# Patient Record
Sex: Female | Born: 1975
Health system: Southern US, Community
[De-identification: ages and names within clinical notes are randomized; demographics above are authoritative.]

## PROBLEM LIST (undated history)

## (undated) DIAGNOSIS — K5790 Diverticulosis of intestine, part unspecified, without perforation or abscess without bleeding: Secondary | ICD-10-CM

## (undated) DIAGNOSIS — I10 Essential (primary) hypertension: Secondary | ICD-10-CM

## (undated) DIAGNOSIS — K219 Gastro-esophageal reflux disease without esophagitis: Secondary | ICD-10-CM

## (undated) DIAGNOSIS — F4310 Post-traumatic stress disorder, unspecified: Secondary | ICD-10-CM

## (undated) DIAGNOSIS — M1990 Unspecified osteoarthritis, unspecified site: Secondary | ICD-10-CM

## (undated) DIAGNOSIS — R87629 Unspecified abnormal cytological findings in specimens from vagina: Secondary | ICD-10-CM

## (undated) DIAGNOSIS — E119 Type 2 diabetes mellitus without complications: Secondary | ICD-10-CM

## (undated) DIAGNOSIS — Z87440 Personal history of urinary (tract) infections: Secondary | ICD-10-CM

## (undated) DIAGNOSIS — K649 Unspecified hemorrhoids: Secondary | ICD-10-CM

## (undated) DIAGNOSIS — Z833 Family history of diabetes mellitus: Secondary | ICD-10-CM

## (undated) DIAGNOSIS — F329 Major depressive disorder, single episode, unspecified: Secondary | ICD-10-CM

## (undated) DIAGNOSIS — G4730 Sleep apnea, unspecified: Secondary | ICD-10-CM

## (undated) DIAGNOSIS — E669 Obesity, unspecified: Secondary | ICD-10-CM

## (undated) DIAGNOSIS — F419 Anxiety disorder, unspecified: Secondary | ICD-10-CM

## (undated) DIAGNOSIS — T7840XA Allergy, unspecified, initial encounter: Secondary | ICD-10-CM

## (undated) DIAGNOSIS — R7303 Prediabetes: Secondary | ICD-10-CM

## (undated) DIAGNOSIS — F32A Depression, unspecified: Secondary | ICD-10-CM

## (undated) HISTORY — DX: Family history of diabetes mellitus: Z83.3

## (undated) HISTORY — PX: SMALL INTESTINE SURGERY: SHX150

## (undated) HISTORY — DX: Major depressive disorder, single episode, unspecified: F32.9

## (undated) HISTORY — DX: Depression, unspecified: F32.A

## (undated) HISTORY — DX: Diverticulosis of intestine, part unspecified, without perforation or abscess without bleeding: K57.90

## (undated) HISTORY — DX: Obesity, unspecified: E66.9

## (undated) HISTORY — DX: Allergy, unspecified, initial encounter: T78.40XA

## (undated) HISTORY — DX: Anxiety disorder, unspecified: F41.9

## (undated) HISTORY — DX: Unspecified hemorrhoids: K64.9

## (undated) HISTORY — PX: APPENDECTOMY: SHX54

## (undated) HISTORY — DX: Unspecified abnormal cytological findings in specimens from vagina: R87.629

## (undated) HISTORY — DX: Personal history of urinary (tract) infections: Z87.440

## (undated) HISTORY — PX: KNEE ARTHROSCOPY: SHX127

## (undated) HISTORY — DX: Gastro-esophageal reflux disease without esophagitis: K21.9

---

## 2001-08-16 ENCOUNTER — Other Ambulatory Visit: Admission: RE | Admit: 2001-08-16 | Discharge: 2001-08-16 | Payer: Self-pay | Admitting: Internal Medicine

## 2003-03-22 ENCOUNTER — Ambulatory Visit (HOSPITAL_COMMUNITY): Admission: RE | Admit: 2003-03-22 | Discharge: 2003-03-22 | Payer: Self-pay | Admitting: Internal Medicine

## 2003-03-22 ENCOUNTER — Encounter: Payer: Self-pay | Admitting: Internal Medicine

## 2003-11-16 DIAGNOSIS — A159 Respiratory tuberculosis unspecified: Secondary | ICD-10-CM

## 2003-11-16 HISTORY — DX: Respiratory tuberculosis unspecified: A15.9

## 2004-12-31 ENCOUNTER — Ambulatory Visit: Payer: Self-pay | Admitting: Family Medicine

## 2005-02-17 ENCOUNTER — Ambulatory Visit: Payer: Self-pay | Admitting: Family Medicine

## 2005-10-05 ENCOUNTER — Ambulatory Visit: Payer: Self-pay | Admitting: Family Medicine

## 2005-10-11 ENCOUNTER — Ambulatory Visit (HOSPITAL_COMMUNITY): Admission: RE | Admit: 2005-10-11 | Discharge: 2005-10-11 | Payer: Self-pay | Admitting: Family Medicine

## 2005-10-11 IMAGING — CR DG KNEE COMPLETE 4+V*L*
4 series · 4 of 4 positions shown · non-contrast
Comparison: none

HISTORY: Pain and swelling, popping one week ago

LEFT KNEE 4 VIEWS:
Minimal medial compartment joint space narrowing.
Question joint effusion.
No fracture or dislocation.
No bone destruction or other regional soft tissue abnormality.

[view not recorded (1 of 4)]
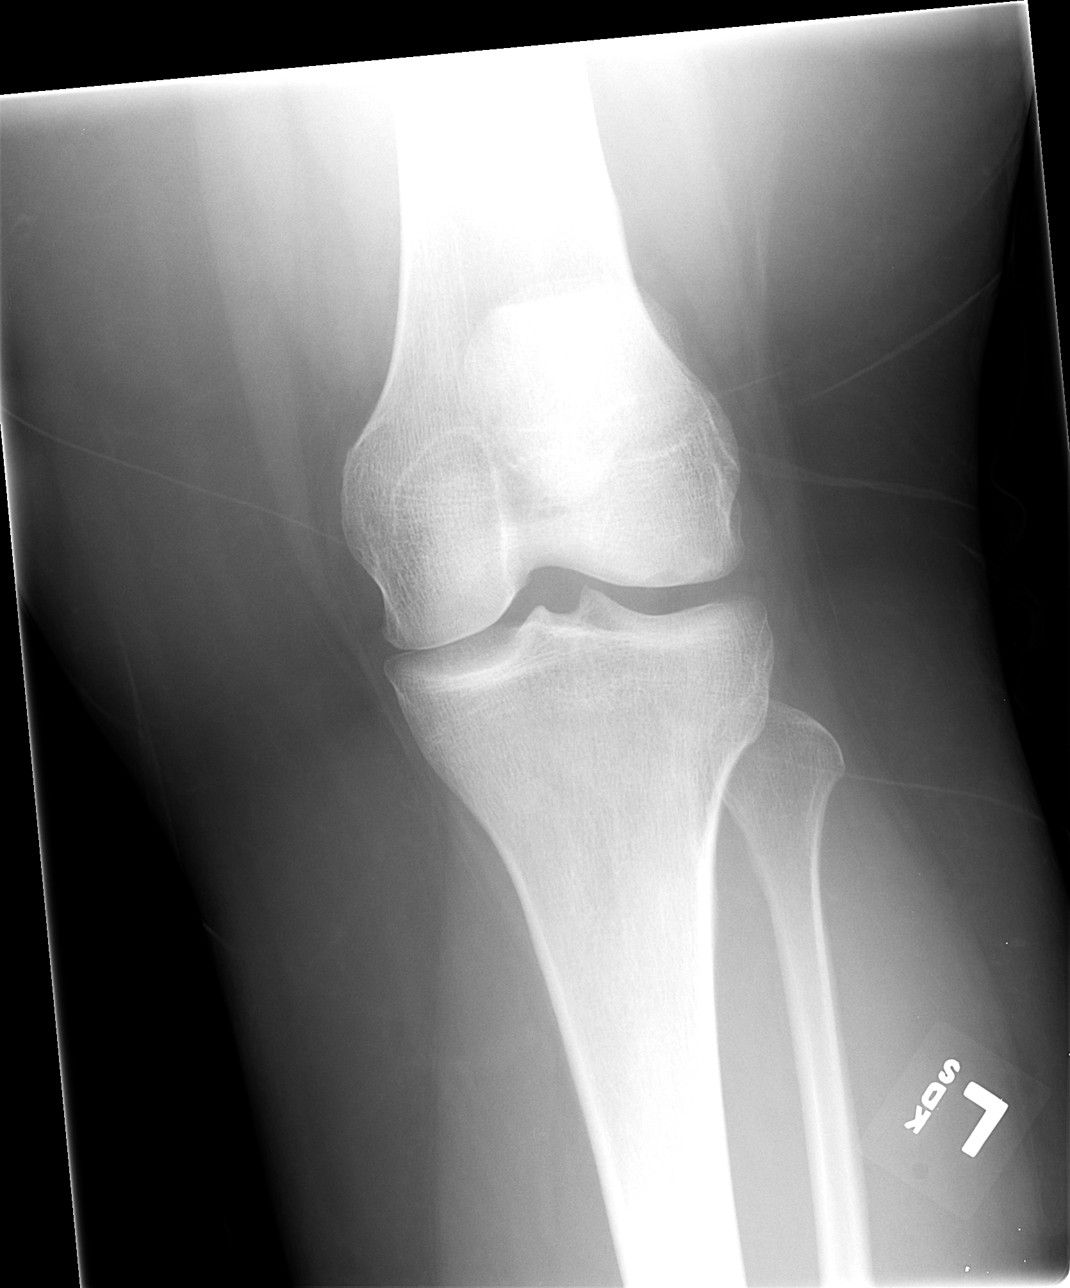

[view not recorded (2 of 4)]
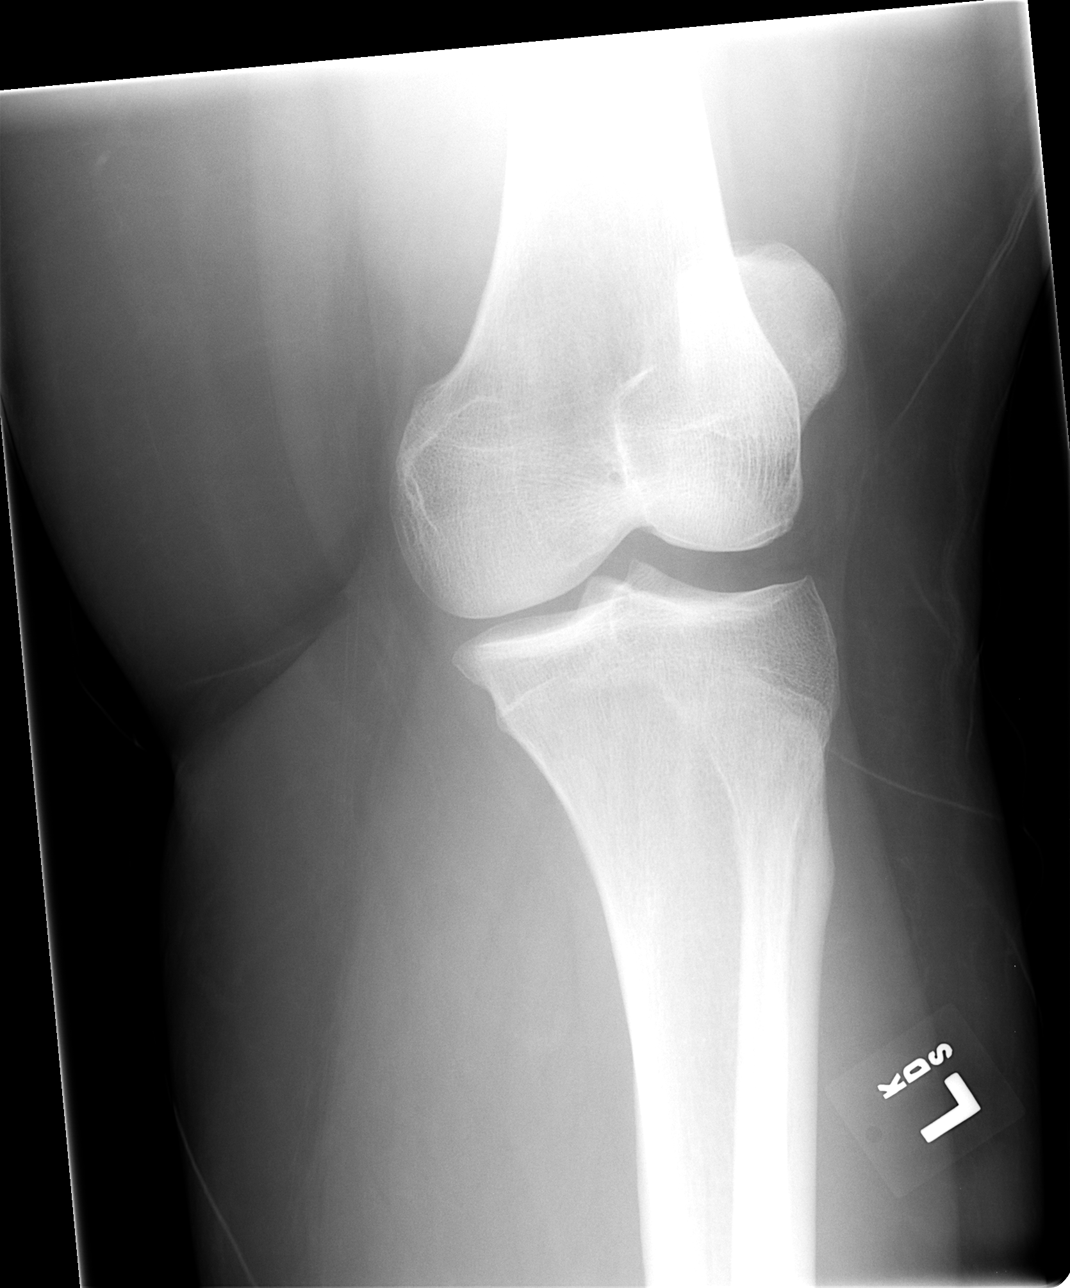

[view not recorded (3 of 4)]
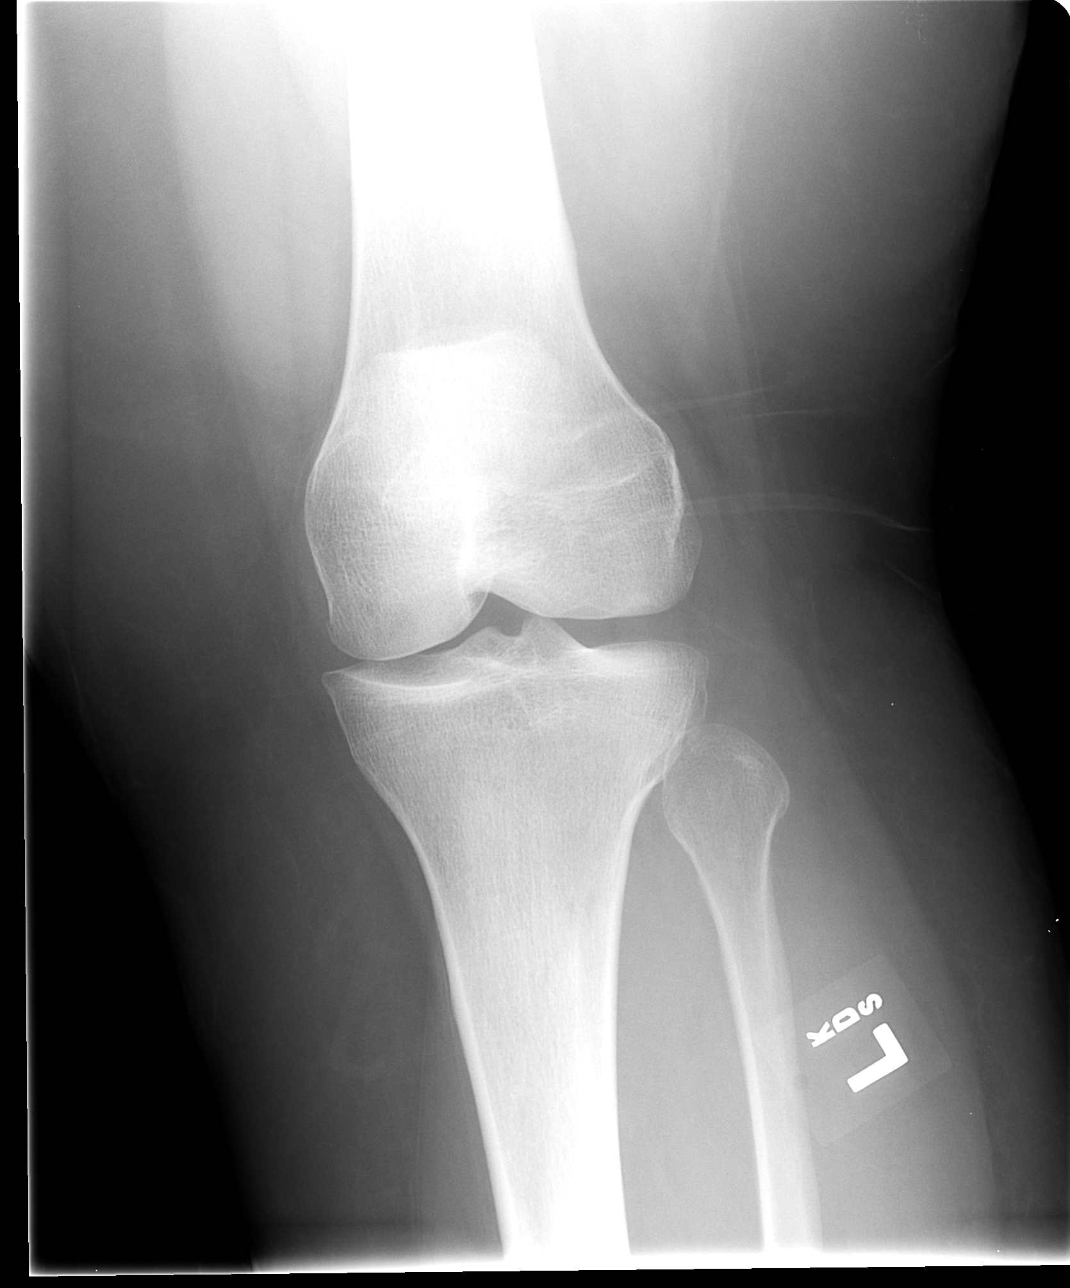

[view not recorded (4 of 4)]
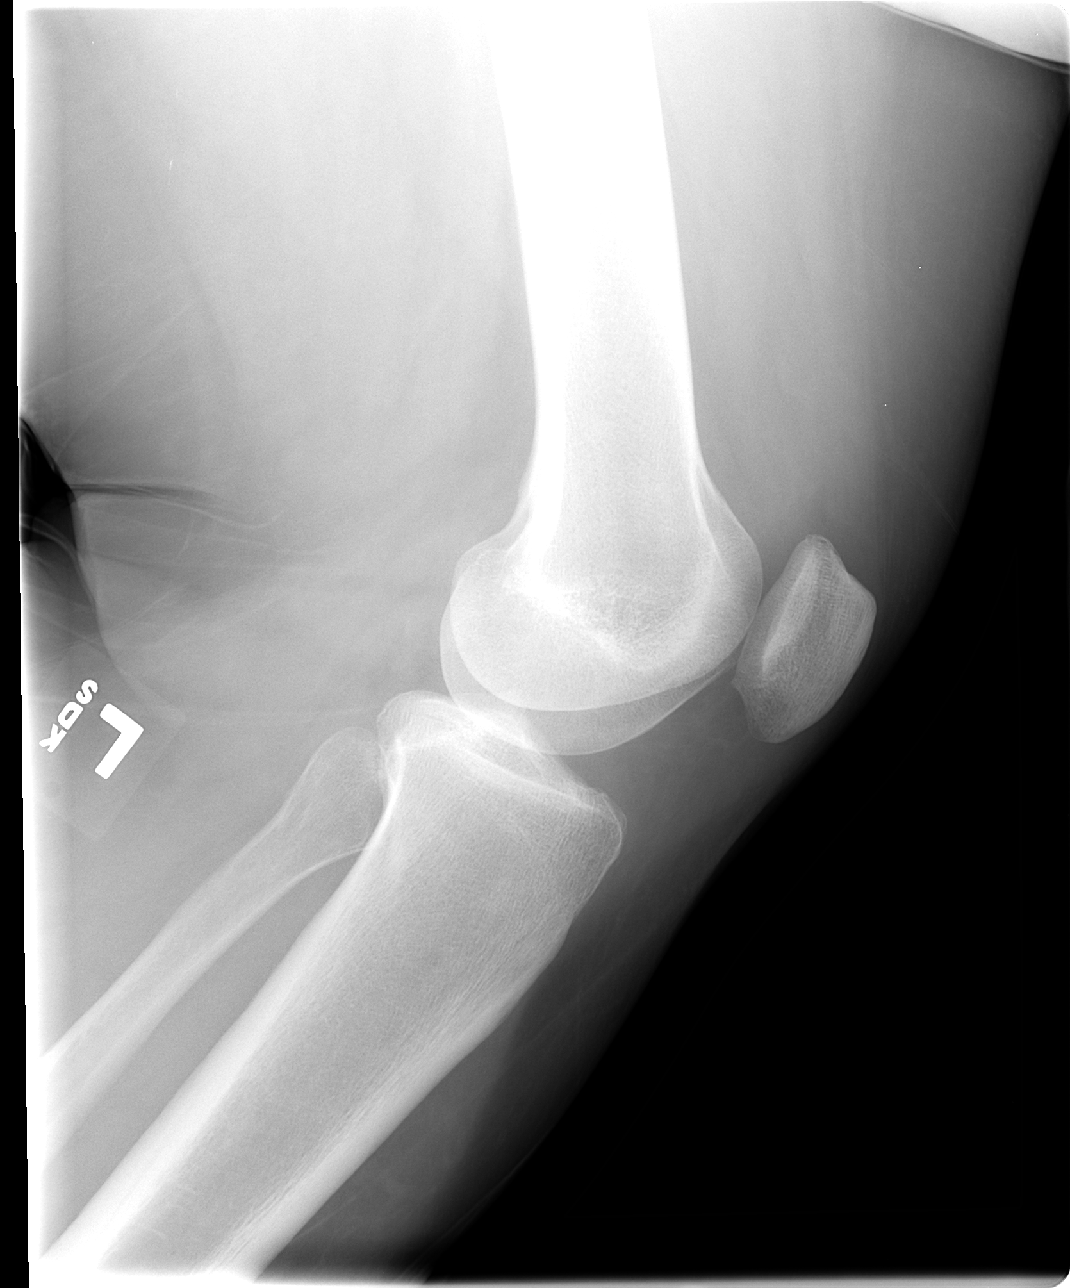

[4 of 4 positions shown; findings below may reference images not displayed]

IMPRESSION: Mild joint space narrowing medial compartment and question small associated
joint effusion.

## 2005-12-01 ENCOUNTER — Ambulatory Visit: Payer: Self-pay | Admitting: Family Medicine

## 2006-01-31 ENCOUNTER — Ambulatory Visit: Payer: Self-pay | Admitting: Family Medicine

## 2006-01-31 ENCOUNTER — Other Ambulatory Visit: Admission: RE | Admit: 2006-01-31 | Discharge: 2006-01-31 | Payer: Self-pay | Admitting: Pediatrics

## 2006-01-31 LAB — CONVERTED CEMR LAB: Pap Smear: NORMAL

## 2006-06-20 ENCOUNTER — Ambulatory Visit: Payer: Self-pay | Admitting: Family Medicine

## 2007-01-04 ENCOUNTER — Ambulatory Visit: Payer: Self-pay | Admitting: Family Medicine

## 2007-01-05 ENCOUNTER — Encounter: Payer: Self-pay | Admitting: Family Medicine

## 2007-01-05 LAB — CONVERTED CEMR LAB
Chlamydia, DNA Probe: NEGATIVE
GC Probe Amp, Genital: NEGATIVE

## 2007-01-06 ENCOUNTER — Encounter: Payer: Self-pay | Admitting: Family Medicine

## 2007-01-06 LAB — CONVERTED CEMR LAB
Candida species: NEGATIVE
Gardnerella vaginalis: NEGATIVE
Trichomonal Vaginitis: NEGATIVE

## 2007-02-07 ENCOUNTER — Other Ambulatory Visit: Admission: RE | Admit: 2007-02-07 | Discharge: 2007-02-07 | Payer: Self-pay | Admitting: Family Medicine

## 2007-02-07 ENCOUNTER — Encounter: Payer: Self-pay | Admitting: Family Medicine

## 2007-02-07 ENCOUNTER — Ambulatory Visit: Payer: Self-pay | Admitting: Family Medicine

## 2007-02-08 ENCOUNTER — Encounter: Payer: Self-pay | Admitting: Family Medicine

## 2007-02-08 LAB — CONVERTED CEMR LAB
Chlamydia, DNA Probe: NEGATIVE
GC Probe Amp, Genital: NEGATIVE
Pap Smear: NORMAL

## 2007-02-09 ENCOUNTER — Encounter: Payer: Self-pay | Admitting: Family Medicine

## 2007-02-09 LAB — CONVERTED CEMR LAB
Candida species: NEGATIVE
Gardnerella vaginalis: NEGATIVE
Trichomonal Vaginitis: NEGATIVE

## 2007-04-21 ENCOUNTER — Ambulatory Visit: Payer: Self-pay | Admitting: Family Medicine

## 2007-04-21 LAB — CONVERTED CEMR LAB
BUN: 8 mg/dL (ref 6–23)
Basophils Absolute: 0 10*3/uL (ref 0.0–0.1)
Basophils Relative: 1 % (ref 0–1)
CO2: 27 meq/L (ref 19–32)
Calcium: 9.1 mg/dL (ref 8.4–10.5)
Chloride: 103 meq/L (ref 96–112)
Cholesterol: 142 mg/dL (ref 0–200)
Creatinine, Ser: 0.79 mg/dL (ref 0.40–1.20)
Eosinophils Absolute: 0.3 10*3/uL (ref 0.0–0.7)
Eosinophils Relative: 4 % (ref 0–5)
Glucose, Bld: 77 mg/dL (ref 70–99)
HCT: 40.6 % (ref 36.0–46.0)
HDL: 59 mg/dL (ref >39)
Hemoglobin: 13.3 g/dL (ref 12.0–15.0)
LDL Cholesterol: 74 mg/dL (ref 0–99)
Lymphocytes Relative: 35 % (ref 12–46)
Lymphs Abs: 2.6 10*3/uL (ref 0.7–3.3)
MCHC: 32.8 g/dL (ref 30.0–36.0)
MCV: 92.1 fL (ref 78.0–100.0)
Monocytes Absolute: 0.5 10*3/uL (ref 0.2–0.7)
Monocytes Relative: 8 % (ref 3–11)
Neutro Abs: 3.8 10*3/uL (ref 1.7–7.7)
Neutrophils Relative %: 53 % (ref 43–77)
Platelets: 323 10*3/uL (ref 150–400)
Potassium: 4.1 meq/L (ref 3.5–5.3)
RBC: 4.41 M/uL (ref 3.87–5.11)
RDW: 14.1 % — ABNORMAL HIGH (ref 11.5–14.0)
Sodium: 141 meq/L (ref 135–145)
TSH: 1.276 microintl units/mL (ref 0.350–5.50)
Total CHOL/HDL Ratio: 2.4
Triglycerides: 43 mg/dL (ref <150)
VLDL: 9 mg/dL (ref 0–40)
WBC: 7.2 10*3/uL (ref 4.0–10.5)

## 2007-08-18 ENCOUNTER — Ambulatory Visit: Payer: Self-pay | Admitting: Family Medicine

## 2007-08-18 LAB — CONVERTED CEMR LAB
Chlamydia, DNA Probe: NEGATIVE
GC Probe Amp, Genital: NEGATIVE

## 2007-08-19 ENCOUNTER — Encounter: Payer: Self-pay | Admitting: Family Medicine

## 2007-08-19 LAB — CONVERTED CEMR LAB
Candida species: NEGATIVE
Gardnerella vaginalis: NEGATIVE
Trichomonal Vaginitis: NEGATIVE

## 2007-08-22 ENCOUNTER — Encounter: Payer: Self-pay | Admitting: Family Medicine

## 2007-10-19 ENCOUNTER — Ambulatory Visit: Payer: Self-pay | Admitting: Family Medicine

## 2007-10-20 ENCOUNTER — Encounter: Payer: Self-pay | Admitting: Family Medicine

## 2007-11-20 ENCOUNTER — Encounter: Payer: Self-pay | Admitting: Family Medicine

## 2007-11-20 LAB — CONVERTED CEMR LAB: Pap Smear: NORMAL

## 2007-11-24 ENCOUNTER — Encounter: Payer: Self-pay | Admitting: Family Medicine

## 2007-11-24 DIAGNOSIS — F418 Other specified anxiety disorders: Secondary | ICD-10-CM | POA: Insufficient documentation

## 2007-11-24 DIAGNOSIS — N390 Urinary tract infection, site not specified: Secondary | ICD-10-CM | POA: Insufficient documentation

## 2008-01-25 ENCOUNTER — Ambulatory Visit: Payer: Self-pay | Admitting: Family Medicine

## 2008-01-27 ENCOUNTER — Encounter: Payer: Self-pay | Admitting: Family Medicine

## 2008-01-27 LAB — CONVERTED CEMR LAB
Candida species: NEGATIVE
Chlamydia, DNA Probe: NEGATIVE
GC Probe Amp, Genital: NEGATIVE
Gardnerella vaginalis: NEGATIVE
Trichomonal Vaginitis: NEGATIVE

## 2008-05-20 ENCOUNTER — Ambulatory Visit: Payer: Self-pay | Admitting: Family Medicine

## 2008-05-21 ENCOUNTER — Encounter: Payer: Self-pay | Admitting: Family Medicine

## 2008-05-21 LAB — CONVERTED CEMR LAB
Chlamydia, DNA Probe: NEGATIVE
GC Probe Amp, Genital: NEGATIVE

## 2008-05-22 ENCOUNTER — Encounter: Payer: Self-pay | Admitting: Family Medicine

## 2008-05-22 LAB — CONVERTED CEMR LAB
Candida species: NEGATIVE
Gardnerella vaginalis: POSITIVE — AB
Trichomonal Vaginitis: NEGATIVE

## 2008-07-12 ENCOUNTER — Telehealth: Payer: Self-pay | Admitting: Family Medicine

## 2008-09-16 ENCOUNTER — Ambulatory Visit: Payer: Self-pay | Admitting: Family Medicine

## 2008-09-16 DIAGNOSIS — N3000 Acute cystitis without hematuria: Secondary | ICD-10-CM | POA: Insufficient documentation

## 2008-09-16 LAB — CONVERTED CEMR LAB
ALT: 12 units/L (ref 0–35)
AST: 12 units/L (ref 0–37)
Albumin: 4 g/dL (ref 3.5–5.2)
Alkaline Phosphatase: 91 units/L (ref 39–117)
BUN: 5 mg/dL — ABNORMAL LOW (ref 6–23)
Basophils Absolute: 0 10*3/uL (ref 0.0–0.1)
Basophils Relative: 0 % (ref 0–1)
Bilirubin Urine: NEGATIVE
Bilirubin, Direct: 0.1 mg/dL (ref 0.0–0.3)
CO2: 23 meq/L (ref 19–32)
Calcium: 8.7 mg/dL (ref 8.4–10.5)
Chloride: 103 meq/L (ref 96–112)
Cholesterol: 140 mg/dL (ref 0–200)
Creatinine, Ser: 0.62 mg/dL (ref 0.40–1.20)
Eosinophils Absolute: 0.2 10*3/uL (ref 0.0–0.7)
Eosinophils Relative: 2 % (ref 0–5)
Glucose, Bld: 87 mg/dL (ref 70–99)
Glucose, Urine, Semiquant: NEGATIVE
HCT: 41.5 % (ref 36.0–46.0)
HDL: 73 mg/dL (ref >39)
Hemoglobin: 13.3 g/dL (ref 12.0–15.0)
Indirect Bilirubin: 0.5 mg/dL (ref 0.0–0.9)
Ketones, urine, test strip: NEGATIVE
LDL Cholesterol: 59 mg/dL (ref 0–99)
Lymphocytes Relative: 31 % (ref 12–46)
Lymphs Abs: 2.6 10*3/uL (ref 0.7–4.0)
MCHC: 32 g/dL (ref 30.0–36.0)
MCV: 92.4 fL (ref 78.0–100.0)
Monocytes Absolute: 0.5 10*3/uL (ref 0.1–1.0)
Monocytes Relative: 6 % (ref 3–12)
Neutro Abs: 5.1 10*3/uL (ref 1.7–7.7)
Neutrophils Relative %: 61 % (ref 43–77)
Nitrite: NEGATIVE
Platelets: 301 10*3/uL (ref 150–400)
Potassium: 3.9 meq/L (ref 3.5–5.3)
Protein, U semiquant: NEGATIVE
RBC: 4.49 M/uL (ref 3.87–5.11)
RDW: 14.2 % (ref 11.5–15.5)
Sodium: 142 meq/L (ref 135–145)
Specific Gravity, Urine: 1.015
Total Bilirubin: 0.6 mg/dL (ref 0.3–1.2)
Total CHOL/HDL Ratio: 1.9
Total Protein: 6.9 g/dL (ref 6.0–8.3)
Triglycerides: 38 mg/dL (ref <150)
Urobilinogen, UA: 0.2
VLDL: 8 mg/dL (ref 0–40)
WBC: 8.3 10*3/uL (ref 4.0–10.5)
pH: 7

## 2008-09-17 ENCOUNTER — Encounter: Payer: Self-pay | Admitting: Family Medicine

## 2008-09-17 LAB — CONVERTED CEMR LAB
Chlamydia, DNA Probe: NEGATIVE
GC Probe Amp, Genital: NEGATIVE

## 2008-09-20 LAB — CONVERTED CEMR LAB
Candida species: NEGATIVE
Gardnerella vaginalis: POSITIVE — AB
Trichomonal Vaginitis: NEGATIVE

## 2009-01-30 ENCOUNTER — Encounter: Payer: Self-pay | Admitting: Family Medicine

## 2009-01-30 ENCOUNTER — Other Ambulatory Visit: Admission: RE | Admit: 2009-01-30 | Discharge: 2009-01-30 | Payer: Self-pay | Admitting: Family Medicine

## 2009-01-30 ENCOUNTER — Ambulatory Visit: Payer: Self-pay | Admitting: Family Medicine

## 2009-01-30 LAB — CONVERTED CEMR LAB

## 2009-01-31 ENCOUNTER — Encounter: Payer: Self-pay | Admitting: Family Medicine

## 2009-01-31 LAB — CONVERTED CEMR LAB
Chlamydia, DNA Probe: NEGATIVE
GC Probe Amp, Genital: NEGATIVE

## 2009-02-01 ENCOUNTER — Encounter: Payer: Self-pay | Admitting: Family Medicine

## 2009-02-03 LAB — CONVERTED CEMR LAB
Candida species: NEGATIVE
Gardnerella vaginalis: POSITIVE — AB
Trichomonal Vaginitis: NEGATIVE

## 2009-02-13 ENCOUNTER — Telehealth: Payer: Self-pay | Admitting: Family Medicine

## 2009-02-17 ENCOUNTER — Telehealth: Payer: Self-pay | Admitting: Family Medicine

## 2009-04-02 ENCOUNTER — Ambulatory Visit: Payer: Self-pay | Admitting: Family Medicine

## 2009-04-06 DIAGNOSIS — G4700 Insomnia, unspecified: Secondary | ICD-10-CM | POA: Insufficient documentation

## 2009-04-25 ENCOUNTER — Encounter: Payer: Self-pay | Admitting: Family Medicine

## 2009-05-27 ENCOUNTER — Telehealth: Payer: Self-pay | Admitting: Family Medicine

## 2009-07-22 ENCOUNTER — Telehealth: Payer: Self-pay | Admitting: Family Medicine

## 2009-07-23 ENCOUNTER — Ambulatory Visit: Payer: Self-pay | Admitting: Family Medicine

## 2009-07-23 DIAGNOSIS — L0390 Cellulitis, unspecified: Secondary | ICD-10-CM

## 2009-07-23 DIAGNOSIS — L0291 Cutaneous abscess, unspecified: Secondary | ICD-10-CM | POA: Insufficient documentation

## 2009-07-23 DIAGNOSIS — R5382 Chronic fatigue, unspecified: Secondary | ICD-10-CM | POA: Insufficient documentation

## 2009-07-25 ENCOUNTER — Telehealth: Payer: Self-pay | Admitting: Family Medicine

## 2009-11-17 ENCOUNTER — Ambulatory Visit: Payer: Self-pay | Admitting: Family Medicine

## 2009-11-17 ENCOUNTER — Encounter (INDEPENDENT_AMBULATORY_CARE_PROVIDER_SITE_OTHER): Payer: Self-pay

## 2009-11-17 DIAGNOSIS — N739 Female pelvic inflammatory disease, unspecified: Secondary | ICD-10-CM | POA: Insufficient documentation

## 2009-11-17 DIAGNOSIS — N760 Acute vaginitis: Secondary | ICD-10-CM | POA: Insufficient documentation

## 2009-11-17 LAB — CONVERTED CEMR LAB
BUN: 8 mg/dL (ref 6–23)
Basophils Absolute: 0 10*3/uL (ref 0.0–0.1)
Basophils Relative: 0 % (ref 0–1)
CO2: 26 meq/L (ref 19–32)
Calcium: 9.2 mg/dL (ref 8.4–10.5)
Chloride: 102 meq/L (ref 96–112)
Cholesterol: 113 mg/dL (ref 0–200)
Creatinine, Ser: 0.67 mg/dL (ref 0.40–1.20)
Eosinophils Absolute: 0.1 10*3/uL (ref 0.0–0.7)
Eosinophils Relative: 1 % (ref 0–5)
Glucose, Bld: 89 mg/dL (ref 70–99)
Glucose, Urine, Semiquant: NEGATIVE
HCT: 40.7 % (ref 36.0–46.0)
HDL: 46 mg/dL (ref >39)
Hemoglobin: 13.6 g/dL (ref 12.0–15.0)
LDL Cholesterol: 57 mg/dL (ref 0–99)
Lymphocytes Relative: 28 % (ref 12–46)
Lymphs Abs: 1.5 10*3/uL (ref 0.7–4.0)
MCHC: 33.3 g/dL (ref 30.0–36.0)
MCV: 91.5 fL (ref 78.0–100.0)
Monocytes Absolute: 0.7 10*3/uL (ref 0.1–1.0)
Monocytes Relative: 13 % — ABNORMAL HIGH (ref 3–12)
Neutro Abs: 3 10*3/uL (ref 1.7–7.7)
Neutrophils Relative %: 58 % (ref 43–77)
Nitrite: NEGATIVE
Platelets: 325 10*3/uL (ref 150–400)
Potassium: 3.7 meq/L (ref 3.5–5.3)
Protein, U semiquant: 100
RBC: 4.45 M/uL (ref 3.87–5.11)
RDW: 14.4 % (ref 11.5–15.5)
Sodium: 138 meq/L (ref 135–145)
Specific Gravity, Urine: >=1.03
TSH: 1.544 microintl units/mL (ref 0.350–4.500)
Total CHOL/HDL Ratio: 2.5
Triglycerides: 49 mg/dL (ref <150)
Urobilinogen, UA: 0.2
VLDL: 10 mg/dL (ref 0–40)
WBC: 5.2 10*3/uL (ref 4.0–10.5)
pH: 6

## 2009-11-18 ENCOUNTER — Encounter: Payer: Self-pay | Admitting: Family Medicine

## 2009-11-18 LAB — CONVERTED CEMR LAB
Candida species: NEGATIVE
Chlamydia, DNA Probe: NEGATIVE
GC Probe Amp, Genital: NEGATIVE
Gardnerella vaginalis: NEGATIVE

## 2009-11-19 ENCOUNTER — Encounter (INDEPENDENT_AMBULATORY_CARE_PROVIDER_SITE_OTHER): Payer: Self-pay

## 2009-11-19 ENCOUNTER — Encounter: Payer: Self-pay | Admitting: Family Medicine

## 2009-11-19 ENCOUNTER — Telehealth: Payer: Self-pay | Admitting: Family Medicine

## 2009-11-19 LAB — CONVERTED CEMR LAB

## 2009-11-25 DIAGNOSIS — B369 Superficial mycosis, unspecified: Secondary | ICD-10-CM | POA: Insufficient documentation

## 2009-12-02 ENCOUNTER — Telehealth: Payer: Self-pay | Admitting: Family Medicine

## 2009-12-04 ENCOUNTER — Ambulatory Visit: Payer: Self-pay | Admitting: Family Medicine

## 2009-12-08 ENCOUNTER — Encounter: Payer: Self-pay | Admitting: Family Medicine

## 2009-12-08 ENCOUNTER — Telehealth: Payer: Self-pay | Admitting: Family Medicine

## 2009-12-08 LAB — CONVERTED CEMR LAB: hCG, Beta Chain, Quant, S: 85180.5 milliintl units/mL

## 2009-12-09 ENCOUNTER — Encounter: Payer: Self-pay | Admitting: Family Medicine

## 2009-12-09 ENCOUNTER — Telehealth: Payer: Self-pay | Admitting: Family Medicine

## 2010-03-23 ENCOUNTER — Ambulatory Visit: Payer: Self-pay | Admitting: Family Medicine

## 2010-03-23 DIAGNOSIS — J209 Acute bronchitis, unspecified: Secondary | ICD-10-CM | POA: Insufficient documentation

## 2010-06-09 ENCOUNTER — Telehealth: Payer: Self-pay | Admitting: Family Medicine

## 2010-06-22 ENCOUNTER — Ambulatory Visit: Payer: Self-pay | Admitting: Family Medicine

## 2010-06-26 ENCOUNTER — Encounter: Payer: Self-pay | Admitting: Family Medicine

## 2010-06-26 ENCOUNTER — Telehealth: Payer: Self-pay | Admitting: Family Medicine

## 2010-06-26 ENCOUNTER — Ambulatory Visit (HOSPITAL_COMMUNITY): Admission: RE | Admit: 2010-06-26 | Discharge: 2010-06-26 | Payer: Self-pay | Admitting: Obstetrics and Gynecology

## 2010-06-26 IMAGING — CR DG CHEST 1V
1 series · 1 of 1 positions shown · non-contrast
Comparison: None.

CLINICAL DATA: Positive PPD, 8 months pregnant, the patient was
double shielded

CHEST - 1 VIEW

[view not recorded]
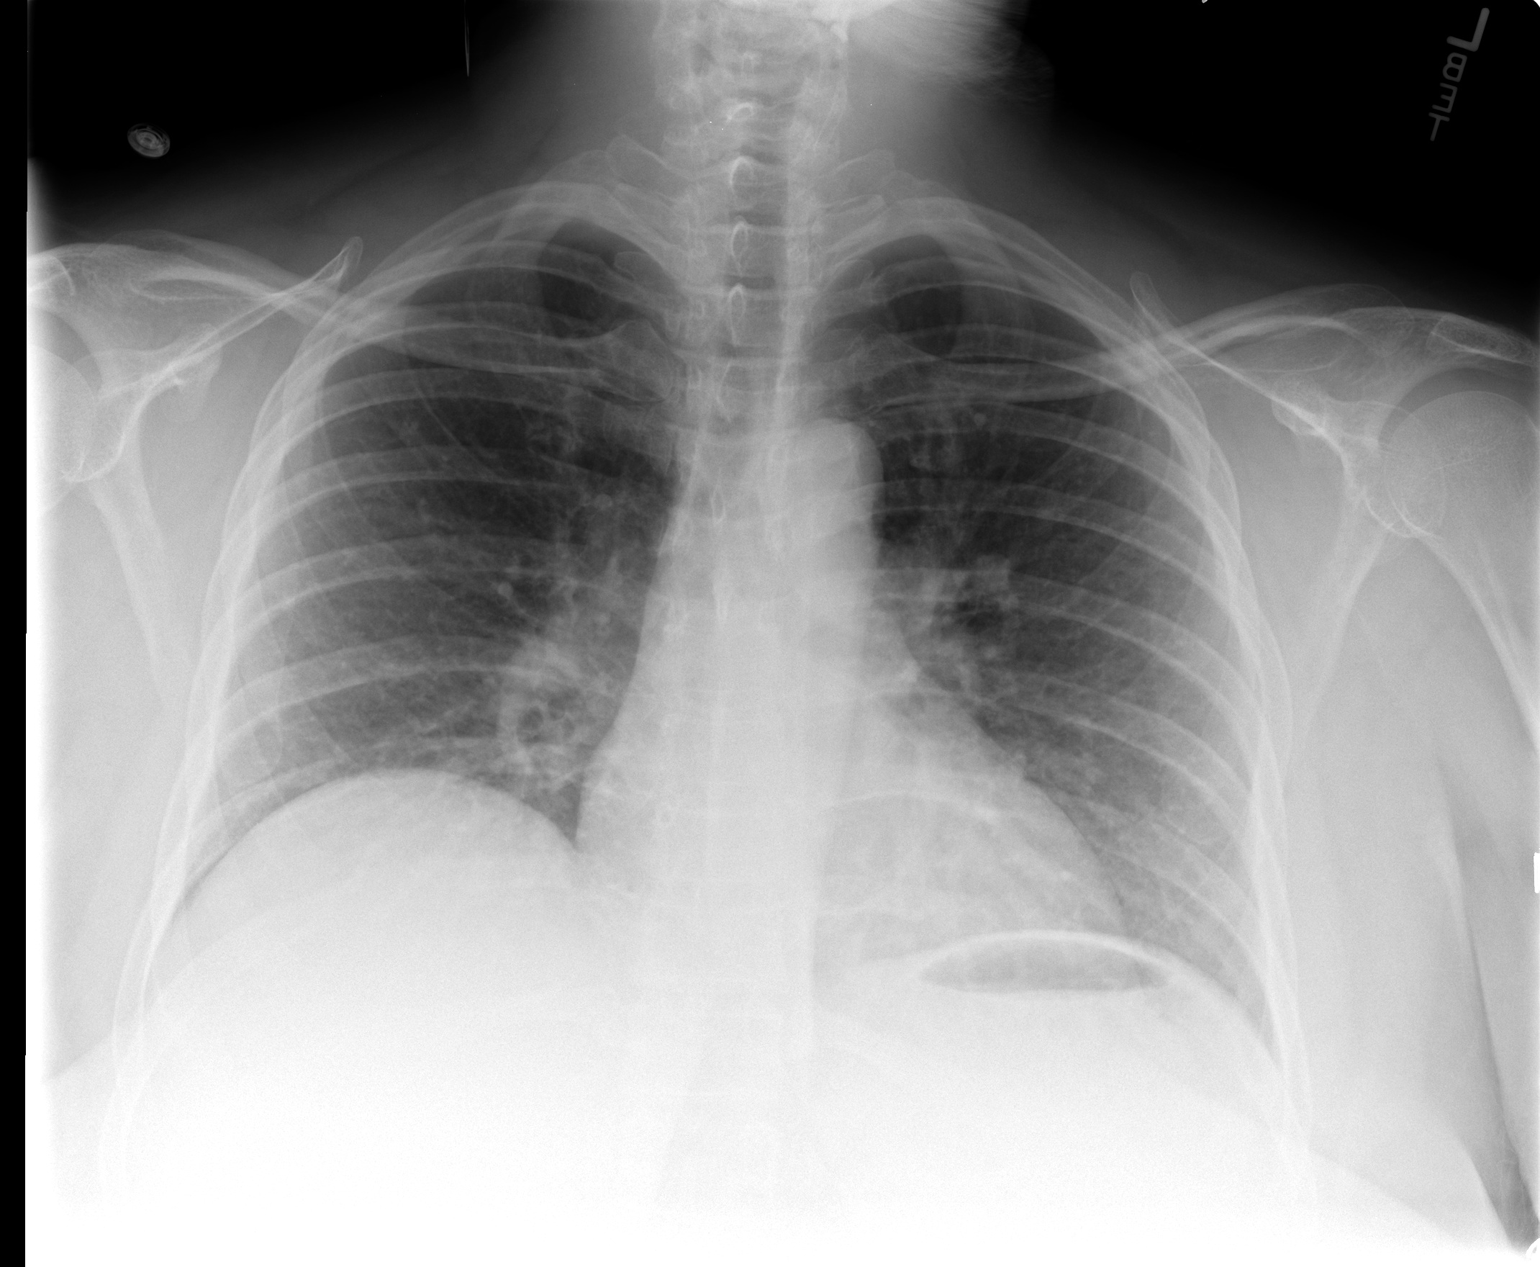

[1 of 1 positions shown; findings below may reference images not displayed]

FINDINGS: There is a questionable small nodule in the left upper
lobe overlying the anterior left first rib. This may represent a
small granuloma. Comparison with prior or follow-up chest x-ray is
recommended to assess stability. No active infiltrate or effusion
is seen.  The heart is within normal limits in size.  No bony
abnormality is seen.
IMPRESSION: No active lung disease.  No adenopathy.  Question of a small
granuloma in the left upper lung field.  Compare with prior or
follow-up chest x-ray to assess stability.

## 2010-06-29 ENCOUNTER — Telehealth: Payer: Self-pay | Admitting: Family Medicine

## 2010-07-07 ENCOUNTER — Telehealth: Payer: Self-pay | Admitting: Family Medicine

## 2010-07-14 ENCOUNTER — Telehealth: Payer: Self-pay | Admitting: Family Medicine

## 2010-07-16 ENCOUNTER — Telehealth: Payer: Self-pay | Admitting: Family Medicine

## 2010-07-16 ENCOUNTER — Encounter: Payer: Self-pay | Admitting: Family Medicine

## 2010-07-20 ENCOUNTER — Inpatient Hospital Stay (HOSPITAL_COMMUNITY): Admission: AD | Admit: 2010-07-20 | Discharge: 2010-07-22 | Payer: Self-pay | Admitting: Obstetrics and Gynecology

## 2010-10-28 ENCOUNTER — Telehealth: Payer: Self-pay | Admitting: Family Medicine

## 2010-11-02 ENCOUNTER — Ambulatory Visit (HOSPITAL_COMMUNITY)
Admission: RE | Admit: 2010-11-02 | Discharge: 2010-11-02 | Payer: Self-pay | Source: Home / Self Care | Attending: Pulmonary Disease | Admitting: Pulmonary Disease

## 2010-11-02 ENCOUNTER — Encounter: Payer: Self-pay | Admitting: Family Medicine

## 2010-11-02 ENCOUNTER — Ambulatory Visit: Payer: Self-pay | Admitting: Family Medicine

## 2010-11-02 DIAGNOSIS — L630 Alopecia (capitis) totalis: Secondary | ICD-10-CM | POA: Insufficient documentation

## 2010-11-02 DIAGNOSIS — A150 Tuberculosis of lung: Secondary | ICD-10-CM | POA: Insufficient documentation

## 2010-11-02 IMAGING — CR DG CHEST 2V
2 series · 2 of 2 positions shown · non-contrast
Comparison: [DATE]

CLINICAL DATA: Positive PPD.  Evaluate lungs.

CHEST - 2 VIEW

[view not recorded (1 of 2)]
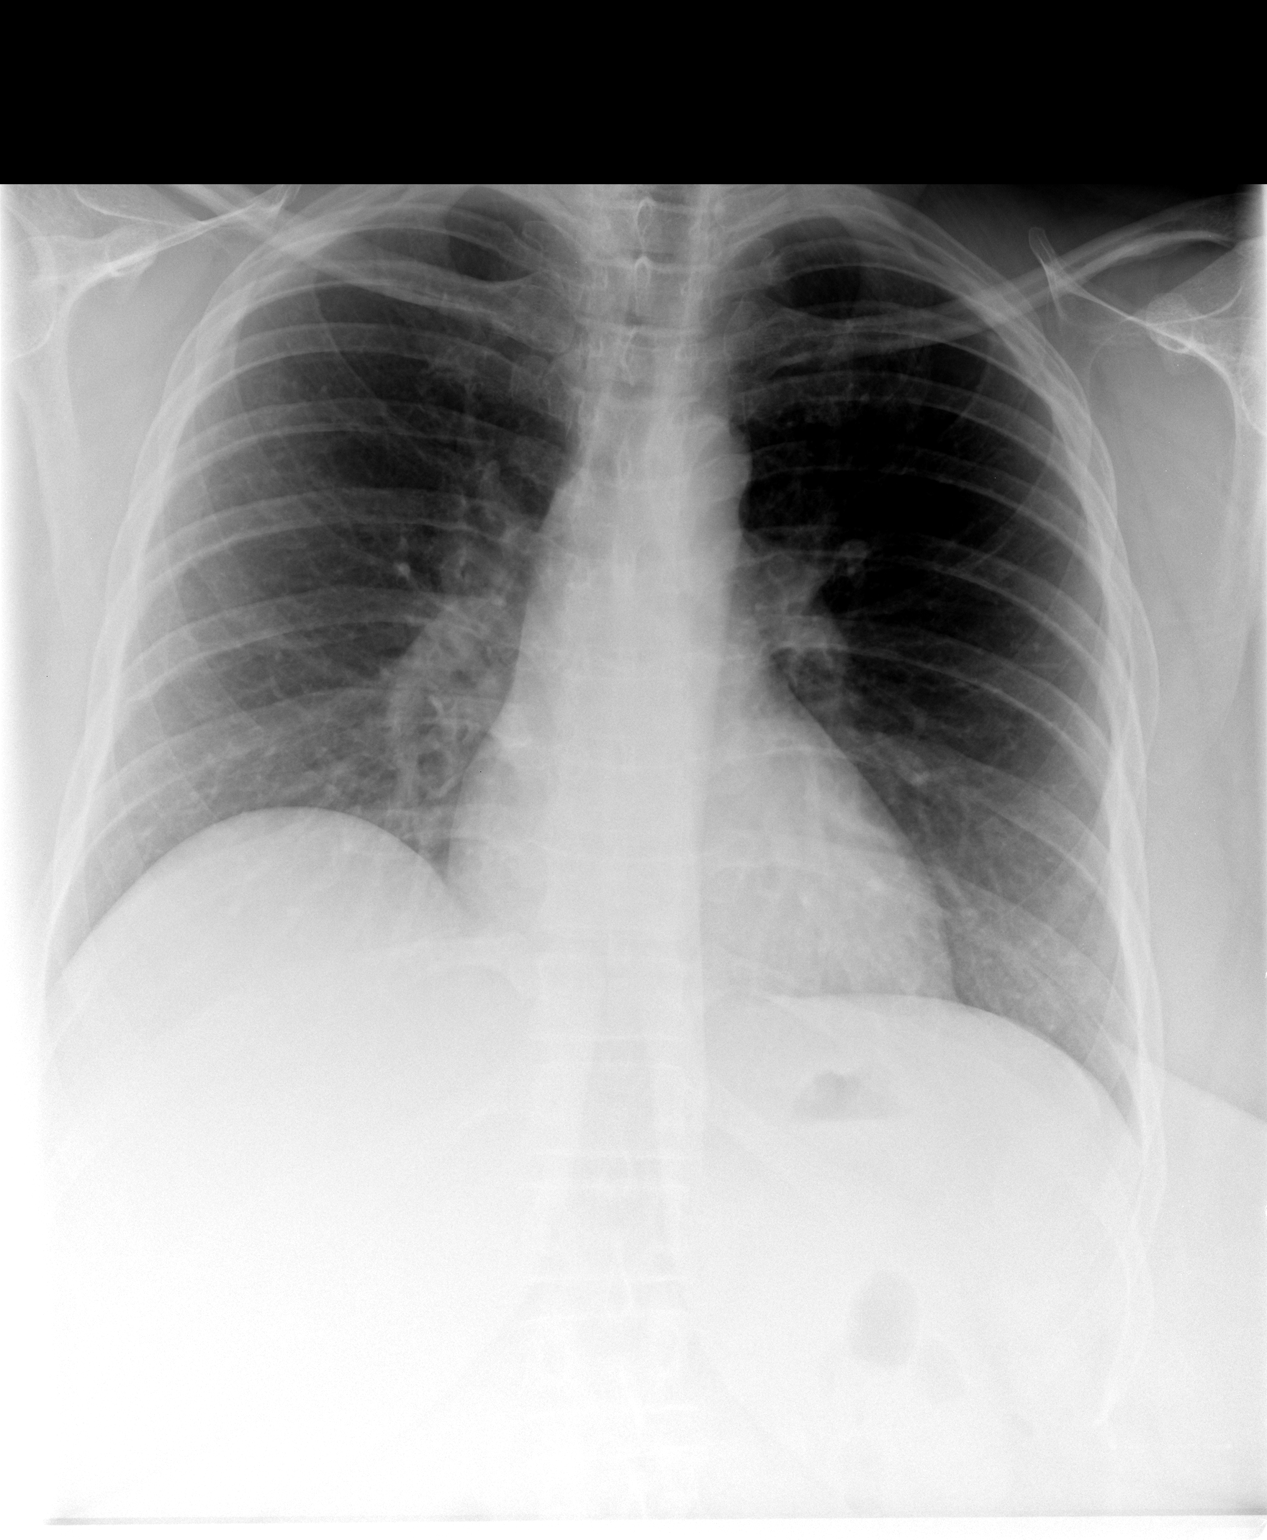

[view not recorded (2 of 2)]
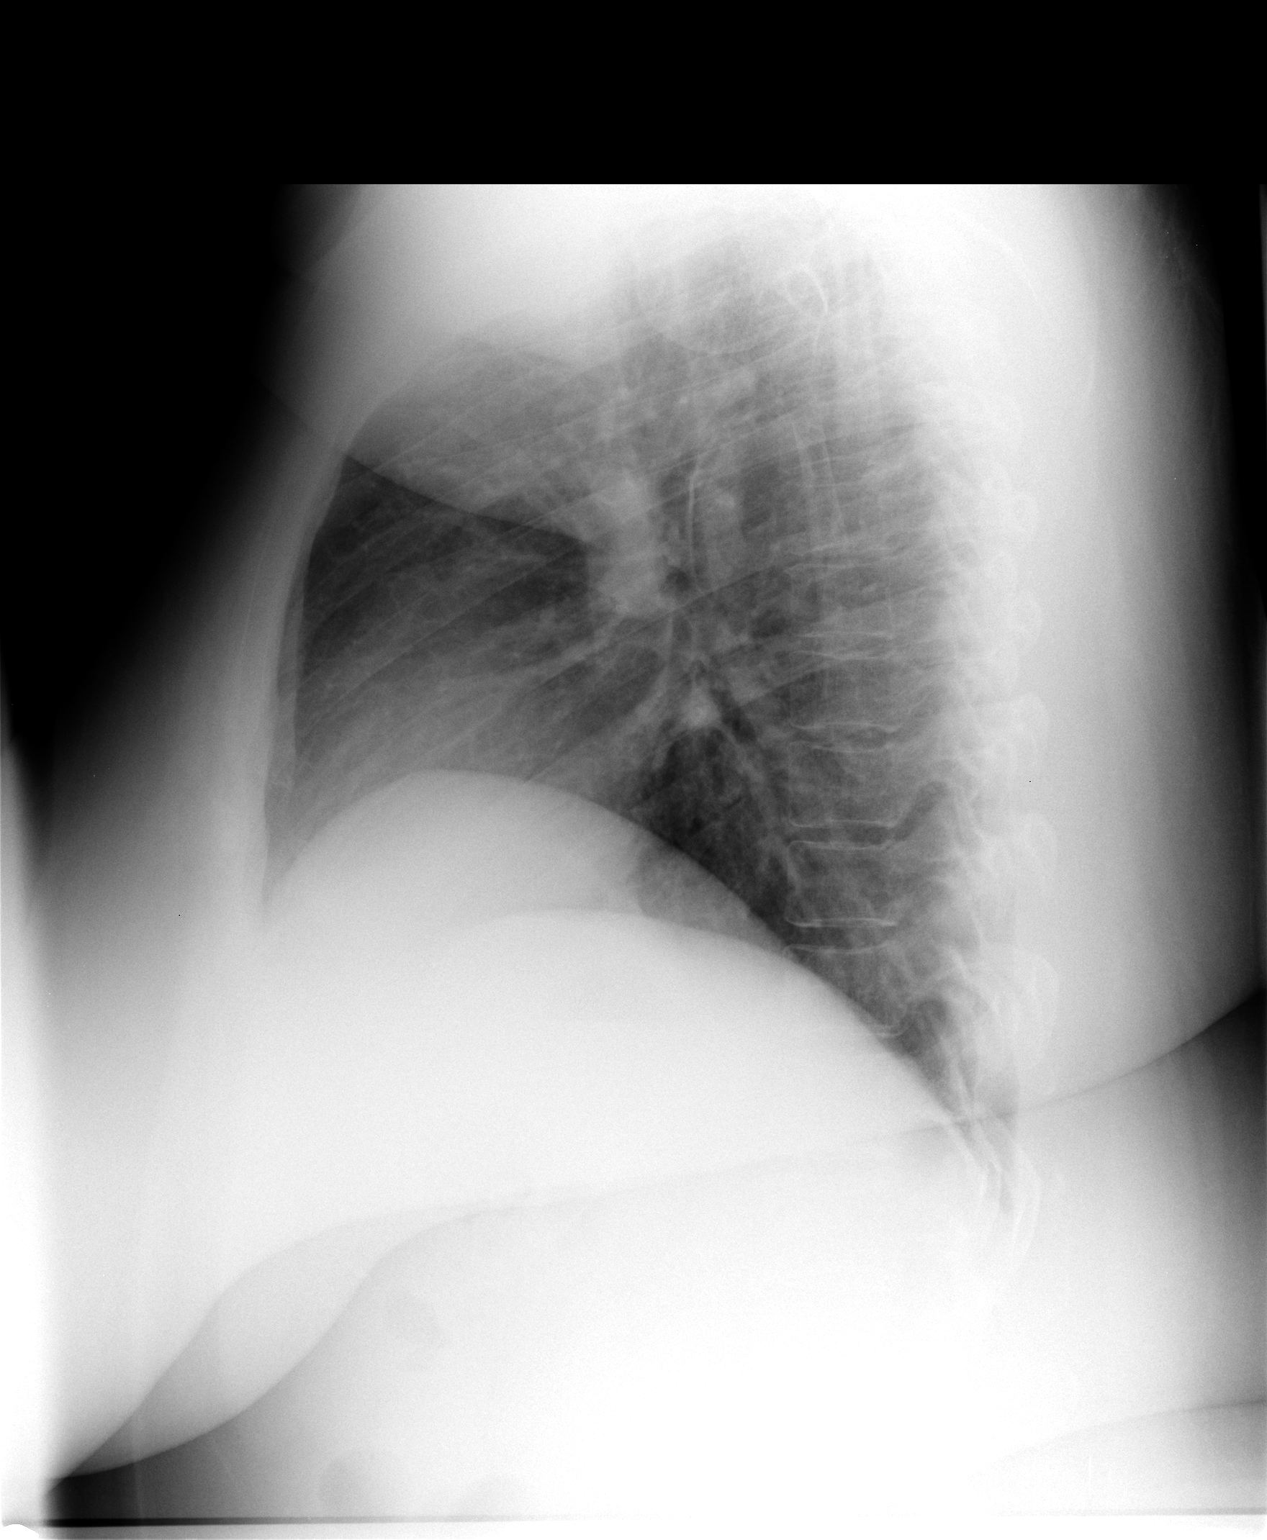

[2 of 2 positions shown; findings below may reference images not displayed]

FINDINGS: The lungs are clear without confluent airspace opacities,
edema or effusions.  Punctate calcifications are again seen in the
left upper lobe possibly related to prior granulomas disease.  No
cavitary lesions are seen today.  The heart is normal in size.  The
upper abdomen is normal.  The osseous structures are intact.
IMPRESSION: No acute cardiopulmonary disease.  Unchanged sequela of prior
granulomatous disease in the left upper lung.

## 2010-11-03 LAB — CONVERTED CEMR LAB: Hgb A1c MFr Bld: 6.1 % — ABNORMAL HIGH (ref <5.7)

## 2010-11-26 ENCOUNTER — Encounter: Payer: Self-pay | Admitting: Family Medicine

## 2010-12-15 NOTE — Letter (Signed)
Summary: Letter  Letter   Imported By: Rudene Anda 12/09/2009 09:17:00  _____________________________________________________________________  External Attachment:    Type:   Image     Comment:   External Document

## 2010-12-15 NOTE — Assessment & Plan Note (Signed)
Summary: nv  Nurse Visit   Allergies: No Known Drug Allergies  Immunizations Administered:  PPD Skin Test:    Vaccine Type: PPD    Site: right forearm    Mfr: Sanofi Pasteur    Dose: 0.1 ml    Route: ID    Given by: Adella Hare LPN    Exp. Date: 09/17/2011    Lot #: [id number]  Orders Added: 1)  TB Skin Test [86580] 2)  Admin 1st Vaccine [90471]  Appended Document: nv   PPD Results    Date of reading: 06/24/2010    Results: 5-57mm    Interpretation: positive spoke with [name] at health dept and states since patient has no symptoms, okay to wait until she delivers to have chest x ray, patient aware

## 2010-12-15 NOTE — Progress Notes (Signed)
Summary: CALL BACK  Phone Note Call from Patient   Summary of Call: NEEDS INFORMATION ON HER VISIT AND ABOUT GOING BACK TO WORK  CALL HER BACK AT [phone] OR CELL # [phone] Initial call taken by: Lind Guest,  November 19, 2009 8:09 AM  Follow-up for Phone Call        I see all her tests were normal. Is it ok for her to go back to work? Follow-up by: Everitt Amber,  November 19, 2009 9:59 AM  Additional Follow-up for Phone Call Additional follow up Details #1::        yes she can, i think she shuld re[pt the test for genital herpesw in 8 weeks, if the infection is very recent the test may be normal pls reorder if she agrees Additional Follow-up by: Syliva Overman MD,  November 19, 2009 11:14 AM    Additional Follow-up for Phone Call Additional follow up Details #2::    Aware to have the test repeated in 8 weeks.  States the pelvic pain is better but it still hurts really bad and she can't see herself working like that . Can her excuse be extended? Follow-up by: Everitt Amber,  November 19, 2009 11:23 AM  Additional Follow-up for Phone Call Additional follow up Details #3:: Details for Additional Follow-up Action Taken: pls refill the acyclovir x 1 , order RPR and hIV, now. meds have been sent in for depression she is habvvin generalised itching, she is aware.   pLs see if rpr and hiv can be added to recent lab so she does not have to come back now, if not she needs those now and the rept HSV test in 8 weeks..pls also extend wk excuse to return next Monday Additional Follow-up by: Syliva Overman MD,  November 19, 2009 11:36 AM  New/Updated Medications: BUPROPION HCL 150 MG XR12H-TAB (BUPROPION HCL) Take 1 tablet by mouth once a day Patient aware and work note faxed to her job  Prescriptions: ACYCLOVIR 400 MG TABS (ACYCLOVIR) Take 1 tablet by mouth three times a day  #30 x 0   Entered by:   Everitt Amber   Authorized by:   Syliva Overman MD   Signed by:   Everitt Amber on  11/19/2009   Method used:   Electronically to        [facility]  [address] (retail)       [address]       [place], [place]  [address]       Ph: [phone]       Fax: [phone]   RxID:   [id number] BUPROPION HCL 150 MG XR12H-TAB (BUPROPION HCL) Take 1 tablet by mouth once a day  #30 x 2   Entered and Authorized by:   Syliva Overman MD   Signed by:   Syliva Overman MD on 11/19/2009   Method used:   Electronically to        [facility]  [address] (retail)       [address]       [place], [place]  [address]       Ph: [phone]       Fax: [phone]   RxID:   [id number]

## 2010-12-15 NOTE — Progress Notes (Signed)
Summary: PAPER READY  Phone Note Call from Patient   Summary of Call: WAS WONDERING IF THE YELLOW PAPER IS READY  CALL BACK AT [phone] Initial call taken by: Lind Guest,  July 16, 2010 10:39 AM  Follow-up for Phone Call        Phone Call Completed Follow-up by: Adella Hare LPN,  July 16, 2010 11:38 AM

## 2010-12-15 NOTE — Progress Notes (Signed)
Summary: CALL BACK  Phone Note Call from Patient   Summary of Call: WANTS YOU TO CALL HER AT [phone] ABOUT HER LAST VISIT Initial call taken by: Lind Guest,  December 02, 2009 9:06 AM  Follow-up for Phone Call        patient has some questions about test results, also states she has a positive pregnancy test, patient coming in this week for a urine pregnancy test  Follow-up by: Worthy Keeler LPN,  December 02, 2009 9:29 AM

## 2010-12-15 NOTE — Progress Notes (Signed)
Summary: referral  Phone Note From Other Clinic   Caller: Becky Call For: Rosario Adie Summary of Call: Kriste Basque from Dr. Cherly Hensen office has been leaving message for Sindia to come in today and see dr. cousins. But Deshunda hasn't returned call yet. Becky just wanted our office to know they were trying to get her in office. Initial call taken by: Rudene Anda,  December 09, 2009 2:27 PM

## 2010-12-15 NOTE — Progress Notes (Signed)
Summary: speak with nurse  Phone Note Call from Patient   Summary of Call: pt would like to speak with jamie. [phone] Initial call taken by: Rudene Anda,  June 29, 2010 10:38 AM  Follow-up for Phone Call        patient states dr cousins states she needs to come here after baby for treatment regarding the chest xray and patient wants to know what to do about the health form from social services she brought by Follow-up by: Adella Hare LPN,  June 29, 2010 1:59 PM  Additional Follow-up for Phone Call Additional follow up Details #1::        spoke with billy at health dept and they will provide treatment after baby is born, took patient info and i will fax chest xray and billy will contact patient Additional Follow-up by: Adella Hare LPN,  June 29, 2010 2:14 PM    Additional Follow-up for Phone Call Additional follow up Details #2::    noted pls make pt aweare Follow-up by: Syliva Overman MD,  June 29, 2010 7:30 PM

## 2010-12-15 NOTE — Letter (Signed)
Summary: Out of Work  [facility]  [address]   [place], [place] [address]   Phone: [phone]  Fax: [phone]    November 17, 2009   Employee:  Jennifer [name]    To Whom It May Concern:   For Medical reasons, please excuse the above named employee from work for the following dates:  Start:   11/17/2009  End:   11/19/2009  If you need additional information, please feel free to contact our office.         Sincerely,    Milus Mallick. Lodema Hong, M.D.

## 2010-12-15 NOTE — Progress Notes (Signed)
Summary: yellow paper  Phone Note Call from Patient   Summary of Call: wants to speak with you about a yellow sheet of paper that she brought in call back at [phone] Initial call taken by: Lind Guest,  July 07, 2010 11:31 AM  Follow-up for Phone Call        advised available for pick up Follow-up by: Adella Hare LPN,  July 07, 2010 11:51 AM

## 2010-12-15 NOTE — Miscellaneous (Signed)
Summary: lab order  Clinical Lists Changes  Problems: Added new problem of SCREENING FOR LIPOID DISORDERS (ICD-V77.91) Orders: Added new Test order of T-Basic Metabolic Panel [phone]) - Signed Added new Test order of T-Lipid Profile [phone]) - Signed Added new Test order of T-CBC w/Diff [phone]) - Signed Added new Test order of T-TSH ([id number]) - Signed

## 2010-12-15 NOTE — Progress Notes (Signed)
Summary: call  Phone Note Call from Patient   Summary of Call: wants you to call  her at [phone] Initial call taken by: Lind Guest,  December 08, 2009 8:40 AM  Follow-up for Phone Call        vaginal burning and another ulcer has appeared, states she hasnt gotten an appt with dr cousins yet. what should she do? Follow-up by: Worthy Keeler LPN,  December 08, 2009 10:47 AM  Additional Follow-up for Phone Call Additional follow up Details #1::        advise her we will directly contact Dr. Cherly Hensen office and try to get her in asap , let them know what's going on, pls print the labs and give them to referrals, tests for hsv2, quant hcg andswabs  I will send a noite and the hCG report an ask referrals to urgently f/u on this Additional Follow-up by: Syliva Overman MD,  December 08, 2009 6:11 PM    Additional Follow-up for Phone Call Additional follow up Details #2::    patient aware Follow-up by: Worthy Keeler LPN,  December 09, 2009 11:10 AM  Additional Follow-up for Phone Call Additional follow up Details #3:: Details for Additional Follow-up Action Taken: I called and spoke with Kriste Basque at dr. cousins office. Also faxed over all notes to them and as soon as they make pt appt. Kriste Basque stated she would call pt and office and give the date and time. also called pt and left message for pt to call office. Additional Follow-up by: Rudene Anda,  December 09, 2009 12:08 PM

## 2010-12-15 NOTE — Letter (Signed)
Summary: Letter  Letter   Imported By: Rudene Anda 12/25/2009 12:34:39  _____________________________________________________________________  External Attachment:    Type:   Image     Comment:   External Document

## 2010-12-15 NOTE — Letter (Signed)
Summary: Out of Work  [facility]  [address]   [place], [place] [address]   Phone: [phone]  Fax: [phone]    November 19, 2009   Employee:  Jennifer [name]    To Whom It May Concern:   For Medical reasons, please excuse the above named employee from work for the following dates:  Start:   11/17/2009  End:   11/24/2009  If you need additional information, please feel free to contact our office.         Sincerely,    Maragaret E. Lodema Hong, M.D.

## 2010-12-15 NOTE — Assessment & Plan Note (Signed)
Summary: PREG TEST  Nurse Visit  Comments patient had positive at home pregnancy test   Allergies: No Known Drug Allergies  Orders Added: 1)  T-Pregnancy (Serum), Quant. [[id number]] 2)  Urine Pregnancy Test  [[address]]

## 2010-12-15 NOTE — Letter (Signed)
Summary: MEDICAL EVALUATION  MEDICAL EVALUATION   Imported By: Lind Guest 07/16/2010 13:58:08  _____________________________________________________________________  External Attachment:    Type:   Image     Comment:   External Document

## 2010-12-15 NOTE — Assessment & Plan Note (Signed)
Summary: ov   Vital Signs:  Patient profile:   35 year old female Menstrual status:  regular Height:      66.5 inches Weight:      290 pounds BMI:     46.27 O2 Sat:      97 % Pulse rate:   120 / minute Pulse rhythm:   regular Resp:     16 per minute BP sitting:   108 / 80 Cuff size:   xl  Vitals Entered By: Everitt Amber (November 17, 2009 10:37 AM)  Nutrition Counseling: Patient's BMI is greater than 25 and therefore counseled on weight management options. CC: has been really tired and weak feeling, for almost a week. Burning on urination, frequency. Hasn't been eating or drinking as much because she doesn't feel good and has no appetitie   Primary Care Provider:  Syliva Overman MD  CC:  has been really tired and weak feeling, for almost a week. Burning on urination, and frequency. Hasn't been eating or drinking as much because she doesn't feel good and has no appetitie.  History of Present Illness: Jennifer [name] comes in with a primary c/o burning with urination,painful ulcers in vaginal intritus and vaginal discharge. She also c/o sore throat. Her symptoms have developed over the past approx 5 days and have progressively worsened. She is experiencing malaise, but has had no fever or chills. She feels overwhelmed as far as her health is concerned. She feels depressed. She feels she would benefit from counselling and would like a referal for this..  Current Medications (verified): 1)  Folic Acid 1 Mg Tabs (Folic Acid) .... Take 1 Tablet By Mouth Once A Day  Allergies (verified): No Known Drug Allergies  Review of Systems      See HPI General:  Complains of fatigue, malaise, and weakness. Eyes:  Denies blurring, discharge, and red eye. ENT:  Complains of sore throat; denies earache, hoarseness, nasal congestion, postnasal drainage, and sinus pressure; 2day history. CV:  Denies chest pain or discomfort, fatigue, palpitations, and swelling of feet. Resp:  Denies cough, shortness of  breath, sputum productive, and wheezing. GI:  Denies abdominal pain, constipation, diarrhea, nausea, and vomiting. GU:  Complains of discharge, dysuria, genital sores, and urinary frequency; trying not to drink much because of painful urination started  5 days ago brown vag d/c noted. MS:  Denies joint pain, low back pain, mid back pain, and stiffness. Derm:  Complains of itching, lesion(s), and rash; puritic rash on right neck. Neuro:  Denies headaches, seizures, sensation of room spinning, and tingling. Psych:  Complains of anxiety, depression, easily tearful, and mental problems; denies easily angered, irritability, suicidal thoughts/plans, thoughts of violence, and unusual visions or sounds. Endo:  Denies cold intolerance, excessive hunger, excessive thirst, excessive urination, heat intolerance, polyuria, and weight change. Heme:  Denies abnormal bruising and bleeding. Allergy:  Complains of seasonal allergies; denies hives or rash.  Physical Exam  General:  Well-developedmorbidly obese,in no acute distress; alert,appropriate and cooperative throughout examination. Tearful a anxious. HEENT: No facial asymmetry,  EOMI, No sinus tenderness, TM's Clear, oropharynx  pink and moist.   Chest: Clear to auscultation bilaterally.  CVS: S1, S2, No murmurs, No S3.   Abd: Soft, Nontender.  MS: Adequate ROM spine, hips, shoulders and knees.  Ext: No edema.   CNS: CN 2-12 intact, power tone and sensation normal throughout.   Skin: ulcers at introitus. fungal rash on left side of the neck Psych: Good eye contact, flatl affect.  Memory intact, both anxious and depressed appearing. Pelvic: Uterus normal size, cervix firm,both cervical motion and  adnexal tenderness   Impression & Recommendations:  Problem # 1:  PELVIC INFLAMMATORY DISEASE, ACUTE (ICD-614.9) Assessment Comment Only  Orders: Rocephin  250mg  (E4540) Admin of Therapeutic Inj  intramuscular or subcutaneous (98119)  Problem # 2:   UNSPECIFIED VAGINITIS AND VULVOVAGINITIS (ICD-616.10) Assessment: Comment Only  The following medications were removed from the medication list:    Septra Ds 800-160 Mg Tabs (Sulfamethoxazole-trimethoprim) .Marland Kitchen... Take 1 tablet by mouth two times a day Her updated medication list for this problem includes:    Ciprofloxacin Hcl 500 Mg Tabs (Ciprofloxacin hcl) .Marland Kitchen... Take 1 tablet by mouth two times a day WET PREP AND CULTURE SENT  Problem # 3:  SEXUALLY TRANSMITTED DISEASE, EXPOSURE TO (ICD-V01.6) Assessment: Comment Only  Orders: T-Herpes Simplex Type 2 ([id number]) T-Syphilis Test (RPR) [phone]) T-Wet Prep by Molecular Probe [phone]) T-Chlamydia & GC Probe, Genital ([id number])  Problem # 4:  OBESITY (ICD-278.00) Assessment: Unchanged  Ht: 66.5 (11/17/2009)   Wt: 290 (11/17/2009)   BMI: 46.27 (11/17/2009)  Problem # 5:  DEPRESSION (ICD-311) Assessment: Deteriorated  Her updated medication list for this problem includes:    Bupropion Hcl 150 Mg Xr12h-tab (Bupropion hcl) .Marland Kitchen... Take 1 tablet by mouth once a day  Orders: Psychology Referral (Psychology)  Problem # 6:  ACUTE CYSTITIS (ICD-595.0) Assessment: Comment Only  The following medications were removed from the medication list:    Septra Ds 800-160 Mg Tabs (Sulfamethoxazole-trimethoprim) .Marland Kitchen... Take 1 tablet by mouth two times a day Her updated medication list for this problem includes:    Ciprofloxacin Hcl 500 Mg Tabs (Ciprofloxacin hcl) .Marland Kitchen... Take 1 tablet by mouth two times a day  Orders: UA Dipstick W/ Micro (manual) (44010) T-Culture, Urine (27253-66440)  Encouraged to push clear liquids, get enough rest, and take acetaminophen as needed. To be seen in 10 days if no improvement, sooner if worse.  Problem # 7:  DERMATOMYCOSIS (ICD-111.9) Assessment: Comment Only  The following medications were removed from the medication list:    Fluconazole 150 Mg Tabs (Fluconazole) .Marland Kitchen... Take 1 tablet by mouth  once a day as needed Her updated medication list for this problem includes:    Clotrimazole-betamethasone 1-0.05 % Lotn (Clotrimazole-betamethasone) .Marland Kitchen... Apply twice daily to affected area  Complete Medication List: 1)  Folic Acid 1 Mg Tabs (Folic acid) .... Take 1 tablet by mouth once a day 2)  Acyclovir 400 Mg Tabs (Acyclovir) .... Take 1 tablet by mouth three times a day 3)  Ciprofloxacin Hcl 500 Mg Tabs (Ciprofloxacin hcl) .... Take 1 tablet by mouth two times a day 4)  Clotrimazole-betamethasone 1-0.05 % Lotn (Clotrimazole-betamethasone) .... Apply twice daily to affected area 5)  Bupropion Hcl 150 Mg Xr12h-tab (Bupropion hcl) .... Take 1 tablet by mouth once a day  Patient Instructions: 1)  Please schedule a follow-up appointment in 1 month. 2)  You arebeing treated for 2 different infections and will have additional testing done also. We will be in touch with you with the results. 3)  It is very impt that you drinkalot of water, you are dehydrated. 4)  You will bereferred for therapy, pls go. 5)  med is sent in fo the rash also Prescriptions: CLOTRIMAZOLE-BETAMETHASONE 1-0.05 % LOTN (CLOTRIMAZOLE-BETAMETHASONE) apply twice daily to affected area  #45 gm x 0   Entered and Authorized by:   Syliva Overman MD   Signed by:   Syliva Overman MD on 11/17/2009  Method used:   Electronically to        [facility]  [address] [address] (retail)       [address]       [place], [place]  [address]       Ph: [phone]       Fax: [phone]   RxID:   [id number] CIPROFLOXACIN HCL 500 MG TABS (CIPROFLOXACIN HCL) Take 1 tablet by mouth two times a day  #10 x 0   Entered and Authorized by:   Syliva Overman MD   Signed by:   Syliva Overman MD on 11/17/2009   Method used:   Electronically to        [facility]  [address] [address] (retail)       [address]       [place], [place]  [address]       Ph: [phone]       Fax: [phone]   RxID:    [id number] ACYCLOVIR 400 MG TABS (ACYCLOVIR) Take 1 tablet by mouth three times a day  #30 x 0   Entered and Authorized by:   Syliva Overman MD   Signed by:   Syliva Overman MD on 11/17/2009   Method used:   Electronically to        [facility]  [address] [address] (retail)       [address]       [place], [place]  [address]       Ph: [phone]       Fax: [phone]   RxID:   [id number]   Laboratory Results   Urine Tests  Date/Time Received: November 17, 2009  Date/Time Reported: November 17, 2009   Routine Urinalysis   Color: lt. yellow Appearance: Clear Glucose: negative   (Normal Range: Negative) Bilirubin: small   (Normal Range: Negative) Ketone: >= (160)   (Normal Range: Negative) Spec. Gravity: >=1.030   (Normal Range: 1.003-1.035) Blood: large   (Normal Range: Negative) pH: 6.0   (Normal Range: 5.0-8.0) Protein: 100   (Normal Range: Negative) Urobilinogen: 0.2   (Normal Range: 0-1) Nitrite: negative   (Normal Range: Negative) Leukocyte Esterace: small   (Normal Range: Negative)        Medication Administration  Injection # 1:    Medication: Rocephin  250mg     Diagnosis: PELVIC INFLAMMATORY DISEASE, ACUTE (ICD-614.9)    Route: IM    Site: RUOQ gluteus    Exp Date: 02/2012    Lot #: [id number]    Mfr: [email or web address]    Comments: 500 mg given     Patient tolerated injection without complications    Given by: Everitt Amber (November 17, 2009 11:42 AM)  Orders Added: 1)  UA Dipstick W/ Micro (manual) [81000] 2)  T-Herpes Simplex Type 2 [86696-81071] 3)  T-Syphilis Test (RPR) [07371-06269] 4)  Est. Patient Level IV [48546] 5)  T-Culture, Urine [27035-00938] 6)  T-Wet Prep by Molecular Probe [18299-37169] 7)  T-Chlamydia & GC Probe, Genital [[id number]] 8)  Rocephin  250mg  [J0696] 9)  Admin of Therapeutic Inj  intramuscular or subcutaneous [96372] 10)  Psychology Referral [Psychology]

## 2010-12-15 NOTE — Progress Notes (Signed)
Summary: CALL  Phone Note Call from Patient   Summary of Call: Jennifer [name] TO CALL HER AT [phone] Initial call taken by: Lind Guest,  June 26, 2010 10:55 AM  Follow-up for Phone Call        advised patient spoke with [name] at health dept about positive ppd skin test and she states no need to panic since patient is not symptomatic, can wait until after she delivers to have chest xray Follow-up by: Adella Hare LPN,  June 26, 2010 10:59 AM

## 2010-12-15 NOTE — Progress Notes (Signed)
Summary: personnal matter  Phone Note Call from Patient   Summary of Call: needs to speak with nurse about personnal matter. [phone] cell [phone] Initial call taken by: Rudene Anda,  June 09, 2010 11:36 AM  Follow-up for Phone Call        patient spoke with dr Amare Kontos this morning is aware we cannot see her while she is pregnant Follow-up by: Adella Hare LPN,  June 10, 2010 9:49 AM

## 2010-12-15 NOTE — Assessment & Plan Note (Signed)
Summary: cough, bronchitis- room 1   Vital Signs:  Patient profile:   35 year old female Menstrual status:  regular Height:      66.5 inches Weight:      326.50 pounds BMI:     52.10 O2 Sat:      99 % on Room air Pulse rate:   114 / minute Resp:     16 per minute BP sitting:   108 / 66  (left arm)  Vitals Entered By: Adella Hare LPN (Mar 23, 1609 11:39 AM) CC: coughing, bronchitis Is Patient Diabetic? No Pain Assessment Patient in pain? no      Comments fyi- patient is 5 months pregnant   Primary Provider:  Syliva Overman MD  CC:  coughing and bronchitis.  History of Present Illness: Pt is here today with c/o cough x 3-4 weeks.  Is worsening.  Productive, yellow/green phlegm.  Cough is worse at night.  Is using over the counter cough medicine but doesn't help at night. Has a little nasal congestion.  No post nasal drainage. No fever or chills.  Had a sore throat but not recently, has resolved.  No hx of asthma, or other resp problems.  Pt is 5 mos preg.  Is under the care of her OB. Pt is a nonsmoker.   Current Medications (verified): 1)  Folic Acid 1 Mg Tabs (Folic Acid) .... Take 1 Tablet By Mouth Once A Day 2)  Prenatal Vitamins  Allergies (verified): No Known Drug Allergies  Past History:  Past medical history reviewed for relevance to current acute and chronic problems.  Past Medical History: Reviewed history from 11/24/2007 and no changes required.   FAMILY HISTORY DIABETES 1ST DEGREE RELATIVE (ICD-V18.0) UTI'S, RECURRENT (ICD-599.0) OBESITY (ICD-278.00) DEPRESSION (ICD-311)  Review of Systems General:  Denies chills and fever. ENT:  Complains of nasal congestion; denies earache, postnasal drainage, sinus pressure, and sore throat. CV:  Denies chest pain or discomfort. Resp:  Complains of cough, shortness of breath, and sputum productive; denies wheezing. Allergy:  Denies seasonal allergies and sneezing.  Physical Exam  General:   Well-developed,well-nourished,in no acute distress; alert,appropriate and cooperative throughout examination Head:  Normocephalic and atraumatic without obvious abnormalities. No apparent alopecia or balding. Ears:  External ear exam shows no significant lesions or deformities.  Otoscopic examination reveals clear canals, tympanic membranes are intact bilaterally without bulging, retraction, inflammation or discharge. Hearing is grossly normal bilaterally. Nose:  External nasal examination shows no deformity or inflammation. Nasal mucosa are pink and moist without lesions or exudates. Mouth:  Oral mucosa and oropharynx without lesions or exudates.  Teeth in good repair. Neck:  No deformities, masses, or tenderness noted. Lungs:  Bronchovesicular BS bilat.  No rales or wheeze.  normal respiratory effort.   Heart:  Normal rate and regular rhythm. S1 and S2 normal without gallop, murmur, click, rub or other extra sounds. Cervical Nodes:  No lymphadenopathy noted Psych:  Cognition and judgment appear intact. Alert and cooperative with normal attention span and concentration. No apparent delusions, illusions, hallucinations   Impression & Recommendations:  Problem # 1:  ACUTE BRONCHITIS (ICD-466.0) Assessment New  The following medications were removed from the medication list:    Ciprofloxacin Hcl 500 Mg Tabs (Ciprofloxacin hcl) .Marland Kitchen... Take 1 tablet by mouth two times a day Her updated medication list for this problem includes:    Zithromax Z-pak 250 Mg Tabs (Azithromycin) .Marland Kitchen... Take once daily as directed    Guiatuss Ac 100-10 Mg/11ml Syrp (Guaifenesin-codeine) .Marland KitchenMarland KitchenMarland KitchenMarland Kitchen  Take 1-2 tsp at bedtime as needed for cough  Complete Medication List: 1)  Folic Acid 1 Mg Tabs (Folic acid) .... Take 1 tablet by mouth once a day 2)  Prenatal Vitamins  3)  Zithromax Z-pak 250 Mg Tabs (Azithromycin) .... Take once daily as directed 4)  Guiatuss Ac 100-10 Mg/21ml Syrp (Guaifenesin-codeine) .... Take 1-2 tsp at  bedtime as needed for cough  Patient Instructions: 1)  Get plenty of rest, drink lots of clear liquids, and use Tylenol for fever and comfort. Return in 7-10 days if you're not better:sooner if you're feeling worse. 2)  You may continue to use an over the counter cough medicine as needed during the daytime.  I have prescribed a prescription cough medicine for bedtime. 3)  Run a humidifier or vaporizer in your room at bedtime. 4)  I have prescribed an antibiotic for you also. Prescriptions: GUIATUSS AC 100-10 MG/5ML SYRP (GUAIFENESIN-CODEINE) take 1-2 tsp at bedtime as needed for cough  #4 oz x 0   Entered and Authorized by:   Esperanza Sheets PA   Signed by:   Esperanza Sheets PA on 03/23/2010   Method used:   Printed then faxed to ...       [facility]  [address] (retail)       [address]       [place], [place]  [address]       Ph: [phone]       Fax: [phone]   RxID:   [id number] ZITHROMAX Z-PAK 250 MG TABS (AZITHROMYCIN) take once daily as directed  #1 x 0   Entered and Authorized by:   Esperanza Sheets PA   Signed by:   Esperanza Sheets PA on 03/23/2010   Method used:   Electronically to        [facility]  [address] (retail)       [address]       [place], [place]  [address]       Ph: [phone]       Fax: [phone]   RxID:   [id number]

## 2010-12-15 NOTE — Progress Notes (Signed)
Summary: speak nurse  Phone Note Call from Patient   Summary of Call: pt needs to speak with nurse about medical eval sheet.  [phone]  Initial call taken by: Rudene Anda,  July 14, 2010 10:32 AM  Follow-up for Phone Call        was not completed and needs to bring it back by for Dr. Lodema Hong to finish. Told her ok to bring it back by today  Follow-up by: Everitt Amber LPN,  July 14, 2010 10:39 AM

## 2010-12-17 NOTE — Assessment & Plan Note (Signed)
Summary: follow up/slj   Vital Signs:  Patient profile:   35 year old female Menstrual status:  regular Height:      66.5 inches Weight:      332 pounds BMI:     52.97 O2 Sat:      98 % on Room air Pulse rate:   82 / minute Pulse rhythm:   regular Resp:     16 per minute BP sitting:   116 / 80  (left arm)  Vitals Entered By: Adella Hare LPN (November 02, 2010 8:54 AM)  Nutrition Counseling: Patient's BMI is greater than 25 and therefore counseled on weight management options.  O2 Flow:  Room air CC: follow-up visit Is Patient Diabetic? No Pain Assessment Patient in pain? no      Comments patient is also taking TB med but not sure the name of it   Primary Care Provider:  Syliva Overman MD  CC:  follow-up visit.  History of Present Illness: Reports  that she has been doing fairly well. since her last visit she has birthed her first chikld who is now 80 months old , and a Film/video editor. Unfortunately she will be raised without her father who is not supportive at all. Laityn is unemployed, seeking work , and becoming stressed and deprerssed about her situation. She certainly has absolutely no remorse where having her child is concerned, this "gives her a reason to live" Denies recent fever or chills. Denies sinus pressure, nasal congestion , ear pain or sore throat. Denies chest congestion, or cough productive of sputum. Denies chest pain, palpitations, PND, orthopnea or leg swelling. Denies abdominal pain, nausea, vomitting, diarrhea or constipation. Denies change in bowel movements or bloody stool. Denies dysuria , frequency, incontinence or hesitancy. Denies  joint pain, swelling, or reduced mobility. Denies headaches, vertigo, seizures.  Denies  rash, lesions, or itch.She has severe allopecia for which she was being trated in the past, and she wants to resume treatment. Pt is currently being treated through the health dept for a positive PPD test, I have advised that she  take no more skin tests     Current Medications (verified): 1)  Folic Acid 1 Mg Tabs (Folic Acid) .... Take 1 Tablet By Mouth Once A Day 2)  Prenatal Vitamins  Allergies (verified): No Known Drug Allergies  Past History:  Social history (including risk factors) reviewed for relevance to current acute and chronic problems. Past medical, surgical, family and social histories (including risk factors) reviewed, and no changes noted (except as noted below).  Past Medical History:   FAMILY HISTORY DIABETES 1ST DEGREE RELATIVE (ICD-V18.0) UTI'S, RECURRENT (ICD-599.0) OBESITY (ICD-278.00) DEPRESSION (ICD-311) allopecia   Past Surgical History: Reviewed history from 11/24/2007 and no changes required. Appendectomy  Family History: Reviewed history from 11/24/2007 and no changes required. Family History Diabetes 1st degree relative: Mother Family History Hypertension: Mother Mother: deceased 64yo,cancer was the cause of death  Father: 77's hx unknown Sister: x1, healthy Brother: x1, healthy  Social History: Reviewed history from 11/24/2007 and no changes required. Occupation: [employer], currently unemployed, looking for a job12/2011 Single Alcohol use-no Drug use-no Three daughters, 1 biologic born Sept 5,2011  Review of Systems      See HPI General:  Complains of fatigue. Eyes:  Denies blurring and discharge. Derm:  Complains of hair loss. Psych:  Complains of anxiety, depression, easily tearful, and mental problems; denies suicidal thoughts/plans, thoughts of violence, and unusual visions or sounds. Endo:  Denies cold intolerance, excessive  hunger, excessive thirst, and excessive urination; positive fam h/o dibetes. Heme:  Denies abnormal bruising and enlarge lymph nodes. Allergy:  Denies hives or rash and itching eyes.  Physical Exam  General:  Well-developed,morbidl;y obese,in no acute distress; alert,appropriate and cooperative throughout examination HEENT: No  facial asymmetry,  EOMI, No sinus tenderness, TM's Clear, oropharynx  pink and moist.   Chest: Clear to auscultation bilaterally.  CVS: S1, S2, No murmurs, No S3.   Abd: Soft, Nontender.  MS: Adequate ROM spine, hips, shoulders and knees.  Ext: No edema.   CNS: CN 2-12 intact, power tone and sensation normal throughout.   Skin: Intact, no visible lesions or rashes.  Psych: Good eye contact, normal affect.  Memory intact, not anxious or depressed appearing.    Impression & Recommendations:  Problem # 1:  ALOPECIA (ICD-704.00) Assessment Comment Only  Orders: Dermatology Referral (Derma)  Problem # 2:  OBESITY (ICD-278.00) Assessment: Deteriorated  Ht: 66.5 (11/02/2010)   Wt: 332 (11/02/2010)   BMI: 52.97 (11/02/2010) therapeutic lifestyle change discussed and encouraged  Problem # 3:  DEPRESSION (ICD-311) Assessment: Deteriorated  Her updated medication list for this problem includes:    Wellbutrin Sr 150 Mg Xr12h-tab (Bupropion hcl) .Marland Kitchen... Take 1 tablet by mouth once a day  Discussed treatment options, including trial of antidpressant medication. Will refer to behavioral health. Follow-up call in in 24-48 hours and recheck in 2 weeks, sooner as needed. Patient agrees to call if any worsening of symptoms or thoughts of doing harm arise. Verified that the patient has no suicidal ideation at this time.   Complete Medication List: 1)  Folic Acid 1 Mg Tabs (Folic acid) .... Take 1 tablet by mouth once a day 2)  Prenatal Vitamins  3)  Wellbutrin Sr 150 Mg Xr12h-tab (Bupropion hcl) .... Take 1 tablet by mouth once a day  Other Orders: T- Hemoglobin A1C ([id number])  Patient Instructions: 1)  Please schedule a follow-up appointment in 2.5 months. 2)  It is important that you exercise regularly at least 20 minutes 5 times a week. If you develop chest pain, have severe difficulty breathing, or feel very tired , stop exercising immediately and seek medical attention. 3)  You need  to lose weight. Consider a lower calorie diet and regular exercise.  4)  HbgA1C prior to visit, ICD-9:  today 5)  You are referred to dermatology Prescriptions: WELLBUTRIN SR 150 MG XR12H-TAB (BUPROPION HCL) Take 1 tablet by mouth once a day  #30 x 2   Entered and Authorized by:   Syliva Overman MD   Signed by:   Syliva Overman MD on 11/02/2010   Method used:   Electronically to        [facility]  [address] (retail)       [address]       [place], [place]  [address]       Ph: [phone]       Fax: [phone]   RxID:   [id number]    Orders Added: 1)  Est. Patient Level IV [63875] 2)  Dermatology Referral [Derma] 3)  T- Hemoglobin A1C [[id number]]

## 2010-12-17 NOTE — Letter (Signed)
Summary: dermatology  dermatology   Imported By: Lind Guest 12/09/2010 17:19:10  _____________________________________________________________________  External Attachment:    Type:   Image     Comment:   External Document

## 2010-12-17 NOTE — Progress Notes (Signed)
  Phone Note Call from Patient   Caller: Patient Summary of Call: patient complains of vaginal itch  advised monistat otc if this does not help, needs ov   Initial call taken by: Adella Hare LPN,  October 28, 2010 1:46 PM

## 2011-01-28 LAB — RPR: RPR Ser Ql: NONREACTIVE

## 2011-01-28 LAB — CBC
HCT: 30.4 % — ABNORMAL LOW (ref 36.0–46.0)
HCT: 39.9 % (ref 36.0–46.0)
Hemoglobin: 10.4 g/dL — ABNORMAL LOW (ref 12.0–15.0)
Hemoglobin: 13.3 g/dL (ref 12.0–15.0)
MCH: 32.2 pg (ref 26.0–34.0)
MCH: 33.4 pg (ref 26.0–34.0)
MCHC: 33.4 g/dL (ref 30.0–36.0)
MCHC: 34.2 g/dL (ref 30.0–36.0)
MCV: 96.5 fL (ref 78.0–100.0)
MCV: 97.6 fL (ref 78.0–100.0)
Platelets: 262 10*3/uL (ref 150–400)
Platelets: 327 10*3/uL (ref 150–400)
RBC: 3.11 MIL/uL — ABNORMAL LOW (ref 3.87–5.11)
RBC: 4.14 MIL/uL (ref 3.87–5.11)
RDW: 15.8 % — ABNORMAL HIGH (ref 11.5–15.5)
RDW: 16 % — ABNORMAL HIGH (ref 11.5–15.5)
WBC: 10.9 10*3/uL — ABNORMAL HIGH (ref 4.0–10.5)
WBC: 9.3 10*3/uL (ref 4.0–10.5)

## 2011-02-01 ENCOUNTER — Encounter: Payer: Self-pay | Admitting: Family Medicine

## 2011-02-02 ENCOUNTER — Encounter: Payer: Self-pay | Admitting: Family Medicine

## 2011-02-02 ENCOUNTER — Ambulatory Visit (INDEPENDENT_AMBULATORY_CARE_PROVIDER_SITE_OTHER): Payer: Medicaid Other | Admitting: Family Medicine

## 2011-02-02 VITALS — BP 100/78 | HR 87 | Resp 16 | Ht 67.0 in | Wt 328.1 lb

## 2011-02-02 DIAGNOSIS — R5383 Other fatigue: Secondary | ICD-10-CM

## 2011-02-02 DIAGNOSIS — M549 Dorsalgia, unspecified: Secondary | ICD-10-CM

## 2011-02-02 DIAGNOSIS — J309 Allergic rhinitis, unspecified: Secondary | ICD-10-CM

## 2011-02-02 DIAGNOSIS — Z13220 Encounter for screening for lipoid disorders: Secondary | ICD-10-CM

## 2011-02-02 DIAGNOSIS — R7301 Impaired fasting glucose: Secondary | ICD-10-CM

## 2011-02-02 DIAGNOSIS — R7611 Nonspecific reaction to tuberculin skin test without active tuberculosis: Secondary | ICD-10-CM

## 2011-02-02 DIAGNOSIS — R5381 Other malaise: Secondary | ICD-10-CM

## 2011-02-02 MED ORDER — PHENTERMINE HCL 37.5 MG PO TABS: 37.5000 mg | ORAL_TABLET | Freq: Every day | ORAL | Status: DC

## 2011-02-02 MED ORDER — KETOROLAC TROMETHAMINE 60 MG/2ML IM SOLN
60.0000 mg | Freq: Once | INTRAMUSCULAR | Status: AC
  Administered 2011-02-02: 60 mg via INTRAMUSCULAR

## 2011-02-02 MED ORDER — METHYLPREDNISOLONE ACETATE 80 MG/ML IJ SUSP
80.0000 mg | Freq: Once | INTRAMUSCULAR | Status: AC
  Administered 2011-02-02: 80 mg via INTRAMUSCULAR

## 2011-02-02 NOTE — Patient Instructions (Signed)
F/U in 2 months.  Weight loss goal is at least 6 pounds.  Take half phentermine once daily , and drink at least 64 ounces of water every day. Walk at least 30 minutes  Every day.  Injections today for back pain. You will get a 1500 calorie diet sheet

## 2011-02-02 NOTE — Progress Notes (Signed)
Subjective:    Patient ID: ETTY MODEL, female    DOB: [birth year], 35 y.o.   MRN: [id number] Pt is in with 3 main concerns. He reports increased and uncontrolled back pain x 6 months. She denies lower ext weakness or numbness, she denies incontinence of stool or urine. The pain is aggravated by standing or walking for prolonged periods. She is concerned about her inability to lose weight and wants to change her behavior to facilitate this, she also wants the help of appetite suppressants, though she states that cost i a problem Joanne continues on therapy for a positive pPD with no problems. She has increased symptoms of depression, denies suicidal or homicidal ideation, but reports feeling overwhelmed and crying spells, and wants med for this.she has been on antidepressants in the past Back Pain Pertinent negatives include no fever.      Review of Systems  Constitutional: Negative for fever and chills.  HENT: Negative for congestion, rhinorrhea and sneezing. Postnasal drip:  1 month, clear drainage, occasionally yellow.   Respiratory: Positive for cough ( 2 months, no sputum). Negative for shortness of breath and wheezing.        [2 month h/o dry cough Musculoskeletal: Back pain:  6 month low back pain, non radiating.  Psychiatric/Behavioral: Negative for behavioral problems and agitation.  Denies recent fever or chills. Denies sinus pressure, nasal congestion, ear pain or sore throat. Denies chest congestion, productive cough or wheezing. Denies chest pains, palpitations, paroxysmal nocturnal dyspnea, orthopnea and leg swelling Denies abdominal pain, nausea, vomiting,diarrhea or constipation.  Denies rectal bleeding or change in bowel movement. Denies dysuria, frequency, hesitancy or incontinence.  Denies headaches, seizure, numbness, or tingling. . Denies skin break down or rash.         Objective:   Physical Exam Patient alert and oriented and in no Cardiopulmonary  distress.  HEENT: No facial asymmetry, EOMI, no sinus tenderness, TM's clear, Oropharynx pink and moist.  Neck supple no adenopathy.Nasal mucosa erythematous and edematous.  Chest: Clear to auscultation bilaterally.  CVS: S1, S2 no murmurs, no S3.  ABD: Soft non tender. Bowel sounds normal.  Ext: No edema  MS: decreased  ROM spine,adequate in  shoulders, hips and knees.  Skin: Intact, no ulcerations or rash noted.  Psych: Good eye contact,flat affect. Memory intact depressed appearing and tearful at times  CNS: CN 2-12 intact, power, tone and sensation normal throughout.        Assessment & Plan:  1. Back pain: deteriorated, toradol and depomedrol administered 2. Obesity:unchanged. Diet provided and discussed also apetite supresants.regular physical activity encouraged 3. Depression; deteriorated, pt to start antidepressant. She is not suicidal, homicidal or hallucinating 4. Positive PPD: continue medication for a minimum of 6 months.

## 2011-03-30 ENCOUNTER — Other Ambulatory Visit: Payer: Self-pay | Admitting: Family Medicine

## 2011-04-01 LAB — HEMOGLOBIN A1C
Hgb A1c MFr Bld: 6 % — ABNORMAL HIGH (ref <5.7)
Mean Plasma Glucose: 126 mg/dL — ABNORMAL HIGH (ref <117)

## 2011-04-01 LAB — LIPID PANEL
Cholesterol: 137 mg/dL (ref 0–200)
HDL: 47 mg/dL (ref >39)
Total CHOL/HDL Ratio: 2.9 Ratio
Triglycerides: 68 mg/dL (ref <150)

## 2011-04-01 LAB — CBC WITH DIFFERENTIAL/PLATELET
Basophils Absolute: 0 10*3/uL (ref 0.0–0.1)
Eosinophils Relative: 4 % (ref 0–5)
HCT: 40.1 % (ref 39.0–52.0)
Hemoglobin: 13.6 g/dL (ref 13.0–17.0)
Lymphocytes Relative: 41 % (ref 12–46)
MCV: 89.1 fL (ref 78.0–100.0)
Monocytes Absolute: 0.4 10*3/uL (ref 0.1–1.0)
Monocytes Relative: 6 % (ref 3–12)
Neutro Abs: 3 10*3/uL (ref 1.7–7.7)
RDW: 14.3 % (ref 11.5–15.5)
WBC: 6.1 10*3/uL (ref 4.0–10.5)

## 2011-04-14 ENCOUNTER — Encounter: Payer: Self-pay | Admitting: Family Medicine

## 2011-04-15 ENCOUNTER — Encounter: Payer: Self-pay | Admitting: Family Medicine

## 2011-04-15 ENCOUNTER — Ambulatory Visit (INDEPENDENT_AMBULATORY_CARE_PROVIDER_SITE_OTHER): Payer: Medicaid Other | Admitting: Family Medicine

## 2011-04-15 VITALS — BP 122/80 | HR 100 | Resp 16 | Ht 67.0 in | Wt 316.4 lb

## 2011-04-15 DIAGNOSIS — R5383 Other fatigue: Secondary | ICD-10-CM

## 2011-04-15 DIAGNOSIS — R7302 Impaired glucose tolerance (oral): Secondary | ICD-10-CM

## 2011-04-15 DIAGNOSIS — F329 Major depressive disorder, single episode, unspecified: Secondary | ICD-10-CM

## 2011-04-15 DIAGNOSIS — R7309 Other abnormal glucose: Secondary | ICD-10-CM

## 2011-04-15 DIAGNOSIS — E669 Obesity, unspecified: Secondary | ICD-10-CM

## 2011-04-15 DIAGNOSIS — R5381 Other malaise: Secondary | ICD-10-CM

## 2011-04-15 DIAGNOSIS — L0291 Cutaneous abscess, unspecified: Secondary | ICD-10-CM

## 2011-04-15 MED ORDER — DOXYCYCLINE HYCLATE 100 MG PO TABS: 100.0000 mg | ORAL_TABLET | Freq: Two times a day (BID) | ORAL | Status: AC

## 2011-04-15 MED ORDER — PHENTERMINE HCL 37.5 MG PO TABS: 37.5000 mg | ORAL_TABLET | Freq: Every day | ORAL | Status: DC

## 2011-04-15 NOTE — Progress Notes (Signed)
Subjective:    Patient ID: Jennifer [name], female    DOB: June 09, 1976, 35 y.o.   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 Has been doing well with weight loss, and phentermine though recently gained 10 pounds from overeating.  Goal is 6 to 8 pounds per month   Review of Systems    Denies recent fever or chills. Denies sinus pressure, nasal congestion, ear pain or sore throat. Denies chest congestion, productive cough or wheezing. Denies chest pains, palpitations, paroxysmal nocturnal dyspnea, orthopnea and leg swelling Denies abdominal pain, nausea, vomiting,diarrhea or constipation.  Denies rectal bleeding or change in bowel movement. Denies dysuria, frequency, hesitancy or incontinence. Denies joint pain, swelling and limitation in mobility. Denies headaches, seizure, numbness, or tingling. Denies depression, anxiety or insomnia.     Objective:   Physical Exam Patient alert and oriented and in no Cardiopulmonary distress.  HEENT: No facial asymmetry, EOMI, no sinus tenderness, TM's clear, Oropharynx pink and moist.  Neck supple no adenopathy.  Chest: Clear to auscultation bilaterally.  CVS: S1, S2 no murmurs, no S3.  ABD: Soft non tender. Bowel sounds normal.  Ext: No edema  MS: Adequate ROM spine, shoulders, hips and knees.  Skin: Intact, no ulcerations or rash noted.  Psych: Good eye contact, normal affect. Memory intact not anxious or depressed appearing.  CNS: CN 2-12 intact, power, tone and sensation normal throughout.        Assessment & Plan:

## 2011-04-15 NOTE — Patient Instructions (Addendum)
cPE  in 4 months.  Goal weight loss is 6 to 8 pounds per month  It is important that you exercise regularly at least 30 minutes 5 times a week. If you develop chest pain, have severe difficulty breathing, or feel very tired, stop exercising immediately and seek medical attention    A healthy diet is rich in fruit, vegetables and whole grains. Poultry fish, nuts and beans are a healthy choice for protein rather then red meat. A low sodium diet and drinking 64 ounces of water daily is generally recommended. Oils and sweet should be limited. Carbohydrates especially for those who are diabetic or overweight, should be limited to 34-45 gram per meal. It is important to eat on a regular schedule, at least 3 times daily. Snacks should be primarily fruits, vegetables or nuts. Med is sent in for the boil, and phentermine refilled   Fasting chem 7, lipid,tsh and hBA1c and CBC in 4 months

## 2011-04-18 NOTE — Assessment & Plan Note (Signed)
Improved , pt to continue phentermine half daily, follow cal counted diet and commit to reg exercise

## 2011-04-18 NOTE — Assessment & Plan Note (Signed)
Improved, pt no longer on med

## 2011-04-18 NOTE — Assessment & Plan Note (Signed)
Recurrent continue med

## 2011-08-04 LAB — BASIC METABOLIC PANEL
BUN: 8 mg/dL (ref 6–23)
CO2: 30 mEq/L (ref 19–32)
Calcium: 9.1 mg/dL (ref 8.4–10.5)
Creat: 0.65 mg/dL (ref 0.50–1.10)
Glucose, Bld: 89 mg/dL (ref 70–99)

## 2011-08-04 LAB — CBC WITH DIFFERENTIAL/PLATELET
Basophils Absolute: 0 10*3/uL (ref 0.0–0.1)
Eosinophils Relative: 7 % — ABNORMAL HIGH (ref 0–5)
HCT: 39.9 % (ref 36.0–46.0)
Lymphocytes Relative: 39 % (ref 12–46)
MCHC: 32.6 g/dL (ref 30.0–36.0)
MCV: 93.4 fL (ref 78.0–100.0)
Monocytes Absolute: 0.3 10*3/uL (ref 0.1–1.0)
RDW: 14.6 % (ref 11.5–15.5)
WBC: 6.3 10*3/uL (ref 4.0–10.5)

## 2011-08-04 LAB — LIPID PANEL
Cholesterol: 123 mg/dL (ref 0–200)
Total CHOL/HDL Ratio: 2.4 Ratio

## 2011-08-04 LAB — HEMOGLOBIN A1C
Hgb A1c MFr Bld: 6 % — ABNORMAL HIGH (ref <5.7)
Mean Plasma Glucose: 126 mg/dL — ABNORMAL HIGH (ref <117)

## 2011-08-04 LAB — TSH: TSH: 1.82 u[IU]/mL (ref 0.350–4.500)

## 2011-08-11 ENCOUNTER — Encounter: Payer: Self-pay | Admitting: Family Medicine

## 2011-08-12 ENCOUNTER — Encounter: Payer: Self-pay | Admitting: Family Medicine

## 2011-08-12 ENCOUNTER — Encounter: Payer: Medicaid Other | Admitting: Family Medicine

## 2012-03-22 ENCOUNTER — Ambulatory Visit (HOSPITAL_COMMUNITY)
Admission: RE | Admit: 2012-03-22 | Discharge: 2012-03-22 | Disposition: A | Discharge status: Home / Self Care | Payer: Medicaid Other | Source: Ambulatory Visit | Attending: Family Medicine | Admitting: Family Medicine

## 2012-03-22 ENCOUNTER — Encounter: Payer: Self-pay | Admitting: Family Medicine

## 2012-03-22 ENCOUNTER — Ambulatory Visit (INDEPENDENT_AMBULATORY_CARE_PROVIDER_SITE_OTHER): Payer: Medicaid Other | Admitting: Family Medicine

## 2012-03-22 VITALS — BP 124/84 | HR 90 | Resp 16 | Ht 67.0 in | Wt 315.0 lb

## 2012-03-22 DIAGNOSIS — A150 Tuberculosis of lung: Secondary | ICD-10-CM

## 2012-03-22 DIAGNOSIS — R7303 Prediabetes: Secondary | ICD-10-CM

## 2012-03-22 DIAGNOSIS — K3189 Other diseases of stomach and duodenum: Secondary | ICD-10-CM

## 2012-03-22 DIAGNOSIS — R5383 Other fatigue: Secondary | ICD-10-CM

## 2012-03-22 DIAGNOSIS — R5381 Other malaise: Secondary | ICD-10-CM

## 2012-03-22 DIAGNOSIS — R7309 Other abnormal glucose: Secondary | ICD-10-CM

## 2012-03-22 DIAGNOSIS — E669 Obesity, unspecified: Secondary | ICD-10-CM

## 2012-03-22 DIAGNOSIS — R1013 Epigastric pain: Secondary | ICD-10-CM

## 2012-03-22 DIAGNOSIS — R7611 Nonspecific reaction to tuberculin skin test without active tuberculosis: Secondary | ICD-10-CM | POA: Insufficient documentation

## 2012-03-22 IMAGING — CR DG CHEST 2V
2 series · 2 of 2 positions shown · non-contrast
Comparison: Chest x-ray of [DATE]

CLINICAL DATA: Positive PPD

CHEST - 2 VIEW

[view not recorded (1 of 2)]
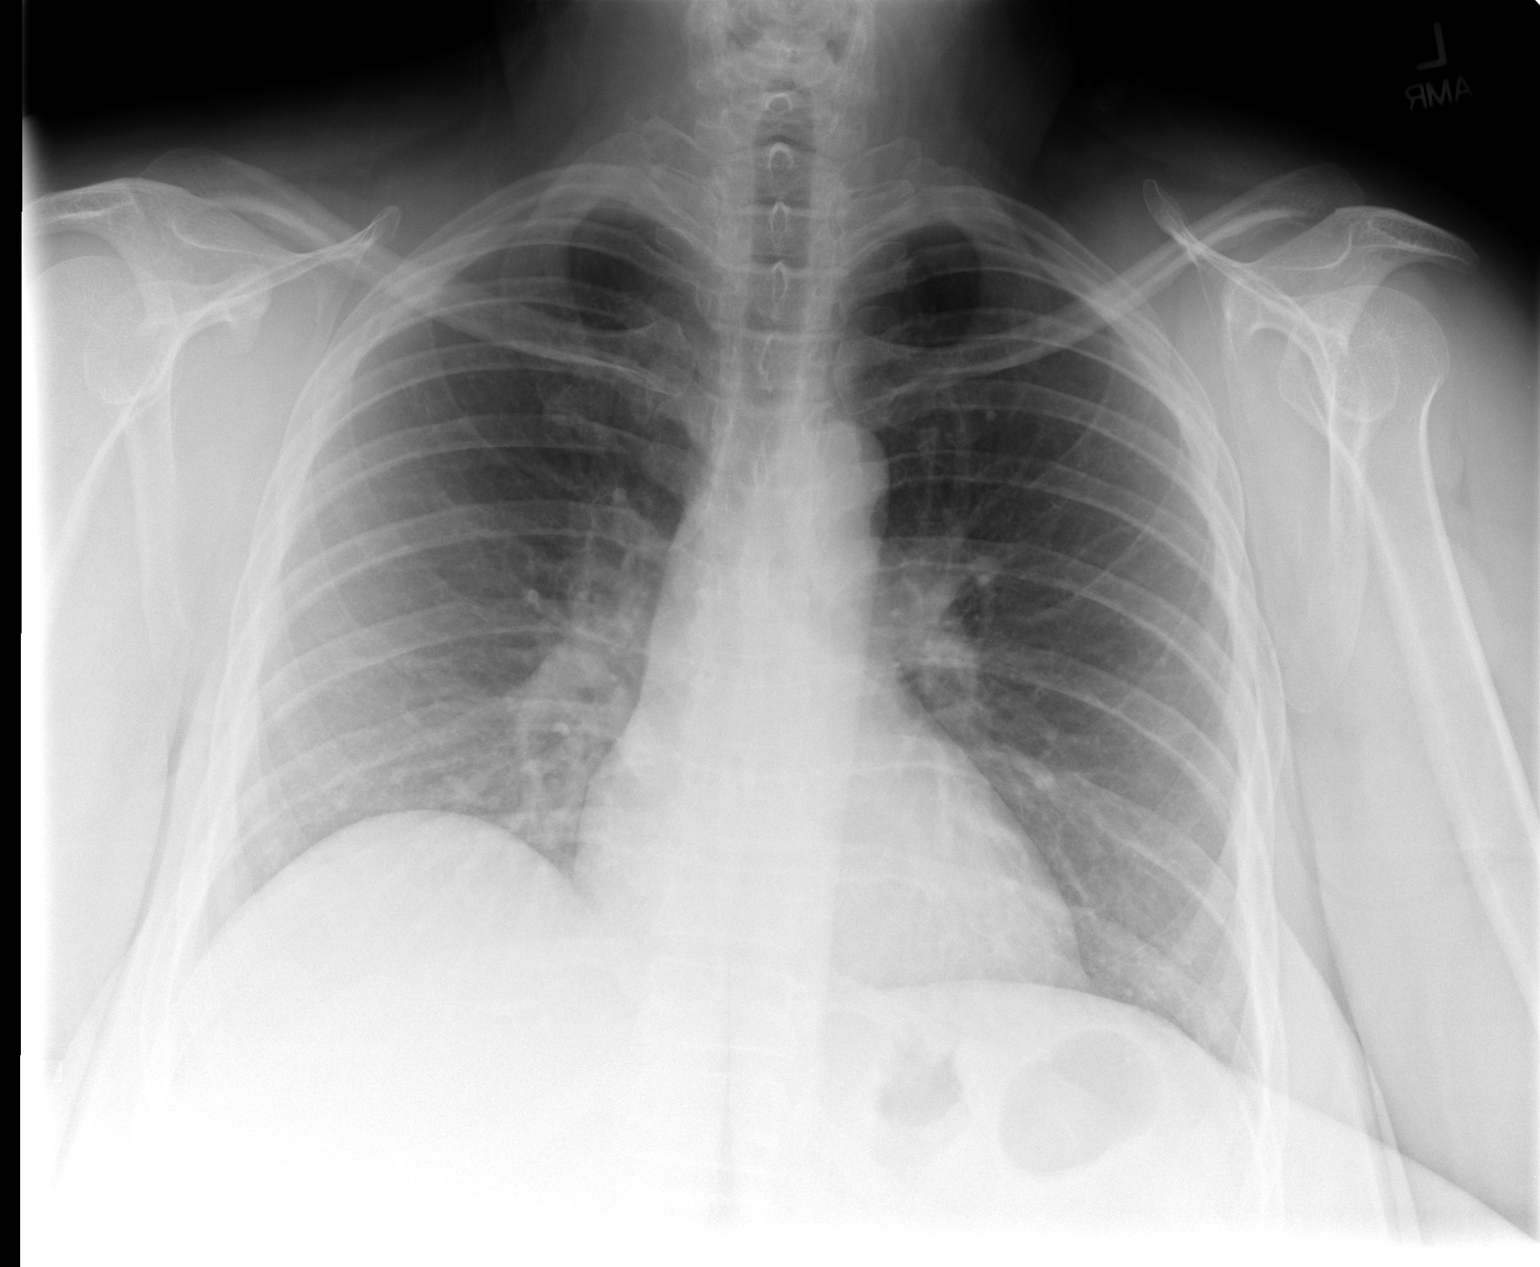

[view not recorded (2 of 2)]
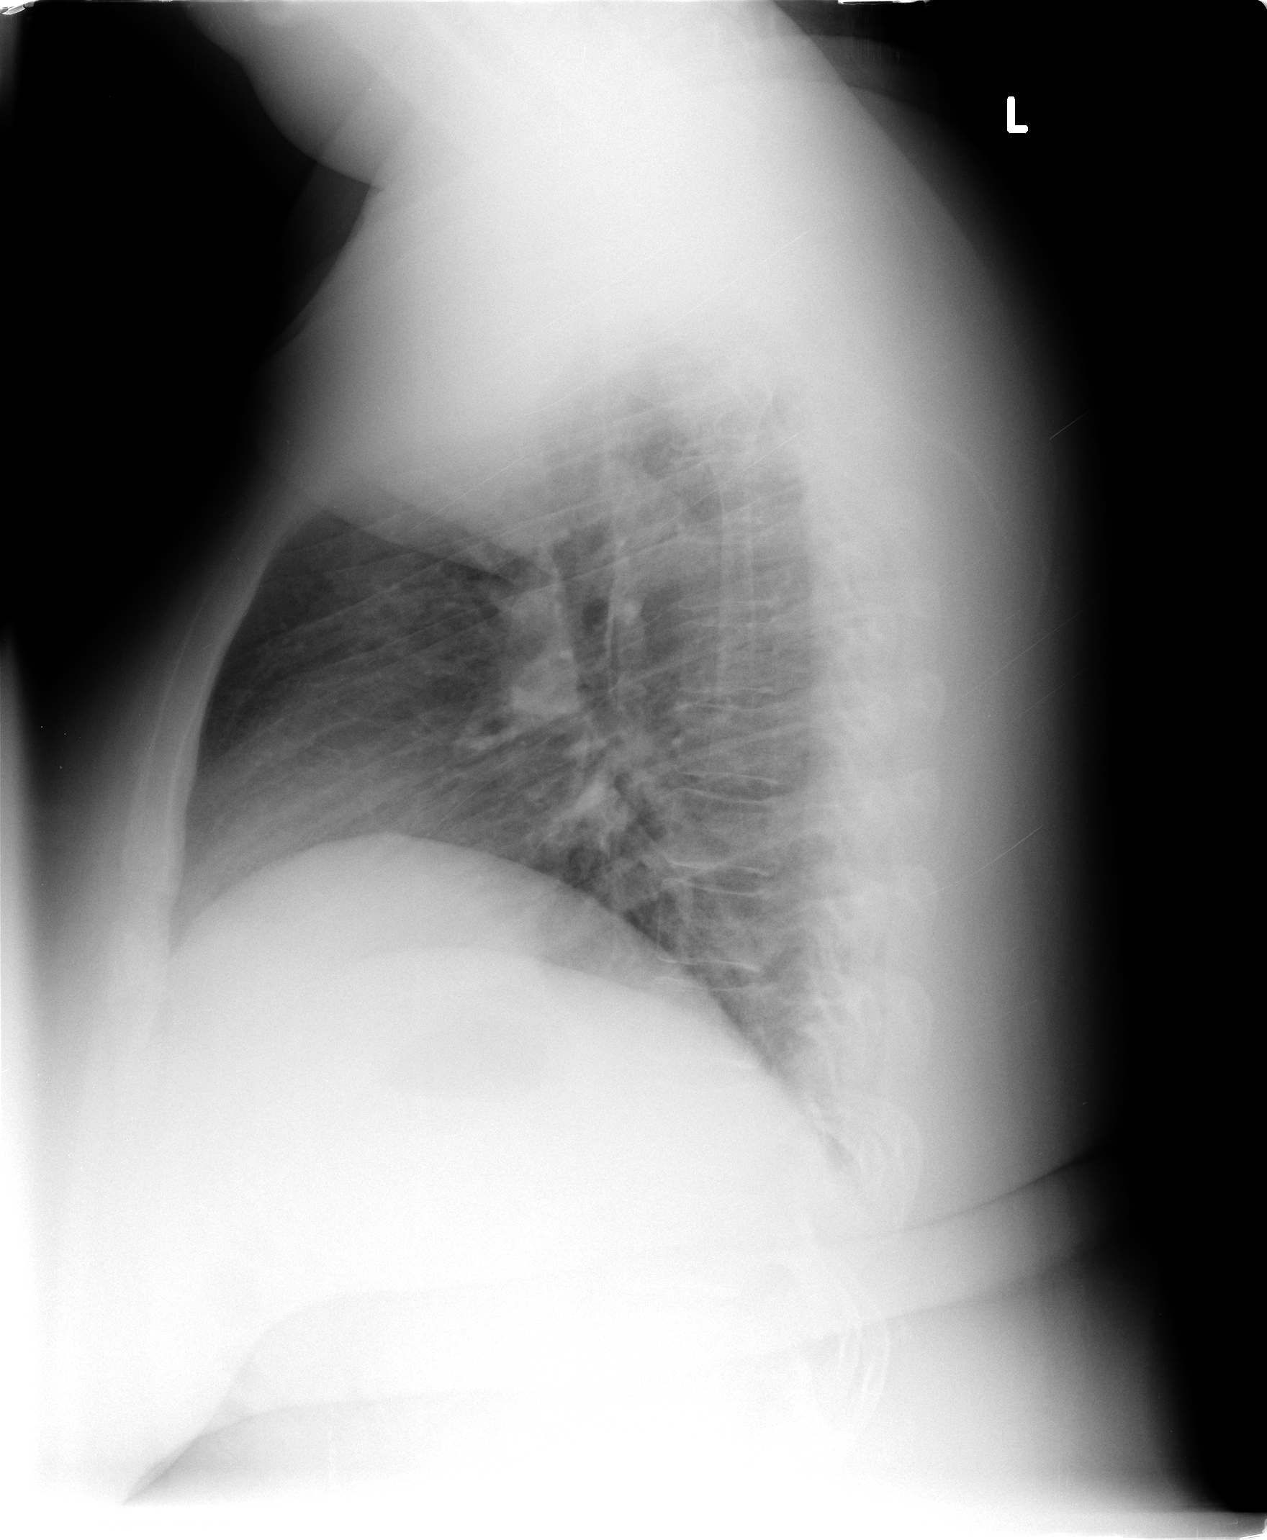

[2 of 2 positions shown; findings below may reference images not displayed]

FINDINGS: No active infiltrate or effusion is seen.  No sequelae of
prior tuberculous infection is seen.  The mediastinal contours are
stable.  The heart is within normal limits in size.  No bony
abnormality is seen.
IMPRESSION: No active lung disease.

## 2012-03-22 NOTE — Progress Notes (Signed)
Subjective:    Patient ID: Jennifer [name], female    DOB: 22-Jul-1976, 36 y.o.   MRN: [id number]  HPI Here for evaluation for health status for foster care. Needs CXR also f/u of hBa1C  Recently this week notes tingling in fingers and toes, a lot of stress, but denies overt symptoms of depression.  No commitment to weight loss through lifestyle change consistently. Pap past due plans to return to [facility]ne for this   Review of Systems See HPI Denies recent fever or chills. Denies sinus pressure, nasal congestion, ear pain or sore throat. Denies chest congestion, productive cough or wheezing. Denies chest pains, palpitations and leg swelling Denies abdominal pain, nausea, vomiting,diarrhea or constipation.   Denies dysuria, frequency, hesitancy or incontinence. Denies joint pain, swelling and limitation in mobility. Denies headaches or  seizures Denies skin break down or rash.        Objective:   Physical Exam        Assessment & Plan:

## 2012-03-22 NOTE — Patient Instructions (Addendum)
F/U in  3 month   HB1C,and Hpylori  And B12  today and in 4 months  It is important that you exercise regularly at least 30 minutes 5 times a week. If you develop chest pain, have severe difficulty breathing, or feel very tired, stop exercising immediately and seek medical attention   A healthy diet is rich in fruit, vegetables and whole grains. Poultry fish, nuts and beans are a healthy choice for protein rather then red meat. A low sodium diet and drinking 64 ounces of water daily is generally recommended. Oils and sweet should be limited. Carbohydrates especially for those who are diabetic or overweight, should be limited to 34-45 gram per meal. It is important to eat on a regular schedule, at least 3 times daily. Snacks should be primarily fruits, vegetables or nuts.  Weight loss goal of 9 pounds

## 2012-03-23 LAB — H. PYLORI ANTIBODY, IGG: H Pylori IgG: 0.61 {ISR}

## 2012-03-26 DIAGNOSIS — R7303 Prediabetes: Secondary | ICD-10-CM | POA: Insufficient documentation

## 2012-03-26 NOTE — Assessment & Plan Note (Addendum)
Pt has been treated for positive PPD, had no classic symptoms of active infection. 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

## 2012-03-26 NOTE — Assessment & Plan Note (Signed)
Deteriorated. Patient re-educated about  the importance of commitment to a  minimum of 150 minutes of exercise per week. The importance of healthy food choices with portion control discussed. Encouraged to start a food diary, count calories and to consider  joining a support group. Sample diet sheets offered. Goals set by the patient for the next several months.    

## 2012-03-26 NOTE — Assessment & Plan Note (Signed)
The importance of weight loss and lifestyle change is stressed 

## 2012-06-14 LAB — HEMOGLOBIN A1C
Hgb A1c MFr Bld: 6.2 % — ABNORMAL HIGH (ref <5.7)
Mean Plasma Glucose: 131 mg/dL — ABNORMAL HIGH (ref <117)

## 2012-06-14 LAB — VITAMIN B12: Vitamin B-12: 226 pg/mL (ref 211–911)

## 2012-06-22 ENCOUNTER — Encounter: Payer: Self-pay | Admitting: Family Medicine

## 2012-06-22 ENCOUNTER — Ambulatory Visit (INDEPENDENT_AMBULATORY_CARE_PROVIDER_SITE_OTHER): Payer: Medicaid Other | Admitting: Family Medicine

## 2012-06-22 ENCOUNTER — Other Ambulatory Visit (HOSPITAL_COMMUNITY)
Admission: RE | Admit: 2012-06-22 | Discharge: 2012-06-22 | Disposition: A | Discharge status: Home / Self Care | Payer: Medicaid Other | Source: Ambulatory Visit | Attending: Family Medicine | Admitting: Family Medicine

## 2012-06-22 ENCOUNTER — Ambulatory Visit: Payer: Medicaid Other | Admitting: Family Medicine

## 2012-06-22 VITALS — BP 132/88 | HR 112 | Resp 18 | Ht 67.0 in | Wt 315.1 lb

## 2012-06-22 DIAGNOSIS — R7303 Prediabetes: Secondary | ICD-10-CM

## 2012-06-22 DIAGNOSIS — R87810 Cervical high risk human papillomavirus (HPV) DNA test positive: Secondary | ICD-10-CM | POA: Insufficient documentation

## 2012-06-22 DIAGNOSIS — Z113 Encounter for screening for infections with a predominantly sexual mode of transmission: Secondary | ICD-10-CM

## 2012-06-22 DIAGNOSIS — Z0000 Encounter for general adult medical examination without abnormal findings: Secondary | ICD-10-CM

## 2012-06-22 DIAGNOSIS — N760 Acute vaginitis: Secondary | ICD-10-CM | POA: Insufficient documentation

## 2012-06-22 DIAGNOSIS — Z01419 Encounter for gynecological examination (general) (routine) without abnormal findings: Secondary | ICD-10-CM | POA: Insufficient documentation

## 2012-06-22 DIAGNOSIS — E669 Obesity, unspecified: Secondary | ICD-10-CM

## 2012-06-22 DIAGNOSIS — R7309 Other abnormal glucose: Secondary | ICD-10-CM

## 2012-06-22 MED ORDER — METFORMIN HCL 500 MG PO TABS: 500.0000 mg | ORAL_TABLET | Freq: Three times a day (TID) | ORAL | Status: DC

## 2012-06-22 NOTE — Patient Instructions (Addendum)
F/u in 4 month  HIV test today.  You are started on metformin to help with blood sugar control, and also help you to change your eating habits and lose weight   It is important that you exercise regularly at least 30 minutes 5 times a week. If you develop chest pain, have severe difficulty breathing, or feel very tired, stop exercising immediately and seek medical attention   A healthy diet is rich in fruit, vegetables and whole grains. Poultry fish, nuts and beans are a healthy choice for protein rather then red meat. A low sodium diet and drinking 64 ounces of water daily is generally recommended. Oils and sweet should be limited. Carbohydrates especially for those who are diabetic or overweight, should be limited to 34-45 gram per meal. It is important to eat on a regular schedule, at least 3 times daily. Snacks should be primarily fruits, vegetables or nuts.  HBa1C and chem 7 and tSH in 4 months before visit

## 2012-06-24 DIAGNOSIS — Z0000 Encounter for general adult medical examination without abnormal findings: Secondary | ICD-10-CM | POA: Insufficient documentation

## 2012-06-24 NOTE — Assessment & Plan Note (Signed)
Deteriorated. Patient re-educated about  the importance of commitment to a  minimum of 150 minutes of exercise per week. The importance of healthy food choices with portion control discussed. Encouraged to start a food diary, count calories and to consider  joining a support group. Sample diet sheets offered. Goals set by the patient for the next several months.    

## 2012-06-24 NOTE — Assessment & Plan Note (Signed)
Increase  metformin to help with this, HBA1C increased. Patient advised to reduce carb and sweets, commit to regular physical activity, take meds as prescribed, test blood as directed, and attempt to lose weight, to improve blood sugar control.

## 2012-06-24 NOTE — Assessment & Plan Note (Signed)
Annual exam done at this vist. Pt also counseled re healthy lifestyle as far as regular physical activity and healthy eating habits are concerned. Need for weight loss stressed
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
## 2012-06-26 ENCOUNTER — Other Ambulatory Visit: Payer: Self-pay

## 2012-06-26 ENCOUNTER — Other Ambulatory Visit: Payer: Self-pay | Admitting: Family Medicine

## 2012-06-26 ENCOUNTER — Telehealth: Payer: Self-pay | Admitting: Family Medicine

## 2012-06-26 MED ORDER — METRONIDAZOLE 500 MG PO TABS: 500.0000 mg | ORAL_TABLET | Freq: Two times a day (BID) | ORAL | Status: DC

## 2012-06-30 NOTE — Telephone Encounter (Signed)
Patient states she is feeling better now.

## 2012-07-12 ENCOUNTER — Other Ambulatory Visit: Payer: Self-pay | Admitting: Family Medicine

## 2012-07-12 DIAGNOSIS — B977 Papillomavirus as the cause of diseases classified elsewhere: Secondary | ICD-10-CM

## 2012-07-25 ENCOUNTER — Telehealth: Payer: Self-pay | Admitting: Family Medicine

## 2012-07-26 NOTE — Telephone Encounter (Signed)
Big red bumps, noticed that one of her kids had it also. Went to a cookout and a child had scabies. Spots on back of arm and a few on the lower part of arms and also on legs. Plans on taking child to ED to have her evaluated and if it is indeed scabies she states she will use the same cream given. Or if not, call back to make appt

## 2012-10-26 ENCOUNTER — Ambulatory Visit: Payer: Medicaid Other | Admitting: Family Medicine

## 2013-02-12 ENCOUNTER — Encounter: Payer: Self-pay | Admitting: Family Medicine

## 2013-02-12 ENCOUNTER — Ambulatory Visit (INDEPENDENT_AMBULATORY_CARE_PROVIDER_SITE_OTHER): Payer: BC Managed Care – PPO | Admitting: Family Medicine

## 2013-02-12 VITALS — BP 118/80 | HR 96 | Resp 18 | Ht 67.0 in | Wt 313.1 lb

## 2013-02-12 DIAGNOSIS — E669 Obesity, unspecified: Secondary | ICD-10-CM

## 2013-02-12 DIAGNOSIS — R7309 Other abnormal glucose: Secondary | ICD-10-CM

## 2013-02-12 DIAGNOSIS — R5381 Other malaise: Secondary | ICD-10-CM

## 2013-02-12 DIAGNOSIS — R7303 Prediabetes: Secondary | ICD-10-CM

## 2013-02-12 DIAGNOSIS — J309 Allergic rhinitis, unspecified: Secondary | ICD-10-CM

## 2013-02-12 DIAGNOSIS — Z13220 Encounter for screening for lipoid disorders: Secondary | ICD-10-CM

## 2013-02-12 DIAGNOSIS — R5383 Other fatigue: Secondary | ICD-10-CM

## 2013-02-12 LAB — CBC
HCT: 38.3 % (ref 36.0–46.0)
Hemoglobin: 13.2 g/dL (ref 12.0–15.0)
MCH: 29.7 pg (ref 26.0–34.0)
MCHC: 34.5 g/dL (ref 30.0–36.0)
MCV: 86.3 fL (ref 78.0–100.0)
Platelets: 371 K/uL (ref 150–400)
RBC: 4.44 MIL/uL (ref 3.87–5.11)
RDW: 14.2 % (ref 11.5–15.5)
WBC: 6.2 K/uL (ref 4.0–10.5)

## 2013-02-12 LAB — LIPID PANEL
HDL: 45 mg/dL (ref >39)
LDL Cholesterol: 54 mg/dL (ref 0–99)
Triglycerides: 45 mg/dL (ref <150)

## 2013-02-12 LAB — HEMOGLOBIN A1C
Hgb A1c MFr Bld: 6 % — ABNORMAL HIGH (ref <5.7)
Mean Plasma Glucose: 126 mg/dL — ABNORMAL HIGH (ref <117)

## 2013-02-12 LAB — BASIC METABOLIC PANEL WITH GFR
BUN: 9 mg/dL (ref 6–23)
CO2: 27 meq/L (ref 19–32)
Calcium: 9.2 mg/dL (ref 8.4–10.5)
Chloride: 102 meq/L (ref 96–112)
Creat: 0.62 mg/dL (ref 0.50–1.10)
Glucose, Bld: 82 mg/dL (ref 70–99)
Potassium: 4 meq/L (ref 3.5–5.3)
Sodium: 138 meq/L (ref 135–145)

## 2013-02-12 MED ORDER — FLUTICASONE PROPIONATE 50 MCG/ACT NA SUSP: 1.0000 | Freq: Every day | NASAL | Status: DC

## 2013-02-12 NOTE — Patient Instructions (Addendum)
F/u in 4 month  Labs today cbc, tsh, HBA1C, lipid, chem 7  You are referred for evaluation for weight loss surgery.   I will send for last visit from Dr Emelda Fear to see if your pap can be done here.  Breast exam is negative for mass  Flonase sent in for allergies

## 2013-02-12 NOTE — Progress Notes (Signed)
Subjective:    Patient ID: Jennifer [name], female    DOB: Jun 28, 1976, 37 y.o.   MRN: [id number]  HPI The PT is here for follow up and re-evaluation of chronic medical conditions, medication management and review of any available recent lab and radiology data.  Preventive health is updated, specifically  Cancer screening and Immunization.   Questions or concerns regarding consultations or procedures which the PT has had in the interim are  addressed. The PT denies any adverse reactions to current medications since the last visit.  3 days ago experienced left breast pain fleetingly, not much caffeine use.Concerned , wants breast exam, has maternal aunt who had breast ca. Denies nipple discharge, change n breast skin, no mass   Wants eval for gastric bypass  Increased allergy symptoms in past week and anticipate worsening wants med  For this     Review of Systems See HPI Denies recent fever or chills. Denies sinus pressure, , ear pain or sore throat. Denies chest congestion, productive cough or wheezing. Denies chest pains, palpitations and leg swelling Denies abdominal pain, nausea, vomiting,diarrhea or constipation.   Denies dysuria, frequency, hesitancy or incontinence. Denies joint pain, swelling and limitation in mobility. Denies headaches, seizures, numbness, or tingling. Denies depression, anxiety or insomnia. Denies skin break down or rash.        Objective:   Physical Exam Patient alert and oriented and in no cardiopulmonary distress.  HEENT: No facial asymmetry, EOMI, no sinus tenderness,  oropharynx pink and moist.  Neck supple no adenopathy.Erythema and edema of nasal mucosa  Chest: Clear to auscultation bilaterally. Breast: no asymnetry, no mas, no nipple d/c, no axillary or supraclavicular lymph nodes CVS: S1, S2 no murmurs, no S3.  ABD: Soft non tender. Bowel sounds normal.  Ext: No edema  MS: Adequate ROM spine, shoulders, hips and knees.  Skin: Intact,  no ulcerations or rash noted.  Psych: Good eye contact, normal affect. Memory intact not anxious or depressed appearing.  CNS: CN 2-12 intact, power, tone and sensation normal throughout.        Assessment & Plan:

## 2013-02-25 NOTE — Assessment & Plan Note (Signed)
Increased symptoms as season is changing , she is to start daily med

## 2013-02-25 NOTE — Assessment & Plan Note (Addendum)
unchanged Patient re-educated about  the importance of commitment to a  minimum of 150 minutes of exercise per week. The importance of healthy food choices with portion control discussed. Encouraged to start a food diary, count calories and to consider  joining a support group. Sample diet sheets offered. Goals set by the patient for the next several months. Pt is ready for definitve management by surgery, she has battled morbid obesity for years , and now has worsening prediabetes, she is a good candidate and I believe that she will do well

## 2013-02-25 NOTE — Assessment & Plan Note (Signed)
Deteriorated Patient educated about the importance of limiting  Carbohydrate intake , the need to commit to daily physical activity for a minimum of 30 minutes , and to commit weight loss. The fact that changes in all these areas will reduce or eliminate all together the development of diabetes is stressed.    

## 2013-09-25 ENCOUNTER — Encounter (INDEPENDENT_AMBULATORY_CARE_PROVIDER_SITE_OTHER): Payer: Self-pay

## 2013-09-25 ENCOUNTER — Ambulatory Visit (INDEPENDENT_AMBULATORY_CARE_PROVIDER_SITE_OTHER): Payer: BC Managed Care – PPO | Admitting: General Practice

## 2013-09-25 VITALS — BP 133/89 | HR 94 | Temp 98.3°F | Wt 323.0 lb

## 2013-09-25 DIAGNOSIS — T148XXA Other injury of unspecified body region, initial encounter: Secondary | ICD-10-CM

## 2013-09-25 MED ORDER — IBUPROFEN 800 MG PO TABS: 800.0000 mg | ORAL_TABLET | Freq: Three times a day (TID) | ORAL | Status: DC | PRN

## 2013-09-25 MED ORDER — CYCLOBENZAPRINE HCL 10 MG PO TABS: 10.0000 mg | ORAL_TABLET | Freq: Two times a day (BID) | ORAL | Status: DC | PRN

## 2013-09-25 NOTE — Patient Instructions (Signed)
Muscle Strain  Muscle strain occurs when a muscle is stretched beyond its normal length. A small number of muscle fibers generally are torn. This is especially common in athletes. This happens when a sudden, violent force placed on a muscle stretches it too far. Usually, recovery from muscle strain takes 1 to 2 weeks. Complete healing will take 5 to 6 weeks.   HOME CARE INSTRUCTIONS    While awake, apply ice to the sore muscle for the first 2 days after the injury.   Put ice in a plastic bag.   Place a towel between your skin and the bag.   Leave the ice on for 15-20 minutes each hour.   Do not use the strained muscle for several days, until you no longer have pain.   You may wrap the injured area with an elastic bandage for comfort. Be careful not to wrap it too tightly. This may interfere with blood circulation or increase swelling.   Only take over-the-counter or prescription medicines for pain, discomfort, or fever as directed by your caregiver.  SEEK MEDICAL CARE IF:   You have increasing pain or swelling in the injured area.  MAKE SURE YOU:    Understand these instructions.   Will watch your condition.   Will get help right away if you are not doing well or get worse.  Document Released: 11/01/2005 Document Revised: 01/24/2012 Document Reviewed: 11/13/2011  [employer] Patient Information 2014 [employer]

## 2013-09-26 NOTE — Progress Notes (Signed)
Subjective:    Patient ID: Jennifer [name], female    DOB: [birth year], 37 y.o.   MRN: [id number]  HPI Patient presents today with complaints of left upper chest pain. She denies chest pressure or radiating of pain. Anelle reports starting a work out routine and some weight training several days ago, the onset of her discomfort was shortly after.     Review of Systems  Constitutional: Negative for fever and chills.  Respiratory: Negative for chest tightness and shortness of breath.   Cardiovascular: Negative for chest pain and palpitations.  Musculoskeletal: Positive for myalgias.       Left upper chest tenderness  Neurological: Negative for dizziness, weakness and headaches.       Objective:   Physical Exam  Constitutional: She is oriented to person, place, and time. She appears well-developed and well-nourished.  Cardiovascular: Normal rate, regular rhythm and normal heart sounds.   Pulmonary/Chest: Effort normal and breath sounds normal. No respiratory distress. She exhibits no tenderness.  Musculoskeletal: She exhibits tenderness.  Left pectoralis area tenderness with palpaiton and tightness noted  Neurological: She is alert and oriented to person, place, and time.  Skin: Skin is warm and dry.  Psychiatric: She has a normal mood and affect.          Assessment & Plan:  1. Muscle strain  - cyclobenzaprine (FLEXERIL) 10 MG tablet; Take 1 tablet (10 mg total) by mouth 2 (two) times daily as needed for muscle spasms.  Dispense: 20 tablet; Refill: 0 - ibuprofen (ADVIL,MOTRIN) 800 MG tablet; Take 1 tablet (800 mg total) by mouth every 8 (eight) hours as needed.  Dispense: 20 tablet; Refill: 0 -information sheet provided and discussed on muscle strain -Patient verbalized understanding -Coralie Keens, FNP-C

## 2013-11-05 ENCOUNTER — Other Ambulatory Visit: Payer: Self-pay

## 2013-11-05 DIAGNOSIS — R7303 Prediabetes: Secondary | ICD-10-CM

## 2013-11-05 MED ORDER — METFORMIN HCL 500 MG PO TABS: 500.0000 mg | ORAL_TABLET | Freq: Three times a day (TID) | ORAL | Status: DC

## 2013-11-26 ENCOUNTER — Telehealth: Payer: Self-pay

## 2013-11-26 DIAGNOSIS — R7303 Prediabetes: Secondary | ICD-10-CM

## 2013-11-26 NOTE — Telephone Encounter (Signed)
Patient had lipid, cbc, tsh, a1c, and bmp 01/2013.  All labs normal other than a1c at 6.0.  Please advise what labs should be ordered.

## 2013-11-26 NOTE — Telephone Encounter (Signed)
Please review this and get this request to the appropriate provider

## 2013-11-27 NOTE — Addendum Note (Signed)
Addended by: Eual Fines on: 11/27/2013 04:45 PM   Modules accepted: Orders

## 2013-11-27 NOTE — Telephone Encounter (Signed)
This message was routed to wrong Dr by mistake. Lab ordered and faxed to lab per request

## 2013-11-27 NOTE — Telephone Encounter (Signed)
HBA1C non fast  pls sort this out Jennifer [name] , who is natalie rutherford , not sure why sent to me!

## 2013-11-27 NOTE — Telephone Encounter (Signed)
This pt is seen by Dr Moshe Cipro

## 2013-11-29 ENCOUNTER — Ambulatory Visit (INDEPENDENT_AMBULATORY_CARE_PROVIDER_SITE_OTHER): Payer: BC Managed Care – PPO | Admitting: Family Medicine

## 2013-11-29 ENCOUNTER — Encounter: Payer: Self-pay | Admitting: Family Medicine

## 2013-11-29 ENCOUNTER — Encounter (INDEPENDENT_AMBULATORY_CARE_PROVIDER_SITE_OTHER): Payer: Self-pay

## 2013-11-29 VITALS — BP 116/84 | HR 99 | Resp 18 | Ht 67.0 in | Wt 323.0 lb

## 2013-11-29 DIAGNOSIS — N3000 Acute cystitis without hematuria: Secondary | ICD-10-CM | POA: Insufficient documentation

## 2013-11-29 DIAGNOSIS — B977 Papillomavirus as the cause of diseases classified elsewhere: Secondary | ICD-10-CM

## 2013-11-29 DIAGNOSIS — IMO0002 Reserved for concepts with insufficient information to code with codable children: Secondary | ICD-10-CM | POA: Insufficient documentation

## 2013-11-29 DIAGNOSIS — F3289 Other specified depressive episodes: Secondary | ICD-10-CM

## 2013-11-29 DIAGNOSIS — R7309 Other abnormal glucose: Secondary | ICD-10-CM

## 2013-11-29 DIAGNOSIS — J309 Allergic rhinitis, unspecified: Secondary | ICD-10-CM

## 2013-11-29 DIAGNOSIS — R7303 Prediabetes: Secondary | ICD-10-CM

## 2013-11-29 DIAGNOSIS — F329 Major depressive disorder, single episode, unspecified: Secondary | ICD-10-CM

## 2013-11-29 DIAGNOSIS — R8789 Other abnormal findings in specimens from female genital organs: Secondary | ICD-10-CM

## 2013-11-29 LAB — POCT URINALYSIS DIPSTICK
BILIRUBIN UA: NEGATIVE
Glucose, UA: NEGATIVE
Ketones, UA: NEGATIVE
NITRITE UA: NEGATIVE
PH UA: 6.5
PROTEIN UA: NEGATIVE
Spec Grav, UA: 1.025
Urobilinogen, UA: 0.2

## 2013-11-29 LAB — HEMOGLOBIN A1C
Hgb A1c MFr Bld: 6 % — ABNORMAL HIGH (ref <5.7)
Mean Plasma Glucose: 126 mg/dL — ABNORMAL HIGH (ref <117)

## 2013-11-29 MED ORDER — PHENTERMINE HCL 37.5 MG PO TABS: ORAL_TABLET | ORAL | Status: DC

## 2013-11-29 MED ORDER — VENLAFAXINE HCL ER 37.5 MG PO CP24: 37.5000 mg | ORAL_CAPSULE | Freq: Every day | ORAL | Status: DC

## 2013-11-29 MED ORDER — SULFAMETHOXAZOLE-TMP DS 800-160 MG PO TABS: 1.0000 | ORAL_TABLET | Freq: Two times a day (BID) | ORAL | Status: DC

## 2013-11-29 MED ORDER — FLUTICASONE PROPIONATE 50 MCG/ACT NA SUSP: 2.0000 | Freq: Every day | NASAL | Status: DC

## 2013-11-29 NOTE — Patient Instructions (Signed)
F/u in 2.5 month, call if you need me before  Flu vaccine today  New for depression is effexor one daily, and for help with weight loss is phentermine half daily, you will get the script for the phentermine  You are referred to Dr Johnnye Sima office , it is EXTREMELY important that you keep the appointment , as you understand  Flonase and antibiotic sent in for apparent UTI  You will be contacted after blood test is reviewed, to see if the metformin dose will remain at 3 per day or be increased to 4 per day  Please keep motivated as far as good health habits and walking are concerned

## 2013-11-30 ENCOUNTER — Other Ambulatory Visit: Payer: Self-pay | Admitting: Family Medicine

## 2013-12-02 LAB — URINE CULTURE: Colony Count: 40000

## 2013-12-02 NOTE — Assessment & Plan Note (Signed)
Increased symptoms, start flonase

## 2013-12-02 NOTE — Assessment & Plan Note (Signed)
Not suicidal or homicidal, start effexor

## 2013-12-02 NOTE — Assessment & Plan Note (Signed)
Patient educated about the importance of limiting  Carbohydrate intake , the need to commit to daily physical activity for a minimum of 30 minutes , and to commit weight loss. The fact that changes in all these areas will reduce or eliminate all together the development of diabetes is stressed.   upodated lan unchanged, will inc dose of metformin

## 2013-12-02 NOTE — Assessment & Plan Note (Signed)
Deteriorated. Patient re-educated about  the importance of commitment to a  minimum of 150 minutes of exercise per week. The importance of healthy food choices with portion control discussed. Encouraged to start a food diary, count calories and to consider  joining a support group. Sample diet sheets offered. Goals set by the patient for the next several months.    

## 2013-12-02 NOTE — Assessment & Plan Note (Signed)
Pt failed to keep gyne f/u, likely "afraid" now ready to go, she is referred again ALSO importance of keeping appt is stressed will f/u

## 2013-12-02 NOTE — Assessment & Plan Note (Signed)
Symptomatic with abn Ua, will f/u c/s start septra

## 2013-12-02 NOTE — Progress Notes (Signed)
Subjective:    Patient ID: Jennifer [name], female    DOB: 04-14-1976, 38 y.o.   MRN: [id number]  HPI The PT is here for follow up and re-evaluation of chronic medical conditions, medication management and review of any available recent lab and radiology data.  Preventive health is updated, specifically  Cancer screening and Immunization.  Still needs gyne f/u for abn pap with positive HPV, pt failed to keep f/u to date  Was seen at another primary care facility in the Fall for back spasm which has resolved, states this remains her primary care facility. Increased anxiety and uncontrolled depression, afraid to die , afraid to keep health care appts. Not suicidal or homicidal, her children keep her motivation for living and taking care of herself at a high level.  Wants to lose weight , not interested in surgery, in the past 2 weeks committed to walking in the home and dietary change, requests appetite suppressant which has helped in the past 5 day h/o burning and frequency of urination , no fever, chills or flank pain      Review of Systems See HPI Denies recent fever or chills. Denies sinus pressure, nasal congestion, ear pain or sore throat. Denies chest congestion, productive cough or wheezing. Denies chest pains, palpitations and leg swelling Denies abdominal pain, nausea, vomiting,diarrhea or constipation.   Denies joint pain, swelling and limitation in mobility. Denies headaches, seizures, numbness, or tingling.  Denies skin break down or rash.        Objective:   Physical Exam  Patient alert and oriented and in no cardiopulmonary distress.  HEENT: No facial asymmetry, EOMI, no sinus tenderness,  oropharynx pink and moist.  Neck supple no adenopathy.  Chest: Clear to auscultation bilaterally.  CVS: S1, S2 no murmurs, no S3.  ABD: Soft no suprapubic or renal angle tenderness  Ext: No edema  MS: Adequate ROM spine, shoulders, hips and knees.  Skin: Intact, no  ulcerations or rash noted.  Psych: Good eye contact, normal affect. Memory intact not anxious mildly epressed appearing and tearful at times  CNS: CN 2-12 intact, power, tone and sensation normal throughout.       Assessment & Plan:

## 2013-12-03 ENCOUNTER — Other Ambulatory Visit: Payer: Self-pay

## 2013-12-03 DIAGNOSIS — N3000 Acute cystitis without hematuria: Secondary | ICD-10-CM

## 2013-12-03 MED ORDER — METFORMIN HCL ER (OSM) 1000 MG PO TB24: 1000.0000 mg | ORAL_TABLET | Freq: Every day | ORAL | Status: DC

## 2013-12-03 MED ORDER — LEVOFLOXACIN 500 MG PO TABS: 500.0000 mg | ORAL_TABLET | Freq: Every day | ORAL | Status: DC

## 2013-12-04 ENCOUNTER — Telehealth: Payer: Self-pay

## 2013-12-04 NOTE — Telephone Encounter (Signed)
Try to see if she can fill without using her card, ask the pharmacist, if pt has not been taking two daily then needs to do this keep at old dose, her BG has to increase

## 2013-12-09 ENCOUNTER — Other Ambulatory Visit: Payer: Self-pay | Admitting: Family Medicine

## 2013-12-10 ENCOUNTER — Ambulatory Visit: Payer: BC Managed Care – PPO | Admitting: Obstetrics and Gynecology

## 2013-12-12 NOTE — Telephone Encounter (Signed)
Attempted to call patient and phone rang back with modem sound.  Will send letter for patient to contact office.

## 2013-12-13 ENCOUNTER — Ambulatory Visit (INDEPENDENT_AMBULATORY_CARE_PROVIDER_SITE_OTHER): Payer: BC Managed Care – PPO | Admitting: Obstetrics and Gynecology

## 2013-12-13 ENCOUNTER — Encounter: Payer: Self-pay | Admitting: Obstetrics and Gynecology

## 2013-12-13 ENCOUNTER — Other Ambulatory Visit (HOSPITAL_COMMUNITY)
Admission: RE | Admit: 2013-12-13 | Discharge: 2013-12-13 | Disposition: A | Discharge status: Home / Self Care | Payer: BC Managed Care – PPO | Source: Ambulatory Visit | Attending: Obstetrics and Gynecology | Admitting: Obstetrics and Gynecology

## 2013-12-13 VITALS — BP 120/82 | Ht 67.0 in | Wt 316.8 lb

## 2013-12-13 DIAGNOSIS — N898 Other specified noninflammatory disorders of vagina: Secondary | ICD-10-CM

## 2013-12-13 DIAGNOSIS — Z01419 Encounter for gynecological examination (general) (routine) without abnormal findings: Secondary | ICD-10-CM

## 2013-12-13 DIAGNOSIS — Z1151 Encounter for screening for human papillomavirus (HPV): Secondary | ICD-10-CM | POA: Insufficient documentation

## 2013-12-13 DIAGNOSIS — R87810 Cervical high risk human papillomavirus (HPV) DNA test positive: Secondary | ICD-10-CM | POA: Insufficient documentation

## 2013-12-13 DIAGNOSIS — R87619 Unspecified abnormal cytological findings in specimens from cervix uteri: Secondary | ICD-10-CM

## 2013-12-13 DIAGNOSIS — Z113 Encounter for screening for infections with a predominantly sexual mode of transmission: Secondary | ICD-10-CM | POA: Insufficient documentation

## 2013-12-13 DIAGNOSIS — Z124 Encounter for screening for malignant neoplasm of cervix: Secondary | ICD-10-CM | POA: Insufficient documentation

## 2013-12-13 LAB — POCT WET PREP WITH KOH
Bacteria Wet Prep HPF POC: NORMAL
CLUE CELLS WET PREP PER HPF POC: NEGATIVE
EPITHELIAL WET PREP PER HPF POC: NORMAL
KOH Prep POC: NEGATIVE
Trichomonas, UA: NEGATIVE
Yeast Wet Prep HPF POC: NEGATIVE

## 2013-12-13 NOTE — Progress Notes (Signed)
This chart was transcribed for Dr. Mallory Shirk by Ludger Nutting, ED scribe.    [facility] Visit  Patient name: Jennifer [name] MRN [id number]  Date of birth: 10/15/1976  CC & HPI:  Jennifer [name] is a 38 y.o. female presenting today for follow up after an abnormal pap smear in August 2013. LOW GRADE SQUAMOUS INTRAEPITHELIAL LESION: CIN-1/ HPV (LSIL). She was to follow up in 6 months but has not done so until today. She would also like to be tested for STI's.   ROS:  Negative.   Pertinent History Reviewed:  Medical & Surgical Hx:  Reviewed: Significant for   Past Medical History  Diagnosis Date  . Family history of diabetes mellitus   . History of recurrent UTIs   . Obesity   . Depression   . Alopecia   . Abnormal vaginal Pap smear     Past Surgical History  Procedure Laterality Date  . Appendectomy    . Knee arthroscopy      Medications: Reviewed & Updated - see associated section Social History: Reviewed -  reports that she has never smoked. She has never used smokeless tobacco.  Objective Findings:  Vitals: BP 120/82  Ht 5\' 7"  (1.702 m)  Wt 316 lb 12.8 oz (143.7 kg)  BMI 49.61 kg/m2  LMP 10/29/2013  Physical Examination: General appearance - alert, well appearing, and in no distress and oriented to person, place, and time Pelvic - VULVA: normal appearing vulva with no masses, tenderness or lesions,  VAGINA: normal appearing vagina with normal color and discharge, no lesions,  CERVIX: normal appearing cervix without discharge or lesions IUD STRING VISUALIZED , UTERUS: uterus is normal size, shape, consistency and nontender,  ADNEXA: normal adnexa in size, nontender and no masses   Assessment & Plan:   A:  1. Follow up for abnormal pap smear  P: PAP Q YR TIL NORMAL X 3 then q 3 yr     HIV  rpr, hepC test today

## 2013-12-13 NOTE — Patient Instructions (Signed)
Call for results 1 wk if we haven't been able to call you

## 2013-12-13 NOTE — Addendum Note (Signed)
Addended by: Linton Rump on: 12/13/2013 12:25 PM   Modules accepted: Orders

## 2013-12-14 LAB — HIV ANTIBODY (ROUTINE TESTING W REFLEX): HIV: NONREACTIVE

## 2013-12-14 LAB — RPR

## 2013-12-14 LAB — HEPATITIS C ANTIBODY: HCV Ab: NEGATIVE

## 2013-12-17 ENCOUNTER — Telehealth: Payer: Self-pay | Admitting: Family Medicine

## 2013-12-19 NOTE — Telephone Encounter (Signed)
Spoke with patient and addressed concern.

## 2013-12-19 NOTE — Telephone Encounter (Signed)
Patient is aware that due to cost she can continue Metformin 500mg  3x daily.

## 2013-12-24 ENCOUNTER — Telehealth: Payer: Self-pay | Admitting: *Deleted

## 2013-12-24 NOTE — Progress Notes (Signed)
[name]PT

## 2013-12-24 NOTE — Telephone Encounter (Signed)
Message copied by Doyne Keel on Mon Dec 24, 2013  9:12 AM ------      Message from: Jonnie Kind      Created: Mon Dec 24, 2013  7:53 AM       Ascus + HPV, after a long delay in followup from 2013 LSIL on Colposcopy. Will need repeat colposcopy ------

## 2013-12-25 NOTE — Telephone Encounter (Signed)
Pt informed of abnormal pap (ASCUS and +HPV) from 12/13/13. Colposcopy scheduled with Dr. Glo Herring.

## 2013-12-25 NOTE — Telephone Encounter (Signed)
Message copied by Doyne Keel on Tue Dec 25, 2013  9:30 AM ------      Message from: Jonnie Kind      Created: Mon Dec 24, 2013  7:53 AM       Ascus + HPV, after a long delay in followup from 2013 LSIL on Colposcopy. Will need repeat colposcopy ------

## 2013-12-31 ENCOUNTER — Encounter: Payer: BC Managed Care – PPO | Admitting: Obstetrics and Gynecology

## 2014-01-05 ENCOUNTER — Other Ambulatory Visit: Payer: Self-pay | Admitting: Family Medicine

## 2014-01-23 ENCOUNTER — Other Ambulatory Visit: Payer: Self-pay | Admitting: Obstetrics and Gynecology

## 2014-01-23 ENCOUNTER — Encounter (INDEPENDENT_AMBULATORY_CARE_PROVIDER_SITE_OTHER): Payer: Self-pay

## 2014-01-23 ENCOUNTER — Ambulatory Visit (INDEPENDENT_AMBULATORY_CARE_PROVIDER_SITE_OTHER): Payer: BC Managed Care – PPO | Admitting: Obstetrics and Gynecology

## 2014-01-23 ENCOUNTER — Encounter: Payer: Self-pay | Admitting: Obstetrics and Gynecology

## 2014-01-23 VITALS — BP 120/84 | Ht 67.0 in | Wt 312.0 lb

## 2014-01-23 DIAGNOSIS — Z124 Encounter for screening for malignant neoplasm of cervix: Secondary | ICD-10-CM

## 2014-01-23 DIAGNOSIS — N760 Acute vaginitis: Secondary | ICD-10-CM

## 2014-01-23 DIAGNOSIS — R87810 Cervical high risk human papillomavirus (HPV) DNA test positive: Secondary | ICD-10-CM

## 2014-01-23 DIAGNOSIS — Z3202 Encounter for pregnancy test, result negative: Secondary | ICD-10-CM

## 2014-01-23 DIAGNOSIS — Z3200 Encounter for pregnancy test, result unknown: Secondary | ICD-10-CM

## 2014-01-23 DIAGNOSIS — B9689 Other specified bacterial agents as the cause of diseases classified elsewhere: Secondary | ICD-10-CM

## 2014-01-23 DIAGNOSIS — N898 Other specified noninflammatory disorders of vagina: Secondary | ICD-10-CM

## 2014-01-23 DIAGNOSIS — R87610 Atypical squamous cells of undetermined significance on cytologic smear of cervix (ASC-US): Secondary | ICD-10-CM

## 2014-01-23 LAB — POCT WET PREP WITH KOH
Bacteria Wet Prep HPF POC: NORMAL
Clue Cells Wet Prep HPF POC: POSITIVE
Epithelial Wet Prep HPF POC: NORMAL
KOH Prep POC: NEGATIVE
TRICHOMONAS UA: NEGATIVE
YEAST WET PREP PER HPF POC: NEGATIVE

## 2014-01-23 LAB — POCT URINE PREGNANCY: Preg Test, Ur: NEGATIVE

## 2014-01-23 MED ORDER — METRONIDAZOLE 500 MG PO TABS: 500.0000 mg | ORAL_TABLET | Freq: Two times a day (BID) | ORAL | Status: DC

## 2014-01-23 NOTE — Patient Instructions (Addendum)
  Try to lose weight prior to trying to get pregnant. Will call with results of biopsy in one week. This should not interfere with plans for pregnancy.  Colposcopy Care After Colposcopy is a procedure in which a special tool is used to magnify the surface of the cervix. A tissue sample (biopsy) may also be taken. This sample will be looked at for cervical cancer or other problems. After the test:  You may have some cramping.  Lie down for a few minutes if you feel lightheaded.   You may have some bleeding which should stop in a few days. HOME CARE  Do not have sex or use tampons for 2 to 3 days or as told.  Only take medicine as told by your doctor.  Continue to take your birth control pills as usual. Finding out the results of your test Ask when your test results will be ready. Make sure you get your test results. GET HELP RIGHT AWAY IF:  You are bleeding a lot or are passing blood clots.  You develop a fever of 102 F (38.9 C) or higher.  You have abnormal vaginal discharge.  You have cramps that do not go away with medicine.  You feel lightheaded, dizzy, or pass out (faint). MAKE SURE YOU:   Understand these instructions.  Will watch your condition.  Will get help right away if you are not doing well or get worse. Document Released: 04/19/2008 Document Revised: 01/24/2012 Document Reviewed: 05/31/2013 [facility] Patient Information 2014 [place], [place].

## 2014-01-23 NOTE — Progress Notes (Signed)
This chart was scribed by Jenne Campus, Medical Scribe, for Dr. Mallory Shirk on 01/23/14 at 11:20 AM. This chart was reviewed by Dr. Mallory Shirk and is accurate.   Jennifer [name] 38 y.o. G1P1001 here for colposcopy for ATYPICAL SQUAMOUS CELLS OF UNDETERMINED SIGNIFICANCE (ASC-US). pap smear on 12/02/13  Discussed role for HPV in cervical dysplasia, need for surveillance. She also reports + vaginal discharge.  KOH prep and GC/CH probe obtained. Pt reports that she has an area in the perineum that started from the rectum which becomes irritated and drains occasionally.  + mild irritation with drainage currently. Denies any prior surgeries to the area. No f/u currently.  Prior cervix procedure while pregnant by Dr. Maudry Diego in [place].   H/o DM. On metformin and is actively trying to lose weight. Expresses desire for pregnancy.  Chaperone present for procedure which was done with pt's permission without severe discomfort. Patient given informed consent, signed copy in the chart, time out was performed.  Placed in lithotomy position. Cervix viewed with speculum and colposcope after application of acetic acid. Area along perineum that appears irritated. IUD in place. extrra long speculum and overlying condom to abduct vag sidewalls required  Colposcopy adequate? Yes  no visible lesions and no mosaicism; biopsies none   ECC specimen obtained.  specimens were labelled and sent to pathology.  Colposcopy IMPRESSION: no visible dysplasia,                                                 Cervical eversion                                                Hx Lsil with poor followup   Patient was given post procedure instructions. Will follow up pathology and manage accordingly.  Routine preventative health maintenance measures emphasized.

## 2014-01-24 LAB — GC/CHLAMYDIA PROBE AMP
CT PROBE, AMP APTIMA: NEGATIVE
GC Probe RNA: NEGATIVE

## 2014-01-25 ENCOUNTER — Telehealth: Payer: Self-pay | Admitting: Obstetrics and Gynecology

## 2014-01-25 NOTE — Telephone Encounter (Signed)
Pt informed of negative ECC, will get pap in a year.

## 2014-02-14 ENCOUNTER — Ambulatory Visit (INDEPENDENT_AMBULATORY_CARE_PROVIDER_SITE_OTHER): Payer: BC Managed Care – PPO | Admitting: Family Medicine

## 2014-02-14 ENCOUNTER — Encounter (INDEPENDENT_AMBULATORY_CARE_PROVIDER_SITE_OTHER): Payer: Self-pay

## 2014-02-14 ENCOUNTER — Encounter: Payer: Self-pay | Admitting: Gastroenterology

## 2014-02-14 ENCOUNTER — Encounter: Payer: Self-pay | Admitting: Family Medicine

## 2014-02-14 VITALS — BP 120/82 | HR 96 | Resp 16 | Wt 307.4 lb

## 2014-02-14 DIAGNOSIS — R5383 Other fatigue: Secondary | ICD-10-CM

## 2014-02-14 DIAGNOSIS — B977 Papillomavirus as the cause of diseases classified elsewhere: Secondary | ICD-10-CM

## 2014-02-14 DIAGNOSIS — IMO0002 Reserved for concepts with insufficient information to code with codable children: Secondary | ICD-10-CM

## 2014-02-14 DIAGNOSIS — K625 Hemorrhage of anus and rectum: Secondary | ICD-10-CM

## 2014-02-14 DIAGNOSIS — F329 Major depressive disorder, single episode, unspecified: Secondary | ICD-10-CM

## 2014-02-14 DIAGNOSIS — R7303 Prediabetes: Secondary | ICD-10-CM

## 2014-02-14 DIAGNOSIS — Z79899 Other long term (current) drug therapy: Secondary | ICD-10-CM

## 2014-02-14 DIAGNOSIS — R5381 Other malaise: Secondary | ICD-10-CM

## 2014-02-14 DIAGNOSIS — Z1211 Encounter for screening for malignant neoplasm of colon: Secondary | ICD-10-CM

## 2014-02-14 DIAGNOSIS — K649 Unspecified hemorrhoids: Secondary | ICD-10-CM

## 2014-02-14 DIAGNOSIS — R8789 Other abnormal findings in specimens from female genital organs: Secondary | ICD-10-CM

## 2014-02-14 DIAGNOSIS — F3289 Other specified depressive episodes: Secondary | ICD-10-CM

## 2014-02-14 DIAGNOSIS — Z800 Family history of malignant neoplasm of digestive organs: Secondary | ICD-10-CM

## 2014-02-14 DIAGNOSIS — R7309 Other abnormal glucose: Secondary | ICD-10-CM

## 2014-02-14 LAB — POC HEMOCCULT BLD/STL (OFFICE/1-CARD/DIAGNOSTIC): Fecal Occult Blood, POC: POSITIVE

## 2014-02-14 MED ORDER — HYDROCORTISONE ACE-PRAMOXINE 1-1 % RE FOAM: 1.0000 | Freq: Two times a day (BID) | RECTAL | Status: DC

## 2014-02-14 MED ORDER — PHENTERMINE HCL 37.5 MG PO TABS: ORAL_TABLET | ORAL | Status: DC

## 2014-02-14 NOTE — Patient Instructions (Addendum)
F/u in 2.5 month, call if you need me before  Congrats on excellent weight loss, keep it up.  HBA1C, chem 7 , lipid, CBC fasting in 2.5 month  Proctoto foam for hemorhoids and you are also referred to Dr Oneida Alar (GI specialist ) re painless rectal, bleeding

## 2014-02-14 NOTE — Progress Notes (Signed)
Subjective:    Patient ID: Jennifer [name], female    DOB: 05-15-1976, 38 y.o.   MRN: [id number]  HPI BRRB x 5 days painless no straining, first time ever, positive f/o intestinal ca as a cause of death Excellent response to phentermine and effexor, but difficulty with poor sleep. Comitted to regular exercise, and is happy with current weight loss   Review of Systems See HPI Denies recent fever or chills. Denies sinus pressure, nasal congestion, ear pain or sore throat. Denies chest congestion, productive cough or wheezing. Denies chest pains, palpitations and leg swelling Denies abdominal pain, nausea, vomiting,diarrhea or constipation.   Denies dysuria, frequency, hesitancy or incontinence. Denies joint pain, swelling and limitation in mobility. Denies headaches, seizures, numbness, or tingling. Denies depression, anxiety or insomnia. Denies skin break down or rash.        Objective:   Physical Exam BP 120/82  Pulse 96  Resp 16  Wt 307 lb 6.4 oz (139.436 kg)  SpO2 97%  LMP 12/16/2013 Patient alert and oriented and in no cardiopulmonary distress.  HEENT: No facial asymmetry, EOMI, no sinus tenderness,  oropharynx pink and moist.  Neck supple no adenopathy.  Chest: Clear to auscultation bilaterally.  CVS: S1, S2 no murmurs, no S3.  ABD: Soft non tender. Bowel sounds normal. Rectal: swollen piles, heme positive stool Ext: No edema  MS: Adequate ROM spine, shoulders, hips and knees.  Skin: Intact, no ulcerations or rash noted.  Psych: Good eye contact, normal affect. Memory intact not anxious or depressed appearing.  CNS: CN 2-12 intact, power, tone and sensation normal throughout.        Assessment & Plan:  Rectal bleeding New onset painless BRRB, pt referred to GI for further eval, on exam she has swollen hemmorhoids and topical med prescribed also  Piles (hemorrhoids) Topical med prescribed, veins are swollen though pt reports painless defecation with  no straining and sot stool  Morbid obesity Improved. Pt applauded on succesful weight loss through lifestyle change, and encouraged to continue same. Weight loss goal set for the next several months.   Prediabetes Updated lab needed at/ before next visit. Patient educated about the importance of limiting  Carbohydrate intake , the need to commit to daily physical activity for a minimum of 30 minutes , and to commit weight loss. The fact that changes in all these areas will reduce or eliminate all together the development of diabetes is stressed.     DEPRESSION Improved markedly with medication, pt to continue same  Abnormal finding on Pap smear, HPV DNA positive Has had gyne re eval since last visit and will have further follow up in the near future  FHx: cancer of digestive organ Pt uncertain of location of primary but knows that her mother who is deceased had intestinal cancer as her cause of death

## 2014-02-16 DIAGNOSIS — Z800 Family history of malignant neoplasm of digestive organs: Secondary | ICD-10-CM | POA: Insufficient documentation

## 2014-02-16 NOTE — Assessment & Plan Note (Signed)
Improved markedly with medication, pt to continue same

## 2014-02-16 NOTE — Assessment & Plan Note (Signed)
Has had gyne re eval since last visit and will have further follow up in the near future

## 2014-02-16 NOTE — Assessment & Plan Note (Signed)
Updated lab needed at/ before next visit. Patient educated about the importance of limiting  Carbohydrate intake , the need to commit to daily physical activity for a minimum of 30 minutes , and to commit weight loss. The fact that changes in all these areas will reduce or eliminate all together the development of diabetes is stressed.    

## 2014-02-16 NOTE — Assessment & Plan Note (Addendum)
Topical med prescribed, veins are swollen though pt reports painless defecation with no straining and sot stool

## 2014-02-16 NOTE — Assessment & Plan Note (Signed)
Improved. Pt applauded on succesful weight loss through lifestyle change, and encouraged to continue same. Weight loss goal set for the next several months.  

## 2014-02-16 NOTE — Assessment & Plan Note (Signed)
Pt uncertain of location of primary but knows that her mother who is deceased had intestinal cancer as her cause of death

## 2014-02-16 NOTE — Assessment & Plan Note (Signed)
New onset painless BRRB, pt referred to GI for further eval, on exam she has swollen hemmorhoids and topical med prescribed also

## 2014-02-18 ENCOUNTER — Other Ambulatory Visit: Payer: Self-pay

## 2014-02-18 ENCOUNTER — Telehealth: Payer: Self-pay | Admitting: Family Medicine

## 2014-02-18 DIAGNOSIS — K649 Unspecified hemorrhoids: Secondary | ICD-10-CM

## 2014-02-18 MED ORDER — HYDROCORTISONE ACE-PRAMOXINE 1-1 % RE FOAM: 1.0000 | Freq: Two times a day (BID) | RECTAL | Status: DC

## 2014-02-18 NOTE — Telephone Encounter (Signed)
Checked her formulary and can't find anything cheaper. [place] makes their own hemorrhoid cream but it was $50. Pt is going to check price at [facility] or use something OTC

## 2014-02-18 NOTE — Telephone Encounter (Signed)
pls do send in whatever is affordable and covered by her insurance for hemorhoids twice daily application pls

## 2014-02-26 ENCOUNTER — Other Ambulatory Visit: Payer: Self-pay | Admitting: Family Medicine

## 2014-02-27 ENCOUNTER — Encounter: Payer: Self-pay | Admitting: Gastroenterology

## 2014-02-27 ENCOUNTER — Ambulatory Visit (INDEPENDENT_AMBULATORY_CARE_PROVIDER_SITE_OTHER): Payer: BC Managed Care – PPO | Admitting: Gastroenterology

## 2014-02-27 ENCOUNTER — Other Ambulatory Visit: Payer: Self-pay | Admitting: Internal Medicine

## 2014-02-27 VITALS — BP 120/80 | HR 75 | Temp 98.4°F | Ht 67.0 in | Wt 308.4 lb

## 2014-02-27 DIAGNOSIS — K625 Hemorrhage of anus and rectum: Secondary | ICD-10-CM

## 2014-02-27 MED ORDER — PEG 3350-KCL-NA BICARB-NACL 420 G PO SOLR: 4000.0000 mL | ORAL | Status: DC

## 2014-02-27 NOTE — Progress Notes (Signed)
cc'd to pcp 

## 2014-02-27 NOTE — Assessment & Plan Note (Signed)
Recent onset small volume rectal bleeding with reported hemorrhoids on exam. Likely bleeding due to benign anorectal source. Continue topical therapy. Plan on colonoscopy for diagnostic purposes with Dr. Gala Romney in the near future.  I have discussed the risks, alternatives, benefits with regards to but not limited to the risk of reaction to medication, bleeding, infection, perforation and the patient is agreeable to proceed. Written consent to be obtained.

## 2014-02-27 NOTE — Progress Notes (Signed)
Primary Care Physician:  Tula Nakayama, MD  Primary Gastroenterologist:  Garfield Cornea, MD   Chief Complaint  Patient presents with  . Rectal Bleeding  . Hemorrhoids    HPI:  Jennifer [name] is a 38 y.o. female here at the request of Dr. Moshe Cipro for further evaluation of rectal bleeding.  Saw Dr. Moshe Cipro. On exam felt to be due to hemorrhoids. On hemorrhoid foam for one week so far. Last bleeding was this morning. Alternating constipation and diarrhea. Usually okay if watches diet and walks. Denies any abdominal pain, nausea, vomiting, heartburn, dysphagia. She is on phentermine for weight loss purposes.  Current Outpatient Prescriptions  Medication Sig Dispense Refill  . hydrocortisone-pramoxine (PROCTOFOAM HC) rectal foam Place 1 applicator rectally 2 (two) times daily.  10 g  0  . metFORMIN (GLUCOPHAGE) 500 MG tablet TAKE 1 TABLET (500 MG TOTAL) BY MOUTH 3 (THREE) TIMES DAILY WITH MEALS.  90 tablet  2  . phentermine (ADIPEX-P) 37.5 MG tablet Half tablet once daily at breakfast  15 tablet  1  . venlafaxine XR (EFFEXOR-XR) 37.5 MG 24 hr capsule Take 1 capsule (37.5 mg total) by mouth daily with breakfast.  30 capsule  5   No current facility-administered medications for this visit.    Allergies as of 02/27/2014  . (No Known Allergies)    Past Medical History  Diagnosis Date  . Family history of diabetes mellitus   . History of recurrent UTIs   . Obesity   . Depression   . Alopecia   . Abnormal vaginal Pap smear     Past Surgical History  Procedure Laterality Date  . Appendectomy    . Knee arthroscopy      right    Family History  Problem Relation Age of Onset  . Adopted: Yes  . Hypertension Mother   . Diabetes Mother   . Other Mother     cholangiocarcinoma, age 80, deceased    History   Social History  . Marital Status: Single    Spouse Name: N/A    Number of Children: 4  . Years of Education: N/A   Occupational History  . UNEMPLOYWED SINCE 10/2010     Social History Main Topics  . Smoking status: Never Smoker   . Smokeless tobacco: Never Used  . Alcohol Use: No  . Drug Use: No  . Sexual Activity: Not Currently    Birth Control/ Protection: IUD, Condom   Other Topics Concern  . Not on file   Social History Narrative  . No narrative on file      ROS:  General: Negative for anorexia, unintentional weight loss, fever, chills, fatigue, weakness. Eyes: Negative for vision changes.  ENT: Negative for hoarseness, difficulty swallowing , nasal congestion. CV: Negative for chest pain, angina, palpitations, dyspnea on exertion, peripheral edema.  Respiratory: Negative for dyspnea at rest, dyspnea on exertion, cough, sputum, wheezing.  GI: See history of present illness. GU:  Negative for dysuria, hematuria, urinary incontinence, urinary frequency, nocturnal urination.  MS: Negative for joint pain, low back pain.  Derm: Negative for rash or itching.  Neuro: Negative for weakness, abnormal sensation, seizure, frequent headaches, memory loss, confusion.  Psych: Negative for anxiety, depression, suicidal ideation, hallucinations.  Endo: Negative for unusual weight change.  Heme: Negative for bruising or bleeding. Allergy: Negative for rash or hives.    Physical Examination:  BP 120/80  Pulse 75  Temp(Src) 98.4 F (36.9 C) (Oral)  Ht 5\' 7"  (1.702 m)  Wt 308  lb 6.4 oz (139.889 kg)  BMI 48.29 kg/m2  LMP 02/06/2014   General: Well-nourished, well-developed in no acute distress.  Head: Normocephalic, atraumatic.   Eyes: Conjunctiva pink, no icterus. Mouth: Oropharyngeal mucosa moist and pink , no lesions erythema or exudate. Neck: Supple without thyromegaly, masses, or lymphadenopathy.  Lungs: Clear to auscultation bilaterally.  Heart: Regular rate and rhythm, no murmurs rubs or gallops.  Abdomen: Bowel sounds are normal, nontender, nondistended, no hepatosplenomegaly or masses, no abdominal bruits or    hernia , no rebound or  guarding.   Rectal: Deferred Extremities: No lower extremity edema. No clubbing or deformities.  Neuro: Alert and oriented x 4 , grossly normal neurologically.  Skin: Warm and dry, no rash or jaundice.   Psych: Alert and cooperative, normal mood and affect.

## 2014-02-27 NOTE — Patient Instructions (Signed)
1. Complete hemorrhoid therapy. 2. Colonoscopy, see separate instructions.

## 2014-03-01 ENCOUNTER — Encounter (HOSPITAL_COMMUNITY): Payer: Self-pay | Admitting: Pharmacy Technician

## 2014-03-11 ENCOUNTER — Telehealth: Payer: Self-pay

## 2014-03-11 NOTE — Telephone Encounter (Signed)
Pt is aware the the new Rx is at [facility]

## 2014-03-11 NOTE — Telephone Encounter (Signed)
Pt called because her pharmacy did not have the Tri-Lyte in stock. I called in a new [facility] to [facility] [place]. She also had lost her instruction so I faxed her a new copy to her home fax [phone])

## 2014-03-12 ENCOUNTER — Telehealth: Payer: Self-pay

## 2014-03-12 NOTE — Telephone Encounter (Signed)
Talked with RMR and he said to tell her not to eat anymore noodles. She is aware not to eat anymore solid foods. She just feels weak. I told her to push the fluids and jello and pop-sicles. She understood.

## 2014-03-12 NOTE — Telephone Encounter (Signed)
Noted  

## 2014-03-12 NOTE — Telephone Encounter (Addendum)
Pt is calling because she ate the noodles out of the chicken noodle soup about half the can about 15-20 minutes ago . She is also drinking her prep. She is wanting to see if it is ok for her to continue on with the prep and have the TCS on Wednesday. Please advise

## 2014-03-13 ENCOUNTER — Encounter (HOSPITAL_COMMUNITY): Payer: Self-pay | Admitting: *Deleted

## 2014-03-13 ENCOUNTER — Ambulatory Visit (HOSPITAL_COMMUNITY)
Admission: RE | Admit: 2014-03-13 | Discharge: 2014-03-13 | Disposition: A | Discharge status: Home / Self Care | Payer: BC Managed Care – PPO | Source: Ambulatory Visit | Attending: Internal Medicine | Admitting: Internal Medicine

## 2014-03-13 ENCOUNTER — Encounter (HOSPITAL_COMMUNITY)
Admission: RE | Disposition: A | Discharge status: Home / Self Care | Payer: Self-pay | Source: Ambulatory Visit | Attending: Internal Medicine

## 2014-03-13 DIAGNOSIS — D126 Benign neoplasm of colon, unspecified: Secondary | ICD-10-CM | POA: Insufficient documentation

## 2014-03-13 DIAGNOSIS — K625 Hemorrhage of anus and rectum: Secondary | ICD-10-CM

## 2014-03-13 DIAGNOSIS — K644 Residual hemorrhoidal skin tags: Secondary | ICD-10-CM | POA: Insufficient documentation

## 2014-03-13 DIAGNOSIS — K5730 Diverticulosis of large intestine without perforation or abscess without bleeding: Secondary | ICD-10-CM | POA: Insufficient documentation

## 2014-03-13 DIAGNOSIS — K649 Unspecified hemorrhoids: Secondary | ICD-10-CM

## 2014-03-13 DIAGNOSIS — Z86010 Personal history of colonic polyps: Secondary | ICD-10-CM

## 2014-03-13 DIAGNOSIS — K921 Melena: Secondary | ICD-10-CM | POA: Insufficient documentation

## 2014-03-13 HISTORY — PX: COLONOSCOPY: SHX5424

## 2014-03-13 HISTORY — DX: Type 2 diabetes mellitus without complications: E11.9

## 2014-03-13 LAB — GLUCOSE, CAPILLARY: Glucose-Capillary: 83 mg/dL (ref 70–99)

## 2014-03-13 SURGERY — COLONOSCOPY
Anesthesia: Moderate Sedation

## 2014-03-13 MED ORDER — MIDAZOLAM HCL 5 MG/5ML IJ SOLN
INTRAMUSCULAR | Status: DC | PRN
  Administered 2014-03-13: 1 mg via INTRAVENOUS
  Administered 2014-03-13 (×2): 2 mg via INTRAVENOUS

## 2014-03-13 MED ORDER — MEPERIDINE HCL 100 MG/ML IJ SOLN
INTRAMUSCULAR | Status: AC
  Filled 2014-03-13: qty 2

## 2014-03-13 MED ORDER — MEPERIDINE HCL 100 MG/ML IJ SOLN
INTRAMUSCULAR | Status: DC | PRN
  Administered 2014-03-13 (×2): 50 mg via INTRAVENOUS

## 2014-03-13 MED ORDER — MIDAZOLAM HCL 5 MG/5ML IJ SOLN
INTRAMUSCULAR | Status: AC
  Filled 2014-03-13: qty 10

## 2014-03-13 MED ORDER — ONDANSETRON HCL 4 MG/2ML IJ SOLN
INTRAMUSCULAR | Status: DC | PRN
  Administered 2014-03-13: 4 mg via INTRAVENOUS

## 2014-03-13 MED ORDER — ONDANSETRON HCL 4 MG/2ML IJ SOLN
INTRAMUSCULAR | Status: AC
  Filled 2014-03-13: qty 2

## 2014-03-13 MED ORDER — STERILE WATER FOR IRRIGATION IR SOLN
Status: DC | PRN
  Administered 2014-03-13: 10:00:00

## 2014-03-13 MED ORDER — SODIUM CHLORIDE 0.9 % IV SOLN
INTRAVENOUS | Status: DC
  Administered 2014-03-13: 1000 mL via INTRAVENOUS

## 2014-03-13 NOTE — Interval H&P Note (Signed)
History and Physical Interval Note:  03/13/2014 10:16 AM  Jennifer [name]  has presented today for surgery, with the diagnosis of RECTAL BLEED  The various methods of treatment have been discussed with the patient and family. After consideration of risks, benefits and other options for treatment, the patient has consented to  Procedure(s) with comments: COLONOSCOPY (N/A) - 9:30 as a surgical intervention .  The patient's history has been reviewed, patient examined, no change in status, stable for surgery.  I have reviewed the patient's chart and labs.  Questions were answered to the patient's satisfaction.     Jennifer [name]  No change. Colonoscopy per plan.The risks, benefits, limitations, alternatives and imponderables have been reviewed with the patient. Questions have been answered. All parties are agreeable.

## 2014-03-13 NOTE — Op Note (Signed)
[facility] [address] [place], [address]   COLONOSCOPY PROCEDURE REPORT  PATIENT: Jennifer [name], Jennifer [name]  MR#:         [id number] BIRTHDATE: [birth year] , 37  yrs. old GENDER: Female ENDOSCOPIST: R.  Garfield Cornea, MD FACP FACG REFERRED BY:  Tula Nakayama, M.D. PROCEDURE DATE:  03/13/2014 PROCEDURE:     Ileocolonoscopy with biopsy  INDICATIONS: Paper hematochezia  INFORMED CONSENT:  The risks, benefits, alternatives and imponderables including but not limited to bleeding, perforation as well as the possibility of a missed lesion have been reviewed.  The potential for biopsy, lesion removal, etc. have also been discussed.  Questions have been answered.  All parties agreeable. Please see the history and physical in the medical record for more information.  MEDICATIONS: Versed 4 mg IV and Demerol 100 mg IV in divided doses. Zofran 4 mg IV  DESCRIPTION OF PROCEDURE:  After a digital rectal exam was performed, the EC-[id number] ([id number])  colonoscope was advanced from the anus through the rectum and colon to the area of the cecum, ileocecal valve and appendiceal orifice.  The cecum was deeply intubated.  These structures were well-seen and photographed for the record.  From the level of the cecum and ileocecal valve, the scope was slowly and cautiously withdrawn.  The mucosal surfaces were carefully surveyed utilizing scope tip deflection to facilitate fold flattening as needed.  The scope was pulled down into the rectum where a thorough examination including retroflexion was performed.    FINDINGS:  Adequate preparation. Grade 3 hemorrhoids; otherwise, normal rectum. Scattered left-sided and transverse diverticula; the remainder of the colonic mucosa appeared normal except for a single diminutive polyp in the base of the cecum. The distal 5 cm of terminal ileum mucosa also appeared normal.  THERAPEUTIC / DIAGNOSTIC MANEUVERS PERFORMED:  The above-mentioned polyp  was cold biopsied/removed  COMPLICATIONS: None  CECAL WITHDRAWAL TIME:  10 minutes  IMPRESSION:  Grade 3 hemorrhoids; colonic diverticulosis; single colonic polyp status post removal as described above  RECOMMENDATIONS: Followup on pathology. Patient will benefit from hemorrhoid banding. We'll schedule in about 2-3 weeks   _______________________________ eSigned:  R. Garfield Cornea, MD FACP [facility] 03/13/2014 10:55 AM   CC:

## 2014-03-13 NOTE — H&P (View-Only) (Signed)
Primary Care Physician:  Tula Nakayama, MD  Primary Gastroenterologist:  Garfield Cornea, MD   Chief Complaint  Patient presents with  . Rectal Bleeding  . Hemorrhoids    HPI:  Jennifer [name] is a 38 y.o. female here at the request of Dr. Moshe Cipro for further evaluation of rectal bleeding.  Saw Dr. Moshe Cipro. On exam felt to be due to hemorrhoids. On hemorrhoid foam for one week so far. Last bleeding was this morning. Alternating constipation and diarrhea. Usually okay if watches diet and walks. Denies any abdominal pain, nausea, vomiting, heartburn, dysphagia. She is on phentermine for weight loss purposes.  Current Outpatient Prescriptions  Medication Sig Dispense Refill  . hydrocortisone-pramoxine (PROCTOFOAM HC) rectal foam Place 1 applicator rectally 2 (two) times daily.  10 g  0  . metFORMIN (GLUCOPHAGE) 500 MG tablet TAKE 1 TABLET (500 MG TOTAL) BY MOUTH 3 (THREE) TIMES DAILY WITH MEALS.  90 tablet  2  . phentermine (ADIPEX-P) 37.5 MG tablet Half tablet once daily at breakfast  15 tablet  1  . venlafaxine XR (EFFEXOR-XR) 37.5 MG 24 hr capsule Take 1 capsule (37.5 mg total) by mouth daily with breakfast.  30 capsule  5   No current facility-administered medications for this visit.    Allergies as of 02/27/2014  . (No Known Allergies)    Past Medical History  Diagnosis Date  . Family history of diabetes mellitus   . History of recurrent UTIs   . Obesity   . Depression   . Alopecia   . Abnormal vaginal Pap smear     Past Surgical History  Procedure Laterality Date  . Appendectomy    . Knee arthroscopy      right    Family History  Problem Relation Age of Onset  . Adopted: Yes  . Hypertension Mother   . Diabetes Mother   . Other Mother     cholangiocarcinoma, age 41, deceased    History   Social History  . Marital Status: Single    Spouse Name: N/A    Number of Children: 4  . Years of Education: N/A   Occupational History  . UNEMPLOYWED SINCE 10/2010     Social History Main Topics  . Smoking status: Never Smoker   . Smokeless tobacco: Never Used  . Alcohol Use: No  . Drug Use: No  . Sexual Activity: Not Currently    Birth Control/ Protection: IUD, Condom   Other Topics Concern  . Not on file   Social History Narrative  . No narrative on file      ROS:  General: Negative for anorexia, unintentional weight loss, fever, chills, fatigue, weakness. Eyes: Negative for vision changes.  ENT: Negative for hoarseness, difficulty swallowing , nasal congestion. CV: Negative for chest pain, angina, palpitations, dyspnea on exertion, peripheral edema.  Respiratory: Negative for dyspnea at rest, dyspnea on exertion, cough, sputum, wheezing.  GI: See history of present illness. GU:  Negative for dysuria, hematuria, urinary incontinence, urinary frequency, nocturnal urination.  MS: Negative for joint pain, low back pain.  Derm: Negative for rash or itching.  Neuro: Negative for weakness, abnormal sensation, seizure, frequent headaches, memory loss, confusion.  Psych: Negative for anxiety, depression, suicidal ideation, hallucinations.  Endo: Negative for unusual weight change.  Heme: Negative for bruising or bleeding. Allergy: Negative for rash or hives.    Physical Examination:  BP 120/80  Pulse 75  Temp(Src) 98.4 F (36.9 C) (Oral)  Ht 5\' 7"  (1.702 m)  Wt 308  lb 6.4 oz (139.889 kg)  BMI 48.29 kg/m2  LMP 02/06/2014   General: Well-nourished, well-developed in no acute distress.  Head: Normocephalic, atraumatic.   Eyes: Conjunctiva pink, no icterus. Mouth: Oropharyngeal mucosa moist and pink , no lesions erythema or exudate. Neck: Supple without thyromegaly, masses, or lymphadenopathy.  Lungs: Clear to auscultation bilaterally.  Heart: Regular rate and rhythm, no murmurs rubs or gallops.  Abdomen: Bowel sounds are normal, nontender, nondistended, no hepatosplenomegaly or masses, no abdominal bruits or    hernia , no rebound or  guarding.   Rectal: Deferred Extremities: No lower extremity edema. No clubbing or deformities.  Neuro: Alert and oriented x 4 , grossly normal neurologically.  Skin: Warm and dry, no rash or jaundice.   Psych: Alert and cooperative, normal mood and affect.

## 2014-03-13 NOTE — Discharge Instructions (Addendum)
Colonoscopy Discharge Instructions  Read the instructions outlined below and refer to this sheet in the next few weeks. These discharge instructions provide you with general information on caring for yourself after you leave the hospital. Your doctor may also give you specific instructions. While your treatment has been planned according to the most current medical practices available, unavoidable complications occasionally occur. If you have any problems or questions after discharge, call Dr. Gala Romney at [phone]. ACTIVITY  You may resume your regular activity, but move at a slower pace for the next 24 hours.   Take frequent rest periods for the next 24 hours.   Walking will help get rid of the air and reduce the bloated feeling in your belly (abdomen).   No driving for 24 hours (because of the medicine (anesthesia) used during the test).    Do not sign any important legal documents or operate any machinery for 24 hours (because of the anesthesia used during the test).  NUTRITION  Drink plenty of fluids.   You may resume your normal diet as instructed by your doctor.   Begin with a light meal and progress to your normal diet. Heavy or fried foods are harder to digest and may make you feel sick to your stomach (nauseated).   Avoid alcoholic beverages for 24 hours or as instructed.  MEDICATIONS  You may resume your normal medications unless your doctor tells you otherwise.  WHAT YOU CAN EXPECT TODAY  Some feelings of bloating in the abdomen.   Passage of more gas than usual.   Spotting of blood in your stool or on the toilet paper.  IF YOU HAD POLYPS REMOVED DURING THE COLONOSCOPY:  No aspirin products for 7 days or as instructed.   No alcohol for 7 days or as instructed.   Eat a soft diet for the next 24 hours.  FINDING OUT THE RESULTS OF YOUR TEST Not all test results are available during your visit. If your test results are not back during the visit, make an appointment  with your caregiver to find out the results. Do not assume everything is normal if you have not heard from your caregiver or the medical facility. It is important for you to follow up on all of your test results.  SEEK IMMEDIATE MEDICAL ATTENTION IF:  You have more than a spotting of blood in your stool.   Your belly is swollen (abdominal distention).   You are nauseated or vomiting.   You have a temperature over 101.   You have abdominal pain or discomfort that is severe or gets worse throughout the day.    Hemorrhoid, diverticulosis and polyp information provided  Schedule office appointment with me for hemorrhoid banding about 2-3 weeks  Further recommendations pending review of pathology report Hemorrhoid Banding Hemorrhoids are veins in the anus and lower rectum that become enlarged. The most common symptoms are rectal bleeding, itching, and sometimes pain. Hemorrhoids might come out with straining or having a bowel movement, and they can sometimes be pushed back in. There are internal and external hemorrhoids. Only internal hemorrhoids can be treated with banding. In this procedure, a rubber band is placed near the hemorrhoid tissue, cutting off the blood supply. This procedure prevents the hemorrhoids from slipping down. LET YOUR CAREGIVER KNOW ABOUT: All medicines you are taking, especially blood thinners such as aspirin and coumadin.  RISKS AND COMPLICATIONS This is not a painful procedure, but if you do have intense pain immediately let your surgeon know because  the band may need to be removed. You may have some mild pain or discomfort in the first 2 days or so after treatment. Sometimes there may be delayed bleeding in the first week after treatment.  BEFORE THE PROCEDURE  There is no special preparation needed before banding. Your surgeon may have you do an enema prior to the procedure. You will go home the same day.  HOME CARE INSTRUCTIONS   Your surgeon might instruct you  to do sitz baths as needed if you have discomfort or after a bowel movement.  You may be instructed to use fiber supplements. SEEK MEDICAL CARE IF:  You have an increase in pain.  Your pain does not get better. SEEK IMMEDIATE MEDICAL CARE IF:  You have intense pain.  Fever greater than 100.5 F (38.1 C).  Bleeding that does not stop, or pus from the anus. Document Released: 08/29/2009 Document Revised: 01/24/2012 Document Reviewed: 08/29/2009 [facility] Patient Information 2014 [place], [place].  Hemorrhoids Hemorrhoids are swollen veins around the rectum or anus. There are two types of hemorrhoids:   Internal hemorrhoids. These occur in the veins just inside the rectum. They may poke through to the outside and become irritated and painful.  External hemorrhoids. These occur in the veins outside the anus and can be felt as a painful swelling or hard lump near the anus. CAUSES  Pregnancy.   Obesity.   Constipation or diarrhea.   Straining to have a bowel movement.   Sitting for long periods on the toilet.  Heavy lifting or other activity that caused you to strain.  Anal intercourse. SYMPTOMS   Pain.   Anal itching or irritation.   Rectal bleeding.   Fecal leakage.   Anal swelling.   One or more lumps around the anus.  DIAGNOSIS  Your caregiver may be able to diagnose hemorrhoids by visual examination. Other examinations or tests that may be performed include:   Examination of the rectal area with a gloved hand (digital rectal exam).   Examination of anal canal using a small tube (scope).   A blood test if you have lost a significant amount of blood.  A test to look inside the colon (sigmoidoscopy or colonoscopy). TREATMENT Most hemorrhoids can be treated at home. However, if symptoms do not seem to be getting better or if you have a lot of rectal bleeding, your caregiver may perform a procedure to help make the hemorrhoids get smaller or remove  them completely. Possible treatments include:   Placing a rubber band at the base of the hemorrhoid to cut off the circulation (rubber band ligation).   Injecting a chemical to shrink the hemorrhoid (sclerotherapy).   Using a tool to burn the hemorrhoid (infrared light therapy).   Surgically removing the hemorrhoid (hemorrhoidectomy).   Stapling the hemorrhoid to block blood flow to the tissue (hemorrhoid stapling).  HOME CARE INSTRUCTIONS   Eat foods with fiber, such as whole grains, beans, nuts, fruits, and vegetables. Ask your doctor about taking products with added fiber in them (fibersupplements).  Increase fluid intake. Drink enough water and fluids to keep your urine clear or pale yellow.   Exercise regularly.   Go to the bathroom when you have the urge to have a bowel movement. Do not wait.   Avoid straining to have bowel movements.   Keep the anal area dry and clean. Use wet toilet paper or moist towelettes after a bowel movement.   Medicated creams and suppositories may be used or applied  as directed.   Only take over-the-counter or prescription medicines as directed by your caregiver.   Take warm sitz baths for 15 20 minutes, 3 4 times a day to ease pain and discomfort.   Place ice packs on the hemorrhoids if they are tender and swollen. Using ice packs between sitz baths may be helpful.   Put ice in a plastic bag.   Place a towel between your skin and the bag.   Leave the ice on for 15 20 minutes, 3 4 times a day.   Do not use a donut-shaped pillow or sit on the toilet for long periods. This increases blood pooling and pain.  SEEK MEDICAL CARE IF:  You have increasing pain and swelling that is not controlled by treatment or medicine.  You have uncontrolled bleeding.  You have difficulty or you are unable to have a bowel movement.  You have pain or inflammation outside the area of the hemorrhoids. MAKE SURE YOU:  Understand these  instructions.  Will watch your condition.  Will get help right away if you are not doing well or get worse. Document Released: 10/29/2000 Document Revised: 10/18/2012 Document Reviewed: 09/05/2012 [facility] Patient Information 2014 [place]. Diverticulosis Diverticulosis is a common condition that develops when small pouches (diverticula) form in the wall of the colon. The risk of diverticulosis increases with age. It happens more often in people who eat a low-fiber diet. Most individuals with diverticulosis have no symptoms. Those individuals with symptoms usually experience abdominal pain, constipation, or loose stools (diarrhea). HOME CARE INSTRUCTIONS   Increase the amount of fiber in your diet as directed by your caregiver or dietician. This may reduce symptoms of diverticulosis.  Your caregiver may recommend taking a dietary fiber supplement.  Drink at least 6 to 8 glasses of water each day to prevent constipation.  Try not to strain when you have a bowel movement.  Your caregiver may recommend avoiding nuts and seeds to prevent complications, although this is still an uncertain benefit.  Only take over-the-counter or prescription medicines for pain, discomfort, or fever as directed by your caregiver. FOODS WITH HIGH FIBER CONTENT INCLUDE:  Fruits. Apple, peach, pear, tangerine, raisins, prunes.  Vegetables. Brussels sprouts, asparagus, broccoli, cabbage, carrot, cauliflower, romaine lettuce, spinach, summer squash, tomato, winter squash, zucchini.  Starchy Vegetables. Baked beans, kidney beans, lima beans, split peas, lentils, potatoes (with skin).  Grains. Whole wheat bread, brown rice, bran flake cereal, plain oatmeal, white rice, shredded wheat, bran muffins. SEEK IMMEDIATE MEDICAL CARE IF:   You develop increasing pain or severe bloating.  You have an oral temperature above 102 F (38.9 C), not controlled by medicine.  You develop vomiting or bowel movements  that are bloody or black. Document Released: 07/29/2004 Document Revised: 01/24/2012 Document Reviewed: 04/01/2010 [facility] Patient Information 2014 [place]. Colon Polyps Polyps are lumps of extra tissue growing inside the body. Polyps can grow in the large intestine (colon). Most colon polyps are noncancerous (benign). However, some colon polyps can become cancerous over time. Polyps that are larger than a pea may be harmful. To be safe, caregivers remove and test all polyps. CAUSES  Polyps form when mutations in the genes cause your cells to grow and divide even though no more tissue is needed. RISK FACTORS There are a number of risk factors that can increase your chances of getting colon polyps. They include:  Being older than 50 years.  Family history of colon polyps or colon cancer.  Long-term colon diseases,  such as colitis or Crohn disease.  Being overweight.  Smoking.  Being inactive.  Drinking too much alcohol. SYMPTOMS  Most small polyps do not cause symptoms. If symptoms are present, they may include:  Blood in the stool. The stool may look dark red or black.  Constipation or diarrhea that lasts longer than 1 week. DIAGNOSIS People often do not know they have polyps until their caregiver finds them during a regular checkup. Your caregiver can use 4 tests to check for polyps:  Digital rectal exam. The caregiver wears gloves and feels inside the rectum. This test would find polyps only in the rectum.  Barium enema. The caregiver puts a liquid called barium into your rectum before taking X-rays of your colon. Barium makes your colon look white. Polyps are dark, so they are easy to see in the X-ray pictures.  Sigmoidoscopy. A thin, flexible tube (sigmoidoscope) is placed into your rectum. The sigmoidoscope has a light and tiny camera in it. The caregiver uses the sigmoidoscope to look at the last third of your colon.  Colonoscopy. This test is like sigmoidoscopy,  but the caregiver looks at the entire colon. This is the most common method for finding and removing polyps. TREATMENT  Any polyps will be removed during a sigmoidoscopy or colonoscopy. The polyps are then tested for cancer. PREVENTION  To help lower your risk of getting more colon polyps:  Eat plenty of fruits and vegetables. Avoid eating fatty foods.  Do not smoke.  Avoid drinking alcohol.  Exercise every day.  Lose weight if recommended by your caregiver.  Eat plenty of calcium and folate. Foods that are rich in calcium include milk, cheese, and broccoli. Foods that are rich in folate include chickpeas, kidney beans, and spinach. HOME CARE INSTRUCTIONS Keep all follow-up appointments as directed by your caregiver. You may need periodic exams to check for polyps. SEEK MEDICAL CARE IF: You notice bleeding during a bowel movement. Document Released: 07/28/2004 Document Revised: 01/24/2012 Document Reviewed: 01/11/2012 [facility] Patient Information 2014 [place]. High-Fiber Diet Fiber is found in fruits, vegetables, and grains. A high-fiber diet encourages the addition of more whole grains, legumes, fruits, and vegetables in your diet. The recommended amount of fiber for adult males is 38 g per day. For adult females, it is 25 g per day. Pregnant and lactating women should get 28 g of fiber per day. If you have a digestive or bowel problem, ask your caregiver for advice before adding high-fiber foods to your diet. Eat a variety of high-fiber foods instead of only a select few type of foods.  PURPOSE  To increase stool bulk.  To make bowel movements more regular to prevent constipation.  To lower cholesterol.  To prevent overeating. WHEN IS THIS DIET USED?  It may be used if you have constipation and hemorrhoids.  It may be used if you have uncomplicated diverticulosis (intestine condition) and irritable bowel syndrome.  It may be used if you need help with weight  management.  It may be used if you want to add it to your diet as a protective measure against atherosclerosis, diabetes, and cancer. SOURCES OF FIBER  Whole-grain breads and cereals.  Fruits, such as apples, oranges, bananas, berries, prunes, and pears.  Vegetables, such as green peas, carrots, sweet potatoes, beets, broccoli, cabbage, spinach, and artichokes.  Legumes, such split peas, soy, lentils.  Almonds. FIBER CONTENT IN FOODS Starches and Grains / Dietary Fiber (g)  Cheerios, 1 cup / 3 g  Corn  Flakes cereal, 1 cup / 0.7 g  Rice crispy treat cereal, 1 cup / 0.3 g  Instant oatmeal (cooked),  cup / 2 g  Frosted wheat cereal, 1 cup / 5.1 g  Brown, long-grain rice (cooked), 1 cup / 3.5 g  White, long-grain rice (cooked), 1 cup / 0.6 g  Enriched macaroni (cooked), 1 cup / 2.5 g Legumes / Dietary Fiber (g)  Baked beans (canned, plain, or vegetarian),  cup / 5.2 g  Kidney beans (canned),  cup / 6.8 g  Pinto beans (cooked),  cup / 5.5 g Breads and Crackers / Dietary Fiber (g)  Plain or honey graham crackers, 2 squares / 0.7 g  Saltine crackers, 3 squares / 0.3 g  Plain, salted pretzels, 10 pieces / 1.8 g  Whole-wheat bread, 1 slice / 1.9 g  White bread, 1 slice / 0.7 g  Raisin bread, 1 slice / 1.2 g  Plain bagel, 3 oz / 2 g  Flour tortilla, 1 oz / 0.9 g  Corn tortilla, 1 small / 1.5 g  Hamburger or hotdog bun, 1 small / 0.9 g Fruits / Dietary Fiber (g)  Apple with skin, 1 medium / 4.4 g  Sweetened applesauce,  cup / 1.5 g  Banana,  medium / 1.5 g  Grapes, 10 grapes / 0.4 g  Orange, 1 small / 2.3 g  Raisin, 1.5 oz / 1.6 g  Melon, 1 cup / 1.4 g Vegetables / Dietary Fiber (g)  Green beans (canned),  cup / 1.3 g  Carrots (cooked),  cup / 2.3 g  Broccoli (cooked),  cup / 2.8 g  Peas (cooked),  cup / 4.4 g  Mashed potatoes,  cup / 1.6 g  Lettuce, 1 cup / 0.5 g  Corn (canned),  cup / 1.6 g  Tomato,  cup / 1.1 g Document  Released: 11/01/2005 Document Revised: 05/02/2012 Document Reviewed: 02/03/2012 [facility] Patient Information 2014 [place].

## 2014-03-15 ENCOUNTER — Encounter (HOSPITAL_COMMUNITY): Payer: Self-pay | Admitting: Internal Medicine

## 2014-03-17 ENCOUNTER — Encounter: Payer: Self-pay | Admitting: Internal Medicine

## 2014-03-26 ENCOUNTER — Encounter: Payer: BC Managed Care – PPO | Admitting: Internal Medicine

## 2014-03-26 ENCOUNTER — Telehealth: Payer: Self-pay | Admitting: Internal Medicine

## 2014-03-26 NOTE — Telephone Encounter (Signed)
Pt was a no show

## 2014-04-03 ENCOUNTER — Other Ambulatory Visit: Payer: Self-pay

## 2014-04-03 MED ORDER — HYDROCORTISONE 2.5 % RE CREA: 1.0000 "application " | TOPICAL_CREAM | Freq: Two times a day (BID) | RECTAL | Status: DC

## 2014-05-01 ENCOUNTER — Other Ambulatory Visit: Payer: Self-pay | Admitting: Family Medicine

## 2014-05-06 ENCOUNTER — Other Ambulatory Visit: Payer: Self-pay | Admitting: Family Medicine

## 2014-05-16 ENCOUNTER — Ambulatory Visit: Payer: BC Managed Care – PPO | Admitting: Family Medicine

## 2014-05-26 ENCOUNTER — Other Ambulatory Visit: Payer: Self-pay | Admitting: Family Medicine

## 2014-06-06 ENCOUNTER — Telehealth: Payer: Self-pay | Admitting: Family Medicine

## 2014-06-06 NOTE — Telephone Encounter (Signed)
Lab order faxed to requested #

## 2014-06-25 ENCOUNTER — Ambulatory Visit: Payer: BC Managed Care – PPO | Admitting: Family Medicine

## 2014-07-04 ENCOUNTER — Other Ambulatory Visit: Payer: Self-pay | Admitting: Family Medicine

## 2014-07-10 ENCOUNTER — Encounter: Payer: Self-pay | Admitting: Family Medicine

## 2014-07-10 ENCOUNTER — Ambulatory Visit (INDEPENDENT_AMBULATORY_CARE_PROVIDER_SITE_OTHER): Payer: BC Managed Care – PPO | Admitting: Family Medicine

## 2014-07-10 VITALS — BP 120/82 | HR 80 | Resp 16 | Ht 67.0 in | Wt 306.0 lb

## 2014-07-10 DIAGNOSIS — L0501 Pilonidal cyst with abscess: Secondary | ICD-10-CM

## 2014-07-10 DIAGNOSIS — F5105 Insomnia due to other mental disorder: Secondary | ICD-10-CM | POA: Insufficient documentation

## 2014-07-10 DIAGNOSIS — F3289 Other specified depressive episodes: Secondary | ICD-10-CM

## 2014-07-10 DIAGNOSIS — F410 Panic disorder [episodic paroxysmal anxiety] without agoraphobia: Secondary | ICD-10-CM | POA: Insufficient documentation

## 2014-07-10 DIAGNOSIS — Z1239 Encounter for other screening for malignant neoplasm of breast: Secondary | ICD-10-CM

## 2014-07-10 DIAGNOSIS — R7303 Prediabetes: Secondary | ICD-10-CM

## 2014-07-10 DIAGNOSIS — F329 Major depressive disorder, single episode, unspecified: Secondary | ICD-10-CM

## 2014-07-10 DIAGNOSIS — F99 Mental disorder, not otherwise specified: Secondary | ICD-10-CM

## 2014-07-10 DIAGNOSIS — Z23 Encounter for immunization: Secondary | ICD-10-CM

## 2014-07-10 DIAGNOSIS — R7309 Other abnormal glucose: Secondary | ICD-10-CM

## 2014-07-10 DIAGNOSIS — F411 Generalized anxiety disorder: Secondary | ICD-10-CM

## 2014-07-10 DIAGNOSIS — N760 Acute vaginitis: Secondary | ICD-10-CM

## 2014-07-10 DIAGNOSIS — G479 Sleep disorder, unspecified: Secondary | ICD-10-CM

## 2014-07-10 LAB — BASIC METABOLIC PANEL
BUN: 9 mg/dL (ref 6–23)
CALCIUM: 8.9 mg/dL (ref 8.4–10.5)
CO2: 27 mEq/L (ref 19–32)
CREATININE: 0.73 mg/dL (ref 0.50–1.10)
Chloride: 102 mEq/L (ref 96–112)
GLUCOSE: 88 mg/dL (ref 70–99)
Potassium: 4.5 mEq/L (ref 3.5–5.3)
Sodium: 138 mEq/L (ref 135–145)

## 2014-07-10 LAB — CBC WITH DIFFERENTIAL/PLATELET
Basophils Absolute: 0 10*3/uL (ref 0.0–0.1)
Basophils Relative: 0 % (ref 0–1)
Eosinophils Absolute: 0.3 10*3/uL (ref 0.0–0.7)
Eosinophils Relative: 5 % (ref 0–5)
HEMATOCRIT: 38.4 % (ref 36.0–46.0)
Hemoglobin: 13.1 g/dL (ref 12.0–15.0)
LYMPHS PCT: 43 % (ref 12–46)
Lymphs Abs: 2.8 10*3/uL (ref 0.7–4.0)
MCH: 29.6 pg (ref 26.0–34.0)
MCHC: 34.1 g/dL (ref 30.0–36.0)
MCV: 86.9 fL (ref 78.0–100.0)
MONO ABS: 0.4 10*3/uL (ref 0.1–1.0)
Monocytes Relative: 6 % (ref 3–12)
Neutro Abs: 2.9 10*3/uL (ref 1.7–7.7)
Neutrophils Relative %: 46 % (ref 43–77)
Platelets: 375 10*3/uL (ref 150–400)
RBC: 4.42 MIL/uL (ref 3.87–5.11)
RDW: 14 % (ref 11.5–15.5)
WBC: 6.4 10*3/uL (ref 4.0–10.5)

## 2014-07-10 LAB — LIPID PANEL
Cholesterol: 140 mg/dL (ref 0–200)
HDL: 54 mg/dL (ref >39)
LDL Cholesterol: 77 mg/dL (ref 0–99)
Total CHOL/HDL Ratio: 2.6 Ratio
Triglycerides: 46 mg/dL (ref <150)
VLDL: 9 mg/dL (ref 0–40)

## 2014-07-10 LAB — HEMOGLOBIN A1C
Hgb A1c MFr Bld: 6.4 % — ABNORMAL HIGH (ref <5.7)
Mean Plasma Glucose: 137 mg/dL — ABNORMAL HIGH (ref <117)

## 2014-07-10 MED ORDER — BUSPIRONE HCL 5 MG PO TABS: 5.0000 mg | ORAL_TABLET | Freq: Three times a day (TID) | ORAL | Status: DC

## 2014-07-10 MED ORDER — FLUCONAZOLE 150 MG PO TABS: ORAL_TABLET | ORAL | Status: DC

## 2014-07-10 MED ORDER — METRONIDAZOLE 500 MG PO TABS: 500.0000 mg | ORAL_TABLET | Freq: Two times a day (BID) | ORAL | Status: DC

## 2014-07-10 MED ORDER — PHENTERMINE HCL 37.5 MG PO TABS: ORAL_TABLET | ORAL | Status: DC

## 2014-07-10 NOTE — Patient Instructions (Signed)
F/u in December, call if you need me before  New for anxiety is buspar one 3 times daily, 7am, 3pm and 11pm   You are referred to Dr Arnoldo Morale and to Dr Merlene Laughter and for a mammogram  It is important that you exercise regularly at least 30 minutes 5 times a week. If you develop chest pain, have severe difficulty breathing, or feel very tired, stop exercising immediately and seek medical attention    A healthy diet is rich in fruit, vegetables and whole grains. Poultry fish, nuts and beans are a healthy choice for protein rather then red meat. A low sodium diet and drinking 64 ounces of water daily is generally recommended. Oils and sweet should be limited. Carbohydrates especially for those who are diabetic or overweight, should be limited to 60-45 gram per meal. It is important to eat on a regular schedule, at least 3 times daily. Snacks should be primarily fruits, vegetables or nuts.  Weight loss goal of 12 pounds   Continue phentermine half daily  Antibiotic sent for vaginal infection and will also address cyst

## 2014-07-10 NOTE — Assessment & Plan Note (Addendum)
3 day h/o purulent drainage, flagyll prescribed and pt referred to gen surg

## 2014-07-11 ENCOUNTER — Other Ambulatory Visit: Payer: Self-pay | Admitting: Family Medicine

## 2014-07-11 ENCOUNTER — Other Ambulatory Visit: Payer: Self-pay

## 2014-07-11 DIAGNOSIS — Z1231 Encounter for screening mammogram for malignant neoplasm of breast: Secondary | ICD-10-CM

## 2014-07-11 MED ORDER — METFORMIN HCL 500 MG PO TABS: 500.0000 mg | ORAL_TABLET | Freq: Three times a day (TID) | ORAL | Status: DC

## 2014-07-14 DIAGNOSIS — Z23 Encounter for immunization: Secondary | ICD-10-CM | POA: Insufficient documentation

## 2014-07-14 NOTE — Assessment & Plan Note (Signed)
Improved oin current medication continue same

## 2014-07-14 NOTE — Assessment & Plan Note (Signed)
Excessive daytime sleepiness and chronic faytigue with h/o snoring excessively need sleep eval, she agrees to go

## 2014-07-14 NOTE — Assessment & Plan Note (Signed)
Uncontrolled , start buspar. Encouraged to develop and practice techniques to reduce stress

## 2014-07-14 NOTE — Assessment & Plan Note (Signed)
Vaccine administered at visit.  

## 2014-07-15 DIAGNOSIS — N760 Acute vaginitis: Secondary | ICD-10-CM | POA: Insufficient documentation

## 2014-07-15 NOTE — Assessment & Plan Note (Signed)
H/o recurrent BV and recent increase in fishy vag d/c noted. Will treat presumptively for Bv, pt will return if needed, for specimen collection and specific testing

## 2014-07-15 NOTE — Progress Notes (Signed)
Subjective:    Patient ID: Jennifer [name], female    DOB: 24-Nov-1975, 38 y.o.   MRN: [id number]  HPI The PT is here for follow up and re-evaluation of chronic medical conditions, medication management and review of any available recent lab and radiology data.  Preventive health is updated, specifically  Cancer screening and Immunization.   Questions or concerns regarding consultations or procedures which the PT has had in the interim are  Addressed.Has been treated by gyne for abn pap since last here The PT denies any adverse reactions to current medications since the last visit.  1 week h/o increased vaginal d/c with fishy odor, wants to be treated for presumed BV which she hs on a recurrent basis     Review of Systems See HPI Denies recent fever or chills. Denies sinus pressure, nasal congestion, ear pain or sore throat. Denies chest congestion, productive cough or wheezing. Denies chest pains, palpitations and leg swelling Denies abdominal pain, nausea, vomiting,diarrhea or constipation.   Denies dysuria, frequency, hesitancy or incontinence. Denies joint pain, swelling and limitation in mobility. Denies headaches, seizures, numbness, or tingling. Denies uncontrolled depression, c/o uncontrolled  anxiety with mild insomnia. C/o perianal cyst tender and with drainage recurrent with recent flare       Objective:   Physical Exam BP 120/82  Pulse 80  Resp 16  Ht 5\' 7"  (1.702 m)  Wt 306 lb (138.801 kg)  BMI 47.92 kg/m2  SpO2 97% Patient alert and oriented and in no cardiopulmonary distress.Morbidly obese  HEENT: No facial asymmetry, EOMI,   oropharynx pink and moist.  Neck supple no JVD, no mass.  Chest: Clear to auscultation bilaterally.  CVS: S1, S2 no murmurs, no S3.Regular rate.  ABD: Soft non tender.   Ext: No edema  MS: Adequate ROM spine, shoulders, hips and knees.  Skin: Infected cyst in right perianal area on buttock,  Mildly tender, unable ot extrude  drainage with direct pressure  Psych: Good eye contact, normal affect. Memory intact  milldy anxious not  depressed appearing.  CNS: CN 2-12 intact, power,  normal throughout.no focal deficits noted.        Assessment & Plan:  Pilonidal cyst with abscess 3 day h/o purulent drainage, flagyll prescribed and pt referred to gen surg  GAD (generalized anxiety disorder) Uncontrolled , start buspar. Encouraged to develop and practice techniques to reduce stress   Sleep disorder Excessive daytime sleepiness and chronic faytigue with h/o snoring excessively need sleep eval, she agrees to go  DEPRESSION Improved oin current medication continue same  Need for prophylactic vaccination and inoculation against influenza Vaccine administered at visit.   Prediabetes Deteriorated Dose inc in metformin had been non compliant and was under dosing Patient educated about the importance of limiting  Carbohydrate intake , the need to commit to daily physical activity for a minimum of 30 minutes , and to commit weight loss. The fact that changes in all these areas will reduce or eliminate all together the development of diabetes is stressed.   Updated lab needed at/ before next visit.     Morbid obesity Improved. Pt applauded on succesful weight loss through lifestyle change, and encouraged to continue same. Weight loss goal set for the next several months. Continue half phentermine daily, commit to change in diet and regular exercise  Vaginitis and vulvovaginitis, unspecified H/o recurrent BV and recent increase in fishy vag d/c noted. Will treat presumptively for Bv, pt will return if needed, for specimen collection and  specific testing

## 2014-07-15 NOTE — Assessment & Plan Note (Signed)
Improved. Pt applauded on succesful weight loss through lifestyle change, and encouraged to continue same. Weight loss goal set for the next several months. Continue half phentermine daily, commit to change in diet and regular exercise

## 2014-07-15 NOTE — Assessment & Plan Note (Signed)
Deteriorated Dose inc in metformin had been non compliant and was under dosing Patient educated about the importance of limiting  Carbohydrate intake , the need to commit to daily physical activity for a minimum of 30 minutes , and to commit weight loss. The fact that changes in all these areas will reduce or eliminate all together the development of diabetes is stressed.   Updated lab needed at/ before next visit.

## 2014-07-17 ENCOUNTER — Ambulatory Visit (HOSPITAL_COMMUNITY)
Admission: RE | Admit: 2014-07-17 | Discharge: 2014-07-17 | Disposition: A | Discharge status: Home / Self Care | Payer: BC Managed Care – PPO | Source: Ambulatory Visit | Attending: Family Medicine | Admitting: Family Medicine

## 2014-07-17 DIAGNOSIS — R928 Other abnormal and inconclusive findings on diagnostic imaging of breast: Secondary | ICD-10-CM | POA: Diagnosis not present

## 2014-07-17 DIAGNOSIS — Z1231 Encounter for screening mammogram for malignant neoplasm of breast: Secondary | ICD-10-CM | POA: Diagnosis not present

## 2014-07-17 IMAGING — MG MM DIGITAL SCREENING
4 series · 4 of 4 positions shown · non-contrast
Comparison: Previous Exam(s)

ACR Breast Density Category a: The breast tissue is almost entirely
fatty.

CLINICAL DATA: Screening.

EXAM:
DIGITAL SCREENING BILATERAL MAMMOGRAM WITH CAD

[L CC]
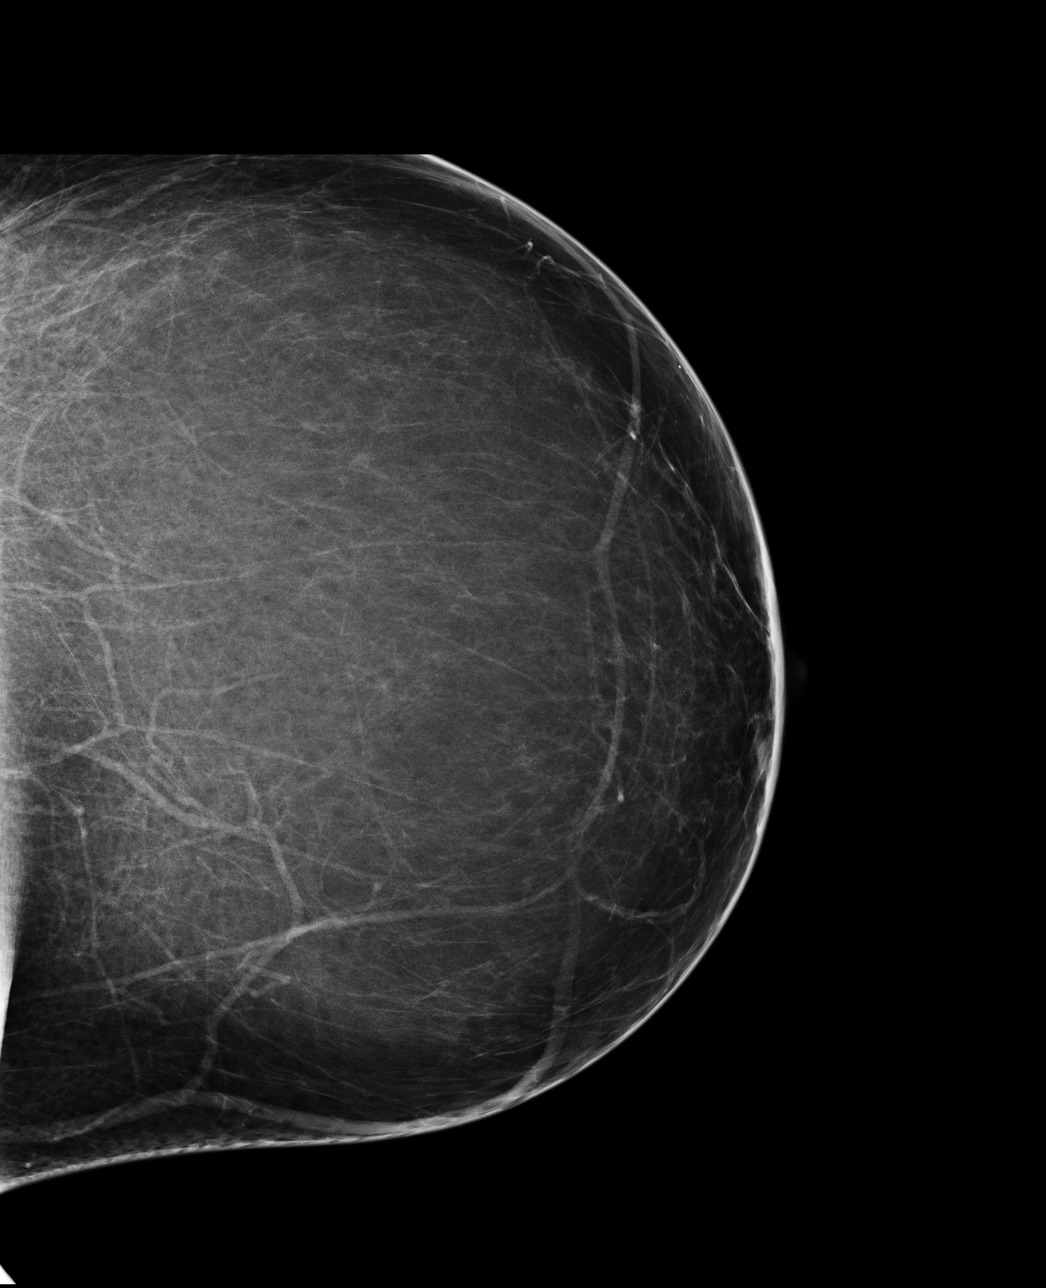

[L MLO]
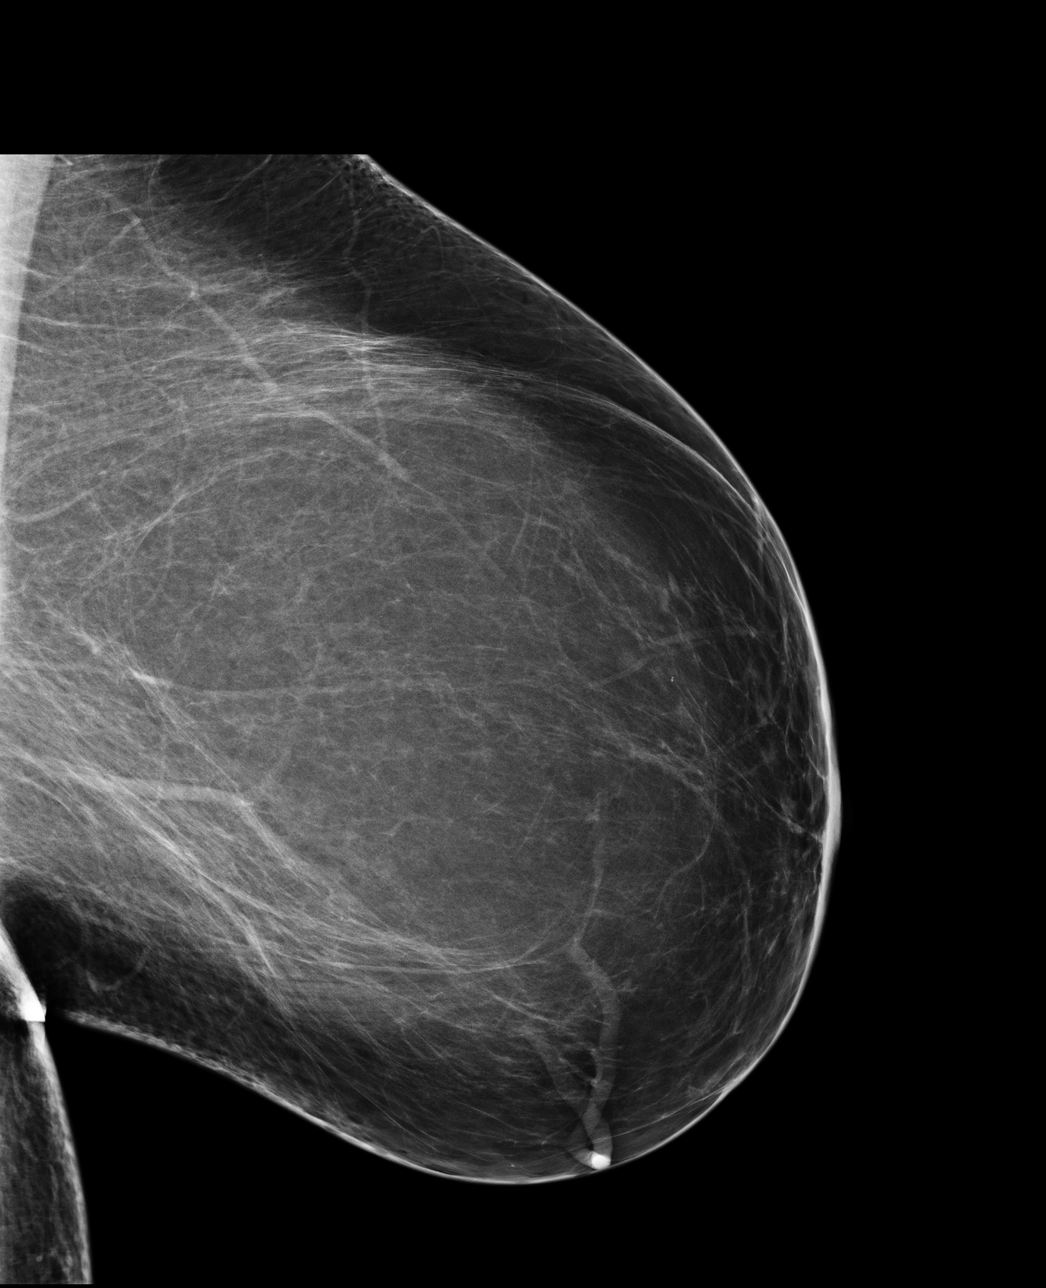

[R CC]
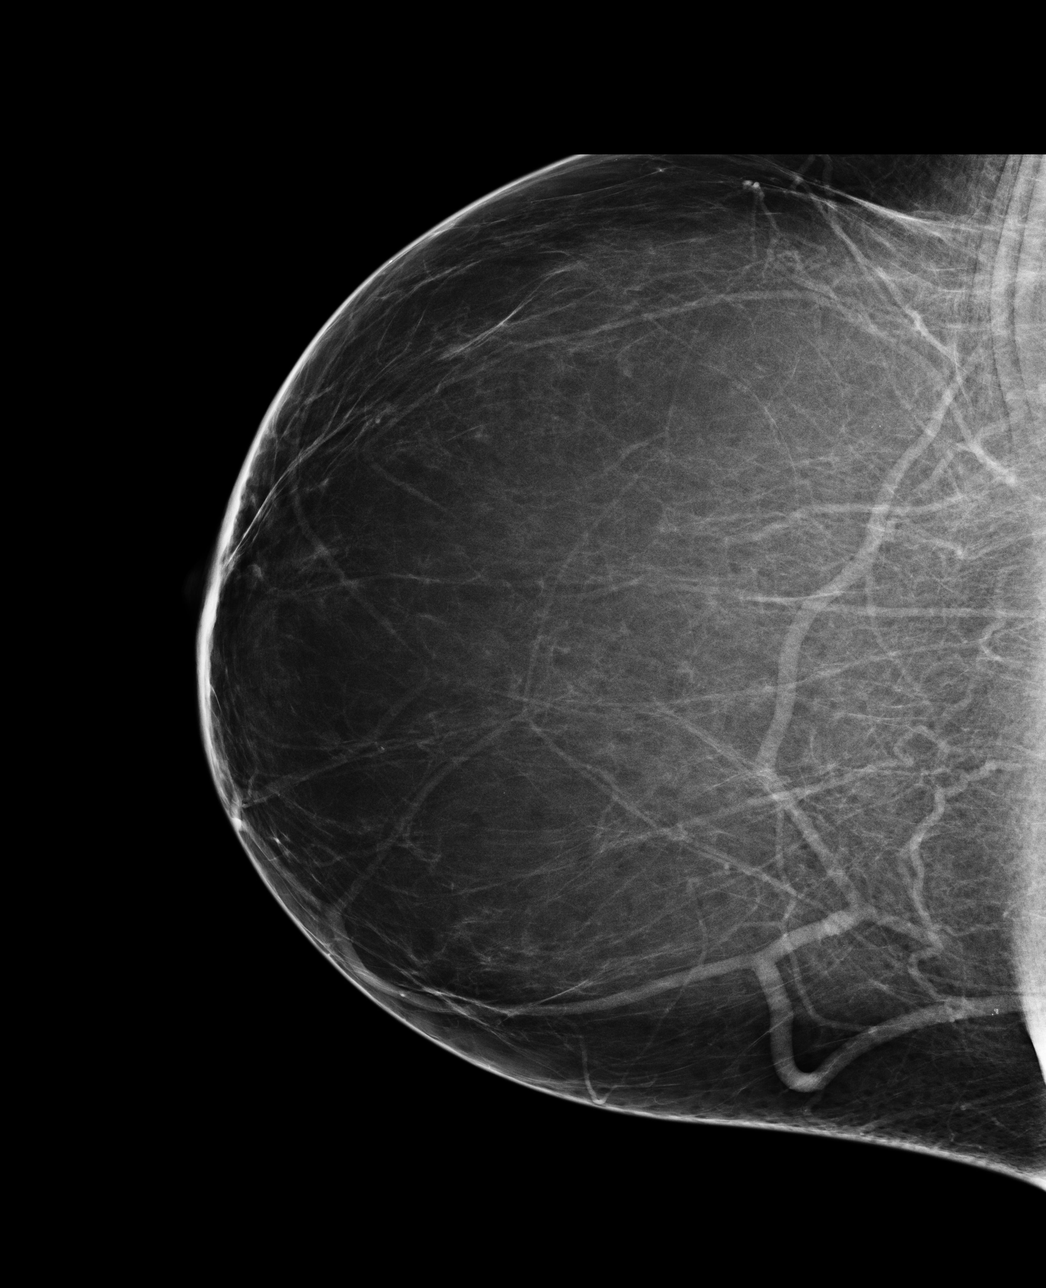

[R MLO]
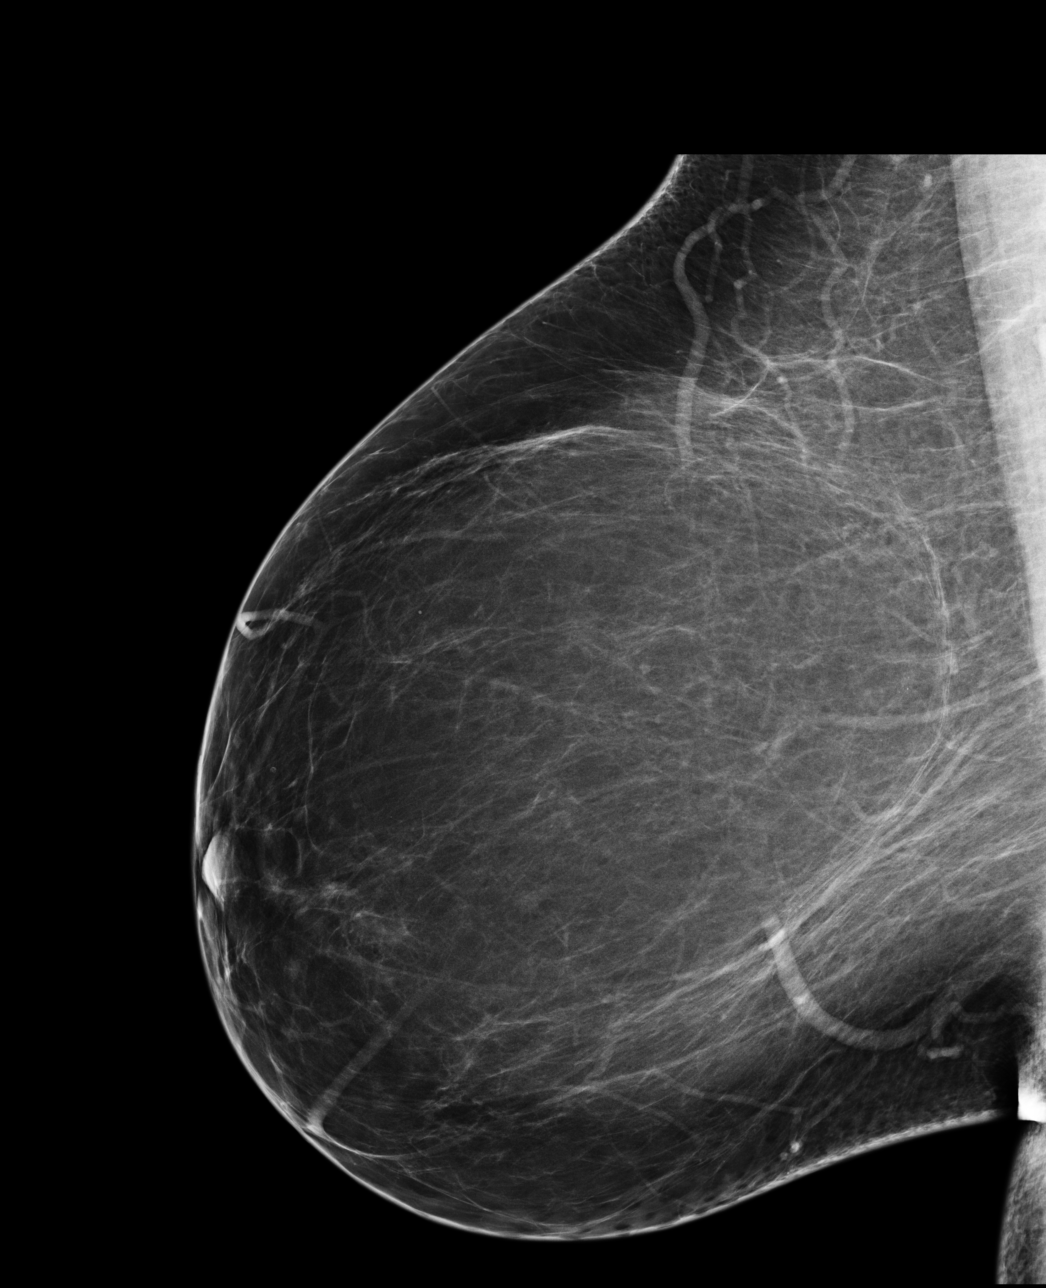

[4 of 4 positions shown; findings below may reference images not displayed]

FINDINGS: In the right breast, calcifications warrant further evaluation with
magnified views. In the left breast, no findings suspicious for
malignancy. Images were processed with CAD.
IMPRESSION: Further evaluation is suggested for calcifications in the right
breast.

RECOMMENDATION:
Diagnostic mammogram of the right breast. (Code:[E6])

The patient will be contacted regarding the findings, and additional
imaging will be scheduled.

BI-RADS CATEGORY  0: Incomplete. Need additional imaging evaluation
and/or prior mammograms for comparison.

## 2014-07-19 ENCOUNTER — Other Ambulatory Visit: Payer: Self-pay | Admitting: Family Medicine

## 2014-07-19 DIAGNOSIS — R928 Other abnormal and inconclusive findings on diagnostic imaging of breast: Secondary | ICD-10-CM

## 2014-07-24 ENCOUNTER — Encounter: Payer: Self-pay | Admitting: Family Medicine

## 2014-07-30 ENCOUNTER — Ambulatory Visit (HOSPITAL_COMMUNITY)
Admission: RE | Admit: 2014-07-30 | Discharge: 2014-07-30 | Disposition: A | Discharge status: Home / Self Care | Payer: BC Managed Care – PPO | Source: Ambulatory Visit | Attending: Family Medicine | Admitting: Family Medicine

## 2014-07-30 ENCOUNTER — Other Ambulatory Visit: Payer: Self-pay | Admitting: Family Medicine

## 2014-07-30 DIAGNOSIS — R928 Other abnormal and inconclusive findings on diagnostic imaging of breast: Secondary | ICD-10-CM

## 2014-07-30 IMAGING — MG MM DIGITAL DIAGNOSTIC UNILAT R
2 series · 2 of 2 positions shown · non-contrast
Comparison: Previous examinations, including the screening
mammogram dated [DATE].

ACR Breast Density Category a: The breast tissue is almost entirely
fatty.

CLINICAL DATA: Right breast calcifications at recent screening
mammography.

EXAM:
DIGITAL DIAGNOSTIC  RIGHT MAMMOGRAM

[R CC]
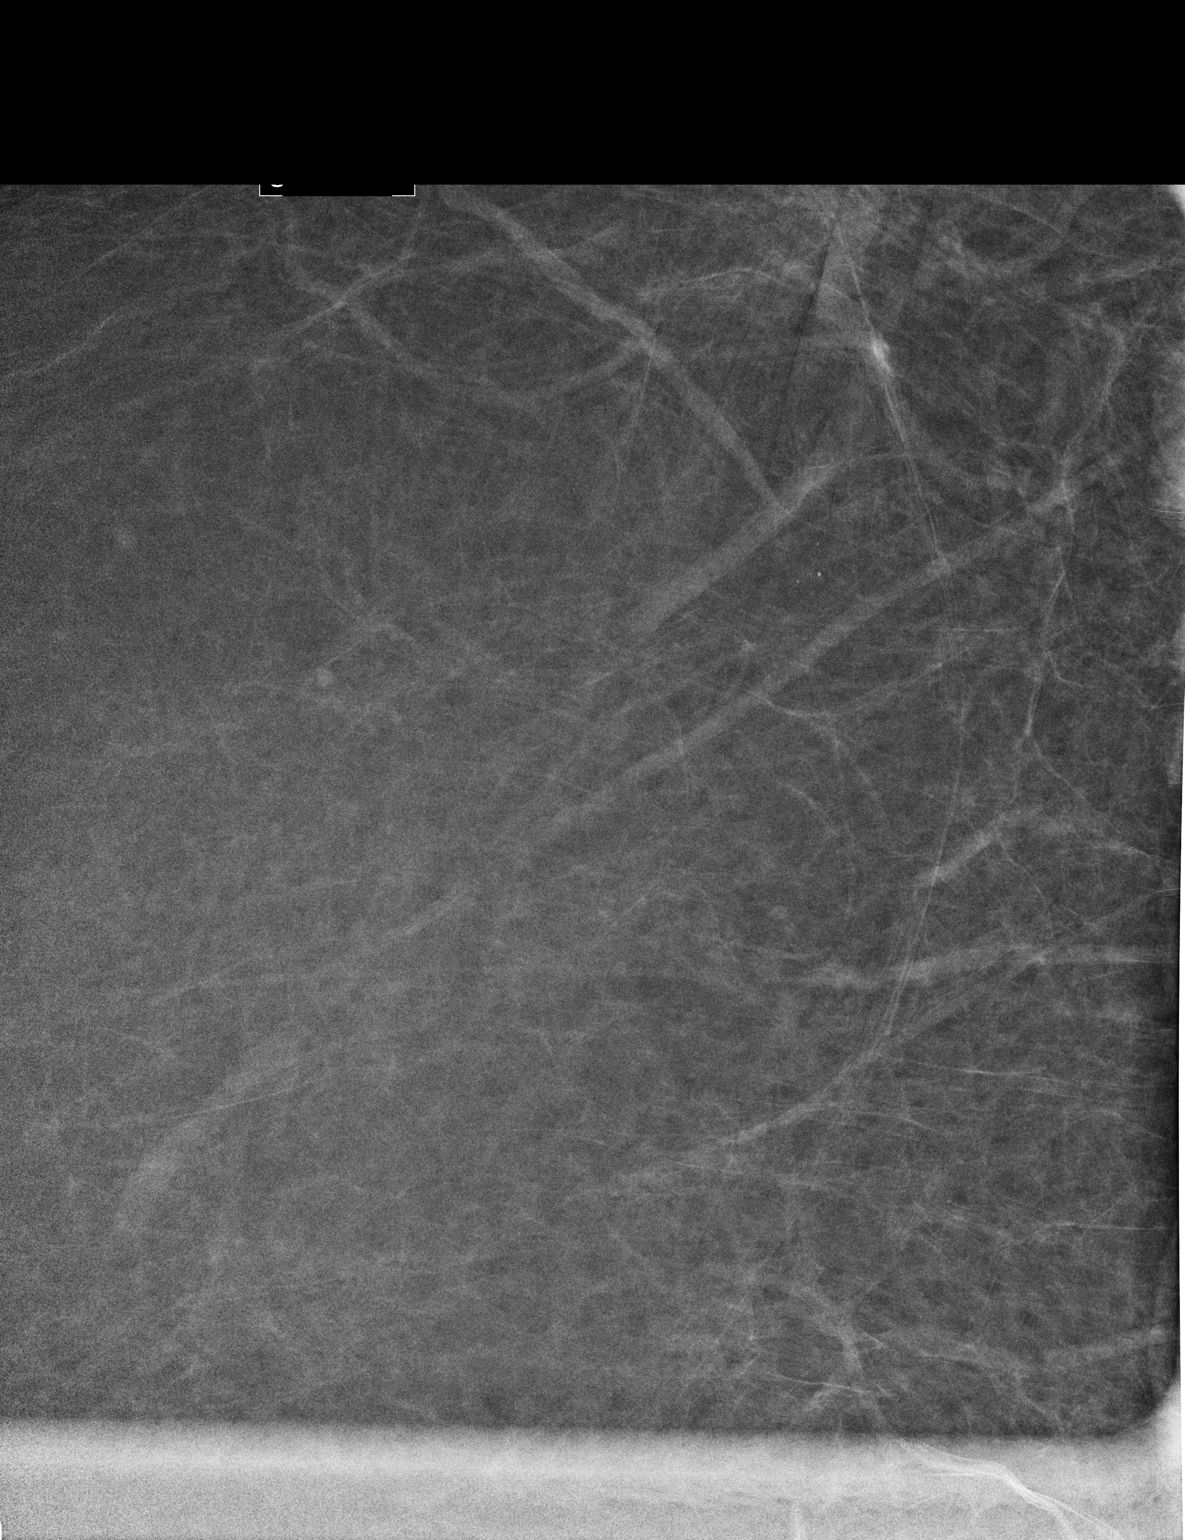

[R ML]
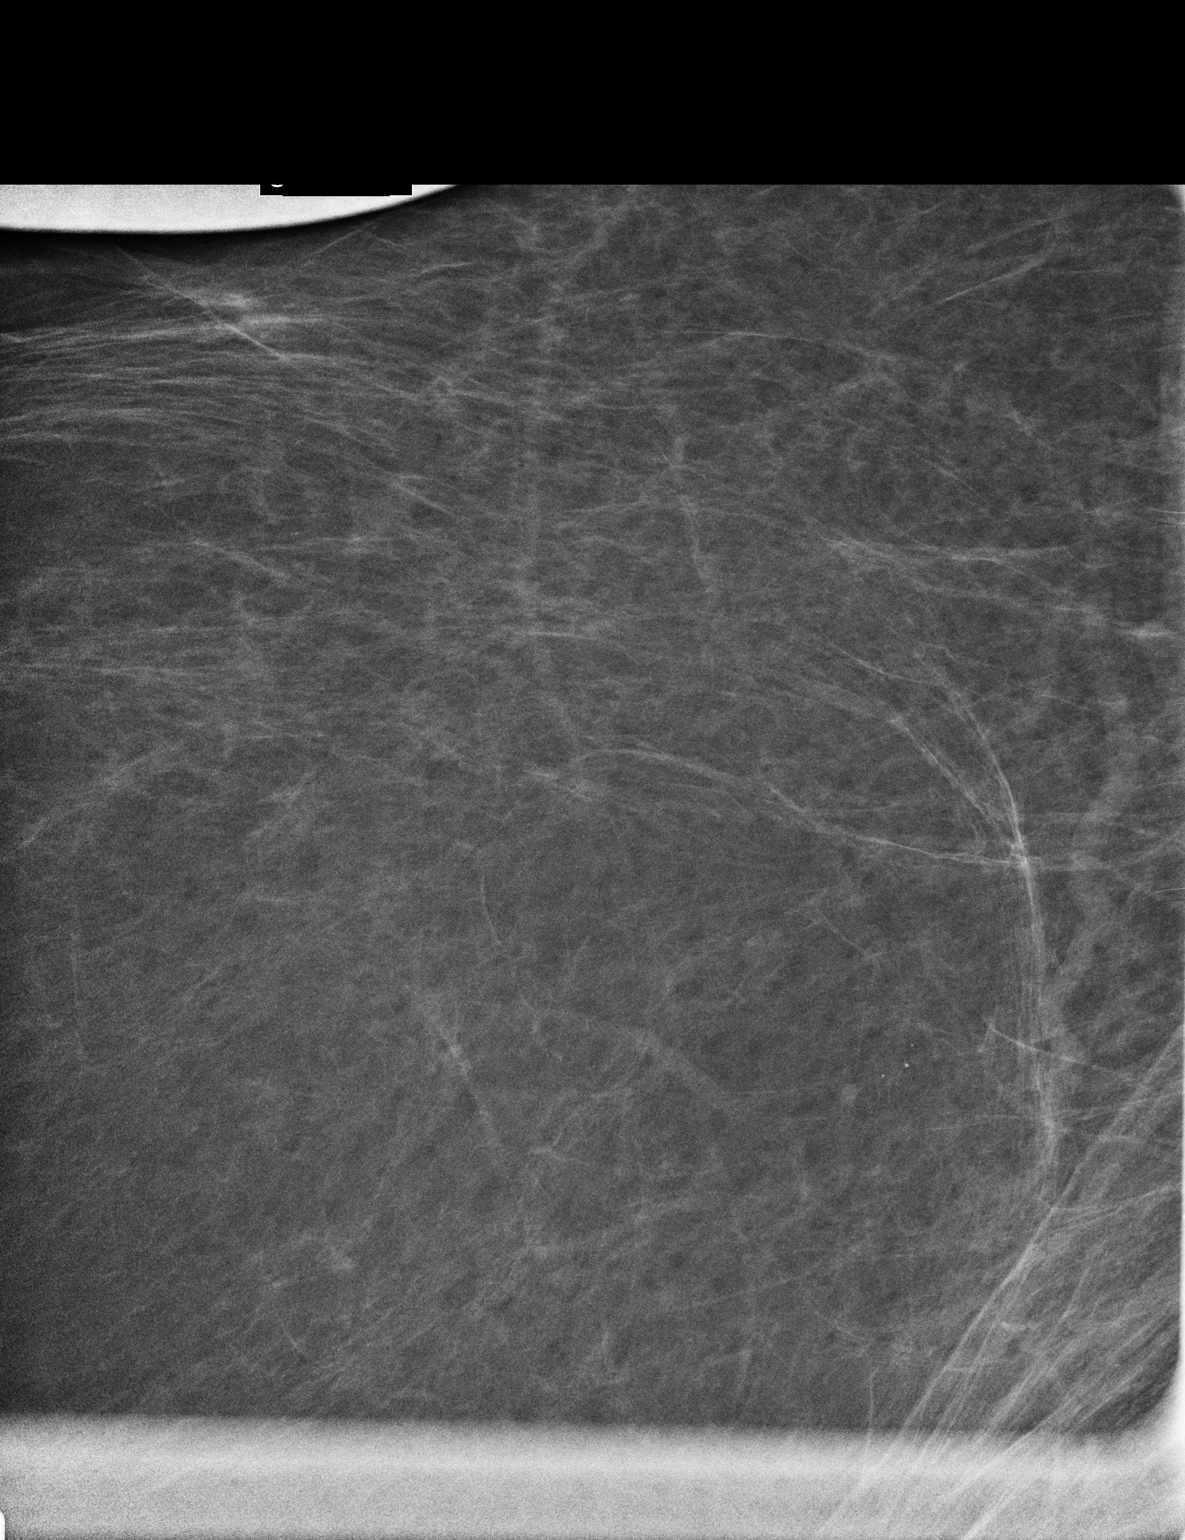

[2 of 2 positions shown; findings below may reference images not displayed]

FINDINGS: Spot magnification views of the right breast demonstrate a 4 mm
group of 3 tiny, punctate, rounded microcalcifications deep in the
lower outer quadrant of the breast.
IMPRESSION: 4 mm group of probably benign calcifications deep in the lower outer
quadrant of the right breast.

RECOMMENDATION:
Right diagnostic mammogram in 6 months. The option of stereotactic
guided core needle biopsy was also discussed with the patient but
not recommended at this time. She is currently comfortable with the
6 month followup.

I have discussed the findings and recommendations with the patient.
Results were also provided in writing at the conclusion of the
visit. If applicable, a reminder letter will be sent to the patient
regarding the next appointment.

BI-RADS CATEGORY  3: Probably benign.

## 2014-08-02 ENCOUNTER — Other Ambulatory Visit: Payer: Self-pay | Admitting: Family Medicine

## 2014-08-14 ENCOUNTER — Telehealth: Payer: Self-pay

## 2014-08-14 NOTE — Telephone Encounter (Signed)
pls verify what scabies med is covered by her ins and prescribing direction, call pharmacy, enter and I will send  send after i get the information

## 2014-08-14 NOTE — Telephone Encounter (Signed)
Please advise if rx can be sent

## 2014-08-15 ENCOUNTER — Other Ambulatory Visit: Payer: Self-pay | Admitting: Family Medicine

## 2014-08-15 MED ORDER — CROTAMITON 10 % EX CREA: TOPICAL_CREAM | CUTANEOUS | Status: DC

## 2014-08-15 NOTE — Telephone Encounter (Signed)
Med sent pls let her know 

## 2014-08-15 NOTE — Addendum Note (Signed)
Addended by: Tula Nakayama E on: 08/15/2014 11:37 AM   Modules accepted: Orders

## 2014-08-15 NOTE — Telephone Encounter (Signed)
Eurax is preferred.

## 2014-08-19 ENCOUNTER — Telehealth: Payer: Self-pay | Admitting: Family Medicine

## 2014-08-19 MED ORDER — PERMETHRIN 1 % EX LIQD: Freq: Once | CUTANEOUS | Status: DC

## 2014-08-19 NOTE — Addendum Note (Signed)
Addended by: Tula Nakayama E on: 08/19/2014 12:13 PM   Modules accepted: Orders

## 2014-08-19 NOTE — Telephone Encounter (Signed)
Script sent  

## 2014-08-19 NOTE — Telephone Encounter (Signed)
Script is printed pls send in, thanks

## 2014-08-19 NOTE — Telephone Encounter (Signed)
permethrin cream is preferred

## 2014-08-19 NOTE — Telephone Encounter (Signed)
Pharmacy is saying that Crotamiton 10% is unavailable.  Please advise.

## 2014-08-19 NOTE — Telephone Encounter (Signed)
pls let me know what is an available alternative that her ins will cover , if none advise her to use the OTC medication , the pharmacy had given this med as what was covered

## 2014-08-20 ENCOUNTER — Other Ambulatory Visit: Payer: Self-pay | Admitting: Family Medicine

## 2014-08-28 ENCOUNTER — Other Ambulatory Visit: Payer: Self-pay

## 2014-08-28 MED ORDER — PHENTERMINE HCL 37.5 MG PO TABS: ORAL_TABLET | ORAL | Status: DC

## 2014-09-03 ENCOUNTER — Other Ambulatory Visit (HOSPITAL_COMMUNITY): Payer: Self-pay | Admitting: Respiratory Therapy

## 2014-09-03 DIAGNOSIS — G4730 Sleep apnea, unspecified: Secondary | ICD-10-CM

## 2014-09-16 ENCOUNTER — Encounter: Payer: Self-pay | Admitting: Family Medicine

## 2014-09-30 ENCOUNTER — Other Ambulatory Visit (HOSPITAL_COMMUNITY): Payer: Self-pay | Admitting: Respiratory Therapy

## 2014-09-30 DIAGNOSIS — G4730 Sleep apnea, unspecified: Secondary | ICD-10-CM

## 2014-10-06 ENCOUNTER — Other Ambulatory Visit: Payer: Self-pay | Admitting: Family Medicine

## 2014-11-01 ENCOUNTER — Ambulatory Visit: Payer: BC Managed Care – PPO | Admitting: Family Medicine

## 2014-11-01 ENCOUNTER — Encounter: Payer: Self-pay | Admitting: Family Medicine

## 2014-11-04 ENCOUNTER — Other Ambulatory Visit: Payer: Self-pay | Admitting: Family Medicine

## 2014-12-07 ENCOUNTER — Other Ambulatory Visit: Payer: Self-pay | Admitting: Family Medicine

## 2014-12-08 ENCOUNTER — Other Ambulatory Visit: Payer: Self-pay | Admitting: Family Medicine

## 2014-12-09 ENCOUNTER — Other Ambulatory Visit: Payer: Self-pay

## 2014-12-09 MED ORDER — VENLAFAXINE HCL ER 37.5 MG PO CP24: ORAL_CAPSULE | ORAL | Status: DC

## 2014-12-12 ENCOUNTER — Other Ambulatory Visit: Payer: Self-pay

## 2014-12-12 DIAGNOSIS — R7303 Prediabetes: Secondary | ICD-10-CM

## 2014-12-18 ENCOUNTER — Ambulatory Visit (INDEPENDENT_AMBULATORY_CARE_PROVIDER_SITE_OTHER): Payer: 59 | Admitting: Family Medicine

## 2014-12-18 ENCOUNTER — Encounter: Payer: Self-pay | Admitting: Family Medicine

## 2014-12-18 VITALS — BP 120/82 | HR 98 | Resp 16 | Ht 67.0 in | Wt 311.0 lb

## 2014-12-18 DIAGNOSIS — L630 Alopecia (capitis) totalis: Secondary | ICD-10-CM

## 2014-12-18 DIAGNOSIS — R7303 Prediabetes: Secondary | ICD-10-CM

## 2014-12-18 DIAGNOSIS — R7309 Other abnormal glucose: Secondary | ICD-10-CM

## 2014-12-18 DIAGNOSIS — F418 Other specified anxiety disorders: Secondary | ICD-10-CM

## 2014-12-18 DIAGNOSIS — N3001 Acute cystitis with hematuria: Secondary | ICD-10-CM

## 2014-12-18 DIAGNOSIS — B369 Superficial mycosis, unspecified: Secondary | ICD-10-CM

## 2014-12-18 LAB — BASIC METABOLIC PANEL
BUN: 10 mg/dL (ref 6–23)
CO2: 27 meq/L (ref 19–32)
Calcium: 10.1 mg/dL (ref 8.4–10.5)
Chloride: 100 mEq/L (ref 96–112)
Creat: 0.66 mg/dL (ref 0.50–1.10)
GLUCOSE: 79 mg/dL (ref 70–99)
Potassium: 4.5 mEq/L (ref 3.5–5.3)
SODIUM: 139 meq/L (ref 135–145)

## 2014-12-18 LAB — POCT URINALYSIS DIPSTICK
BILIRUBIN UA: NEGATIVE
Glucose, UA: NEGATIVE
Nitrite, UA: NEGATIVE
Protein, UA: NEGATIVE
Spec Grav, UA: 1.01
Urobilinogen, UA: 0.2
pH, UA: 6.5

## 2014-12-18 LAB — HEMOGLOBIN A1C
HEMOGLOBIN A1C: 6 % — AB (ref <5.7)
Mean Plasma Glucose: 126 mg/dL — ABNORMAL HIGH (ref <117)

## 2014-12-18 MED ORDER — CIPROFLOXACIN HCL 500 MG PO TABS: 500.0000 mg | ORAL_TABLET | Freq: Two times a day (BID) | ORAL | Status: DC

## 2014-12-18 MED ORDER — CLOTRIMAZOLE-BETAMETHASONE 1-0.05 % EX CREA: 1.0000 "application " | TOPICAL_CREAM | Freq: Two times a day (BID) | CUTANEOUS | Status: DC

## 2014-12-18 MED ORDER — PHENTERMINE HCL 37.5 MG PO TBDP: ORAL_TABLET | ORAL | Status: DC

## 2014-12-18 NOTE — Patient Instructions (Signed)
F/u in 4 month, call if you need me before  Resume phentermine daily and stick with an eating plan  Antifungal cream prescribed for rash under arm  Antibiotic ointment likel OTC neosporin to be applied twice daily to pimple on left breast   Urine is checked  Weight loss goal of 4 pounds per month  Keep active  Rogaine is over the counter, for now , I suggest you hold off on this and keep doing as you are  doing

## 2014-12-18 NOTE — Progress Notes (Signed)
Subjective:    Patient ID: Jennifer [name], female    DOB: February 19, 1976, 39 y.o.   MRN: [id number]  HPI The PT is here for follow up and re-evaluation of chronic medical conditions, medication management and review of any available recent lab and radiology data.  Preventive health is updated, specifically  Cancer screening and Immunization.   Questions or concerns regarding consultations or procedures which the PT has had in the interim are  addressed. The PT denies any adverse reactions to current medications since the last visit.  C/o pruritic rash under right armpit, no new deodorant C/o ongoing weight gain, constantly up and down, C/o right breast pimple over past several days C/o urinary frequency with burning, no fever, chills or flank pain, thi in the last 5 days    Review of Systems See HPI Denies recent fever or chills. Denies sinus pressure, nasal congestion, ear pain or sore throat. Denies chest congestion, productive cough or wheezing. Denies chest pains, palpitations and leg swelling Denies abdominal pain, nausea, vomiting,diarrhea or constipation.    Denies joint pain, swelling and limitation in mobility. Denies headaches, seizures, numbness, or tingling. Denies uncontrolled  depression, anxiety or insomnia.      Objective:   Physical Exam  BP 120/82 mmHg  Pulse 98  Resp 16  Ht 5\' 7"  (1.702 m)  Wt 311 lb (141.069 kg)  BMI 48.70 kg/m2  SpO2 96% Patient alert and oriented and in no cardiopulmonary distress.  HEENT: No facial asymmetry, EOMI,   oropharynx pink and moist.  Neck supple no JVD, no mass.  Chest: Clear to auscultation bilaterally.  CVS: S1, S2 no murmurs, no S3.Regular rate.  ABD: Soft non tender. No renal angle or suprapubic tenderness  Ext: No edema  MS: Adequate ROM spine, shoulders, hips and knees.  Skin: fungal rash in right axilla, small pimple on left breast, no surrounding erythema Psych: Good eye contact, normal affect. Memory intact  not anxious or depressed appearing.  CNS: CN 2-12 intact, power,  normal throughout.no focal deficits noted.       Assessment & Plan:  Dermatomycosis Topical prep to affected area   Morbid obesity Deteriorated. Patient re-educated about  the importance of commitment to a  minimum of 150 minutes of exercise per week. The importance of healthy food choices with portion control discussed. Encouraged to start a food diary, count calories and to consider  joining a support group. Sample diet sheets offered. Goals set by the patient for the next several months.      Acute cystitis Abnormal uA and symptomatic, 3 day course of cipro prescribed   Depression with anxiety Improved, continuee current management   Prediabetes Improved, continue current management Patient educated about the importance of limiting  Carbohydrate intake , the need to commit to daily physical activity for a minimum of 30 minutes , and to commit weight loss. The fact that changes in all these areas will reduce or eliminate all together the development of diabetes is stressed.      Alopecia (capitis) totalis Pt conflicted over best option for management, unable to afford derm eval at this time, I explained that topical rogaine is lifetime commitment and she has a large area of herscalp involved

## 2014-12-20 ENCOUNTER — Encounter: Payer: Self-pay | Admitting: Family Medicine

## 2014-12-20 LAB — URINE CULTURE

## 2014-12-21 DIAGNOSIS — N3000 Acute cystitis without hematuria: Secondary | ICD-10-CM

## 2014-12-21 DIAGNOSIS — N3001 Acute cystitis with hematuria: Secondary | ICD-10-CM | POA: Insufficient documentation

## 2014-12-21 NOTE — Assessment & Plan Note (Signed)
Improved, continuee current management

## 2014-12-21 NOTE — Assessment & Plan Note (Signed)
Topical prep to affected area

## 2014-12-21 NOTE — Assessment & Plan Note (Signed)
Abnormal uA and symptomatic, 3 day course of cipro prescribed

## 2014-12-21 NOTE — Assessment & Plan Note (Signed)
Deteriorated. Patient re-educated about  the importance of commitment to a  minimum of 150 minutes of exercise per week. The importance of healthy food choices with portion control discussed. Encouraged to start a food diary, count calories and to consider  joining a support group. Sample diet sheets offered. Goals set by the patient for the next several months.    

## 2014-12-21 NOTE — Assessment & Plan Note (Signed)
Pt conflicted over best option for management, unable to afford derm eval at this time, I explained that topical rogaine is lifetime commitment and she has a large area of herscalp involved

## 2014-12-21 NOTE — Assessment & Plan Note (Signed)
Improved, continue current management Patient educated about the importance of limiting  Carbohydrate intake , the need to commit to daily physical activity for a minimum of 30 minutes , and to commit weight loss. The fact that changes in all these areas will reduce or eliminate all together the development of diabetes is stressed.

## 2015-01-14 ENCOUNTER — Telehealth: Payer: Self-pay | Admitting: Family Medicine

## 2015-01-14 NOTE — Telephone Encounter (Signed)
noted 

## 2015-01-18 ENCOUNTER — Other Ambulatory Visit: Payer: Self-pay | Admitting: Family Medicine

## 2015-01-23 ENCOUNTER — Other Ambulatory Visit: Payer: Self-pay | Admitting: Family Medicine

## 2015-01-29 ENCOUNTER — Other Ambulatory Visit: Payer: Self-pay | Admitting: Family Medicine

## 2015-01-29 DIAGNOSIS — R921 Mammographic calcification found on diagnostic imaging of breast: Secondary | ICD-10-CM

## 2015-01-29 DIAGNOSIS — Z09 Encounter for follow-up examination after completed treatment for conditions other than malignant neoplasm: Secondary | ICD-10-CM

## 2015-02-11 ENCOUNTER — Ambulatory Visit (HOSPITAL_COMMUNITY)
Admission: RE | Admit: 2015-02-11 | Discharge: 2015-02-11 | Disposition: A | Discharge status: Home / Self Care | Payer: 59 | Source: Ambulatory Visit | Attending: Family Medicine | Admitting: Family Medicine

## 2015-02-11 DIAGNOSIS — R928 Other abnormal and inconclusive findings on diagnostic imaging of breast: Secondary | ICD-10-CM | POA: Insufficient documentation

## 2015-02-11 DIAGNOSIS — Z09 Encounter for follow-up examination after completed treatment for conditions other than malignant neoplasm: Secondary | ICD-10-CM

## 2015-02-11 DIAGNOSIS — R921 Mammographic calcification found on diagnostic imaging of breast: Secondary | ICD-10-CM

## 2015-02-11 IMAGING — MG MM DIGITAL DIAGNOSTIC UNILAT R
4 series · 4 of 4 positions shown · non-contrast
Comparison: Prior exams

ACR Breast Density Category a: The breast tissue is almost entirely
fatty.

CLINICAL DATA: Six month followup for 3 small benign-appearing
calcifications in the right breast.

EXAM:
DIGITAL DIAGNOSTIC RIGHT MAMMOGRAM WITH CAD

[R CC (1 of 2)]
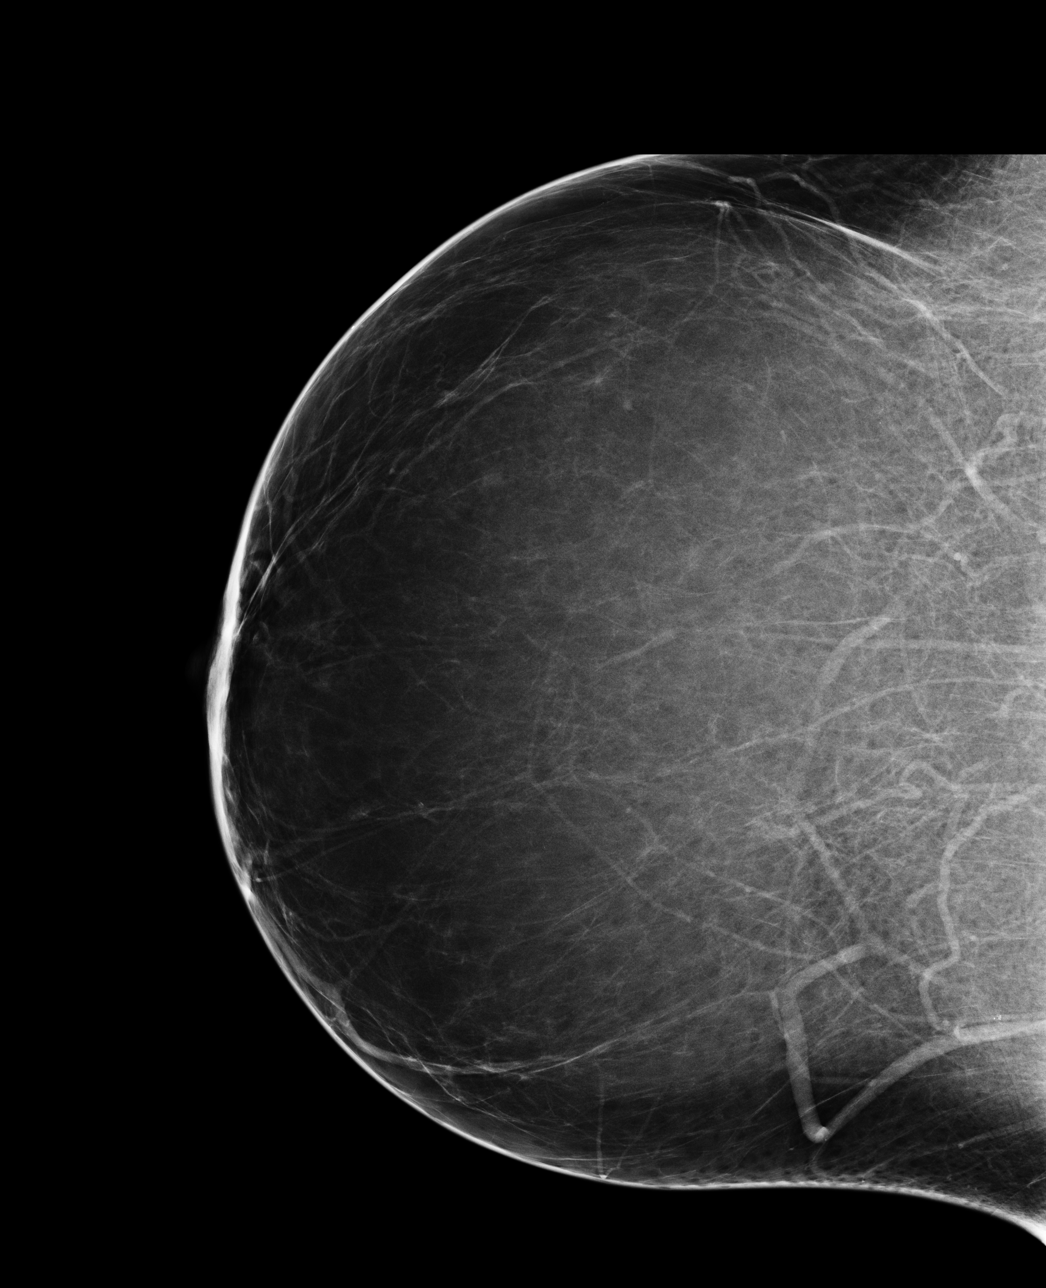

[R MLO]
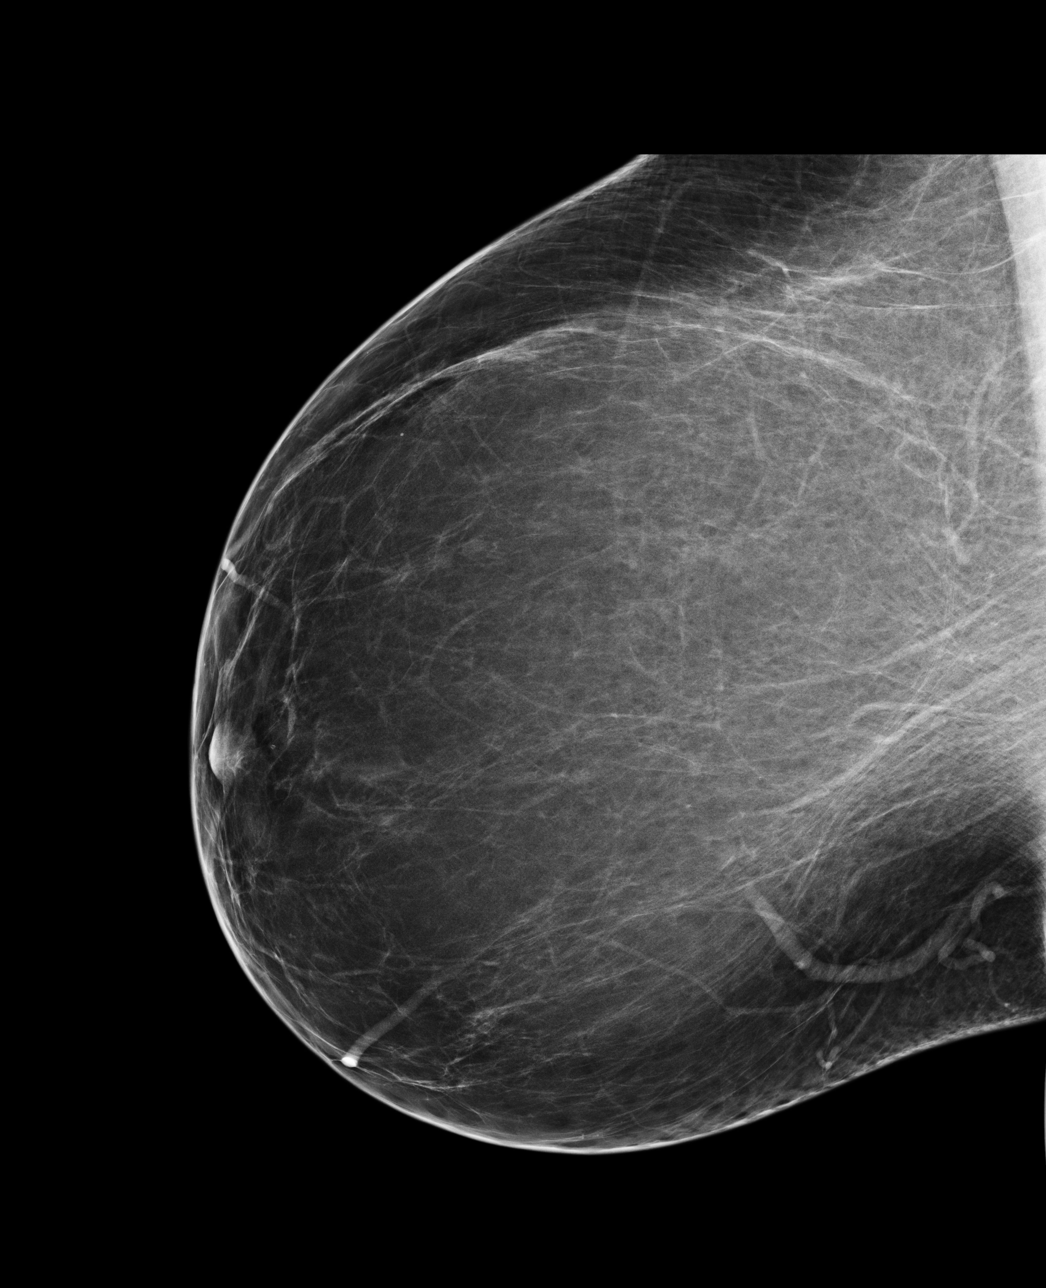

[R ML]
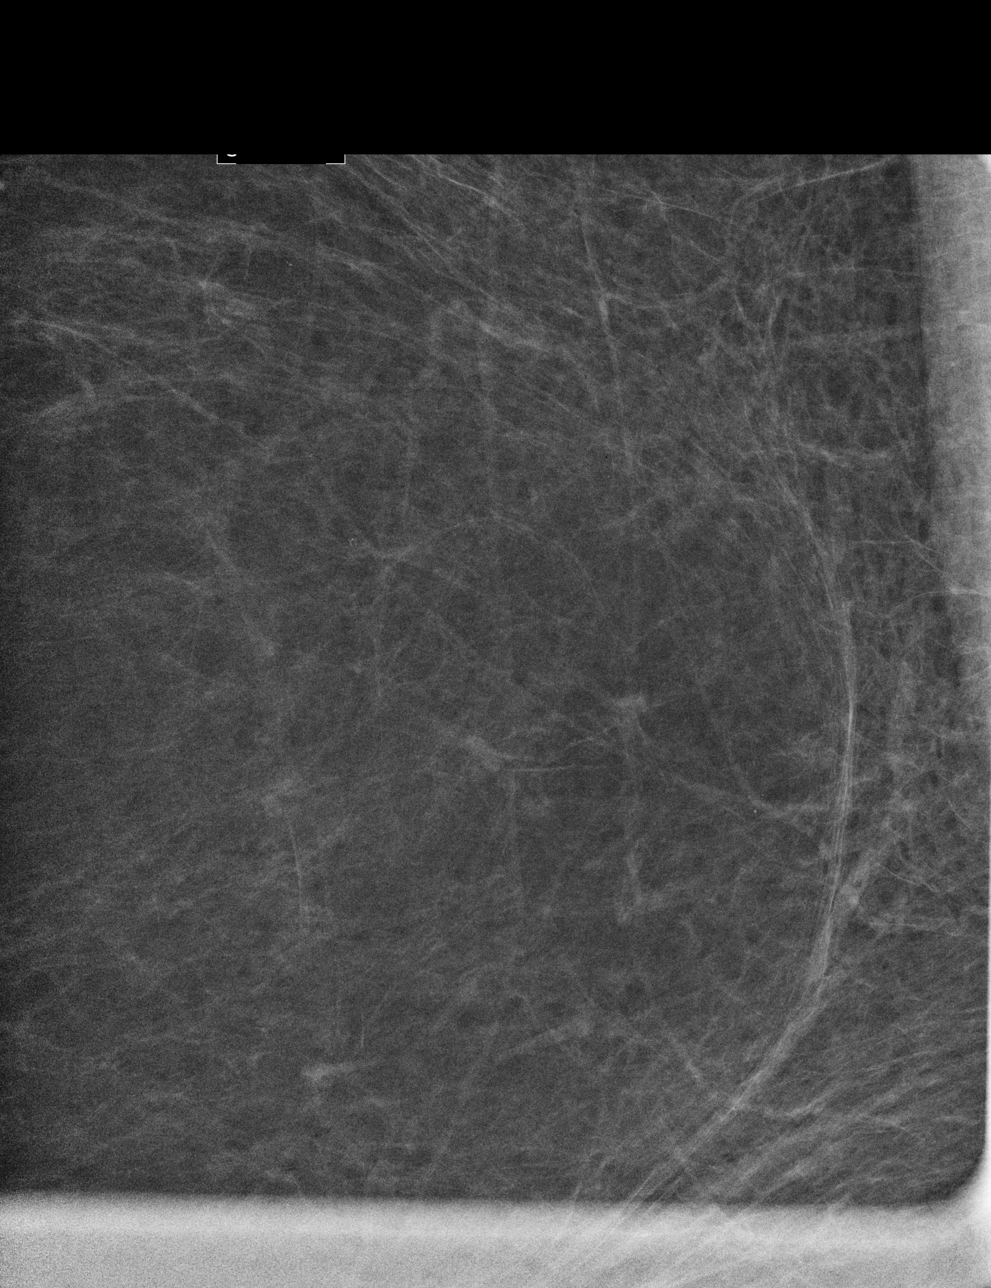

[R CC (2 of 2)]
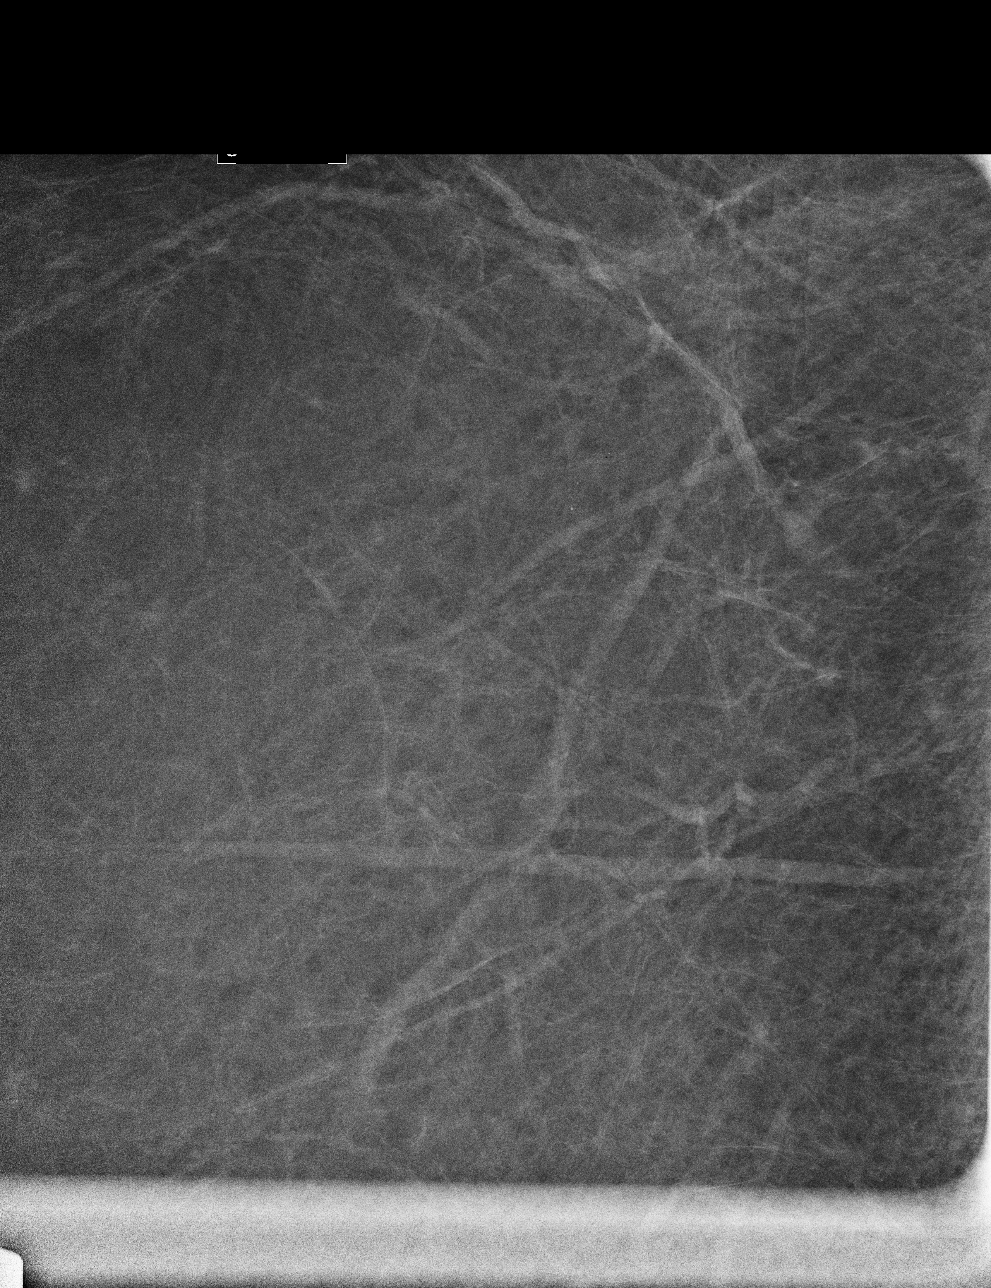

[4 of 4 positions shown; findings below may reference images not displayed]

FINDINGS: Three small round calcifications seen previously in the right breast
trauma posteriorly and laterally, are stable. There are no new
calcifications. There are no breast masses or areas of distortion.

Mammographic images were processed with CAD.
IMPRESSION: Probably benign right breast calcifications. Additional short-term
follow-up recommended.

RECOMMENDATION:
Diagnostic right breast mammography with magnification views 6
months. Patient will be due for her left breast screening study at
that time.

I have discussed the findings and recommendations with the patient.
Results were also provided in writing at the conclusion of the
visit. If applicable, a reminder letter will be sent to the patient
regarding the next appointment.

BI-RADS CATEGORY  3: Probably benign finding(s) - short interval
follow-up suggested.

## 2015-02-21 ENCOUNTER — Telehealth: Payer: Self-pay

## 2015-02-21 DIAGNOSIS — R1013 Epigastric pain: Secondary | ICD-10-CM

## 2015-02-21 DIAGNOSIS — K5730 Diverticulosis of large intestine without perforation or abscess without bleeding: Secondary | ICD-10-CM

## 2015-02-21 NOTE — Telephone Encounter (Signed)
Referral entered for Dr. Gala Romney

## 2015-02-24 ENCOUNTER — Encounter: Payer: Self-pay | Admitting: Internal Medicine

## 2015-03-14 ENCOUNTER — Ambulatory Visit: Payer: 59 | Admitting: Gastroenterology

## 2015-03-20 ENCOUNTER — Telehealth: Payer: Self-pay | Admitting: *Deleted

## 2015-03-20 NOTE — Telephone Encounter (Signed)
Open in Error.

## 2015-03-26 ENCOUNTER — Encounter: Payer: Self-pay | Admitting: Nurse Practitioner

## 2015-03-26 ENCOUNTER — Ambulatory Visit (HOSPITAL_COMMUNITY)
Admission: RE | Admit: 2015-03-26 | Discharge: 2015-03-26 | Disposition: A | Discharge status: Home / Self Care | Payer: 59 | Source: Ambulatory Visit | Attending: Nurse Practitioner | Admitting: Nurse Practitioner

## 2015-03-26 ENCOUNTER — Ambulatory Visit (INDEPENDENT_AMBULATORY_CARE_PROVIDER_SITE_OTHER): Payer: 59 | Admitting: Nurse Practitioner

## 2015-03-26 VITALS — BP 142/91 | HR 103 | Temp 98.0°F | Ht 67.0 in | Wt 315.4 lb

## 2015-03-26 DIAGNOSIS — K642 Third degree hemorrhoids: Secondary | ICD-10-CM

## 2015-03-26 DIAGNOSIS — K5730 Diverticulosis of large intestine without perforation or abscess without bleeding: Secondary | ICD-10-CM

## 2015-03-26 DIAGNOSIS — R195 Other fecal abnormalities: Secondary | ICD-10-CM

## 2015-03-26 DIAGNOSIS — R1030 Lower abdominal pain, unspecified: Secondary | ICD-10-CM | POA: Insufficient documentation

## 2015-03-26 DIAGNOSIS — K5900 Constipation, unspecified: Secondary | ICD-10-CM | POA: Insufficient documentation

## 2015-03-26 DIAGNOSIS — K5731 Diverticulosis of large intestine without perforation or abscess with bleeding: Secondary | ICD-10-CM | POA: Insufficient documentation

## 2015-03-26 DIAGNOSIS — K649 Unspecified hemorrhoids: Secondary | ICD-10-CM | POA: Insufficient documentation

## 2015-03-26 LAB — CBC WITH DIFFERENTIAL/PLATELET
Basophils Absolute: 0 10*3/uL (ref 0.0–0.1)
Basophils Relative: 0 % (ref 0–1)
Eosinophils Absolute: 0.2 10*3/uL (ref 0.0–0.7)
Eosinophils Relative: 3 % (ref 0–5)
HCT: 39.5 % (ref 36.0–46.0)
HEMOGLOBIN: 13.4 g/dL (ref 12.0–15.0)
Lymphocytes Relative: 48 % — ABNORMAL HIGH (ref 12–46)
Lymphs Abs: 3.4 10*3/uL (ref 0.7–4.0)
MCH: 29.8 pg (ref 26.0–34.0)
MCHC: 33.9 g/dL (ref 30.0–36.0)
MCV: 87.8 fL (ref 78.0–100.0)
MPV: 8.6 fL (ref 8.6–12.4)
Monocytes Absolute: 0.4 10*3/uL (ref 0.1–1.0)
Monocytes Relative: 5 % (ref 3–12)
NEUTROS ABS: 3.1 10*3/uL (ref 1.7–7.7)
Neutrophils Relative %: 44 % (ref 43–77)
Platelets: 357 10*3/uL (ref 150–400)
RBC: 4.5 MIL/uL (ref 3.87–5.11)
RDW: 14.2 % (ref 11.5–15.5)
WBC: 7.1 10*3/uL (ref 4.0–10.5)

## 2015-03-26 LAB — COMPREHENSIVE METABOLIC PANEL
ALBUMIN: 4.1 g/dL (ref 3.5–5.2)
ALT: 12 U/L (ref 0–35)
AST: 12 U/L (ref 0–37)
Alkaline Phosphatase: 73 U/L (ref 39–117)
BUN: 8 mg/dL (ref 6–23)
CHLORIDE: 104 meq/L (ref 96–112)
CO2: 23 meq/L (ref 19–32)
Calcium: 9.7 mg/dL (ref 8.4–10.5)
Creat: 0.65 mg/dL (ref 0.50–1.10)
Glucose, Bld: 76 mg/dL (ref 70–99)
POTASSIUM: 4.1 meq/L (ref 3.5–5.3)
Sodium: 138 mEq/L (ref 135–145)
TOTAL PROTEIN: 6.9 g/dL (ref 6.0–8.3)
Total Bilirubin: 0.3 mg/dL (ref 0.2–1.2)

## 2015-03-26 IMAGING — DX DG ABDOMEN 2V
3 series · 4 of 4 positions shown · non-contrast
Comparison: None.

CLINICAL DATA: Lower abdominal pain.  Constipation.

EXAM:
ABDOMEN - 2 VIEW

[Series 1: abdomen erect · 0.14mm/px · 2 of 2 slices shown]
[im 1/2]
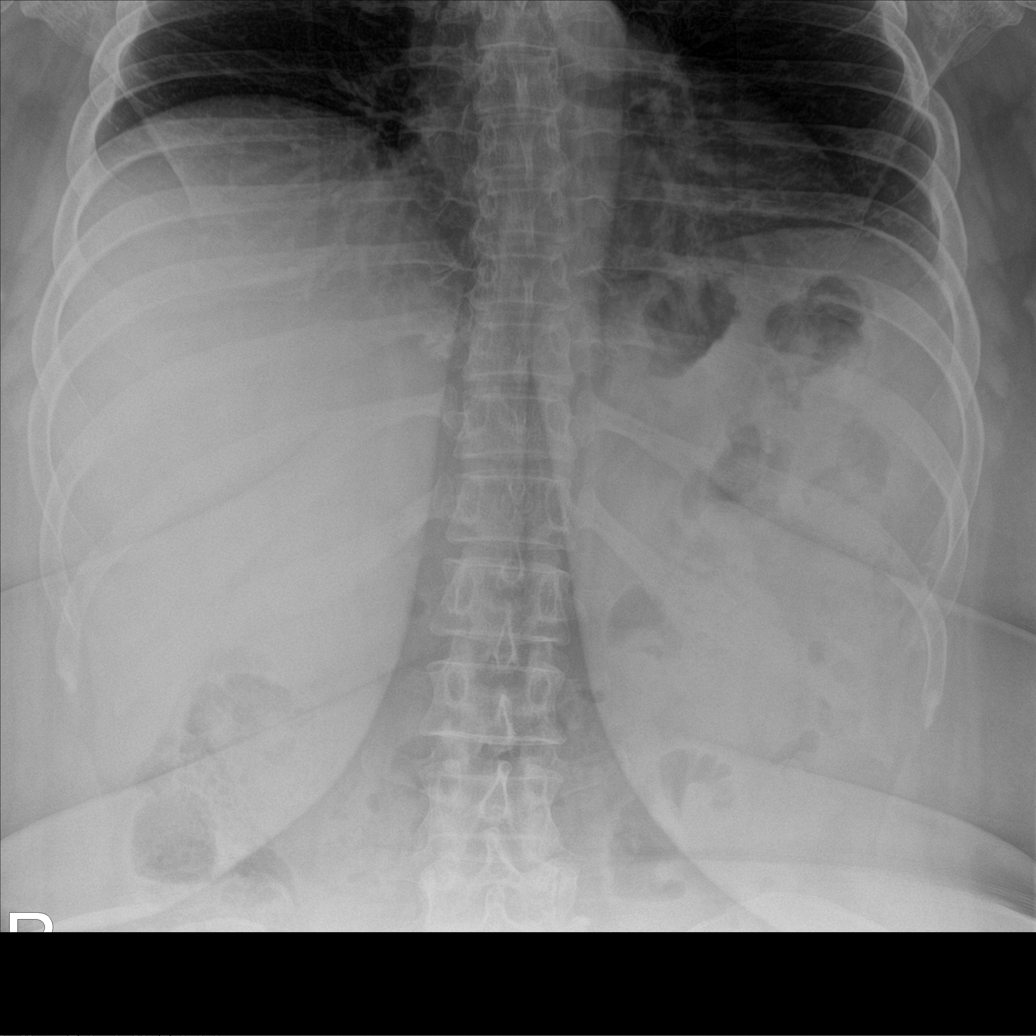
[im 2/2]
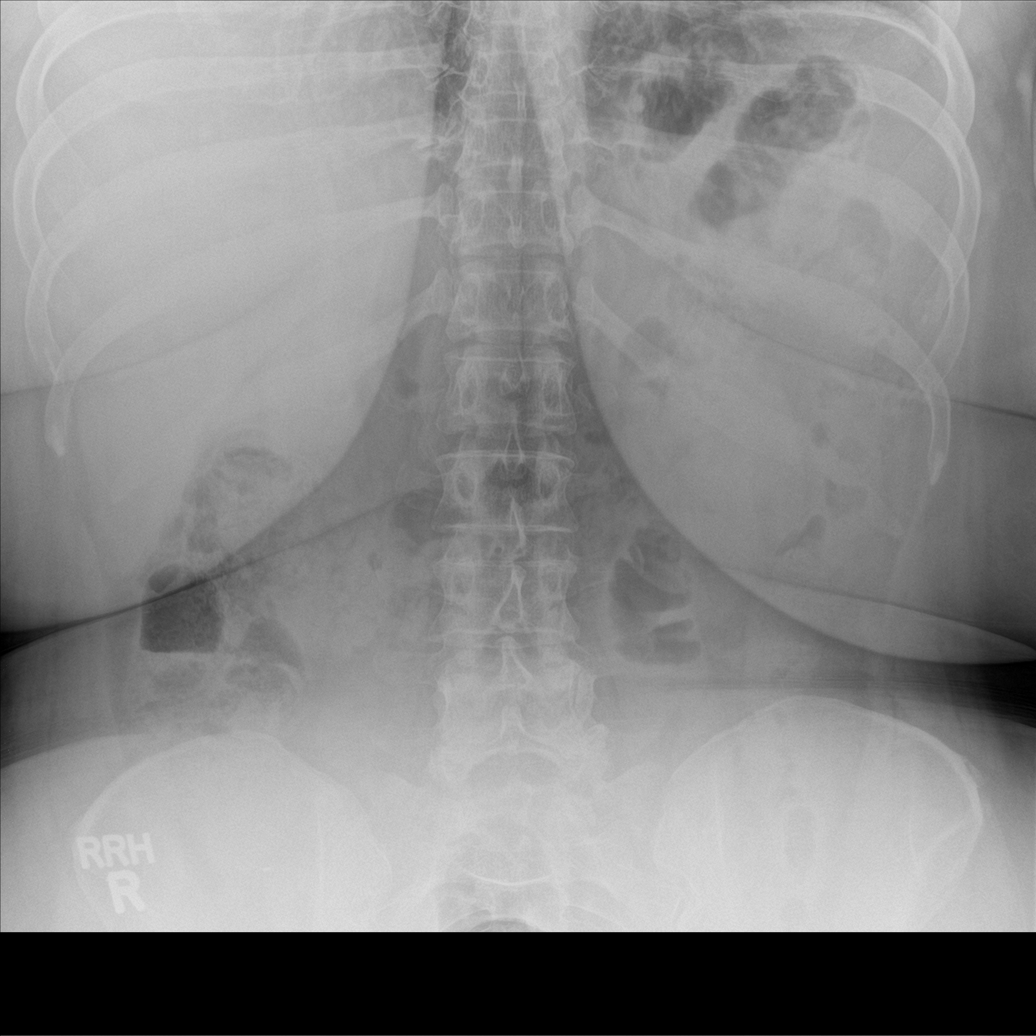

[abdomen supine (1 of 2)]
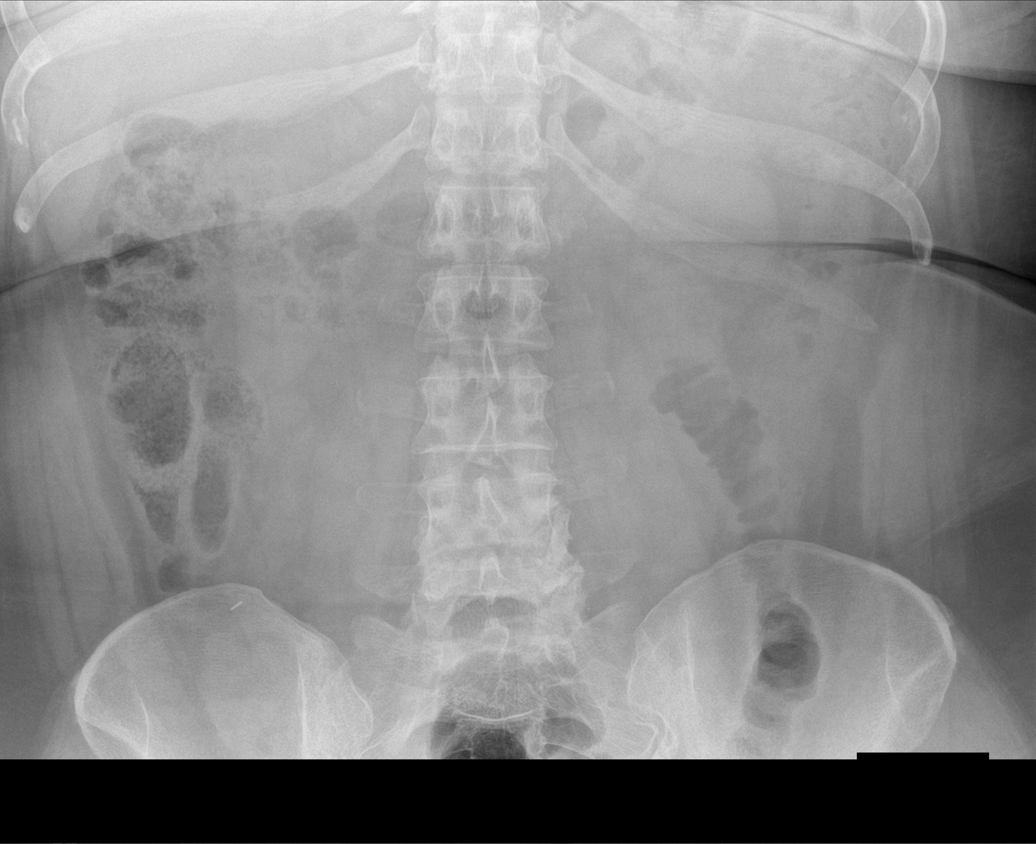

[abdomen supine (2 of 2)]
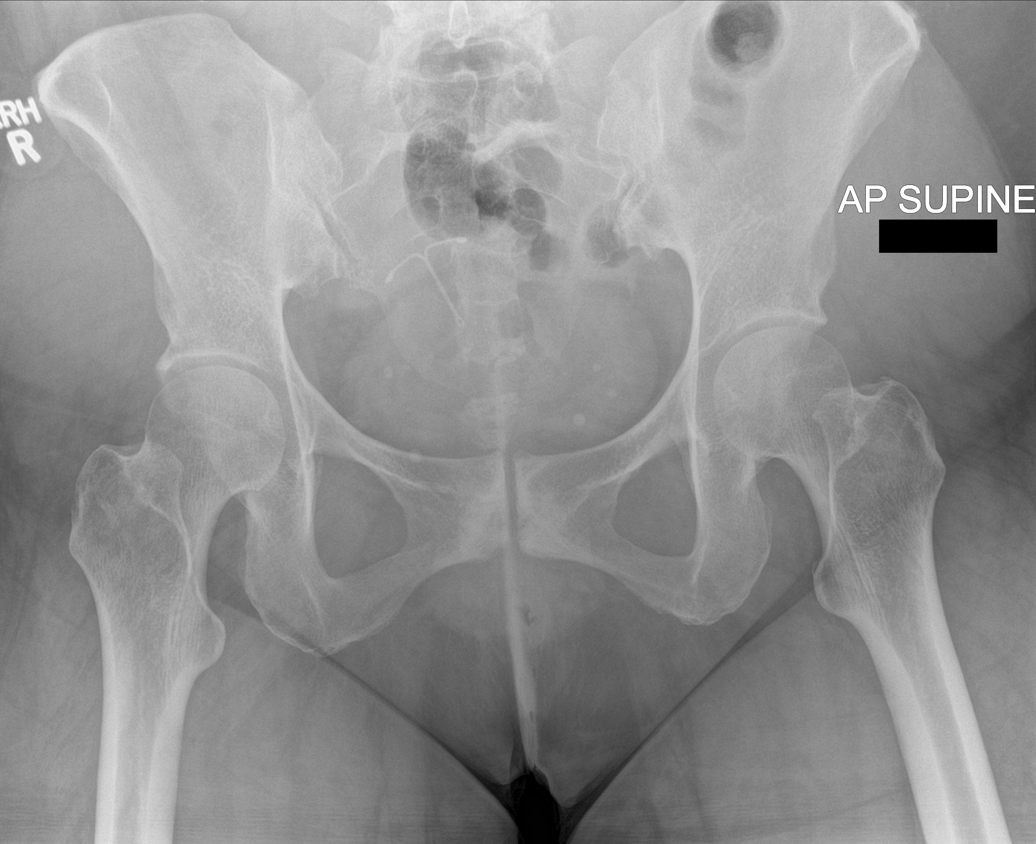

[4 of 4 positions shown; findings below may reference images not displayed]

FINDINGS: Upright and supine views. The upright views demonstrate no free
intraperitoneal air or significant air-fluid levels. Supine views
demonstrate moderate amount of ascending colonic stool. No gaseous
distention of bowel. Intrauterine device. Probable phleboliths in
the pelvis. Degenerative sclerosis of the symphysis pubis. No
abnormal abdominal calcifications. No appendicolith.
IMPRESSION: No acute findings.

## 2015-03-26 NOTE — Assessment & Plan Note (Signed)
Patient with chronic soft to loose stools on metformin. Did have episode approximately 1 month ago of worsening diarrhea along with right lower quadrant abdominal pain. She contacted her PCP who advised the clear liquid diet and her symptoms subsequently resolved. Is not currently having any pain. Her symptoms are likely a viral gastroenteritis which subsequently resolved with conservative treatment. However she has a history of diverticulosis and is concerned about this. Order CBC, CMP, abdominal x-ray. She does have sensation of incomplete emptying although her stools are soft. We'll have her return for 3 month follow-up. Possible component of irritable bowel syndrome which can be further explored on her follow-up visit.

## 2015-03-26 NOTE — Assessment & Plan Note (Signed)
Patient with a history of internal hemorrhoids deemed a good candidate for hemorrhoid banding in office on her last colonoscopy approximately 1 year ago. Continues to have some hemorrhoid symptoms including toilet tissue hematochezia. At this point she is wanting to proceed with an office banding. We will schedule her for a banding office visit with Dr. Gala Romney.

## 2015-03-26 NOTE — Progress Notes (Signed)
Referring Provider: [name], MD Primary Care Physician:  Tula Nakayama, MD Primary GI: Dr. Gala Romney  Chief Complaint  Patient presents with  . Abdominal Pain  . Diverticulitis    HPI:   39-year-old female presents for follow-up on dyspepsia and diverticulosis. Last colonoscopy completed 03/13/2014. Findings included a adequate preparation, grade 3 hemorrhoids, scattered left-sided and transverse diverticula, or major of colonic mucosa appeared normal except for single diminutive polyp in the base the cecum. This polyp was removed by cold biopsy and found to be benign lymphoid polyp on pathology. Recommended visit for hemorrhoid banding scheduled for 2-3 weeks post procedure. However the patient missed this appointment.  Today she states she is still interested in pursuing hemorrhoid banding.   Was having RLQ abdominal pain last month. Was unable to have a bowel movement at that time as well. PCP placed her on clear liquid diet which helped and subsequently had diarrhea. She continues to have diarrhea, but is on Metformin and noticed this around the time of starting metformin. Also had a single episode of hematochezia a few weeks ago and again last week with a known history of grade 3 hemorrhoids and was constipated at that time. Has some discomfort with bowel movements. Has a daily bowel movement and typically falls between 5-6 Bristol scale. Her abdominal pain is relieved with defecation. Drinks about 60 oz water a day as well a few bottles of diet tea. Generally consumes a decent amount of fruit and spinach. After discussing her symptoms with her she associates constipation with difficulty emptying her bowels and not hardened stools. States she doesn't think she eats a good diet and is trying to change this. Denies vomiting, admits occasional vomiting. Has had decreased appetite. Her overall symptoms have improved with an improved diet. Denies any other upper or lower GI symptoms.  Spent approximatly 30 mins with this patient, 50%+ of which was spent on counseling and education.  Past Medical History  Diagnosis Date  . Family history of diabetes mellitus   . History of recurrent UTIs   . Obesity   . Depression   . Alopecia   . Abnormal vaginal Pap smear   . Diabetes mellitus without complication   . Diverticulosis   . Hemorrhoids     Past Surgical History  Procedure Laterality Date  . Appendectomy    . Knee arthroscopy      right  . Colonoscopy N/A 03/13/2014    Dr. Gala Romney- grade 3 hemorrhoids, colonic diverticulosis bx= benign lymphoid polyp    Current Outpatient Prescriptions  Medication Sig Dispense Refill  . busPIRone (BUSPAR) 5 MG tablet Take 1 tablet (5 mg total) by mouth 3 (three) times daily. 90 tablet 5  . clotrimazole-betamethasone (LOTRISONE) cream Apply 1 application topically 2 (two) times daily. Apply twice daily to rash under right arm for 10 days , then as needed 45 g 1  . fluticasone (FLONASE) 50 MCG/ACT nasal spray Place 1 spray into both nostrils daily as needed for allergies or rhinitis.    . hydrocortisone (PROCTOSOL HC) 2.5 % rectal cream Place 1 application rectally 2 (two) times daily. 30 g 0  . metFORMIN (GLUCOPHAGE) 500 MG tablet Take 1 tablet (500 mg total) by mouth 3 (three) times daily. 90 tablet 3  . Phentermine HCl 37.5 MG TBDP One tablet once daily 30 tablet 1  . venlafaxine XR (EFFEXOR-XR) 37.5 MG 24 hr capsule TAKE 1 CAPSULE (37.5 MG TOTAL) BY MOUTH DAILY WITH BREAKFAST. 30 capsule 3  .  ciprofloxacin (CIPRO) 500 MG tablet Take 1 tablet (500 mg total) by mouth 2 (two) times daily. (Patient not taking: Reported on 03/26/2015) 6 tablet 0  . fluconazole (DIFLUCAN) 150 MG tablet One tablet once daily, as needed, for vaginal itch (Patient not taking: Reported on 03/26/2015) 2 tablet 0  . metroNIDAZOLE (FLAGYL) 500 MG tablet Take 1 tablet (500 mg total) by mouth 2 (two) times daily. (Patient not taking: Reported on 03/26/2015) 14 tablet  0  . permethrin (NIX) 1 % liquid Apply topically once. Apply from neck  To feet, one time only (Patient not taking: Reported on 03/26/2015) 118 mL 0  . polyethylene glycol-electrolytes (TRILYTE) 420 G solution Take 4,000 mLs by mouth as directed. (Patient not taking: Reported on 03/26/2015) 4000 mL 0   No current facility-administered medications for this visit.    Allergies as of 03/26/2015  . (No Known Allergies)    Family History  Problem Relation Age of Onset  . Adopted: Yes  . Hypertension Mother   . Diabetes Mother   . Other Mother     cholangiocarcinoma, age 83, deceased    History   Social History  . Marital Status: Single    Spouse Name: N/A  . Number of Children: 4  . Years of Education: N/A   Occupational History  . UNEMPLOYWED SINCE 10/2010    Social History Main Topics  . Smoking status: Never Smoker   . Smokeless tobacco: Never Used  . Alcohol Use: No  . Drug Use: No  . Sexual Activity: Not Currently    Birth Control/ Protection: IUD, Condom   Other Topics Concern  . None   Social History Narrative    Review of Systems: General: Negative for anorexia, weight loss, fever, chills, fatigue, weakness. Eyes: Negative for vision changes.  ENT: Negative for hoarseness, difficulty swallowing.. CV: Negative for chest pain, angina, palpitations, peripheral edema.  Respiratory: Negative for dyspnea at rest, cough, wheezing.  GI: See history of present illness. Derm: Negative for rash or itching.  Neuro: Negative for weakness, memory loss, confusion.  Psych: Negative for anxiety, depression.  Endo: Negative for unusual weight change.  Heme: Negative for bruising or bleeding. Allergy: Negative for rash or hives.   Physical Exam: BP 142/91 mmHg  Pulse 103  Temp(Src) 98 F (36.7 C)  Ht 5\' 7"  (1.702 m)  Wt 315 lb 6.4 oz (143.065 kg)  BMI 49.39 kg/m2  LMP 03/16/2015 General:   Alert and oriented. No distress noted. Pleasant and cooperative.  Head:   Normocephalic and atraumatic. Eyes:  Conjuctiva clear without scleral icterus. Neck:  Supple, without mass or thyromegaly. Lungs:  Clear to auscultation bilaterally. No wheezes, rales, or rhonchi. No distress.  Heart:  S1, S2 present without murmurs, rubs, or gallops. Regular rate and rhythm. Abdomen:  +BS, obese, soft, non-tender and non-distended. No rebound or guarding. No HSM or masses noted. Extremities:  Without edema. Neurologic:  Alert and  oriented x4;  grossly normal neurologically. Skin:  Intact without significant lesions or rashes. Psych:  Alert and cooperative. Normal mood and affect.    03/26/2015 10:05 AM

## 2015-03-26 NOTE — Patient Instructions (Addendum)
1. Have your labs and x-ray done seizure able to. 2. We will schedule you for hemorrhoid banding appointment with Dr. Gala Romney. 3. Return for routine follow-up in 3 months. 4. Call if any worsening symptoms or concerns.    Diverticulosis Diverticulosis is the condition that develops when small pouches (diverticula) form in the wall of your colon. Your colon, or large intestine, is where water is absorbed and stool is formed. The pouches form when the inside layer of your colon pushes through weak spots in the outer layers of your colon. CAUSES  No one knows exactly what causes diverticulosis. RISK FACTORS  Being older than 81. Your risk for this condition increases with age. Diverticulosis is rare in people younger than 40 years. By age 65, almost everyone has it.  Eating a low-fiber diet.  Being frequently constipated.  Being overweight.  Not getting enough exercise.  Smoking.  Taking over-the-counter pain medicines, like aspirin and ibuprofen. SYMPTOMS  Most people with diverticulosis do not have symptoms. DIAGNOSIS  Because diverticulosis often has no symptoms, health care providers often discover the condition during an exam for other colon problems. In many cases, a health care provider will diagnose diverticulosis while using a flexible scope to examine the colon (colonoscopy). TREATMENT  If you have never developed an infection related to diverticulosis, you may not need treatment. If you have had an infection before, treatment may include:  Eating more fruits, vegetables, and grains.  Taking a fiber supplement.  Taking a live bacteria supplement (probiotic).  Taking medicine to relax your colon. HOME CARE INSTRUCTIONS   Drink at least 6-8 glasses of water each day to prevent constipation.  Try not to strain when you have a bowel movement.  Keep all follow-up appointments. If you have had an infection before:  Increase the fiber in your diet as directed by your  health care provider or dietitian.  Take a dietary fiber supplement if your health care provider approves.  Only take medicines as directed by your health care provider. SEEK MEDICAL CARE IF:   You have abdominal pain.  You have bloating.  You have cramps.  You have not gone to the bathroom in 3 days. SEEK IMMEDIATE MEDICAL CARE IF:   Your pain gets worse.  Yourbloating becomes very bad.  You have a fever or chills, and your symptoms suddenly get worse.  You begin vomiting.  You have bowel movements that are bloody or black. MAKE SURE YOU:  Understand these instructions.  Will watch your condition.  Will get help right away if you are not doing well or get worse. Document Released: 07/29/2004 Document Revised: 11/06/2013 Document Reviewed: 09/26/2013 [facility] Patient Information 2015 [place], [place]. This information is not intended to replace advice given to you by your health care provider. Make sure you discuss any questions you have with your health care provider.

## 2015-03-26 NOTE — Progress Notes (Signed)
CC'ED TO PCP 

## 2015-03-26 NOTE — Assessment & Plan Note (Signed)
Patient with a history of diverticulosis on colonoscopy 1 year ago. Patient is very anxious about this idea and is confused about diverticulosis versus diverticulitis. Also concerned about rectal bleeding being from diverticular origin. Lengthy discussion on diverticulosis as a pathological condition, and possible progression to diverticulitis or diverticular bleed. Discussed the nature of likely large amount of bleeding if from a diverticular origin. Discussed signs and symptoms to watch for for diverticular infection lesions or diverticulitis. Patient verbalized understanding. Extensive written education materials were provided in her AVS. She was having right lower quadrant abdominal pain approximately 1 months ago which was resolved clear liquid diet. Also having diarrhea with that abdominal pain, although she does have chronically soft stools on metformin. At this point we'll order an abdominal x-ray, CBC, CMP. Return for 3 month follow-up.

## 2015-04-03 ENCOUNTER — Other Ambulatory Visit (HOSPITAL_COMMUNITY)
Admission: RE | Admit: 2015-04-03 | Discharge: 2015-04-03 | Disposition: A | Discharge status: Home / Self Care | Payer: 59 | Source: Ambulatory Visit | Attending: Family Medicine | Admitting: Family Medicine

## 2015-04-03 ENCOUNTER — Ambulatory Visit (INDEPENDENT_AMBULATORY_CARE_PROVIDER_SITE_OTHER): Payer: 59 | Admitting: Family Medicine

## 2015-04-03 ENCOUNTER — Encounter: Payer: Self-pay | Admitting: Family Medicine

## 2015-04-03 VITALS — BP 140/92 | HR 99 | Resp 16 | Ht 67.0 in | Wt 316.8 lb

## 2015-04-03 DIAGNOSIS — L630 Alopecia (capitis) totalis: Secondary | ICD-10-CM

## 2015-04-03 DIAGNOSIS — N760 Acute vaginitis: Secondary | ICD-10-CM

## 2015-04-03 DIAGNOSIS — R7309 Other abnormal glucose: Secondary | ICD-10-CM

## 2015-04-03 DIAGNOSIS — R7303 Prediabetes: Secondary | ICD-10-CM

## 2015-04-03 DIAGNOSIS — F411 Generalized anxiety disorder: Secondary | ICD-10-CM | POA: Diagnosis not present

## 2015-04-03 DIAGNOSIS — Z113 Encounter for screening for infections with a predominantly sexual mode of transmission: Secondary | ICD-10-CM | POA: Insufficient documentation

## 2015-04-03 DIAGNOSIS — F418 Other specified anxiety disorders: Secondary | ICD-10-CM

## 2015-04-03 MED ORDER — VENLAFAXINE HCL ER 75 MG PO CP24: 75.0000 mg | ORAL_CAPSULE | Freq: Every day | ORAL | Status: DC

## 2015-04-03 MED ORDER — BUSPIRONE HCL 7.5 MG PO TABS: 7.5000 mg | ORAL_TABLET | Freq: Three times a day (TID) | ORAL | Status: DC

## 2015-04-03 NOTE — Patient Instructions (Signed)
F/u in 10 weeks, call if you need me  Before  Dose increases in burspar and effexor and therapy will help A LOT   Enjoy the trip  Fasting CBc, lipid, chem 7 and HBA1C and TSH in 3 month  It is important that you exercise regularly at least 30 minutes 5 times a week. If you develop chest pain, have severe difficulty breathing, or feel very tired, stop exercising immediately and seek medical attention    Thanks for choosing [facility], we consider it a privelige to serve you.

## 2015-04-04 LAB — URINE CYTOLOGY ANCILLARY ONLY
Chlamydia: NEGATIVE
Neisseria Gonorrhea: NEGATIVE
Trichomonas: NEGATIVE

## 2015-04-07 LAB — URINE CYTOLOGY ANCILLARY ONLY: CANDIDA VAGINITIS: NEGATIVE

## 2015-04-08 ENCOUNTER — Telehealth (HOSPITAL_COMMUNITY): Payer: Self-pay | Admitting: *Deleted

## 2015-04-10 MED ORDER — FLUCONAZOLE 150 MG PO TABS: 150.0000 mg | ORAL_TABLET | Freq: Every day | ORAL | Status: DC

## 2015-04-10 MED ORDER — METRONIDAZOLE 500 MG PO TABS: 500.0000 mg | ORAL_TABLET | Freq: Two times a day (BID) | ORAL | Status: DC

## 2015-04-11 ENCOUNTER — Telehealth (HOSPITAL_COMMUNITY): Payer: Self-pay | Admitting: *Deleted

## 2015-04-16 ENCOUNTER — Other Ambulatory Visit: Payer: Self-pay | Admitting: Family Medicine

## 2015-04-21 ENCOUNTER — Other Ambulatory Visit: Payer: Self-pay | Admitting: Family Medicine

## 2015-04-29 ENCOUNTER — Encounter: Payer: Self-pay | Admitting: *Deleted

## 2015-04-29 ENCOUNTER — Ambulatory Visit: Payer: 59 | Admitting: Family Medicine

## 2015-06-07 NOTE — Assessment & Plan Note (Signed)
Symptomatic, urine specimens sent for testing, will treat based on report

## 2015-06-07 NOTE — Assessment & Plan Note (Signed)
Deteriorated. Patient re-educated about  the importance of commitment to a  minimum of 150 minutes of exercise per week.  The importance of healthy food choices with portion control discussed. Encouraged to start a food diary, count calories and to consider  joining a support group. Sample diet sheets offered. Goals set by the patient for the next several months.   Weight /BMI 04/03/2015 03/26/2015 12/18/2014  WEIGHT 316 lb 12.8 oz 315 lb 6.4 oz 311 lb  HEIGHT 5\' 7"  5\' 7"  5\' 7"   BMI 49.61 kg/m2 49.39 kg/m2 48.7 kg/m2    Current exercise per week 60 minutes.

## 2015-06-07 NOTE — Assessment & Plan Note (Signed)
Multifactorial, lack of exercise, morbifd obesiy , depression and possible sleep apnea all contribute, sleep study recommended , wants to hold off currently

## 2015-06-07 NOTE — Assessment & Plan Note (Signed)
unchanged

## 2015-06-07 NOTE — Progress Notes (Signed)
Jennifer [name]     MRN: [id number]      DOB: [birth year]   HPI Jennifer [name] is here for follow up and re-evaluation of chronic medical conditions, medication management and review of any available recent lab and radiology data.  Preventive health is updated, specifically  Cancer screening and Immunization.   Saw GI last week  For increased abdominal pain,. And fullness  And is being treated for this. C/o increased and uncontrolled depression and anxiety, having flashbacks which are very disturbing which she has not previously discussed and now wants to as she realized this  disturbing her greatly. Currently stressed as she ahs been preparing for an upcoming cruise, getting her children situated  ROS Denies recent fever or chills. Denies sinus pressure, nasal congestion, ear pain or sore throat. Denies chest congestion, productive cough or wheezing. Denies chest pains, palpitations and leg swelling .c/o increased malodorous vaginal d/c wants this checked   Denies dysuria, frequency, hesitancy or incontinence. Denies joint pain, swelling and limitation in mobility. Denies headaches, seizures, numbness, or tingling. . Denies skin break down or rash.   PE  BP 140/92 mmHg  Pulse 99  Resp 16  Ht 5\' 7"  (1.702 m)  Wt 316 lb 12.8 oz (143.7 kg)  BMI 49.61 kg/m2  SpO2 97%  LMP 03/16/2015  Patient alert and oriented and in no cardiopulmonary distress.Taerful at times as she discusses her mental health issues  HEENT: No facial asymmetry, EOMI,   oropharynx pink and moist.  Neck supple no JVD, no mass.  Chest: Clear to auscultation bilaterally.  CVS: S1, S2 no murmurs, no S3.Regular rate.  ABD: Soft non tender.   Ext: No edema  MS: Adequate ROM spine, shoulders, hips and knees.  Skin: Intact, no ulcerations or rash noted.  Psych: Good eye contact, tearful affect. Memory intact both anxious and  depressed appearing.  CNS: CN 2-12 intact, power,  normal throughout.no focal deficits  noted.   Assessment & Plan   Depression with anxiety Increased and uncontrolled symptoms, not suicidal or homicidal. Increase in effexor dose , and also of buspar and referral for therapy  Morbid obesity Deteriorated. Patient re-educated about  the importance of commitment to a  minimum of 150 minutes of exercise per week.  The importance of healthy food choices with portion control discussed. Encouraged to start a food diary, count calories and to consider  joining a support group. Sample diet sheets offered. Goals set by the patient for the next several months.   Weight /BMI 04/03/2015 03/26/2015 12/18/2014  WEIGHT 316 lb 12.8 oz 315 lb 6.4 oz 311 lb  HEIGHT 5\' 7"  5\' 7"  5\' 7"   BMI 49.61 kg/m2 49.39 kg/m2 48.7 kg/m2    Current exercise per week 60 minutes.   Vaginitis and vulvovaginitis Symptomatic, urine specimens sent for testing, will treat based on report  Prediabetes Patient educated about the importance of limiting  Carbohydrate intake , the need to commit to daily physical activity for a minimum of 30 minutes , and to commit weight loss. The fact that changes in all these areas will reduce or eliminate all together the development of diabetes is stressed.  Improved , updated lab needed, now on metformin  Diabetic Labs Latest Ref Rng 03/26/2015 12/17/2014 07/10/2014 11/29/2013 02/12/2013  HbA1c <5.7 % - 6.0(H) 6.4(H) 6.0(H) 6.0(H)  Chol 0 - 200 mg/dL - - 782 - 956  HDL >21 mg/dL - - 54 - 45  Calc LDL 0 - 99 mg/dL - -  77 - 54  Triglycerides <150 mg/dL - - 46 - 45  Creatinine 0.50 - 1.10 mg/dL 1.30 8.65 7.84 - 6.96   BP/Weight 04/03/2015 03/26/2015 12/18/2014 07/10/2014 03/13/2014 02/27/2014 02/14/2014  Systolic BP 140 142 120 120 113 120 120  Diastolic BP 92 91 82 82 82 80 82  Wt. (Lbs) 316.8 315.4 311 306 308 308.4 307.4  BMI 49.61 49.39 48.7 47.92 48.23 48.29 48.13   No flowsheet data found.     FATIGUE Multifactorial, lack of exercise, morbifd obesiy , depression and  possible sleep apnea all contribute, sleep study recommended , wants to hold off currently  Alopecia (capitis) totalis unchanged

## 2015-06-07 NOTE — Assessment & Plan Note (Addendum)
Increased and uncontrolled symptoms, not suicidal or homicidal. Increase in effexor dose , and also of buspar and referral for therapy

## 2015-06-07 NOTE — Assessment & Plan Note (Signed)
Patient educated about the importance of limiting  Carbohydrate intake , the need to commit to daily physical activity for a minimum of 30 minutes , and to commit weight loss. The fact that changes in all these areas will reduce or eliminate all together the development of diabetes is stressed.  Improved , updated lab needed, now on metformin  Diabetic Labs Latest Ref Rng 03/26/2015 12/17/2014 07/10/2014 11/29/2013 02/12/2013  HbA1c <5.7 % - 6.0(H) 6.4(H) 6.0(H) 6.0(H)  Chol 0 - 200 mg/dL - - 140 - 108  HDL >39 mg/dL - - 54 - 45  Calc LDL 0 - 99 mg/dL - - 77 - 54  Triglycerides <150 mg/dL - - 46 - 45  Creatinine 0.50 - 1.10 mg/dL 0.65 0.66 0.73 - 0.62   BP/Weight 04/03/2015 03/26/2015 12/18/2014 07/10/2014 03/13/2014 08/11/6393 01/15/36  Systolic BP 944 461 901 222 411 464 314  Diastolic BP 92 91 82 82 82 80 82  Wt. (Lbs) 316.8 315.4 311 306 308 308.4 307.4  BMI 49.61 49.39 48.7 47.92 48.23 48.29 48.13   No flowsheet data found.

## 2015-06-26 ENCOUNTER — Ambulatory Visit: Payer: 59 | Admitting: Nurse Practitioner

## 2015-07-07 ENCOUNTER — Telehealth: Payer: Self-pay | Admitting: *Deleted

## 2015-07-07 NOTE — Telephone Encounter (Signed)
Pt requesting lab order fax to [facility]

## 2015-07-07 NOTE — Telephone Encounter (Signed)
Noted and labs faxed to [facility]

## 2015-07-08 ENCOUNTER — Ambulatory Visit: Payer: 59 | Admitting: Family Medicine

## 2015-07-15 ENCOUNTER — Ambulatory Visit: Payer: 59 | Admitting: Nurse Practitioner

## 2015-08-01 ENCOUNTER — Telehealth: Payer: Self-pay | Admitting: Gastroenterology

## 2015-08-01 ENCOUNTER — Ambulatory Visit: Payer: 59 | Admitting: Gastroenterology

## 2015-08-01 NOTE — Telephone Encounter (Signed)
Pt was a no show

## 2015-08-13 ENCOUNTER — Ambulatory Visit: Payer: 59 | Admitting: Family Medicine

## 2015-09-30 ENCOUNTER — Telehealth: Payer: Self-pay | Admitting: Family Medicine

## 2015-09-30 ENCOUNTER — Inpatient Hospital Stay (HOSPITAL_COMMUNITY)
Admission: EM | Admit: 2015-09-30 | Discharge: 2015-10-03 | DRG: 392 | Disposition: A | Discharge status: Home / Self Care | Payer: 59 | Attending: Internal Medicine | Admitting: Internal Medicine

## 2015-09-30 ENCOUNTER — Encounter (HOSPITAL_COMMUNITY): Payer: Self-pay | Admitting: Emergency Medicine

## 2015-09-30 ENCOUNTER — Emergency Department (HOSPITAL_COMMUNITY): Payer: 59

## 2015-09-30 DIAGNOSIS — Z8249 Family history of ischemic heart disease and other diseases of the circulatory system: Secondary | ICD-10-CM

## 2015-09-30 DIAGNOSIS — Z6841 Body Mass Index (BMI) 40.0 and over, adult: Secondary | ICD-10-CM

## 2015-09-30 DIAGNOSIS — Z833 Family history of diabetes mellitus: Secondary | ICD-10-CM

## 2015-09-30 DIAGNOSIS — F411 Generalized anxiety disorder: Secondary | ICD-10-CM | POA: Diagnosis not present

## 2015-09-30 DIAGNOSIS — K5720 Diverticulitis of large intestine with perforation and abscess without bleeding: Secondary | ICD-10-CM | POA: Diagnosis not present

## 2015-09-30 DIAGNOSIS — R7303 Prediabetes: Secondary | ICD-10-CM | POA: Diagnosis present

## 2015-09-30 DIAGNOSIS — E119 Type 2 diabetes mellitus without complications: Secondary | ICD-10-CM | POA: Diagnosis present

## 2015-09-30 DIAGNOSIS — E661 Drug-induced obesity: Secondary | ICD-10-CM | POA: Diagnosis not present

## 2015-09-30 DIAGNOSIS — E876 Hypokalemia: Secondary | ICD-10-CM | POA: Diagnosis present

## 2015-09-30 DIAGNOSIS — D649 Anemia, unspecified: Secondary | ICD-10-CM | POA: Diagnosis present

## 2015-09-30 DIAGNOSIS — K567 Ileus, unspecified: Secondary | ICD-10-CM | POA: Diagnosis present

## 2015-09-30 DIAGNOSIS — K5732 Diverticulitis of large intestine without perforation or abscess without bleeding: Secondary | ICD-10-CM | POA: Diagnosis present

## 2015-09-30 DIAGNOSIS — B9789 Other viral agents as the cause of diseases classified elsewhere: Secondary | ICD-10-CM | POA: Diagnosis present

## 2015-09-30 DIAGNOSIS — F410 Panic disorder [episodic paroxysmal anxiety] without agoraphobia: Secondary | ICD-10-CM | POA: Diagnosis present

## 2015-09-30 DIAGNOSIS — R51 Headache: Secondary | ICD-10-CM | POA: Diagnosis present

## 2015-09-30 DIAGNOSIS — Z7984 Long term (current) use of oral hypoglycemic drugs: Secondary | ICD-10-CM

## 2015-09-30 DIAGNOSIS — K668 Other specified disorders of peritoneum: Secondary | ICD-10-CM | POA: Diagnosis present

## 2015-09-30 DIAGNOSIS — F329 Major depressive disorder, single episode, unspecified: Secondary | ICD-10-CM | POA: Diagnosis present

## 2015-09-30 LAB — COMPREHENSIVE METABOLIC PANEL
ALK PHOS: 121 U/L (ref 38–126)
ALT: 29 U/L (ref 14–54)
AST: 31 U/L (ref 15–41)
Albumin: 4.1 g/dL (ref 3.5–5.0)
Anion gap: 11 (ref 5–15)
BILIRUBIN TOTAL: 0.7 mg/dL (ref 0.3–1.2)
BUN: 6 mg/dL (ref 6–20)
CALCIUM: 8.9 mg/dL (ref 8.9–10.3)
CO2: 27 mmol/L (ref 22–32)
CREATININE: 0.8 mg/dL (ref 0.44–1.00)
Chloride: 100 mmol/L — ABNORMAL LOW (ref 101–111)
GFR calc Af Amer: >60 mL/min (ref >60)
GFR calc non Af Amer: >60 mL/min (ref >60)
Glucose, Bld: 115 mg/dL — ABNORMAL HIGH (ref 65–99)
Potassium: 3.3 mmol/L — ABNORMAL LOW (ref 3.5–5.1)
Sodium: 138 mmol/L (ref 135–145)
TOTAL PROTEIN: 8.4 g/dL — AB (ref 6.5–8.1)

## 2015-09-30 LAB — CBC WITH DIFFERENTIAL/PLATELET
Basophils Absolute: 0 10*3/uL (ref 0.0–0.1)
Basophils Relative: 0 %
Eosinophils Absolute: 0 10*3/uL (ref 0.0–0.7)
Eosinophils Relative: 0 %
HCT: 42.4 % (ref 36.0–46.0)
HEMOGLOBIN: 14.4 g/dL (ref 12.0–15.0)
LYMPHS ABS: 1.5 10*3/uL (ref 0.7–4.0)
LYMPHS PCT: 11 %
MCH: 30.8 pg (ref 26.0–34.0)
MCHC: 34 g/dL (ref 30.0–36.0)
MCV: 90.6 fL (ref 78.0–100.0)
Monocytes Absolute: 0.5 10*3/uL (ref 0.1–1.0)
Monocytes Relative: 4 %
NEUTROS PCT: 85 %
Neutro Abs: 12.1 10*3/uL — ABNORMAL HIGH (ref 1.7–7.7)
Platelets: 344 10*3/uL (ref 150–400)
RBC: 4.68 MIL/uL (ref 3.87–5.11)
RDW: 13.5 % (ref 11.5–15.5)
WBC: 14.1 10*3/uL — AB (ref 4.0–10.5)

## 2015-09-30 LAB — URINALYSIS, ROUTINE W REFLEX MICROSCOPIC
GLUCOSE, UA: NEGATIVE mg/dL
KETONES UR: NEGATIVE mg/dL
Nitrite: NEGATIVE
PH: 5.5 (ref 5.0–8.0)
Protein, ur: 100 mg/dL — AB
Specific Gravity, Urine: 1.025 (ref 1.005–1.030)

## 2015-09-30 LAB — LIPASE, BLOOD: Lipase: 21 U/L (ref 11–51)

## 2015-09-30 LAB — PREGNANCY, URINE: Preg Test, Ur: NEGATIVE

## 2015-09-30 LAB — URINE MICROSCOPIC-ADD ON

## 2015-09-30 LAB — AMYLASE: Amylase: 32 U/L (ref 28–100)

## 2015-09-30 IMAGING — CT CT ABD-PELV W/ CM
2 of 8 series · 14 of 46 positions shown, 18 images · IV contrast (Omnipaque 300)
Comparison: None.

CLINICAL DATA: Lower abdominal pain and diarrhea 4 days.

EXAM:
CT ABDOMEN AND PELVIS WITH CONTRAST
TECHNIQUE: Multidetector CT imaging of the abdomen and pelvis was performed
using the standard protocol following bolus administration of
intravenous contrast.
CONTRAST:  125mL OMNIPAQUE IOHEXOL 300 MG/ML  SOLN

[Series 2: abd_pel_with 5.0 b40s · axial · 0.97mm/px · z∈[-465,-15]mm · 11 of 102 slices shown, 15 images]
[im 6/102  soft-tissue]
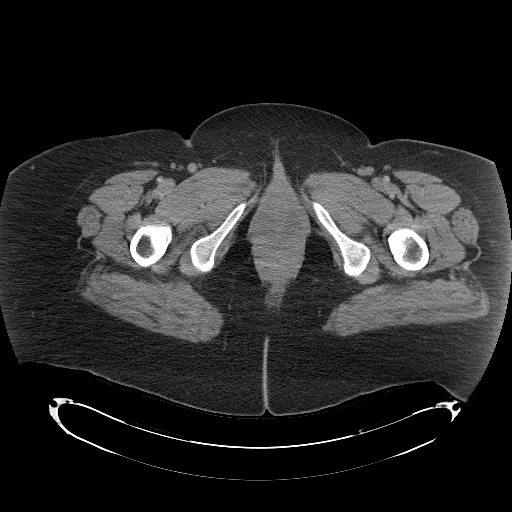
[im 6/102  bone]
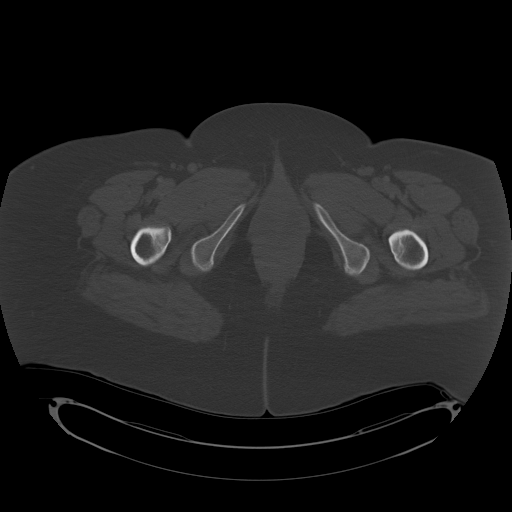
[im 18/102  soft-tissue]
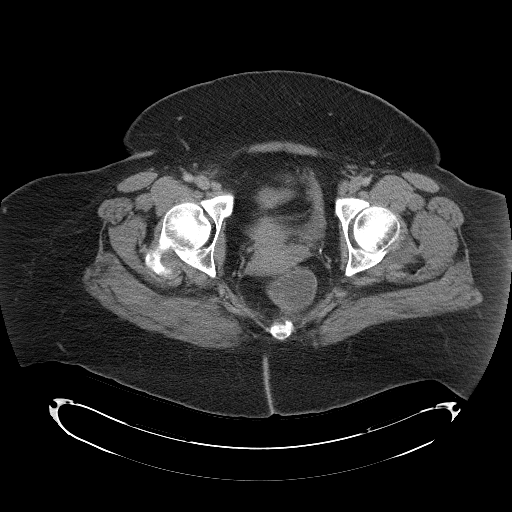
[im 30/102  soft-tissue]
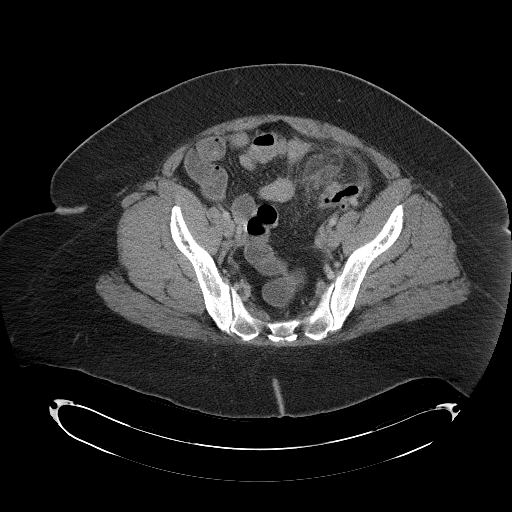
[im 42/102  soft-tissue]
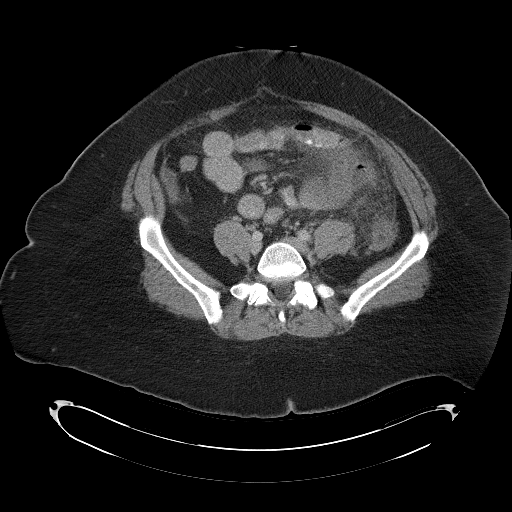
[im 54/102  soft-tissue]
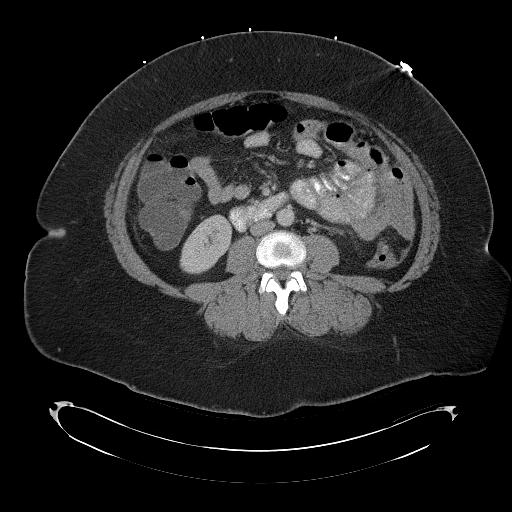
[im 60/102  soft-tissue]
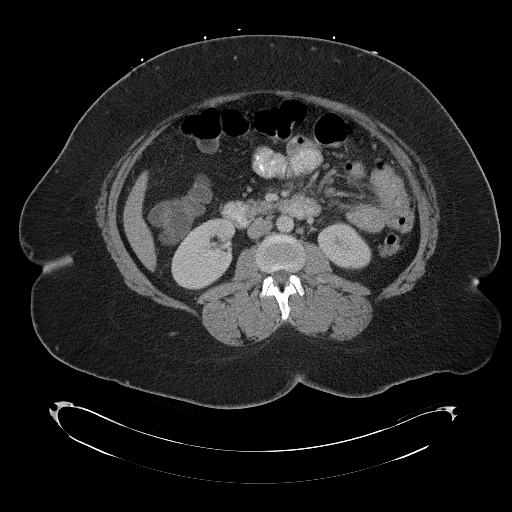
[im 72/102  soft-tissue]
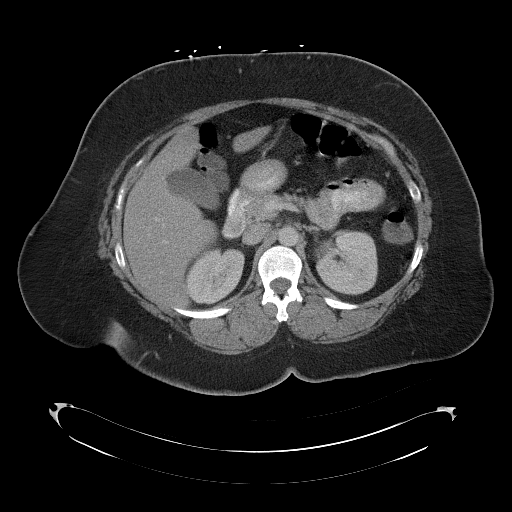
[im 78/102  lung]
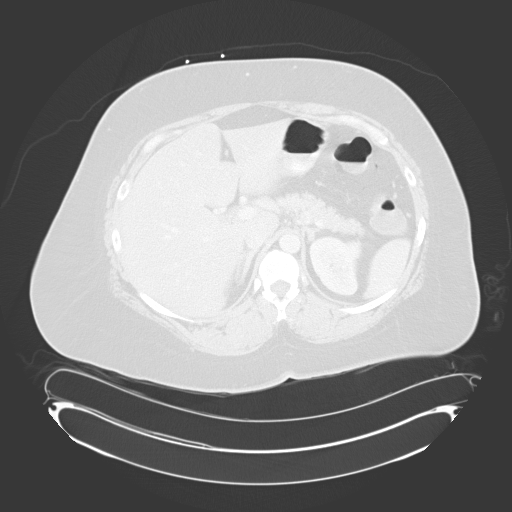
[im 84/102  soft-tissue]
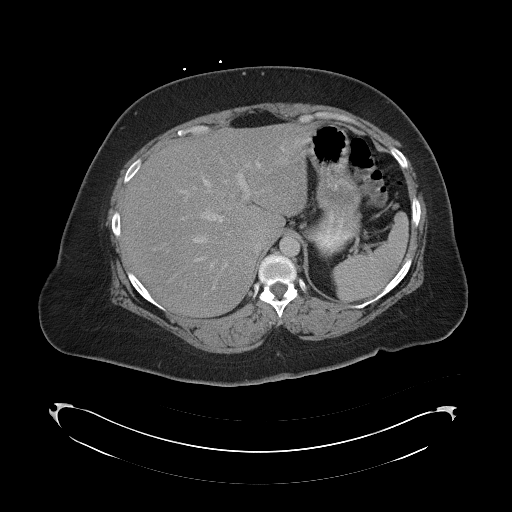
[im 84/102  lung]
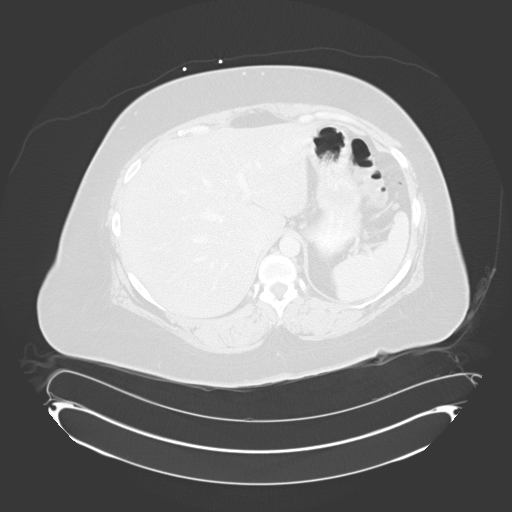
[im 90/102  lung]
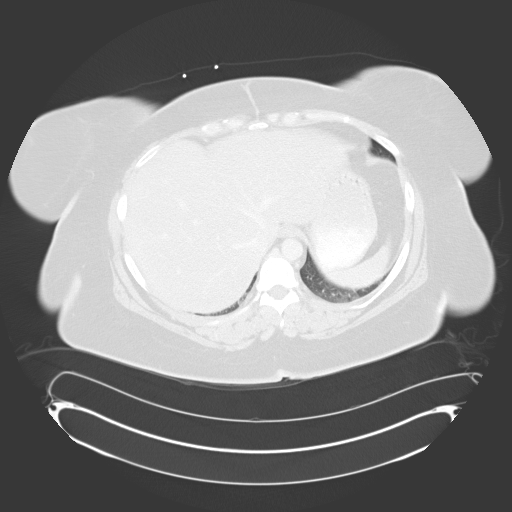
[im 96/102  soft-tissue]
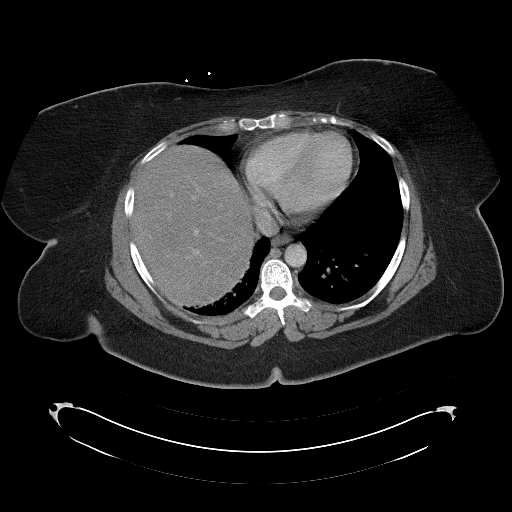
[im 96/102  lung]
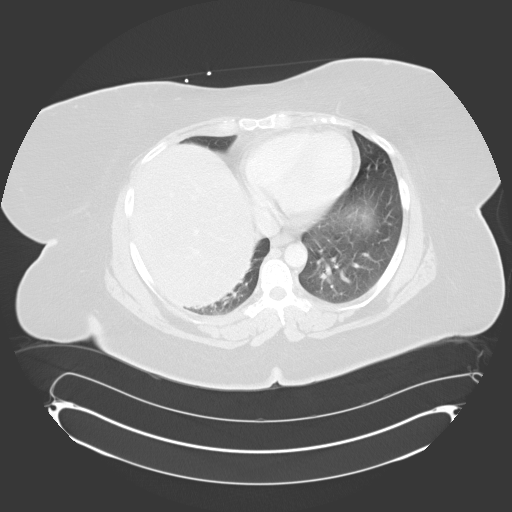
[im 96/102  bone]
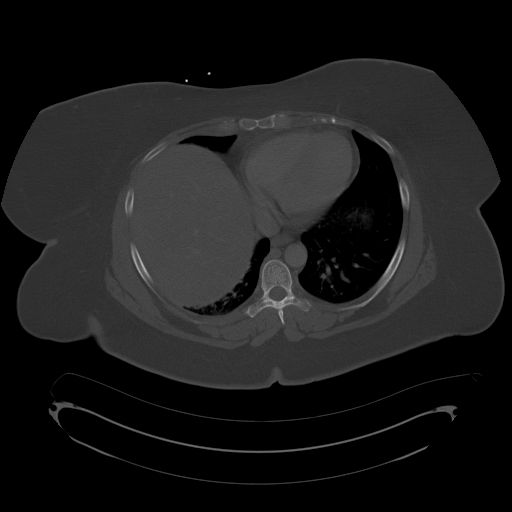

[Series 6: mpr cor post contrast 3.0mm · coronal · 1.00mm/px · 3 of 107 slices shown]
[im 27/107  soft-tissue]
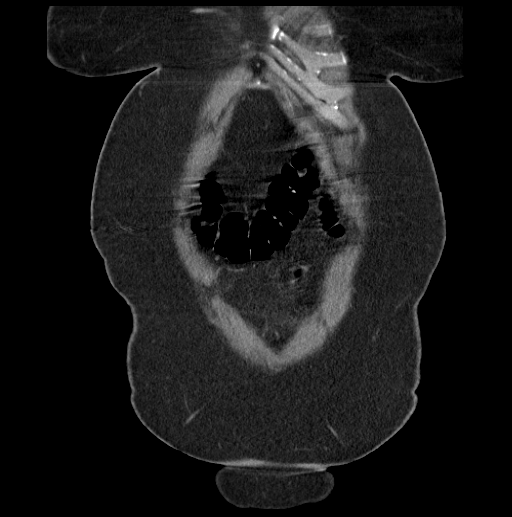
[im 54/107  soft-tissue]
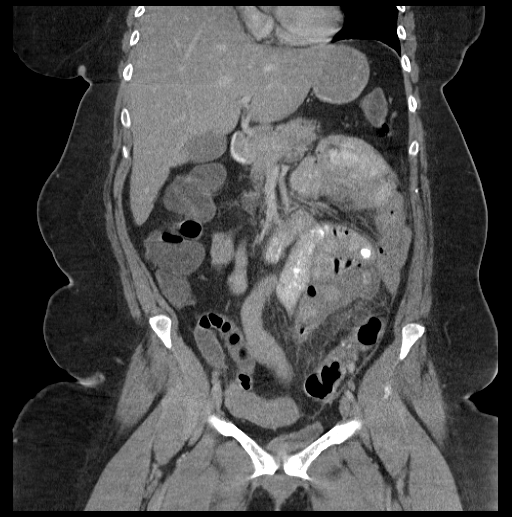
[im 80/107  soft-tissue]
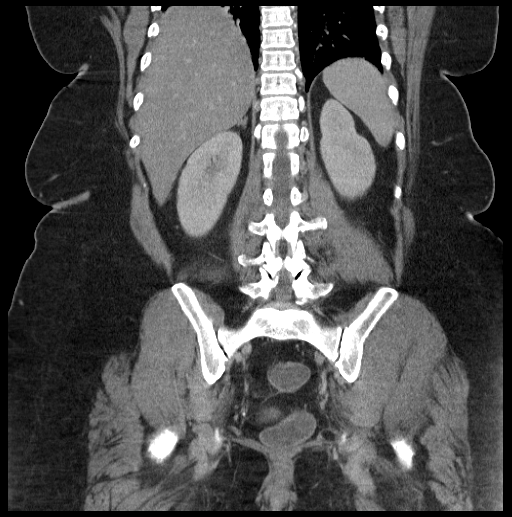

[14 of 46 positions shown; findings below may reference images not displayed]

FINDINGS: Lung bases demonstrate linear atelectasis over the right base.

Abdominal images demonstrate mild diffuse low-attenuation of the
liver without focal mass. The spleen, pancreas, gallbladder and
adrenal glands are within normal. Stomach is normal. Kidneys an
ureters are within normal.

Surgical absence of the appendix. Vascular structures are within
normal.

There is diverticulosis of the colon with inflammatory change
adjacent the distal descending and sigmoid colon. This likely
represents acute diverticulitis with perforation. There is a
collection of extraluminal air medially anterior to the inflamed
diverticula of the sigmoid colon. This may be a developing
diverticular abscess. There are a few small foci of free peritoneal
air higher within the left abdomen. There is significant wall
thickening of several adjacent small bowel loops in the left mid
abdomen slightly higher than the diverticular disease. Possible mild
pneumatosis of several of these thick walled small bowel loops.
Minimal dilatation of several small bowel loops proximal to this
region. These findings are likely due to secondary inflammatory
change and ileus. There is patchy mesenteric fluid. No mesenteric
venous gas. A primary small bowel inflammatory process is possible
but less likely.

Pelvic images demonstrate IUD in adequate position. Small uterine
calcified fibroid. Ovaries, bladder and rectum are normal. Remaining
bones and soft tissues are within normal.
IMPRESSION: Inflammatory process in the left mid to lower abdomen likely due to
an acute diverticulitis of the sigmoid colon with perforation.
Resulting peritonitis with secondary inflammation of several
adjacent small bowel loops and focal ileus. Possible developing
diverticular abscess immediately anterior to the inflamed sigmoid
colon.

Right basilar atelectasis.

Critical Value/emergent results were called by telephone at the time
of interpretation on [DATE] at [DATE] to Dr. KLEVER ,
who verbally acknowledged these results.

## 2015-09-30 MED ORDER — ACETAMINOPHEN 500 MG PO TABS
ORAL_TABLET | ORAL | Status: AC
  Administered 2015-09-30: 1000 mg via ORAL
  Filled 2015-09-30: qty 2

## 2015-09-30 MED ORDER — METRONIDAZOLE IN NACL 5-0.79 MG/ML-% IV SOLN
500.0000 mg | Freq: Once | INTRAVENOUS | Status: DC
  Filled 2015-09-30: qty 100

## 2015-09-30 MED ORDER — IOHEXOL 300 MG/ML  SOLN
125.0000 mL | Freq: Once | INTRAMUSCULAR | Status: AC | PRN
  Administered 2015-09-30: 125 mL via INTRAVENOUS

## 2015-09-30 MED ORDER — FENTANYL CITRATE (PF) 100 MCG/2ML IJ SOLN
50.0000 ug | Freq: Once | INTRAMUSCULAR | Status: AC
  Administered 2015-09-30: 50 ug via INTRAVENOUS
  Filled 2015-09-30: qty 2

## 2015-09-30 MED ORDER — CIPROFLOXACIN IN D5W 400 MG/200ML IV SOLN
400.0000 mg | Freq: Once | INTRAVENOUS | Status: AC
  Administered 2015-09-30: 400 mg via INTRAVENOUS
  Filled 2015-09-30: qty 200

## 2015-09-30 MED ORDER — ACETAMINOPHEN 500 MG PO TABS
1000.0000 mg | ORAL_TABLET | Freq: Once | ORAL | Status: AC
  Administered 2015-09-30: 1000 mg via ORAL

## 2015-09-30 MED ORDER — SODIUM CHLORIDE 0.9 % IV SOLN
INTRAVENOUS | Status: DC
  Administered 2015-09-30: 20:00:00 via INTRAVENOUS

## 2015-09-30 MED ORDER — ONDANSETRON HCL 4 MG/2ML IJ SOLN
4.0000 mg | Freq: Once | INTRAMUSCULAR | Status: AC
  Administered 2015-09-30: 4 mg via INTRAVENOUS
  Filled 2015-09-30: qty 2

## 2015-09-30 MED ORDER — SODIUM CHLORIDE 0.9 % IV BOLUS (SEPSIS)
500.0000 mL | Freq: Once | INTRAVENOUS | Status: AC
  Administered 2015-09-30: 500 mL via INTRAVENOUS

## 2015-09-30 MED ORDER — POTASSIUM CHLORIDE CRYS ER 20 MEQ PO TBCR
40.0000 meq | EXTENDED_RELEASE_TABLET | Freq: Every day | ORAL | Status: DC
  Administered 2015-10-01 (×2): 40 meq via ORAL
  Filled 2015-09-30 (×2): qty 2

## 2015-09-30 NOTE — Telephone Encounter (Signed)
Patient is stating that she is having stomach pain, diarrhea, please advise?

## 2015-09-30 NOTE — H&P (Signed)
[facility] History and Physical  [name] QIO:[id number] DOB: [birth year]    PCP:   Syliva Overman, MD   Chief Complaint: abdominal pain for three days.   HPI: Jennifer [name] is an 39 y.o. female with multiple medical problems, including morbid obesity, recurrent UTIs, depression, alopecia, borderline DM on Metformin, hx of diverticulosis with pior diverticulitis, presented to the ER  With 3 days hx of right lower quadrant pain.  Evalatuion in the ER included an  Abdominal pelvic CT which showed diverticulitis with perforation and possible early peritonitis and early abscess formation.  She has a leukocytosis with WBC of 14K, and normal renal fx test.  Her K was low at 3.3 mEq/L, and her pregnancy test was negative.  She never had a colonoscopy.   Rewiew of Systems:  Constitutional: Negative for malaise, fever and chills. No significant weight loss or weight gain Eyes: Negative for eye pain, redness and discharge, diplopia, visual changes, or flashes of light. ENMT: Negative for ear pain, hoarseness, nasal congestion, sinus pressure and sore throat. No headaches; tinnitus, drooling, or problem swallowing. Cardiovascular: Negative for chest pain, palpitations, diaphoresis, dyspnea and peripheral edema. ; No orthopnea, PND Respiratory: Negative for cough, hemoptysis, wheezing and stridor. No pleuritic chestpain. Gastrointestinal: Negative for nausea, vomiting, diarrhea, constipation, melena, blood in stool, hematemesis, jaundice and rectal bleeding.    Genitourinary: Negative for frequency, dysuria, incontinence,flank pain and hematuria; Musculoskeletal: Negative for back pain and neck pain. Negative for swelling and trauma.;  Skin: . Negative for pruritus, rash, abrasions, bruising and skin lesion.; ulcerations Neuro: Negative for headache, lightheadedness and neck stiffness. Negative for weakness, altered level of consciousness , altered mental status, extremity weakness, burning  feet, involuntary movement, seizure and syncope.  Psych: negative for anxiety, depression, insomnia, tearfulness, panic attacks, hallucinations, paranoia, suicidal or homicidal ideation   Past Medical History  Diagnosis Date  . Family history of diabetes mellitus   . History of recurrent UTIs   . Obesity   . Depression   . Alopecia   . Abnormal vaginal Pap smear   . Diabetes mellitus without complication (HCC)   . Diverticulosis   . Hemorrhoids     Past Surgical History  Procedure Laterality Date  . Appendectomy    . Knee arthroscopy      right  . Colonoscopy N/A 03/13/2014    Dr. Jena Gauss- grade 3 hemorrhoids, colonic diverticulosis bx= benign lymphoid polyp    Medications:  HOME MEDS: Prior to Admission medications   Medication Sig Start Date End Date Taking? Authorizing Provider  acetaminophen (TYLENOL) 500 MG tablet Take 500 mg by mouth every 6 (six) hours as needed for mild pain or moderate pain.   Yes Historical Provider, MD  busPIRone (BUSPAR) 7.5 MG tablet Take 1 tablet (7.5 mg total) by mouth 3 (three) times daily. 04/03/15  Yes [name], MD  metFORMIN (GLUCOPHAGE) 500 MG tablet TAKE 1 TABLET (500 MG TOTAL) BY MOUTH 3 (THREE) TIMES DAILY. 04/16/15  Yes [name], MD  venlafaxine XR (EFFEXOR-XR) 75 MG 24 hr capsule Take 1 capsule (75 mg total) by mouth daily with breakfast. 04/03/15  Yes [name], MD     Allergies:  No Known Allergies  Social History:   reports that she has never smoked. She has never used smokeless tobacco. She reports that she does not drink alcohol or use illicit drugs.  Family History: Family History  Problem Relation Age of Onset  . Adopted: Yes  . Hypertension Mother   .  Diabetes Mother   . Other Mother     cholangiocarcinoma, age 22, deceased  . Colon cancer Neg Hx      Physical Exam: Filed Vitals:   09/30/15 2149 09/30/15 2200 09/30/15 2230 09/30/15 2300  BP:  109/70 106/74 104/85  Pulse:  115 113 115   Temp: 98.6 F (37 C)     TempSrc: Oral     Resp:  17 26 12   Height:      Weight:      SpO2:  96% 96% 97%   Blood pressure 104/85, pulse 115, temperature 98.6 F (37 C), temperature source Oral, resp. rate 12, height 5\' 7"  (1.702 m), weight 142.883 kg (315 lb), SpO2 97 %.  GEN:  Pleasant  patient lying in the stretcher in no acute distress; cooperative with exam. PSYCH:  alert and oriented x4; does not appear anxious or depressed; affect is appropriate. HEENT: Mucous membranes pink and anicteric; PERRLA; EOM intact; no cervical lymphadenopathy nor thyromegaly or carotid bruit; no JVD; There were no stridor. Neck is very supple. Breasts:: Not examined CHEST WALL: No tenderness CHEST: Normal respiration, clear to auscultation bilaterally.  HEART: Regular rate and rhythm.  There are no murmur, rub, or gallops.   BACK: No kyphosis or scoliosis; no CVA tenderness ABDOMEN: soft and tender on the LLQ, no masses, no organomegaly, normal abdominal bowel sounds; no pannus; no intertriginous candida. There is no rebound and no distention. Rectal Exam: Not done EXTREMITIES: No bone or joint deformity; age-appropriate arthropathy of the hands and knees; no edema; no ulcerations.  There is no calf tenderness. Genitalia: not examined PULSES: 2+ and symmetric SKIN: Normal hydration no rash or ulceration CNS: Cranial nerves 2-12 grossly intact no focal lateralizing neurologic deficit.  Speech is fluent; uvula elevated with phonation, facial symmetry and tongue midline. DTR are normal bilaterally, cerebella exam is intact, barbinski is negative and strengths are equaled bilaterally.  No sensory loss.   Labs on Admission:  Basic Metabolic Panel:  Recent Labs Lab 09/30/15 1631  NA 138  K 3.3*  CL 100*  CO2 27  GLUCOSE 115*  BUN 6  CREATININE 0.80  CALCIUM 8.9   Liver Function Tests:  Recent Labs Lab 09/30/15 1631  AST 31  ALT 29  ALKPHOS 121  BILITOT 0.7  PROT 8.4*  ALBUMIN 4.1     Recent Labs Lab 09/30/15 1631  LIPASE 21  AMYLASE 32   CBC:  Recent Labs Lab 09/30/15 1631  WBC 14.1*  NEUTROABS 12.1*  HGB 14.4  HCT 42.4  MCV 90.6  PLT 344    Radiological Exams on Admission: Ct Abdomen Pelvis W Contrast  09/30/2015  CLINICAL DATA:  Lower abdominal pain and diarrhea 4 days. EXAM: CT ABDOMEN AND PELVIS WITH CONTRAST TECHNIQUE: Multidetector CT imaging of the abdomen and pelvis was performed using the standard protocol following bolus administration of intravenous contrast. CONTRAST:  OMNIPAQUE IOHEXOL 300 MG/ML  SOLN COMPARISON:  None. FINDINGS: Lung bases demonstrate linear atelectasis over the right base. Abdominal images demonstrate mild diffuse low-attenuation of the liver without focal mass. The spleen, pancreas, gallbladder and adrenal glands are within normal. Stomach is normal. Kidneys an ureters are within normal. Surgical absence of the appendix. Vascular structures are within normal. There is diverticulosis of the colon with inflammatory change adjacent the distal descending and sigmoid colon. This likely represents acute diverticulitis with perforation. There is a collection of extraluminal air medially anterior to the inflamed diverticula of the sigmoid colon. This may  be a developing diverticular abscess. There are a few small foci of free peritoneal air higher within the left abdomen. There is significant wall thickening of several adjacent small bowel loops in the left mid abdomen slightly higher than the diverticular disease. Possible mild pneumatosis of several of these thick walled small bowel loops. Minimal dilatation of several small bowel loops proximal to this region. These findings are likely due to secondary inflammatory change and ileus. There is patchy mesenteric fluid. No mesenteric venous gas. A primary small bowel inflammatory process is possible but less likely. Pelvic images demonstrate IUD in adequate position. Small uterine  calcified fibroid. Ovaries, bladder and rectum are normal. Remaining bones and soft tissues are within normal. IMPRESSION: Inflammatory process in the left mid to lower abdomen likely due to an acute diverticulitis of the sigmoid colon with perforation. Resulting peritonitis with secondary inflammation of several adjacent small bowel loops and focal ileus. Possible developing diverticular abscess immediately anterior to the inflamed sigmoid colon. Right basilar atelectasis. Critical Value/emergent results were called by telephone at the time of interpretation on 09/30/2015 at 9:58 pm to Dr. Vanetta Mulders , who verbally acknowledged these results. Electronically Signed   By: Elberta Fortis M.D.   On: 09/30/2015 21:58    Assessment/Plan Present on Admission:  . Prediabetes . Morbid obesity (HCC) . GAD (generalized anxiety disorder) . Diverticulitis large intestine  PLAN:  Will admit her for diverticulitis with perforation and early abscess formation.  She appears well at this time.  Surgery has been consulted by EDP.  Will make her NPO, give IVF and replete K orally.  She will be given pain meds, along with IV Flagyl and IV Cipro.  She will need a follow up colonoscopy and consider surgery in the future to prevent recurrence.  She is stable, full code, and will be admitted to [facility] service.  Thanks.   Other plans as per orders.  Code Status: FULL Unk Lightning, MD. [facility] Pager [phone] 7pm to 7am.  09/30/2015, 11:28 PM

## 2015-09-30 NOTE — Telephone Encounter (Signed)
Attempted to reach patient.   Number is a fax.  Will try again.

## 2015-09-30 NOTE — ED Provider Notes (Signed)
CSN: [id number]     Arrival date & time 09/30/15  1600 History   First MD Initiated Contact with Patient 09/30/15 1857     Chief Complaint  Patient presents with  . Abdominal Pain     (Consider location/radiation/quality/duration/timing/severity/associated sxs/prior Treatment) Patient is a 39 y.o. female presenting with abdominal pain. The history is provided by the patient.  Abdominal Pain Associated symptoms: diarrhea and fever   Associated symptoms: no chest pain, no dysuria, no nausea, no shortness of breath and no vomiting    patient with onset of abdominal pain lower quadrants left greater than right starting on Saturday. No nausea no vomiting but associated with loose bowel movements. No blood in them. Also associated with fever. Patient states the pain is 10 out of 10. No history of any similar pain. Patient has had a colonoscopy in the past and was told she has diverticulosis.  Past Medical History  Diagnosis Date  . Family history of diabetes mellitus   . History of recurrent UTIs   . Obesity   . Depression   . Alopecia   . Abnormal vaginal Pap smear   . Diabetes mellitus without complication ([place])   . Diverticulosis   . Hemorrhoids    Past Surgical History  Procedure Laterality Date  . Appendectomy    . Knee arthroscopy      right  . Colonoscopy N/A 03/13/2014    Dr. Gala Romney- grade 3 hemorrhoids, colonic diverticulosis bx= benign lymphoid polyp   Family History  Problem Relation Age of Onset  . Adopted: Yes  . Hypertension Mother   . Diabetes Mother   . Other Mother     cholangiocarcinoma, age 68, deceased  . Colon cancer Neg Hx    Social History  Substance Use Topics  . Smoking status: Never Smoker   . Smokeless tobacco: Never Used  . Alcohol Use: No   OB History    Gravida Para Term Preterm AB TAB SAB Ectopic Multiple Living   1 1 1       1      Review of Systems  Constitutional: Positive for fever.  HENT: Negative for congestion.   Eyes: Negative  for redness.  Respiratory: Negative for shortness of breath.   Cardiovascular: Negative for chest pain.  Gastrointestinal: Positive for abdominal pain and diarrhea. Negative for nausea and vomiting.  Genitourinary: Negative for dysuria.  Musculoskeletal: Negative for back pain.  Skin: Negative for rash.  Neurological: Positive for numbness.  Hematological: Does not bruise/bleed easily.  Psychiatric/Behavioral: Negative for confusion.      Allergies  Review of patient's allergies indicates no known allergies.  Home Medications   Prior to Admission medications   Medication Sig Start Date End Date Taking? Authorizing Provider  acetaminophen (TYLENOL) 500 MG tablet Take 500 mg by mouth every 6 (six) hours as needed for mild pain or moderate pain.   Yes Historical Provider, MD  busPIRone (BUSPAR) 7.5 MG tablet Take 1 tablet (7.5 mg total) by mouth 3 (three) times daily. 04/03/15  Yes [name], MD  metFORMIN (GLUCOPHAGE) 500 MG tablet TAKE 1 TABLET (500 MG TOTAL) BY MOUTH 3 (THREE) TIMES DAILY. 04/16/15  Yes [name], MD  venlafaxine XR (EFFEXOR-XR) 75 MG 24 hr capsule Take 1 capsule (75 mg total) by mouth daily with breakfast. 04/03/15  Yes [name], MD   BP 109/70 mmHg  Pulse 115  Temp(Src) 98.6 F (37 C) (Oral)  Resp 17  Ht 5\' 7"  (1.702 m)  Wt 315 lb (142.883 kg)  BMI 49.32 kg/m2  SpO2 96% Physical Exam  Constitutional: She is oriented to person, place, and time. She appears well-developed and well-nourished. No distress.  HENT:  Head: Normocephalic and atraumatic.  Mouth/Throat: Oropharynx is clear and moist.  Eyes: Conjunctivae and EOM are normal. Pupils are equal, round, and reactive to light.  Neck: Normal range of motion.  Cardiovascular: Normal rate, regular rhythm and normal heart sounds.   No murmur heard. Pulmonary/Chest: Effort normal and breath sounds normal. No respiratory distress.  Abdominal: Soft. Bowel sounds are normal. There is  tenderness.  Tenderness to palpation lower quadrants left side greater than right.  Musculoskeletal: Normal range of motion.  Neurological: She is alert and oriented to person, place, and time. No cranial nerve deficit. Coordination normal.  Skin: Skin is warm. No rash noted.  Nursing note and vitals reviewed.   ED Course  Procedures (including critical care time) Labs Review Labs Reviewed  CBC WITH DIFFERENTIAL/PLATELET - Abnormal; Notable for the following:    WBC 14.1 (*)    Neutro Abs 12.1 (*)    All other components within normal limits  COMPREHENSIVE METABOLIC PANEL - Abnormal; Notable for the following:    Potassium 3.3 (*)    Chloride 100 (*)    Glucose, Bld 115 (*)    Total Protein 8.4 (*)    All other components within normal limits  URINALYSIS, ROUTINE W REFLEX MICROSCOPIC (NOT AT [facility]) - Abnormal; Notable for the following:    Hgb urine dipstick LARGE (*)    Bilirubin Urine MODERATE (*)    Protein, ur 100 (*)    Leukocytes, UA TRACE (*)    All other components within normal limits  URINE MICROSCOPIC-ADD ON - Abnormal; Notable for the following:    Squamous Epithelial / LPF 6-30 (*)    Bacteria, UA MANY (*)    All other components within normal limits  AMYLASE  LIPASE, BLOOD  PREGNANCY, URINE    Imaging Review Ct Abdomen Pelvis W Contrast  09/30/2015  CLINICAL DATA:  Lower abdominal pain and diarrhea 4 days. EXAM: CT ABDOMEN AND PELVIS WITH CONTRAST TECHNIQUE: Multidetector CT imaging of the abdomen and pelvis was performed using the standard protocol following bolus administration of intravenous contrast. CONTRAST:  163mL OMNIPAQUE IOHEXOL 300 MG/ML  SOLN COMPARISON:  None. FINDINGS: Lung bases demonstrate linear atelectasis over the right base. Abdominal images demonstrate mild diffuse low-attenuation of the liver without focal mass. The spleen, pancreas, gallbladder and adrenal glands are within normal. Stomach is normal. Kidneys an ureters are within normal.  Surgical absence of the appendix. Vascular structures are within normal. There is diverticulosis of the colon with inflammatory change adjacent the distal descending and sigmoid colon. This likely represents acute diverticulitis with perforation. There is a collection of extraluminal air medially anterior to the inflamed diverticula of the sigmoid colon. This may be a developing diverticular abscess. There are a few small foci of free peritoneal air higher within the left abdomen. There is significant wall thickening of several adjacent small bowel loops in the left mid abdomen slightly higher than the diverticular disease. Possible mild pneumatosis of several of these thick walled small bowel loops. Minimal dilatation of several small bowel loops proximal to this region. These findings are likely due to secondary inflammatory change and ileus. There is patchy mesenteric fluid. No mesenteric venous gas. A primary small bowel inflammatory process is possible but less likely. Pelvic images demonstrate IUD in adequate position. Small uterine  calcified fibroid. Ovaries, bladder and rectum are normal. Remaining bones and soft tissues are within normal. IMPRESSION: Inflammatory process in the left mid to lower abdomen likely due to an acute diverticulitis of the sigmoid colon with perforation. Resulting peritonitis with secondary inflammation of several adjacent small bowel loops and focal ileus. Possible developing diverticular abscess immediately anterior to the inflamed sigmoid colon. Right basilar atelectasis. Critical Value/emergent results were called by telephone at the time of interpretation on 09/30/2015 at 9:58 pm to Dr. Fredia Sorrow , who verbally acknowledged these results. Electronically Signed   By: Marin Olp M.D.   On: 09/30/2015 21:58   I have personally reviewed and evaluated these images and lab results as part of my medical decision-making.   EKG Interpretation None      MDM   Final  diagnoses:  Diverticulitis of large intestine with perforation and abscess without bleeding    CT scan shows evidence of diverticulitis with some small perforations and perhaps small abscess. Patient started on Cipro and Flagyl will require admission. Consultation to general surgery in process. Admission to be done by hospitalist.    Fredia Sorrow, MD 09/30/15 [phone]

## 2015-09-30 NOTE — ED Notes (Signed)
Pt states that she has been having abd pain with diarrhea since Saturday.

## 2015-09-30 NOTE — ED Notes (Signed)
Pt not in waiting area at this time.  

## 2015-10-01 ENCOUNTER — Encounter (HOSPITAL_COMMUNITY): Payer: Self-pay | Admitting: *Deleted

## 2015-10-01 DIAGNOSIS — F411 Generalized anxiety disorder: Secondary | ICD-10-CM | POA: Diagnosis present

## 2015-10-01 DIAGNOSIS — Z833 Family history of diabetes mellitus: Secondary | ICD-10-CM | POA: Diagnosis not present

## 2015-10-01 DIAGNOSIS — B9789 Other viral agents as the cause of diseases classified elsewhere: Secondary | ICD-10-CM | POA: Diagnosis present

## 2015-10-01 DIAGNOSIS — K5720 Diverticulitis of large intestine with perforation and abscess without bleeding: Principal | ICD-10-CM

## 2015-10-01 DIAGNOSIS — F329 Major depressive disorder, single episode, unspecified: Secondary | ICD-10-CM | POA: Diagnosis present

## 2015-10-01 DIAGNOSIS — K668 Other specified disorders of peritoneum: Secondary | ICD-10-CM | POA: Diagnosis present

## 2015-10-01 DIAGNOSIS — R7303 Prediabetes: Secondary | ICD-10-CM | POA: Diagnosis not present

## 2015-10-01 DIAGNOSIS — Z7984 Long term (current) use of oral hypoglycemic drugs: Secondary | ICD-10-CM | POA: Diagnosis not present

## 2015-10-01 DIAGNOSIS — E876 Hypokalemia: Secondary | ICD-10-CM

## 2015-10-01 DIAGNOSIS — R51 Headache: Secondary | ICD-10-CM | POA: Diagnosis present

## 2015-10-01 DIAGNOSIS — K567 Ileus, unspecified: Secondary | ICD-10-CM | POA: Diagnosis present

## 2015-10-01 DIAGNOSIS — Z8249 Family history of ischemic heart disease and other diseases of the circulatory system: Secondary | ICD-10-CM | POA: Diagnosis not present

## 2015-10-01 DIAGNOSIS — Z6841 Body Mass Index (BMI) 40.0 and over, adult: Secondary | ICD-10-CM | POA: Diagnosis not present

## 2015-10-01 DIAGNOSIS — E119 Type 2 diabetes mellitus without complications: Secondary | ICD-10-CM | POA: Diagnosis present

## 2015-10-01 DIAGNOSIS — D649 Anemia, unspecified: Secondary | ICD-10-CM | POA: Diagnosis present

## 2015-10-01 LAB — CBC
HEMATOCRIT: 37.7 % (ref 36.0–46.0)
HEMOGLOBIN: 12.5 g/dL (ref 12.0–15.0)
MCH: 30.2 pg (ref 26.0–34.0)
MCHC: 33.2 g/dL (ref 30.0–36.0)
MCV: 91.1 fL (ref 78.0–100.0)
Platelets: 357 10*3/uL (ref 150–400)
RBC: 4.14 MIL/uL (ref 3.87–5.11)
RDW: 13.5 % (ref 11.5–15.5)
WBC: 12.3 10*3/uL — ABNORMAL HIGH (ref 4.0–10.5)

## 2015-10-01 LAB — GLUCOSE, CAPILLARY
GLUCOSE-CAPILLARY: 98 mg/dL (ref 65–99)
GLUCOSE-CAPILLARY: 106 mg/dL — AB (ref 65–99)
GLUCOSE-CAPILLARY: 75 mg/dL (ref 65–99)
Glucose-Capillary: 111 mg/dL — ABNORMAL HIGH (ref 65–99)
Glucose-Capillary: 87 mg/dL (ref 65–99)
Glucose-Capillary: 91 mg/dL (ref 65–99)

## 2015-10-01 LAB — BASIC METABOLIC PANEL
ANION GAP: 9 (ref 5–15)
BUN: 6 mg/dL (ref 6–20)
CALCIUM: 8.2 mg/dL — AB (ref 8.9–10.3)
CO2: 26 mmol/L (ref 22–32)
Chloride: 105 mmol/L (ref 101–111)
Creatinine, Ser: 0.67 mg/dL (ref 0.44–1.00)
GLUCOSE: 114 mg/dL — AB (ref 65–99)
POTASSIUM: 3.5 mmol/L (ref 3.5–5.1)
Sodium: 140 mmol/L (ref 135–145)

## 2015-10-01 MED ORDER — ONDANSETRON HCL 4 MG PO TABS: 4.0000 mg | ORAL_TABLET | Freq: Four times a day (QID) | ORAL | Status: DC | PRN

## 2015-10-01 MED ORDER — MORPHINE SULFATE (PF) 4 MG/ML IV SOLN
4.0000 mg | INTRAVENOUS | Status: DC | PRN
  Administered 2015-10-01 (×3): 4 mg via INTRAVENOUS
  Filled 2015-10-01: qty 1

## 2015-10-01 MED ORDER — ACETAMINOPHEN 325 MG PO TABS
650.0000 mg | ORAL_TABLET | ORAL | Status: DC | PRN
  Administered 2015-10-01 – 2015-10-03 (×3): 650 mg via ORAL
  Filled 2015-10-01 (×5): qty 2

## 2015-10-01 MED ORDER — ONDANSETRON HCL 4 MG/2ML IJ SOLN: 4.0000 mg | Freq: Four times a day (QID) | INTRAMUSCULAR | Status: DC | PRN

## 2015-10-01 MED ORDER — HEPARIN SODIUM (PORCINE) 5000 UNIT/ML IJ SOLN
5000.0000 [IU] | Freq: Three times a day (TID) | INTRAMUSCULAR | Status: DC
  Administered 2015-10-01 – 2015-10-03 (×7): 5000 [IU] via SUBCUTANEOUS
  Filled 2015-10-01 (×6): qty 1

## 2015-10-01 MED ORDER — INFLUENZA VAC SPLIT QUAD 0.5 ML IM SUSY
0.5000 mL | PREFILLED_SYRINGE | INTRAMUSCULAR | Status: AC
  Administered 2015-10-02: 0.5 mL via INTRAMUSCULAR
  Filled 2015-10-01: qty 0.5

## 2015-10-01 MED ORDER — INSULIN ASPART 100 UNIT/ML ~~LOC~~ SOLN
0.0000 [IU] | Freq: Three times a day (TID) | SUBCUTANEOUS | Status: DC
  Administered 2015-10-02: 1 [IU] via SUBCUTANEOUS

## 2015-10-01 MED ORDER — METRONIDAZOLE IN NACL 5-0.79 MG/ML-% IV SOLN
500.0000 mg | Freq: Three times a day (TID) | INTRAVENOUS | Status: DC
  Administered 2015-10-01 – 2015-10-03 (×8): 500 mg via INTRAVENOUS
  Filled 2015-10-01 (×8): qty 100

## 2015-10-01 MED ORDER — INSULIN ASPART 100 UNIT/ML ~~LOC~~ SOLN: 0.0000 [IU] | SUBCUTANEOUS | Status: DC

## 2015-10-01 MED ORDER — VENLAFAXINE HCL ER 75 MG PO CP24
75.0000 mg | ORAL_CAPSULE | Freq: Every day | ORAL | Status: DC
  Administered 2015-10-01 – 2015-10-03 (×3): 75 mg via ORAL
  Filled 2015-10-01 (×3): qty 1

## 2015-10-01 MED ORDER — CIPROFLOXACIN IN D5W 400 MG/200ML IV SOLN
400.0000 mg | Freq: Two times a day (BID) | INTRAVENOUS | Status: DC
  Administered 2015-10-01 – 2015-10-03 (×5): 400 mg via INTRAVENOUS
  Filled 2015-10-01 (×5): qty 200

## 2015-10-01 MED ORDER — MORPHINE SULFATE (PF) 4 MG/ML IV SOLN
INTRAVENOUS | Status: AC
  Administered 2015-10-01: 4 mg via INTRAVENOUS
  Filled 2015-10-01: qty 1

## 2015-10-01 MED ORDER — HEPARIN SODIUM (PORCINE) 5000 UNIT/ML IJ SOLN
INTRAMUSCULAR | Status: AC
  Administered 2015-10-01: 5000 [IU] via SUBCUTANEOUS
  Filled 2015-10-01: qty 1

## 2015-10-01 MED ORDER — BUSPIRONE HCL 5 MG PO TABS
7.5000 mg | ORAL_TABLET | Freq: Three times a day (TID) | ORAL | Status: DC
  Administered 2015-10-01 – 2015-10-03 (×7): 7.5 mg via ORAL
  Filled 2015-10-01 (×7): qty 2

## 2015-10-01 MED ORDER — DEXTROSE-NACL 5-0.9 % IV SOLN
INTRAVENOUS | Status: DC
  Administered 2015-10-01 – 2015-10-02 (×4): via INTRAVENOUS
  Administered 2015-10-03: 100 mL/h via INTRAVENOUS

## 2015-10-01 NOTE — Consult Note (Signed)
Reason for Consult: Sigmoid diverticulitis with perforation Referring Physician: Hospitalist  Jennifer [name] is an 39 y.o. female.  HPI: Patient is a 39 year old black female with a known history of diverticulosis who presented to the emergency room with worsening abdominal pain. CT scan the abdomen revealed sigmoid diverticulitis with microperforation. No frank free fluid or pneumoperitoneum are noted. She was admitted to the hospital for further evaluation and treatment. She last had a colonoscopy in April 2015. She is thirsty. Her abdominal pain has somewhat eased with pain medication.  Past Medical History  Diagnosis Date  . Family history of diabetes mellitus   . History of recurrent UTIs   . Obesity   . Depression   . Alopecia   . Abnormal vaginal Pap smear   . Diabetes mellitus without complication ([place])   . Diverticulosis   . Hemorrhoids     Past Surgical History  Procedure Laterality Date  . Appendectomy    . Knee arthroscopy      right  . Colonoscopy N/A 03/13/2014    Dr. Gala Romney- grade 3 hemorrhoids, colonic diverticulosis bx= benign lymphoid polyp    Family History  Problem Relation Age of Onset  . Adopted: Yes  . Hypertension Mother   . Diabetes Mother   . Other Mother     cholangiocarcinoma, age 24, deceased  . Colon cancer Neg Hx     Social History:  reports that she has never smoked. She has never used smokeless tobacco. She reports that she does not drink alcohol or use illicit drugs.  Allergies: No Known Allergies  Medications: I have reviewed the patient's current medications.  Results for orders placed or performed during the hospital encounter of 09/30/15 (from the past 48 hour(s))  Amylase     Status: None   Collection Time: 09/30/15  4:31 PM  Result Value Ref Range   Amylase 32 28 - 100 U/L  CBC WITH DIFFERENTIAL     Status: Abnormal   Collection Time: 09/30/15  4:31 PM  Result Value Ref Range   WBC 14.1 (H) 4.0 - 10.5 K/uL   RBC 4.68 3.87 - 5.11  MIL/uL   Hemoglobin 14.4 12.0 - 15.0 g/dL   HCT 42.4 36.0 - 46.0 %   MCV 90.6 78.0 - 100.0 fL   MCH 30.8 26.0 - 34.0 pg   MCHC 34.0 30.0 - 36.0 g/dL   RDW 13.5 11.5 - 15.5 %   Platelets 344 150 - 400 K/uL   Neutrophils Relative % 85 %   Neutro Abs 12.1 (H) 1.7 - 7.7 K/uL   Lymphocytes Relative 11 %   Lymphs Abs 1.5 0.7 - 4.0 K/uL   Monocytes Relative 4 %   Monocytes Absolute 0.5 0.1 - 1.0 K/uL   Eosinophils Relative 0 %   Eosinophils Absolute 0.0 0.0 - 0.7 K/uL   Basophils Relative 0 %   Basophils Absolute 0.0 0.0 - 0.1 K/uL  Comprehensive metabolic panel     Status: Abnormal   Collection Time: 09/30/15  4:31 PM  Result Value Ref Range   Sodium 138 135 - 145 mmol/L   Potassium 3.3 (L) 3.5 - 5.1 mmol/L   Chloride 100 (L) 101 - 111 mmol/L   CO2 27 22 - 32 mmol/L   Glucose, Bld 115 (H) 65 - 99 mg/dL   BUN 6 6 - 20 mg/dL   Creatinine, Ser 0.80 0.44 - 1.00 mg/dL   Calcium 8.9 8.9 - 10.3 mg/dL   Total Protein 8.4 (H) 6.5 -  8.1 g/dL   Albumin 4.1 3.5 - 5.0 g/dL   AST 31 15 - 41 U/L   ALT 29 14 - 54 U/L   Alkaline Phosphatase 121 38 - 126 U/L   Total Bilirubin 0.7 0.3 - 1.2 mg/dL   GFR calc non Af Amer >60 >60 mL/min   GFR calc Af Amer >60 >60 mL/min    Comment: (NOTE) The eGFR has been calculated using the CKD EPI equation. This calculation has not been validated in all clinical situations. eGFR's persistently <60 mL/min signify possible Chronic Kidney Disease.    Anion gap 11 5 - 15  Lipase, blood     Status: None   Collection Time: 09/30/15  4:31 PM  Result Value Ref Range   Lipase 21 11 - 51 U/L  Urinalysis, Routine w reflex microscopic (not at [facility])     Status: Abnormal   Collection Time: 09/30/15  8:40 PM  Result Value Ref Range   Color, Urine YELLOW YELLOW   APPearance CLEAR CLEAR   Specific Gravity, Urine 1.025 1.005 - 1.030   pH 5.5 5.0 - 8.0   Glucose, UA NEGATIVE NEGATIVE mg/dL   Hgb urine dipstick LARGE (A) NEGATIVE   Bilirubin Urine MODERATE (A) NEGATIVE    Ketones, ur NEGATIVE NEGATIVE mg/dL   Protein, ur 100 (A) NEGATIVE mg/dL   Nitrite NEGATIVE NEGATIVE   Leukocytes, UA TRACE (A) NEGATIVE  Pregnancy, urine     Status: None   Collection Time: 09/30/15  8:40 PM  Result Value Ref Range   Preg Test, Ur NEGATIVE NEGATIVE    Comment:        THE SENSITIVITY OF THIS METHODOLOGY IS >20 mIU/mL.   Urine microscopic-add on     Status: Abnormal   Collection Time: 09/30/15  8:40 PM  Result Value Ref Range   Squamous Epithelial / LPF 6-30 (A) NONE SEEN    Comment: Please note change in reference range.   WBC, UA 0-5 0 - 5 WBC/hpf    Comment: Please note change in reference range.   RBC / HPF 6-30 0 - 5 RBC/hpf    Comment: Please note change in reference range.   Bacteria, UA MANY (A) NONE SEEN    Comment: Please note change in reference range.   Urine-Other MUCOUS PRESENT   Glucose, capillary     Status: Abnormal   Collection Time: 10/01/15 12:35 AM  Result Value Ref Range   Glucose-Capillary 111 (H) 65 - 99 mg/dL   Comment 1 Notify RN    Comment 2 Document in Chart   Glucose, capillary     Status: None   Collection Time: 10/01/15  4:05 AM  Result Value Ref Range   Glucose-Capillary 98 65 - 99 mg/dL  Basic metabolic panel     Status: Abnormal   Collection Time: 10/01/15  6:53 AM  Result Value Ref Range   Sodium 140 135 - 145 mmol/L   Potassium 3.5 3.5 - 5.1 mmol/L   Chloride 105 101 - 111 mmol/L   CO2 26 22 - 32 mmol/L   Glucose, Bld 114 (H) 65 - 99 mg/dL   BUN 6 6 - 20 mg/dL   Creatinine, Ser 0.67 0.44 - 1.00 mg/dL   Calcium 8.2 (L) 8.9 - 10.3 mg/dL   GFR calc non Af Amer >60 >60 mL/min   GFR calc Af Amer >60 >60 mL/min    Comment: (NOTE) The eGFR has been calculated using the CKD EPI equation. This calculation has not  been validated in all clinical situations. eGFR's persistently <60 mL/min signify possible Chronic Kidney Disease.    Anion gap 9 5 - 15  CBC     Status: Abnormal   Collection Time: 10/01/15  6:53 AM   Result Value Ref Range   WBC 12.3 (H) 4.0 - 10.5 K/uL   RBC 4.14 3.87 - 5.11 MIL/uL   Hemoglobin 12.5 12.0 - 15.0 g/dL   HCT 37.7 36.0 - 46.0 %   MCV 91.1 78.0 - 100.0 fL   MCH 30.2 26.0 - 34.0 pg   MCHC 33.2 30.0 - 36.0 g/dL   RDW 13.5 11.5 - 15.5 %   Platelets 357 150 - 400 K/uL  Glucose, capillary     Status: Abnormal   Collection Time: 10/01/15  7:18 AM  Result Value Ref Range   Glucose-Capillary 106 (H) 65 - 99 mg/dL   Comment 1 Notify RN     Ct Abdomen Pelvis W Contrast  09/30/2015  CLINICAL DATA:  Lower abdominal pain and diarrhea 4 days. EXAM: CT ABDOMEN AND PELVIS WITH CONTRAST TECHNIQUE: Multidetector CT imaging of the abdomen and pelvis was performed using the standard protocol following bolus administration of intravenous contrast. CONTRAST:  13m OMNIPAQUE IOHEXOL 300 MG/ML  SOLN COMPARISON:  None. FINDINGS: Lung bases demonstrate linear atelectasis over the right base. Abdominal images demonstrate mild diffuse low-attenuation of the liver without focal mass. The spleen, pancreas, gallbladder and adrenal glands are within normal. Stomach is normal. Kidneys an ureters are within normal. Surgical absence of the appendix. Vascular structures are within normal. There is diverticulosis of the colon with inflammatory change adjacent the distal descending and sigmoid colon. This likely represents acute diverticulitis with perforation. There is a collection of extraluminal air medially anterior to the inflamed diverticula of the sigmoid colon. This may be a developing diverticular abscess. There are a few small foci of free peritoneal air higher within the left abdomen. There is significant wall thickening of several adjacent small bowel loops in the left mid abdomen slightly higher than the diverticular disease. Possible mild pneumatosis of several of these thick walled small bowel loops. Minimal dilatation of several small bowel loops proximal to this region. These findings are likely  due to secondary inflammatory change and ileus. There is patchy mesenteric fluid. No mesenteric venous gas. A primary small bowel inflammatory process is possible but less likely. Pelvic images demonstrate IUD in adequate position. Small uterine calcified fibroid. Ovaries, bladder and rectum are normal. Remaining bones and soft tissues are within normal. IMPRESSION: Inflammatory process in the left mid to lower abdomen likely due to an acute diverticulitis of the sigmoid colon with perforation. Resulting peritonitis with secondary inflammation of several adjacent small bowel loops and focal ileus. Possible developing diverticular abscess immediately anterior to the inflamed sigmoid colon. Right basilar atelectasis. Critical Value/emergent results were called by telephone at the time of interpretation on 09/30/2015 at 9:58 pm to Dr. SFredia Sorrow, who verbally acknowledged these results. Electronically Signed   By: DMarin OlpM.D.   On: 09/30/2015 21:58    ROS: See chart  Blood pressure 104/71, pulse 110, temperature 100.1 F (37.8 C), temperature source Oral, resp. rate 16, height '5\' 7"'  (1.702 m), weight 128.504 kg (283 lb 4.8 oz), SpO2 99 %. Physical Exam: Pleasant black female in no acute distress.  Abdomen is soft with some tenderness to deep palpation along the left side of the abdomen. No rigidity is noted. Due to body habitus, examination is somewhat limited.  Assessment/Plan: Impression: Sigmoid diverticulitis with perforation, contained. No abscess cavity has formed yet. No need for acute surgical intervention. Would continue IV antibiotics as prescribed. Patient has desire for nutritional consultation due to her mobility obesity. May start on clear liquid diet. Will follow with you.   Emmanuela Ghazi A 10/01/2015, 10:23 AM

## 2015-10-01 NOTE — Telephone Encounter (Signed)
Noted that patient seen in ED

## 2015-10-01 NOTE — Plan of Care (Signed)
Problem: Food- and Nutrition-Related Knowledge Deficit (NB-1.1) Goal: Nutrition education Formal process to instruct or train a patient/client in a skill or to impart knowledge to help patients/clients voluntarily manage or modify food choices and eating behavior to maintain or improve health. Outcome: Adequate for Discharge Nutrition consult to educate regarding dietary recommendations for diverticulitis. Pt follows regular diet at home. She believes it was the popcorn she ate on Saturday night that facilitated her current problem. Provided handout "Low-Fiber Nutrition therapy from [employer]. Reviewed basic principles such as limiting fried foods, high fiber foods such as raw vegetables, whole wheat bread, dry beans and brown rice until cleared by MD to gradually increase daily fiber intake to 25-35 gr daily. Pt demonstrated understanding and questions were answered that were specific to her favorite foods. Expect good compliance.

## 2015-10-01 NOTE — Care Management Note (Signed)
Case Management Note  Patient Details  Name: Jennifer [name] MRN: [id number] Date of Birth: 1976/06/24  Subjective/Objective:                  Pt admitted from home with diverticulitis. Pt lives with her daughter and will return home at discharge. Pt is independent with ADL's.  Action/Plan: No CM needs noted.  Expected Discharge Date:                  Expected Discharge Plan:  Home/Self Care  In-House Referral:  NA  Discharge planning Services  CM Consult  Post Acute Care Choice:  NA Choice offered to:  NA  DME Arranged:    DME Agency:     HH Arranged:    HH Agency:     Status of Service:  Completed, signed off  Medicare Important Message Given:    Date Medicare IM Given:    Medicare IM give by:    Date Additional Medicare IM Given:    Additional Medicare Important Message give by:     If discussed at [place] of Stay Meetings, dates discussed:    Additional Comments:  Joylene Draft, RN 10/01/2015, 2:45 PM

## 2015-10-01 NOTE — Progress Notes (Signed)
PROGRESS NOTE    Minyon Tow BJY:[id number] DOB: [birth year] DOA: 09/30/2015 PCP: Syliva Overman, MD  HPI/Brief narrative 39 y.o. female with multiple medical problems, including morbid obesity, recurrent UTIs, depression, alopecia, borderline DM on Metformin, hx of diverticulosis with prior diverticulitis, presented to the ER With 3 days hx of right lower quadrant pain.Evalatuion in the ER included anAbdominal pelvic CT which showed diverticulitis with perforation and possible early peritonitis and early abscess formation.She has a leukocytosis with WBC of 14K, and normal renal fx test. Her K was low at 3.3 mEq/L, and her pregnancy test was negative.   Assessment/Plan:  Acute sigmoid diverticulitis with perforation & early abscess - Treating supportively with bowel rest (was nothing by mouth on admission-changed to clear liquids by surgery), IV fluids, IV antibiotics (Cipro & Flagyl) and pain management. - Surgery consultation appreciated. Discussed with Dr. Lovell Sheehan. No role for acute surgical intervention currently. - Patient stated that she had a colonoscopy approximately 2 years ago. May need repeat colonoscopy after current acute phase has completely resolved. - Improving.  Hypokalemia - Replace in IV fluids and follow. Better.  Headache - Unclear etiology. Supportive treatment and follow.  Morbid obesity - will need dietary modification, exercise and weight loss. Dietitian input appreciated.  Type II DM,  Controlled. - Monitor CBGs and continue SSI. Controlled. Hold metformin.  Depression - Continue venlafaxine and BuSpar. No suicidal or homicidal ideations.    DVT prophylaxis: Subcutaneous heparin Code Status: Full Family Communication: None at bedside Disposition Plan: DC home when medically stable, possibly in 3-4 days.   Consultants:  General surgery  Procedures:  None  Antibiotics:  IV Cipro 11/15 >  IV Flagyl 11/15 >  Subjective: Abdomen  diffusely sore but better than on admission. Complains of headache-indicates left side of 4 head-states that she's had this since the weekend and attributes it to decreased appetite. Headache is intermittent, throbbing, nonradiating, moderate to severe in intensity at times, no aggravating or relieving factors and no other associated symptoms.   Objective: Filed Vitals:   09/30/15 2300 09/30/15 2330 10/01/15 0003 10/01/15 0514  BP: 104/85 110/58 115/66 104/71  Pulse: 115 114 114 110  Temp:   98.6 F (37 C) 100.1 F (37.8 C)  TempSrc:   Oral Oral  Resp: 12  14 16   Height:   5\' 7"  (1.702 m)   Weight:   128.504 kg (283 lb 4.8 oz)   SpO2: 97% 98% 97% 99%    Intake/Output Summary (Last 24 hours) at 10/01/15 1410 Last data filed at 10/01/15 0600  Gross per 24 hour  Intake   1655 ml  Output      0 ml  Net   1655 ml   Filed Weights   09/30/15 1622 10/01/15 0003  Weight: 142.883 kg (315 lb) 128.504 kg (283 lb 4.8 oz)     Exam:  General exam: Moderately built and morbidly obese pleasant young female lying comfortably supine in bed. Respiratory system: Clear. No increased work of breathing. Cardiovascular system: S1 & S2 heard, RRR. No JVD, murmurs, gallops, clicks or pedal edema. Gastrointestinal system: Abdomen nondistended, diffusely tender but more so in the suprapubic and left lower quadrant without rigidity, guarding or rebound. Normal bowel sounds heard. Central nervous system: Alert and oriented. No focal neurological deficits. Extremities: Symmetric 5 x 5 power.   Data Reviewed: Basic Metabolic Panel:  Recent Labs Lab 09/30/15 1631 10/01/15 0653  NA 138 140  K 3.3* 3.5  CL 100* 105  CO2  27 26  GLUCOSE 115* 114*  BUN 6 6  CREATININE 0.80 0.67  CALCIUM 8.9 8.2*   Liver Function Tests:  Recent Labs Lab 09/30/15 1631  AST 31  ALT 29  ALKPHOS 121  BILITOT 0.7  PROT 8.4*  ALBUMIN 4.1    Recent Labs Lab 09/30/15 1631  LIPASE 21  AMYLASE 32   No  results for input(s): AMMONIA in the last 168 hours. CBC:  Recent Labs Lab 09/30/15 1631 10/01/15 0653  WBC 14.1* 12.3*  NEUTROABS 12.1*  --   HGB 14.4 12.5  HCT 42.4 37.7  MCV 90.6 91.1  PLT 344 357   Cardiac Enzymes: No results for input(s): CKTOTAL, CKMB, CKMBINDEX, TROPONINI in the last 168 hours. BNP (last 3 results) No results for input(s): PROBNP in the last 8760 hours. CBG:  Recent Labs Lab 10/01/15 0035 10/01/15 0405 10/01/15 0718 10/01/15 1135  GLUCAP 111* 98 106* 75    No results found for this or any previous visit (from the past 240 hour(s)).         Studies: Ct Abdomen Pelvis W Contrast  09/30/2015  CLINICAL DATA:  Lower abdominal pain and diarrhea 4 days. EXAM: CT ABDOMEN AND PELVIS WITH CONTRAST TECHNIQUE: Multidetector CT imaging of the abdomen and pelvis was performed using the standard protocol following bolus administration of intravenous contrast. CONTRAST:  OMNIPAQUE IOHEXOL 300 MG/ML  SOLN COMPARISON:  None. FINDINGS: Lung bases demonstrate linear atelectasis over the right base. Abdominal images demonstrate mild diffuse low-attenuation of the liver without focal mass. The spleen, pancreas, gallbladder and adrenal glands are within normal. Stomach is normal. Kidneys an ureters are within normal. Surgical absence of the appendix. Vascular structures are within normal. There is diverticulosis of the colon with inflammatory change adjacent the distal descending and sigmoid colon. This likely represents acute diverticulitis with perforation. There is a collection of extraluminal air medially anterior to the inflamed diverticula of the sigmoid colon. This may be a developing diverticular abscess. There are a few small foci of free peritoneal air higher within the left abdomen. There is significant wall thickening of several adjacent small bowel loops in the left mid abdomen slightly higher than the diverticular disease. Possible mild pneumatosis of  several of these thick walled small bowel loops. Minimal dilatation of several small bowel loops proximal to this region. These findings are likely due to secondary inflammatory change and ileus. There is patchy mesenteric fluid. No mesenteric venous gas. A primary small bowel inflammatory process is possible but less likely. Pelvic images demonstrate IUD in adequate position. Small uterine calcified fibroid. Ovaries, bladder and rectum are normal. Remaining bones and soft tissues are within normal. IMPRESSION: Inflammatory process in the left mid to lower abdomen likely due to an acute diverticulitis of the sigmoid colon with perforation. Resulting peritonitis with secondary inflammation of several adjacent small bowel loops and focal ileus. Possible developing diverticular abscess immediately anterior to the inflamed sigmoid colon. Right basilar atelectasis. Critical Value/emergent results were called by telephone at the time of interpretation on 09/30/2015 at 9:58 pm to Dr. Vanetta Mulders , who verbally acknowledged these results. Electronically Signed   By: Elberta Fortis M.D.   On: 09/30/2015 21:58        Scheduled Meds: . busPIRone  7.5 mg Oral TID  . ciprofloxacin  400 mg Intravenous Q12H  . heparin  5,000 Units Subcutaneous 3 times per day  . [START ON 10/02/2015] Influenza vac split quadrivalent PF  0.5 mL Intramuscular Tomorrow-1000  .  insulin aspart  0-15 Units Subcutaneous 6 times per day  . metroNIDAZOLE  500 mg Intravenous Once  . metronidazole  500 mg Intravenous Q8H  . potassium chloride  40 mEq Oral Daily  . venlafaxine XR  75 mg Oral Q breakfast   Continuous Infusions: . dextrose 5 % and 0.9% NaCl 100 mL/hr at 10/01/15 1324    Principal Problem:   Diverticulitis large intestine Active Problems:   Morbid obesity (HCC)   Prediabetes   GAD (generalized anxiety disorder)    Time spent: 35 minutes.    Marcellus Scott, MD, FACP, FHM. [facility] Pager  [phone]  If 7PM-7AM, please contact night-coverage [email or web address] Password [email or web address] 10/01/2015, 2:10 PM    LOS: 0 days

## 2015-10-02 LAB — GLUCOSE, CAPILLARY
GLUCOSE-CAPILLARY: 96 mg/dL (ref 65–99)
Glucose-Capillary: 128 mg/dL — ABNORMAL HIGH (ref 65–99)
Glucose-Capillary: 95 mg/dL (ref 65–99)
Glucose-Capillary: 142 mg/dL — ABNORMAL HIGH (ref 65–99)

## 2015-10-02 LAB — CBC
HEMATOCRIT: 34.2 % — AB (ref 36.0–46.0)
Hemoglobin: 11.3 g/dL — ABNORMAL LOW (ref 12.0–15.0)
MCH: 30.2 pg (ref 26.0–34.0)
MCHC: 33 g/dL (ref 30.0–36.0)
MCV: 91.4 fL (ref 78.0–100.0)
Platelets: 341 10*3/uL (ref 150–400)
RBC: 3.74 MIL/uL — AB (ref 3.87–5.11)
RDW: 13.5 % (ref 11.5–15.5)
WBC: 10.8 10*3/uL — AB (ref 4.0–10.5)

## 2015-10-02 LAB — BASIC METABOLIC PANEL
ANION GAP: 7 (ref 5–15)
BUN: 5 mg/dL — ABNORMAL LOW (ref 6–20)
CO2: 25 mmol/L (ref 22–32)
Calcium: 7.9 mg/dL — ABNORMAL LOW (ref 8.9–10.3)
Chloride: 104 mmol/L (ref 101–111)
Creatinine, Ser: 0.57 mg/dL (ref 0.44–1.00)
GFR calc Af Amer: >60 mL/min (ref >60)
GFR calc non Af Amer: >60 mL/min (ref >60)
Glucose, Bld: 138 mg/dL — ABNORMAL HIGH (ref 65–99)
POTASSIUM: 3.1 mmol/L — AB (ref 3.5–5.1)
Sodium: 136 mmol/L (ref 135–145)

## 2015-10-02 LAB — MAGNESIUM: MAGNESIUM: 1.8 mg/dL (ref 1.7–2.4)

## 2015-10-02 MED ORDER — IBUPROFEN 600 MG PO TABS
600.0000 mg | ORAL_TABLET | Freq: Three times a day (TID) | ORAL | Status: DC
  Administered 2015-10-02 – 2015-10-03 (×4): 600 mg via ORAL
  Filled 2015-10-02 (×4): qty 1

## 2015-10-02 MED ORDER — OXYMETAZOLINE HCL 0.05 % NA SOLN
1.0000 | Freq: Two times a day (BID) | NASAL | Status: DC
  Administered 2015-10-02 – 2015-10-03 (×3): 1 via NASAL
  Filled 2015-10-02: qty 15

## 2015-10-02 MED ORDER — POTASSIUM CHLORIDE CRYS ER 20 MEQ PO TBCR
30.0000 meq | EXTENDED_RELEASE_TABLET | ORAL | Status: AC
  Administered 2015-10-02 (×2): 30 meq via ORAL
  Filled 2015-10-02 (×2): qty 1

## 2015-10-02 NOTE — Progress Notes (Signed)
Subjective: Patient starting to have bowel movements. Is tolerating liquid diet well. Abdominal pain has eased.  Objective: Vital signs in last 24 hours: Temp:  [98.3 F (36.8 C)-99.1 F (37.3 C)] 98.3 F (36.8 C) (11/17 0556) Pulse Rate:  [100-103] 103 (11/17 0556) Resp:  [16-18] 18 (11/17 0556) BP: (113-130)/(66-79) 113/66 mmHg (11/17 0556) SpO2:  [94 %-100 %] 97 % (11/17 0556) Last BM Date: 10/01/15  Intake/Output from previous day: 11/16 0701 - 11/17 0700 In: 3100 [I.V.:2400; IV Piggyback:700] Out: -  Intake/Output this shift:    General appearance: alert, cooperative and no distress GI: Soft, minimal tenderness noted along the left side of the abdomen. No rigidity is noted. Bowel sounds are active.  Lab Results:   Recent Labs  10/01/15 0653 10/02/15 0632  WBC 12.3* 10.8*  HGB 12.5 11.3*  HCT 37.7 34.2*  PLT 357 341   BMET  Recent Labs  10/01/15 0653 10/02/15 0632  NA 140 136  K 3.5 3.1*  CL 105 104  CO2 26 25  GLUCOSE 114* 138*  BUN 6 5*  CREATININE 0.67 0.57  CALCIUM 8.2* 7.9*   PT/INR No results for input(s): LABPROT, INR in the last 72 hours.  Studies/Results: Ct Abdomen Pelvis W Contrast  09/30/2015  CLINICAL DATA:  Lower abdominal pain and diarrhea 4 days. EXAM: CT ABDOMEN AND PELVIS WITH CONTRAST TECHNIQUE: Multidetector CT imaging of the abdomen and pelvis was performed using the standard protocol following bolus administration of intravenous contrast. CONTRAST:  157mL OMNIPAQUE IOHEXOL 300 MG/ML  SOLN COMPARISON:  None. FINDINGS: Lung bases demonstrate linear atelectasis over the right base. Abdominal images demonstrate mild diffuse low-attenuation of the liver without focal mass. The spleen, pancreas, gallbladder and adrenal glands are within normal. Stomach is normal. Kidneys an ureters are within normal. Surgical absence of the appendix. Vascular structures are within normal. There is diverticulosis of the colon with inflammatory change  adjacent the distal descending and sigmoid colon. This likely represents acute diverticulitis with perforation. There is [name] collection of extraluminal air medially anterior to the inflamed diverticula of the sigmoid colon. This may be [name] developing diverticular abscess. There are [name] few small foci of free peritoneal air higher within the left abdomen. There is significant wall thickening of several adjacent small bowel loops in the left mid abdomen slightly higher than the diverticular disease. Possible mild pneumatosis of several of these thick walled small bowel loops. Minimal dilatation of several small bowel loops proximal to this region. These findings are likely due to secondary inflammatory change and ileus. There is patchy mesenteric fluid. No mesenteric venous gas. [name] primary small bowel inflammatory process is possible but less likely. Pelvic images demonstrate IUD in adequate position. Small uterine calcified fibroid. Ovaries, bladder and rectum are normal. Remaining bones and soft tissues are within normal. IMPRESSION: Inflammatory process in the left mid to lower abdomen likely due to an acute diverticulitis of the sigmoid colon with perforation. Resulting peritonitis with secondary inflammation of several adjacent small bowel loops and focal ileus. Possible developing diverticular abscess immediately anterior to the inflamed sigmoid colon. Right basilar atelectasis. Critical Value/emergent results were called by telephone at the time of interpretation on 09/30/2015 at 9:58 pm to Dr. Fredia Sorrow , who verbally acknowledged these results. Electronically Signed   By: Marin Olp M.D.   On: 09/30/2015 21:58    Anti-infectives: Anti-infectives    Start     Dose/Rate Route Frequency Ordered Stop   10/01/15 1000  ciprofloxacin (CIPRO) IVPB 400  mg     400 mg 200 mL/hr over 60 Minutes Intravenous Every 12 hours 10/01/15 0004     10/01/15 0004  metroNIDAZOLE (FLAGYL) IVPB 500 mg     500 mg 100 mL/hr  over 60 Minutes Intravenous Every 8 hours 10/01/15 0004 10/11/15 0014   09/30/15 2315  ciprofloxacin (CIPRO) IVPB 400 mg     400 mg 200 mL/hr over 60 Minutes Intravenous  Once 09/30/15 2302 10/01/15 0018   09/30/15 2315  metroNIDAZOLE (FLAGYL) IVPB 500 mg     500 mg 100 mL/hr over 60 Minutes Intravenous  Once 09/30/15 2302        Assessment/Plan: Impression: Sigmoid diverticulitis with microperforation, resolving Plan: Okay for advancement to soft diet. Anticipate discharge in next 24-48 hours.  LOS: 1 day    Jennifer [name] 10/02/2015

## 2015-10-02 NOTE — Progress Notes (Signed)
PROGRESS NOTE    Jennifer [name] ION:[id number] DOB: [birth year] DOA: 09/30/2015 PCP: Syliva Overman, MD  HPI/Brief narrative 39 y.o. female with multiple medical problems, including morbid obesity, recurrent UTIs, depression, alopecia, borderline DM on Metformin, hx of diverticulosis with prior diverticulitis, presented to the ER With 3 days hx of right lower quadrant pain.Evalatuion in the ER included anAbdominal pelvic CT which showed diverticulitis with perforation and possible early peritonitis and early abscess formation.She has a leukocytosis with WBC of 14K, and normal renal fx test. Her K was low at 3.3 mEq/L, and her pregnancy test was negative.   Assessment/Plan:  Acute sigmoid diverticulitis with perforation & early abscess - Treating supportively with bowel rest (was nothing by mouth on admission-changed to clear liquids by surgery), IV fluids, IV antibiotics (Cipro & Flagyl) and pain management. - Surgery consultation appreciated. Discussed with Dr. Lovell Sheehan. No role for acute surgical intervention currently. - Patient stated that she had a colonoscopy approximately 2 years ago. May need repeat colonoscopy after current acute phase has completely resolved. - Improving. Tolerating liquid diet. As discussed with surgery, advance to soft diet. Possible discharge home 11/18.  Hypokalemia - Replace and follow  Headache - Unclear etiology.  - ? Sinusitis-patient has nasal stuffiness and postnasal drip. Continue antibiotics as below. We'll add Afrin nasal spray and scheduled ibuprofen for a couple of days and monitor.   Morbid obesity - will need dietary modification, exercise and weight loss. Dietitian input appreciated.  Type II DM,  Controlled. - Monitor CBGs and continue SSI. Controlled. Hold metformin.  Depression - Continue venlafaxine and BuSpar. No suicidal or homicidal ideations.  Anemia  - Follow CBCs.     DVT prophylaxis: Subcutaneous heparin Code  Status: Full Family Communication: None at bedside Disposition Plan: DC home when medically stable, possibly 11/18.    Consultants:  General surgery  Procedures:  None  Antibiotics:  IV Cipro 11/15 >  IV Flagyl 11/15 >  Subjective: Tolerating liquid diet. Abdominal soreness/pain improved. 4 episodes of diarrhea in the last 24 hours. Left sided frontal headache-slightly better than yesterday but still has it intermittently and rates it as 7/10. Nasal stuffiness and postnasal drip. No visual symptoms.   Objective: Filed Vitals:   10/01/15 0514 10/01/15 1442 10/01/15 2237 10/02/15 0556  BP: 104/71 130/79 124/75 113/66  Pulse: 110 100 103 103  Temp: 100.1 F (37.8 C) 98.4 F (36.9 C) 99.1 F (37.3 C) 98.3 F (36.8 C)  TempSrc: Oral Oral Oral Oral  Resp: 16 16 18 18   Height:      Weight:      SpO2: 99% 94% 100% 97%    Intake/Output Summary (Last 24 hours) at 10/02/15 0956 Last data filed at 10/02/15 0600  Gross per 24 hour  Intake   3100 ml  Output      0 ml  Net   3100 ml   Filed Weights   09/30/15 1622 10/01/15 0003  Weight: 142.883 kg (315 lb) 128.504 kg (283 lb 4.8 oz)     Exam:  General exam: Moderately built and morbidly obese pleasant young female lying comfortably supine in bed. Respiratory system: Clear. No increased work of breathing. Cardiovascular system: S1 & S2 heard, RRR. No JVD, murmurs, gallops, clicks or pedal edema. Gastrointestinal system: Abdomen nondistended, diffusely tender (improved)but more so in the suprapubic and left lower quadrant without rigidity, guarding or rebound. Normal bowel sounds heard. Central nervous system: Alert and oriented. No focal neurological deficits. Extremities: Symmetric 5 x 5  power.   Data Reviewed: Basic Metabolic Panel:  Recent Labs Lab 09/30/15 1631 10/01/15 0653 10/02/15 0632  NA 138 140 136  K 3.3* 3.5 3.1*  CL 100* 105 104  CO2 27 26 25   GLUCOSE 115* 114* 138*  BUN 6 6 5*  CREATININE 0.80  0.67 0.57  CALCIUM 8.9 8.2* 7.9*   Liver Function Tests:  Recent Labs Lab 09/30/15 1631  AST 31  ALT 29  ALKPHOS 121  BILITOT 0.7  PROT 8.4*  ALBUMIN 4.1    Recent Labs Lab 09/30/15 1631  LIPASE 21  AMYLASE 32   No results for input(s): AMMONIA in the last 168 hours. CBC:  Recent Labs Lab 09/30/15 1631 10/01/15 0653 10/02/15 0632  WBC 14.1* 12.3* 10.8*  NEUTROABS 12.1*  --   --   HGB 14.4 12.5 11.3*  HCT 42.4 37.7 34.2*  MCV 90.6 91.1 91.4  PLT 344 357 341   Cardiac Enzymes: No results for input(s): CKTOTAL, CKMB, CKMBINDEX, TROPONINI in the last 168 hours. BNP (last 3 results) No results for input(s): PROBNP in the last 8760 hours. CBG:  Recent Labs Lab 10/01/15 0718 10/01/15 1135 10/01/15 1624 10/01/15 2046 10/02/15 0715  GLUCAP 106* 75 91 87 128*    No results found for this or any previous visit (from the past 240 hour(s)).         Studies: Ct Abdomen Pelvis W Contrast  09/30/2015  CLINICAL DATA:  Lower abdominal pain and diarrhea 4 days. EXAM: CT ABDOMEN AND PELVIS WITH CONTRAST TECHNIQUE: Multidetector CT imaging of the abdomen and pelvis was performed using the standard protocol following bolus administration of intravenous contrast. CONTRAST:  OMNIPAQUE IOHEXOL 300 MG/ML  SOLN COMPARISON:  None. FINDINGS: Lung bases demonstrate linear atelectasis over the right base. Abdominal images demonstrate mild diffuse low-attenuation of the liver without focal mass. The spleen, pancreas, gallbladder and adrenal glands are within normal. Stomach is normal. Kidneys an ureters are within normal. Surgical absence of the appendix. Vascular structures are within normal. There is diverticulosis of the colon with inflammatory change adjacent the distal descending and sigmoid colon. This likely represents acute diverticulitis with perforation. There is a collection of extraluminal air medially anterior to the inflamed diverticula of the sigmoid colon. This  may be a developing diverticular abscess. There are a few small foci of free peritoneal air higher within the left abdomen. There is significant wall thickening of several adjacent small bowel loops in the left mid abdomen slightly higher than the diverticular disease. Possible mild pneumatosis of several of these thick walled small bowel loops. Minimal dilatation of several small bowel loops proximal to this region. These findings are likely due to secondary inflammatory change and ileus. There is patchy mesenteric fluid. No mesenteric venous gas. A primary small bowel inflammatory process is possible but less likely. Pelvic images demonstrate IUD in adequate position. Small uterine calcified fibroid. Ovaries, bladder and rectum are normal. Remaining bones and soft tissues are within normal. IMPRESSION: Inflammatory process in the left mid to lower abdomen likely due to an acute diverticulitis of the sigmoid colon with perforation. Resulting peritonitis with secondary inflammation of several adjacent small bowel loops and focal ileus. Possible developing diverticular abscess immediately anterior to the inflamed sigmoid colon. Right basilar atelectasis. Critical Value/emergent results were called by telephone at the time of interpretation on 09/30/2015 at 9:58 pm to Dr. Vanetta Mulders , who verbally acknowledged these results. Electronically Signed   By: Elberta Fortis M.D.   On:  09/30/2015 21:58        Scheduled Meds: . busPIRone  7.5 mg Oral TID  . ciprofloxacin  400 mg Intravenous Q12H  . heparin  5,000 Units Subcutaneous 3 times per day  . insulin aspart  0-9 Units Subcutaneous TID WC  . metroNIDAZOLE  500 mg Intravenous Once  . metronidazole  500 mg Intravenous Q8H  . potassium chloride  30 mEq Oral Q4H  . venlafaxine XR  75 mg Oral Q breakfast   Continuous Infusions: . dextrose 5 % and 0.9% NaCl 100 mL/hr at 10/02/15 0544    Principal Problem:   Diverticulitis large intestine Active  Problems:   Morbid obesity (HCC)   Prediabetes   GAD (generalized anxiety disorder)    Time spent: 35 minutes.    Marcellus Scott, MD, FACP, FHM. [facility] Pager [phone]  If 7PM-7AM, please contact night-coverage [email or web address] Password [email or web address] 10/02/2015, 9:56 AM    LOS: 1 day

## 2015-10-03 ENCOUNTER — Telehealth: Payer: Self-pay | Admitting: Family Medicine

## 2015-10-03 DIAGNOSIS — K5730 Diverticulosis of large intestine without perforation or abscess without bleeding: Secondary | ICD-10-CM

## 2015-10-03 DIAGNOSIS — R7303 Prediabetes: Secondary | ICD-10-CM

## 2015-10-03 LAB — CBC
HCT: 33.1 % — ABNORMAL LOW (ref 36.0–46.0)
Hemoglobin: 10.9 g/dL — ABNORMAL LOW (ref 12.0–15.0)
MCH: 30.2 pg (ref 26.0–34.0)
MCHC: 32.9 g/dL (ref 30.0–36.0)
MCV: 91.7 fL (ref 78.0–100.0)
PLATELETS: 353 10*3/uL (ref 150–400)
RBC: 3.61 MIL/uL — ABNORMAL LOW (ref 3.87–5.11)
RDW: 13.5 % (ref 11.5–15.5)
WBC: 7.7 10*3/uL (ref 4.0–10.5)

## 2015-10-03 LAB — BASIC METABOLIC PANEL
Anion gap: 5 (ref 5–15)
BUN: 6 mg/dL (ref 6–20)
CALCIUM: 8 mg/dL — AB (ref 8.9–10.3)
CO2: 26 mmol/L (ref 22–32)
CREATININE: 0.6 mg/dL (ref 0.44–1.00)
Chloride: 110 mmol/L (ref 101–111)
GFR calc Af Amer: >60 mL/min (ref >60)
GLUCOSE: 125 mg/dL — AB (ref 65–99)
Potassium: 3.6 mmol/L (ref 3.5–5.1)
SODIUM: 141 mmol/L (ref 135–145)

## 2015-10-03 LAB — GLUCOSE, CAPILLARY
Glucose-Capillary: 108 mg/dL — ABNORMAL HIGH (ref 65–99)
Glucose-Capillary: 86 mg/dL (ref 65–99)

## 2015-10-03 MED ORDER — METRONIDAZOLE 500 MG PO TABS: 500.0000 mg | ORAL_TABLET | Freq: Three times a day (TID) | ORAL | Status: DC

## 2015-10-03 MED ORDER — OXYMETAZOLINE HCL 0.05 % NA SOLN: 1.0000 | Freq: Two times a day (BID) | NASAL | Status: DC | PRN

## 2015-10-03 MED ORDER — IBUPROFEN 200 MG PO TABS: 600.0000 mg | ORAL_TABLET | Freq: Three times a day (TID) | ORAL | Status: DC | PRN

## 2015-10-03 MED ORDER — CIPROFLOXACIN HCL 500 MG PO TABS: 500.0000 mg | ORAL_TABLET | Freq: Two times a day (BID) | ORAL | Status: DC

## 2015-10-03 NOTE — Care Management Note (Signed)
Case Management Note  Patient Details  Name: Jennifer [name] MRN: [id number] Date of Birth: 13-Jan-1976  Subjective/Objective:                    Action/Plan:   Expected Discharge Date:                  Expected Discharge Plan:  Home/Self Care  In-House Referral:  NA  Discharge planning Services  CM Consult  Post Acute Care Choice:  NA Choice offered to:  NA  DME Arranged:    DME Agency:     HH Arranged:    [place] Agency:     Status of Service:  Completed, signed off  Medicare Important Message Given:    Date Medicare IM Given:    Medicare IM give by:    Date Additional Medicare IM Given:    Additional Medicare Important Message give by:     If discussed at [place] of Stay Meetings, dates discussed:    Additional Comments: Pt discharged home today. Pt given information on the  [facility] program to assist with medications. Jennifer [name] [place], RN 10/03/2015, 12:52 PM

## 2015-10-03 NOTE — Discharge Instructions (Signed)
Diverticulitis °Diverticulitis is inflammation or infection of small pouches in your colon that form when you have a condition called diverticulosis. The pouches in your colon are called diverticula. Your colon, or large intestine, is where water is absorbed and stool is formed. °Complications of diverticulitis can include: °· Bleeding. °· Severe infection. °· Severe pain. °· Perforation of your colon. °· Obstruction of your colon. °CAUSES  °Diverticulitis is caused by bacteria. °Diverticulitis happens when stool becomes trapped in diverticula. This allows bacteria to grow in the diverticula, which can lead to inflammation and infection. °RISK FACTORS °People with diverticulosis are at risk for diverticulitis. Eating a diet that does not include enough fiber from fruits and vegetables may make diverticulitis more likely to develop. °SYMPTOMS  °Symptoms of diverticulitis may include: °· Abdominal pain and tenderness. The pain is normally located on the left side of the abdomen, but may occur in other areas. °· Fever and chills. °· Bloating. °· Cramping. °· Nausea. °· Vomiting. °· Constipation. °· Diarrhea. °· Blood in your stool. °DIAGNOSIS  °Your health care provider will ask you about your medical history and do a physical exam. You may need to have tests done because many medical conditions can cause the same symptoms as diverticulitis. Tests may include: °· Blood tests. °· Urine tests. °· Imaging tests of the abdomen, including X-rays and CT scans. °When your condition is under control, your health care provider may recommend that you have a colonoscopy. A colonoscopy can show how severe your diverticula are and whether something else is causing your symptoms. °TREATMENT  °Most cases of diverticulitis are mild and can be treated at home. Treatment may include: °· Taking over-the-counter pain medicines. °· Following a clear liquid diet. °· Taking antibiotic medicines by mouth for 7-10 days. °More severe cases may  be treated at a hospital. Treatment may include: °· Not eating or drinking. °· Taking prescription pain medicine. °· Receiving antibiotic medicines through an IV tube. °· Receiving fluids and nutrition through an IV tube. °· Surgery. °HOME CARE INSTRUCTIONS  °· Follow your health care provider's instructions carefully. °· Follow a full liquid diet or other diet as directed by your health care provider. After your symptoms improve, your health care provider may tell you to change your diet. He or she may recommend you eat a high-fiber diet. Fruits and vegetables are good sources of fiber. Fiber makes it easier to pass stool. °· Take fiber supplements or probiotics as directed by your health care provider. °· Only take medicines as directed by your health care provider. °· Keep all your follow-up appointments. °SEEK MEDICAL CARE IF:  °· Your pain does not improve. °· You have a hard time eating food. °· Your bowel movements do not return to normal. °SEEK IMMEDIATE MEDICAL CARE IF:  °· Your pain becomes worse. °· Your symptoms do not get better. °· Your symptoms suddenly get worse. °· You have a fever. °· You have repeated vomiting. °· You have bloody or black, tarry stools. °MAKE SURE YOU:  °· Understand these instructions. °· Will watch your condition. °· Will get help right away if you are not doing well or get worse. °  °This information is not intended to replace advice given to you by your health care provider. Make sure you discuss any questions you have with your health care provider. °  °Document Released: 08/11/2005 Document Revised: 11/06/2013 Document Reviewed: 09/26/2013 °Elsevier Interactive Patient Education ©2016 [employer] Inc[employer] ° °

## 2015-10-03 NOTE — Discharge Summary (Signed)
Physician Discharge Summary  Jennifer Jennifer [name] GUY:[id number] DOB: 08/14/1976 DOA: 09/30/2015  PCP: Syliva Overman, MD  Admit date: 09/30/2015 Discharge date: 10/03/2015  Time spent: Greater than 30 minutes  Recommendations for Outpatient Follow-up:  1. Dr. Syliva Overman, PCP in 5 days with repeat labs (CBC).  Discharge Diagnoses:  Principal Problem:   Diverticulitis large intestine Active Problems:   Morbid obesity (HCC)   Prediabetes   GAD (generalized anxiety disorder)   Discharge Condition: Improved & Stable  Diet recommendation: Heart healthy and diabetic diet.  Filed Weights   09/30/15 1622 10/01/15 0003  Weight: 142.883 kg (315 lb) 128.504 kg (283 lb 4.8 oz)    History of present illness:  39 y.o. Jennifer [name] with multiple medical problems, including morbid obesity, recurrent UTIs, depression, alopecia, borderline DM on Metformin, hx of diverticulosis with prior diverticulitis, presented to the ER With 3 days hx of right lower quadrant pain.Evaluation in the ER included anAbdominal pelvic CT which showed diverticulitis with perforation and possible early peritonitis and early abscess formation.She has a leukocytosis with WBC of 14K, and normal renal fx test. Her K was low at 3.3 mEq/L, and her pregnancy test was negative.  Hospital Course:   Acute sigmoid diverticulitis with perforation & early abscess with ileus - Treated supportively with bowel rest, IV fluids, IV antibiotics (Cipro & Flagyl) and pain management. - Patient stated that she had a colonoscopy approximately 2 years ago. May need repeat colonoscopy after current acute phase has completely resolved. - General surgery consulted and followed. Diet was gradually advanced which she has tolerated without any further abdominal pain. No nausea or vomiting. She has some diarrhea but that is improving. Surgery has cleared her for discharge home and recommend additional 10 days of antibiotics. Ileus resolved. - Patient  was advised to seek immediate medical attention if there was any decline in her medical condition and she verbalized understanding.  Hypokalemia - Replaced  Headache - Unclear etiology.  - ? Sinusitis-patient has nasal stuffiness and postnasal drip. Continue antibiotics as below.  - Added Afrin and ibuprofen with significant improvement. Patient states that her headache is almost resolved. Advised her that she can continue these when necessary for the next 2-3 days.  Morbid obesity - will need dietary modification, exercise and weight loss. Dietitian input appreciated.  Type II DM, Controlled. - Monitor CBGs and continue SSI. Controlled. Resume metformin at discharge.  Depression - Continue venlafaxine and BuSpar. No suicidal or homicidal ideations.  Anemia  - Stable over the last 24 hours. Follow CBC during outpatient follow-up with PCP.     Consultants:  General surgery  Procedures:  None  Antibiotics:  IV Cipro 11/15 >  IV Flagyl 11/15 >  Discharge Exam:  Complaints:  feels much better. No abdominal pain. Tolerating diet without nausea or vomiting. Diarrhea frequency decreasing and consistency improving. Headache has almost resolved.   Filed Vitals:   10/02/15 0556 10/02/15 1506 10/02/15 2220 10/03/15 0524  BP: 113/66 118/71 108/68 107/51  Pulse: 103 87 88 86  Temp: 98.3 F (36.8 C) 97.9 F (36.6 C) 97.2 F (36.2 C) 98.2 F (36.8 C)  TempSrc: Oral Oral Oral Oral  Resp: 18 18 19 18   Height:      Weight:      SpO2: 97% 97% 100% 96%    General exam: Moderately built and morbidly obese pleasant young Jennifer [name] sitting up comfortably in bed. Just returned from the bathroom.  Respiratory system: Clear. No increased work of breathing. Cardiovascular system: S1 &  S2 heard, RRR. No JVD, murmurs, gallops, clicks or pedal edema. Gastrointestinal system: Abdomen is nondistended, soft and nontender. Normal bowel sounds heard. Central nervous system: Alert and  oriented. No focal neurological deficits. Extremities: Symmetric 5 x 5 power.  Discharge Instructions      Discharge Instructions    Call MD for:  difficulty breathing, headache or visual disturbances    Complete by:  As directed      Call MD for:  extreme fatigue    Complete by:  As directed      Call MD for:  persistant dizziness or light-headedness    Complete by:  As directed      Call MD for:  persistant nausea and vomiting    Complete by:  As directed      Call MD for:  severe uncontrolled pain    Complete by:  As directed      Call MD for:  temperature >100.4    Complete by:  As directed      Diet - low sodium heart healthy    Complete by:  As directed      Diet Carb Modified    Complete by:  As directed      Increase activity slowly    Complete by:  As directed             Medication List    TAKE these medications        acetaminophen 500 MG tablet  Commonly known as:  TYLENOL  Take 500 mg by mouth every 6 (six) hours as needed for mild pain or moderate pain.     busPIRone 7.5 MG tablet  Commonly known as:  BUSPAR  Take 1 tablet (7.5 mg total) by mouth 3 (three) times daily.     ciprofloxacin 500 MG tablet  Commonly known as:  CIPRO  Take 1 tablet (500 mg total) by mouth 2 (two) times daily.     ibuprofen 200 MG tablet  Commonly known as:  ADVIL,MOTRIN  Take 3 tablets (600 mg total) by mouth every 8 (eight) hours as needed for headache.     metFORMIN 500 MG tablet  Commonly known as:  GLUCOPHAGE  TAKE 1 TABLET (500 MG TOTAL) BY MOUTH 3 (THREE) TIMES DAILY.     metroNIDAZOLE 500 MG tablet  Commonly known as:  FLAGYL  Take 1 tablet (500 mg total) by mouth 3 (three) times daily.     oxymetazoline 0.05 % nasal spray  Commonly known as:  AFRIN  Place 1 spray into both nostrils 2 (two) times daily as needed for congestion.     venlafaxine XR 75 MG 24 hr capsule  Commonly known as:  EFFEXOR-XR  Take 1 capsule (75 mg total) by mouth daily with  breakfast.       Follow-up Information    Follow up with Syliva Overman, MD. Schedule an appointment as soon as possible for a visit in 5 days.   Specialty:  Family Medicine   Why:  to be seen with repeat labs (CBC).   Contact information:   [address], [address] [place] [address] [phone]        The results of significant diagnostics from this hospitalization (including imaging, microbiology, ancillary and laboratory) are listed below for reference.    Significant Diagnostic Studies: Ct Abdomen Pelvis W Contrast  09/30/2015  CLINICAL DATA:  Lower abdominal pain and diarrhea 4 days. EXAM: CT ABDOMEN AND PELVIS WITH CONTRAST TECHNIQUE: Multidetector CT imaging  of the abdomen and pelvis was performed using the standard protocol following bolus administration of intravenous contrast. CONTRAST:  OMNIPAQUE IOHEXOL 300 MG/ML  SOLN COMPARISON:  None. FINDINGS: Lung bases demonstrate linear atelectasis over the right base. Abdominal images demonstrate mild diffuse low-attenuation of the liver without focal mass. The spleen, pancreas, gallbladder and adrenal glands are within normal. Stomach is normal. Kidneys an ureters are within normal. Surgical absence of the appendix. Vascular structures are within normal. There is diverticulosis of the colon with inflammatory change adjacent the distal descending and sigmoid colon. This likely represents acute diverticulitis with perforation. There is a collection of extraluminal air medially anterior to the inflamed diverticula of the sigmoid colon. This may be a developing diverticular abscess. There are a few small foci of free peritoneal air higher within the left abdomen. There is significant wall thickening of several adjacent small bowel loops in the left mid abdomen slightly higher than the diverticular disease. Possible mild pneumatosis of several of these thick walled small bowel loops. Minimal dilatation of several small bowel loops  proximal to this region. These findings are likely due to secondary inflammatory change and ileus. There is patchy mesenteric fluid. No mesenteric venous gas. A primary small bowel inflammatory process is possible but less likely. Pelvic images demonstrate IUD in adequate position. Small uterine calcified fibroid. Ovaries, bladder and rectum are normal. Remaining bones and soft tissues are within normal. IMPRESSION: Inflammatory process in the left mid to lower abdomen likely due to an acute diverticulitis of the sigmoid colon with perforation. Resulting peritonitis with secondary inflammation of several adjacent small bowel loops and focal ileus. Possible developing diverticular abscess immediately anterior to the inflamed sigmoid colon. Right basilar atelectasis. Critical Value/emergent results were called by telephone at the time of interpretation on 09/30/2015 at 9:58 pm to Dr. Vanetta Mulders , who verbally acknowledged these results. Electronically Signed   By: Elberta Fortis M.D.   On: 09/30/2015 21:58    Microbiology: No results found for this or any previous visit (from the past 240 hour(s)).   Labs: Basic Metabolic Panel:  Recent Labs Lab 09/30/15 1631 10/01/15 0653 10/02/15 0632 10/03/15 0702  NA 138 140 136 141  K 3.3* 3.5 3.1* 3.6  CL 100* 105 104 110  CO2 27 26 25 26   GLUCOSE 115* 114* 138* 125*  BUN 6 6 5* 6  CREATININE 0.80 0.67 0.57 0.60  CALCIUM 8.9 8.2* 7.9* 8.0*  MG  --   --  1.8  --    Liver Function Tests:  Recent Labs Lab 09/30/15 1631  AST 31  ALT 29  ALKPHOS 121  BILITOT 0.7  PROT 8.4*  ALBUMIN 4.1    Recent Labs Lab 09/30/15 1631  LIPASE 21  AMYLASE 32   No results for input(s): AMMONIA in the last 168 hours. CBC:  Recent Labs Lab 09/30/15 1631 10/01/15 0653 10/02/15 0632 10/03/15 0702  WBC 14.1* 12.3* 10.8* 7.7  NEUTROABS 12.1*  --   --   --   HGB 14.4 12.5 11.3* 10.9*  HCT 42.4 37.7 34.2* 33.1*  MCV 90.6 91.1 91.4 91.7  PLT 344 357  341 353   Cardiac Enzymes: No results for input(s): CKTOTAL, CKMB, CKMBINDEX, TROPONINI in the last 168 hours. BNP: BNP (last 3 results) No results for input(s): BNP in the last 8760 hours.  ProBNP (last 3 results) No results for input(s): PROBNP in the last 8760 hours.  CBG:  Recent Labs Lab 10/02/15 1105 10/02/15 1630 10/02/15  2032 10/03/15 0712 10/03/15 1104  GLUCAP 96 95 142* 108* 86     Additional labs: 1. Urine pregnancy test: Negative.   Signed:  Marcellus Scott, MD, FACP, FHM. [facility] Pager [phone]  If 7PM-7AM, please contact night-coverage [email or web address] Password [facility] 10/03/2015, 12:38 PM

## 2015-10-03 NOTE — Progress Notes (Signed)
  Subjective: Patient feels fine and has no significant abdominal pain. She is tolerating soft diet well. She has had multiple bowel movements.  Objective: Vital signs in last 24 hours: Temp:  [97.2 F (36.2 C)-98.2 F (36.8 C)] 98.2 F (36.8 C) (11/18 0524) Pulse Rate:  [86-88] 86 (11/18 0524) Resp:  [18-19] 18 (11/18 0524) BP: (107-118)/(51-71) 107/51 mmHg (11/18 0524) SpO2:  [96 %-100 %] 96 % (11/18 0524) Last BM Date: 10/02/15  Intake/Output from previous day: 11/17 0701 - 11/18 0700 In: 480 [P.O.:480] Out: -  Intake/Output this shift:    General appearance: alert, cooperative and no distress GI: soft, non-tender; bowel sounds normal; no masses,  no organomegaly  Lab Results:   Recent Labs  10/02/15 0632 10/03/15 0702  WBC 10.8* 7.7  HGB 11.3* 10.9*  HCT 34.2* 33.1*  PLT 341 353   BMET  Recent Labs  10/02/15 0632 10/03/15 0702  NA 136 141  K 3.1* 3.6  CL 104 110  CO2 25 26  GLUCOSE 138* 125*  BUN 5* 6  CREATININE 0.57 0.60  CALCIUM 7.9* 8.0*   PT/INR No results for input(s): LABPROT, INR in the last 72 hours.  Studies/Results: No results found.  Anti-infectives: Anti-infectives    Start     Dose/Rate Route Frequency Ordered Stop   10/01/15 1000  ciprofloxacin (CIPRO) IVPB 400 mg     400 mg 200 mL/hr over 60 Minutes Intravenous Every 12 hours 10/01/15 0004     10/01/15 0004  metroNIDAZOLE (FLAGYL) IVPB 500 mg     500 mg 100 mL/hr over 60 Minutes Intravenous Every 8 hours 10/01/15 0004 10/11/15 0014   09/30/15 2315  ciprofloxacin (CIPRO) IVPB 400 mg     400 mg 200 mL/hr over 60 Minutes Intravenous  Once 09/30/15 2302 10/01/15 0018   09/30/15 2315  metroNIDAZOLE (FLAGYL) IVPB 500 mg     500 mg 100 mL/hr over 60 Minutes Intravenous  Once 09/30/15 2302        Assessment/Plan: Impression: Sigmoid diverticulitis with microperforation, resolving Plan: Okay for discharge from surgery standpoint. I would continue by mouth ciprofloxacin and  Flagyl for 10 more days. Patient may follow-up with primary care doctor. No need for surgical intervention. We'll sign off.  LOS: 2 days    Maelyn Berrey A 10/03/2015

## 2015-10-03 NOTE — Telephone Encounter (Signed)
Pls contact pt and order CBC to be drawn next week Tuesday per her d/c summary today she needs this. If her WBC is higher than  today's  Or hemoglobin lower , it will need to be reviewed by covering clinician also with specific directions as to what needs to be done. She needs a hospital follow up  with me in 2  To 2.5 weeks scheduled also weeks scheduled also pls

## 2015-10-06 NOTE — Addendum Note (Signed)
Addended by: Eual Fines on: 10/06/2015 08:44 AM   Modules accepted: Orders

## 2015-10-06 NOTE — Telephone Encounter (Signed)
Scheduled and lab ordered to be done tuesday Nov 29

## 2015-10-08 ENCOUNTER — Telehealth: Payer: Self-pay | Admitting: Family Medicine

## 2015-10-08 MED ORDER — FLUCONAZOLE 150 MG PO TABS: ORAL_TABLET | ORAL | Status: DC

## 2015-10-08 NOTE — Telephone Encounter (Signed)
Reminded patient to have labs done on Tuesday 11/29

## 2015-10-08 NOTE — Telephone Encounter (Signed)
Patient states that when she went to the hospital last week her urine was dark, her urine is still dark, she states that the antibiotic shes taking Ciprofloxan 500mg  and Metronidazole 500mg  and she states she is starting to itch yesterday, please advise?

## 2015-10-08 NOTE — Telephone Encounter (Signed)
Spoke with patient and encouraged her to increase fluids so that urine is not as concentrated.  Will also send in [place] since patient is currently on abt from the hospital.

## 2015-10-15 LAB — CBC WITH DIFFERENTIAL/PLATELET
BASOS ABS: 0 10*3/uL (ref 0.0–0.1)
BASOS PCT: 0 % (ref 0–1)
EOS ABS: 0.1 10*3/uL (ref 0.0–0.7)
EOS PCT: 1 % (ref 0–5)
HCT: 37.3 % (ref 36.0–46.0)
Hemoglobin: 12.9 g/dL (ref 12.0–15.0)
Lymphocytes Relative: 40 % (ref 12–46)
Lymphs Abs: 3 10*3/uL (ref 0.7–4.0)
MCH: 30.2 pg (ref 26.0–34.0)
MCHC: 34.6 g/dL (ref 30.0–36.0)
MCV: 87.4 fL (ref 78.0–100.0)
MPV: 8.5 fL — AB (ref 8.6–12.4)
Monocytes Absolute: 0.4 10*3/uL (ref 0.1–1.0)
Monocytes Relative: 5 % (ref 3–12)
Neutro Abs: 4.1 10*3/uL (ref 1.7–7.7)
Neutrophils Relative %: 54 % (ref 43–77)
PLATELETS: 664 10*3/uL — AB (ref 150–400)
RBC: 4.27 MIL/uL (ref 3.87–5.11)
RDW: 14.5 % (ref 11.5–15.5)
WBC: 7.6 10*3/uL (ref 4.0–10.5)

## 2015-10-20 ENCOUNTER — Ambulatory Visit: Payer: 59 | Admitting: Family Medicine

## 2015-10-28 ENCOUNTER — Encounter: Payer: Self-pay | Admitting: Family Medicine

## 2015-10-28 ENCOUNTER — Ambulatory Visit (INDEPENDENT_AMBULATORY_CARE_PROVIDER_SITE_OTHER): Payer: 59 | Admitting: Family Medicine

## 2015-10-28 VITALS — BP 120/80 | HR 90 | Resp 16 | Ht 67.0 in | Wt 326.0 lb

## 2015-10-28 DIAGNOSIS — K648 Other hemorrhoids: Secondary | ICD-10-CM

## 2015-10-28 DIAGNOSIS — Z09 Encounter for follow-up examination after completed treatment for conditions other than malignant neoplasm: Secondary | ICD-10-CM | POA: Diagnosis not present

## 2015-10-28 DIAGNOSIS — K5731 Diverticulosis of large intestine without perforation or abscess with bleeding: Secondary | ICD-10-CM

## 2015-10-28 DIAGNOSIS — R7303 Prediabetes: Secondary | ICD-10-CM

## 2015-10-28 DIAGNOSIS — R5382 Chronic fatigue, unspecified: Secondary | ICD-10-CM | POA: Diagnosis not present

## 2015-10-28 DIAGNOSIS — Z13220 Encounter for screening for lipoid disorders: Secondary | ICD-10-CM | POA: Diagnosis not present

## 2015-10-28 DIAGNOSIS — F418 Other specified anxiety disorders: Secondary | ICD-10-CM

## 2015-10-28 LAB — CBC WITH DIFFERENTIAL/PLATELET
BASOS ABS: 0 10*3/uL (ref 0.0–0.1)
Basophils Relative: 0 % (ref 0–1)
EOS ABS: 0.2 10*3/uL (ref 0.0–0.7)
EOS PCT: 4 % (ref 0–5)
HEMATOCRIT: 38 % (ref 36.0–46.0)
Hemoglobin: 12.7 g/dL (ref 12.0–15.0)
LYMPHS ABS: 2.9 10*3/uL (ref 0.7–4.0)
LYMPHS PCT: 56 % — AB (ref 12–46)
MCH: 29.5 pg (ref 26.0–34.0)
MCHC: 33.4 g/dL (ref 30.0–36.0)
MCV: 88.4 fL (ref 78.0–100.0)
MONOS PCT: 7 % (ref 3–12)
MPV: 9.3 fL (ref 8.6–12.4)
Monocytes Absolute: 0.4 10*3/uL (ref 0.1–1.0)
Neutro Abs: 1.7 10*3/uL (ref 1.7–7.7)
Neutrophils Relative %: 33 % — ABNORMAL LOW (ref 43–77)
PLATELETS: 287 10*3/uL (ref 150–400)
RBC: 4.3 MIL/uL (ref 3.87–5.11)
RDW: 14.6 % (ref 11.5–15.5)
WBC: 5.2 10*3/uL (ref 4.0–10.5)

## 2015-10-28 LAB — LIPID PANEL
Cholesterol: 125 mg/dL (ref 125–200)
HDL: 50 mg/dL (ref >=46)
LDL CALC: 65 mg/dL (ref <130)
TRIGLYCERIDES: 48 mg/dL (ref <150)
Total CHOL/HDL Ratio: 2.5 Ratio (ref <=5.0)
VLDL: 10 mg/dL (ref <30)

## 2015-10-28 LAB — TSH: TSH: 1.271 u[IU]/mL (ref 0.350–4.500)

## 2015-10-28 NOTE — Patient Instructions (Addendum)
Annual physical exam in 4 month, call if you need me before  Call if you decide on therapy which I recommend  Please work on  changed eating hbits  Labs hBA1c, CBC, tSH and lipid panel today   Improved as far as diverticulitis is concerned  Diverticulosis Diverticulosis is the condition that develops when small pouches (diverticula) form in the wall of your colon. Your colon, or large intestine, is where water is absorbed and stool is formed. The pouches form when the inside layer of your colon pushes through weak spots in the outer layers of your colon. CAUSES  No one knows exactly what causes diverticulosis. RISK FACTORS  Being older than 79. Your risk for this condition increases with age. Diverticulosis is rare in people younger than 40 years. By age 44, almost everyone has it.  Eating a low-fiber diet.  Being frequently constipated.  Being overweight.  Not getting enough exercise.  Smoking.  Taking over-the-counter pain medicines, like aspirin and ibuprofen. SYMPTOMS  Most people with diverticulosis do not have symptoms. DIAGNOSIS  Because diverticulosis often has no symptoms, health care providers often discover the condition during an exam for other colon problems. In many cases, a health care provider will diagnose diverticulosis while using a flexible scope to examine the colon (colonoscopy). TREATMENT  If you have never developed an infection related to diverticulosis, you may not need treatment. If you have had an infection before, treatment may include:  Eating more fruits, vegetables, and grains.  Taking a fiber supplement.  Taking a live bacteria supplement (probiotic).  Taking medicine to relax your colon. HOME CARE INSTRUCTIONS   Drink at least 6-8 glasses of water each day to prevent constipation.  Try not to strain when you have a bowel movement.  Keep all follow-up appointments. If you have had an infection before:  Increase the fiber in your  diet as directed by your health care provider or dietitian.  Take a dietary fiber supplement if your health care provider approves.  Only take medicines as directed by your health care provider. SEEK MEDICAL CARE IF:   You have abdominal pain.  You have bloating.  You have cramps.  You have not gone to the bathroom in 3 days. SEEK IMMEDIATE MEDICAL CARE IF:   Your pain gets worse.  Yourbloating becomes very bad.  You have a fever or chills, and your symptoms suddenly get worse.  You begin vomiting.  You have bowel movements that are bloody or black. MAKE SURE YOU:  Understand these instructions.  Will watch your condition.  Will get help right away if you are not doing well or get worse.   This information is not intended to replace advice given to you by your health care provider. Make sure you discuss any questions you have with your health care provider.   Document Released: 07/29/2004 Document Revised: 11/06/2013 Document Reviewed: 09/26/2013 Elsevier Interactive Patient Education 2016 [employer].   Use bactroban ointment sparingly twice daily to area of skin breakdown on buttock for 5 to 7 days, keep  Clean and dry, do NOT rUB and irritate skin, use pure vaseline as a skin barrier to help promote healing

## 2015-10-28 NOTE — Progress Notes (Signed)
Subjective:    Patient ID: Jennifer [name], female    DOB: 1976/01/12, 39 y.o.   MRN: [id number]  HPI Hospitalized with acute diverticulitis with abcess , no perf , 11/15 to 11/18, here for follow up, no abdominal pian, and bowels regular.No blood in stool States ahd problem with piles , and now has rash in area   Review of Systems See HPI Denies recent fever or chills. Denies sinus pressure, nasal congestion, ear pain or sore throat. Denies chest congestion, productive cough or wheezing. Denies chest pains, palpitations and leg swelling Denies abdominal pain, nausea, vomiting,diarrhea or constipation.   Denies dysuria, frequency, hesitancy or incontinence. Denies joint pain, swelling and limitation in mobility. Denies headaches, seizures, numbness, or tingling. Denies uncontrolled  depression, anxiety or insomnia.          Objective:   Physical Exam BP 120/80 mmHg  Pulse 90  Resp 16  Ht 5\' 7"  (1.702 m)  Wt 326 lb (147.873 kg)  BMI 51.05 kg/m2  SpO2 96% Patient alert and oriented and in no cardiopulmonary distress.  HEENT: No facial asymmetry, EOMI,   oropharynx pink and moist.  Neck supple no JVD, no mass.  Chest: Clear to auscultation bilaterally.  CVS: S1, S2 no murmurs, no S3.Regular rate.  ABD: Soft non tender. Normal BS, no organomegaly or mass Rectal: perianal area erythematous slight skin breakdown noted also, no purulent drainage.  Ext: No edema  MS: Adequate ROM spine, shoulders, hips and knees.  Skin: Intact, no ulcerations or rash noted.  Psych: Good eye contact, normal affect. Memory intact not anxious mildly  depressed appearing.  CNS: CN 2-12 intact, power,  normal throughout.no focal deficits noted.        Assessment & Plan:  Hospital discharge follow-up Improved significantly , hospitalized with localized/ contained perforation from acute diverticulitis, reviewed with pt general foods to avoid and info on diverticulosis, also warning  symptoms of recurrence.  Morbid obesity (HCC) Deteriorated. Patient re-educated about  the importance of commitment to a  minimum of 150 minutes of exercise per week.  The importance of healthy food choices with portion control discussed. Encouraged to start a food diary, count calories and to consider  joining a support group. Sample diet sheets offered. Goals set by the patient for the next several months.   Weight /BMI 10/28/2015 10/01/2015 09/30/2015  WEIGHT 326 lb 283 lb 4.8 oz -  HEIGHT 5\' 7"  5\' 7"  -  BMI 51.05 kg/m2 - 44.36 kg/m2    Current exercise per week 60 minutes.   Hemorrhoids Current flare due to excessive stool, slight superinfection perianally due to excessive friction. Educated re need to use moist towelettes , avoid excess friction, vaseline as skin barrier, and short term topical antibiotic ointment  Depression with anxiety Improved on medication, but pt would benefit from therapy, still resists this , continue meds, not suicidal or homicidal  Prediabetes Patient educated about the importance of limiting  Carbohydrate intake , the need to commit to daily physical activity for a minimum of 30 minutes , and to commit weight loss. The fact that changes in all these areas will reduce or eliminate all together the development of diabetes is stressed.  Improved, currently on metformin, continue same  Diabetic Labs Latest Ref Rng 10/28/2015 10/03/2015 10/02/2015 10/01/2015 09/30/2015  HbA1c <5.7 % 6.1(H) - - - -  Chol 125 - 200 mg/dL 409 - - - -  HDL >=81 mg/dL 50 - - - -  Calc LDL <191 mg/dL  65 - - - -  Triglycerides <150 mg/dL 48 - - - -  Creatinine 0.44 - 1.00 mg/dL - 1.61 0.96 0.45 4.09   BP/Weight 10/28/2015 10/03/2015 10/01/2015 09/30/2015 04/03/2015 03/26/2015 12/18/2014  Systolic BP 120 110 - - 140 142 120  Diastolic BP 80 64 - - 92 91 82  Wt. (Lbs) 326 - 283.3 - 316.8 315.4 311  BMI 51.05 - - 44.36 49.61 49.39 48.7   No flowsheet data  found.

## 2015-10-29 LAB — HEMOGLOBIN A1C
Hgb A1c MFr Bld: 6.1 % — ABNORMAL HIGH (ref <5.7)
Mean Plasma Glucose: 128 mg/dL — ABNORMAL HIGH (ref <117)

## 2015-11-03 ENCOUNTER — Ambulatory Visit: Payer: 59 | Admitting: Family Medicine

## 2015-11-05 ENCOUNTER — Other Ambulatory Visit: Payer: Self-pay | Admitting: Family Medicine

## 2015-11-23 DIAGNOSIS — Z09 Encounter for follow-up examination after completed treatment for conditions other than malignant neoplasm: Secondary | ICD-10-CM | POA: Insufficient documentation

## 2015-11-23 DIAGNOSIS — K5790 Diverticulosis of intestine, part unspecified, without perforation or abscess without bleeding: Secondary | ICD-10-CM | POA: Insufficient documentation

## 2015-11-23 NOTE — Assessment & Plan Note (Signed)
Current flare due to excessive stool, slight superinfection perianally due to excessive friction. Educated re need to use moist towelettes , avoid excess friction, vaseline as skin barrier, and short term topical antibiotic ointment

## 2015-11-23 NOTE — Assessment & Plan Note (Signed)
Patient educated about the importance of limiting  Carbohydrate intake , the need to commit to daily physical activity for a minimum of 30 minutes , and to commit weight loss. The fact that changes in all these areas will reduce or eliminate all together the development of diabetes is stressed.  Improved, currently on metformin, continue same  Diabetic Labs Latest Ref Rng 10/28/2015 10/03/2015 10/02/2015 10/01/2015 09/30/2015  HbA1c <5.7 % 6.1(H) - - - -  Chol 125 - 200 mg/dL 125 - - - -  HDL >=46 mg/dL 50 - - - -  Calc LDL <130 mg/dL 65 - - - -  Triglycerides <150 mg/dL 48 - - - -  Creatinine 0.44 - 1.00 mg/dL - 0.60 0.57 0.67 0.80   BP/Weight 10/28/2015 10/03/2015 10/01/2015 09/30/2015 04/03/2015 [id number]  Systolic BP 123456 [id number] - - [id number] 123456  Diastolic BP 80 64 - - 92 91 82  Wt. (Lbs) 326 - 283.3 - 316.8 315.4 311  BMI 51.05 - - 44.36 49.61 49.39 48.7   No flowsheet data found.

## 2015-11-23 NOTE — Assessment & Plan Note (Signed)
Improved on medication, but pt would benefit from therapy, still resists this , continue meds, not suicidal or homicidal

## 2015-11-23 NOTE — Assessment & Plan Note (Signed)
Deteriorated. Patient re-educated about  the importance of commitment to a  minimum of 150 minutes of exercise per week.  The importance of healthy food choices with portion control discussed. Encouraged to start a food diary, count calories and to consider  joining a support group. Sample diet sheets offered. Goals set by the patient for the next several months.   Weight /BMI 10/28/2015 10/01/2015 09/30/2015  WEIGHT 326 lb 283 lb 4.8 oz -  HEIGHT 5\' 7"  5\' 7"  -  BMI 51.05 kg/m2 - 44.36 kg/m2    Current exercise per week 60 minutes.

## 2015-11-23 NOTE — Assessment & Plan Note (Addendum)
Improved significantly , hospitalized with localized/ contained perforation from acute diverticulitis, reviewed with pt general foods to avoid and info on diverticulosis, also warning symptoms of recurrence.

## 2016-01-06 ENCOUNTER — Telehealth: Payer: Self-pay | Admitting: Family Medicine

## 2016-01-06 NOTE — Telephone Encounter (Signed)
Patient thinks that she possibly has a UTI, using AZO and drinking cranberry juice but that's not working and she thinks she needs something stronger,please advise?

## 2016-01-07 NOTE — Telephone Encounter (Signed)
Patient returned call.  She is unable to come in today for nurse visit.  She will call back.

## 2016-01-09 ENCOUNTER — Ambulatory Visit (INDEPENDENT_AMBULATORY_CARE_PROVIDER_SITE_OTHER): Payer: BLUE CROSS/BLUE SHIELD

## 2016-01-09 DIAGNOSIS — N3001 Acute cystitis with hematuria: Secondary | ICD-10-CM

## 2016-01-09 LAB — POCT URINALYSIS DIPSTICK
BILIRUBIN UA: NEGATIVE
Glucose, UA: NEGATIVE
KETONES UA: NEGATIVE
Nitrite, UA: NEGATIVE
PH UA: 6
PROTEIN UA: NEGATIVE
SPEC GRAV UA: 1.01
Urobilinogen, UA: 0.2

## 2016-01-09 MED ORDER — CIPROFLOXACIN HCL 500 MG PO TABS: 500.0000 mg | ORAL_TABLET | Freq: Two times a day (BID) | ORAL | Status: DC

## 2016-01-09 NOTE — Telephone Encounter (Signed)
Patient in for nurse visit  

## 2016-01-12 LAB — URINE CULTURE: Colony Count: >=100000

## 2016-01-25 ENCOUNTER — Other Ambulatory Visit: Payer: Self-pay | Admitting: Family Medicine

## 2016-02-26 ENCOUNTER — Encounter: Payer: 59 | Admitting: Family Medicine

## 2016-02-26 ENCOUNTER — Encounter: Payer: Self-pay | Admitting: Family Medicine

## 2016-03-22 ENCOUNTER — Encounter: Payer: Self-pay | Admitting: Family Medicine

## 2016-03-22 ENCOUNTER — Ambulatory Visit (INDEPENDENT_AMBULATORY_CARE_PROVIDER_SITE_OTHER): Payer: BLUE CROSS/BLUE SHIELD | Admitting: Family Medicine

## 2016-03-22 VITALS — BP 136/86 | HR 96 | Resp 18 | Ht 67.0 in | Wt 314.0 lb

## 2016-03-22 DIAGNOSIS — W19XXXD Unspecified fall, subsequent encounter: Secondary | ICD-10-CM

## 2016-03-22 DIAGNOSIS — M79671 Pain in right foot: Secondary | ICD-10-CM

## 2016-03-22 DIAGNOSIS — R7303 Prediabetes: Secondary | ICD-10-CM | POA: Diagnosis not present

## 2016-03-22 DIAGNOSIS — I10 Essential (primary) hypertension: Secondary | ICD-10-CM

## 2016-03-22 DIAGNOSIS — R928 Other abnormal and inconclusive findings on diagnostic imaging of breast: Secondary | ICD-10-CM

## 2016-03-22 DIAGNOSIS — R7309 Other abnormal glucose: Secondary | ICD-10-CM | POA: Diagnosis not present

## 2016-03-22 DIAGNOSIS — M25571 Pain in right ankle and joints of right foot: Secondary | ICD-10-CM

## 2016-03-22 DIAGNOSIS — F418 Other specified anxiety disorders: Secondary | ICD-10-CM

## 2016-03-22 DIAGNOSIS — M25561 Pain in right knee: Secondary | ICD-10-CM

## 2016-03-22 MED ORDER — HYDROCHLOROTHIAZIDE 12.5 MG PO CAPS: 12.5000 mg | ORAL_CAPSULE | Freq: Every day | ORAL | Status: DC

## 2016-03-22 NOTE — Patient Instructions (Addendum)
F/u in 4 month, call if you need me before  New medication , HCTZ 12.5 mg one daily for blood pressure. Ensure you eat a lot of fresh/frozen vegetable and fruit to help blood pressure, and not much salt  WORK on weight loss.  You are given contact info to look into surgery which I belive is in your best interest for weigfht management  NEED MAMMOGRAM, PAST DUE NEED TO GO!!  You are referred to orthopedics re pain in right foot, ankle and knee following recent fall  Thanks for choosing [facility], we consider it a privelige to serve you. Labs today

## 2016-03-22 NOTE — Progress Notes (Signed)
Subjective:    Patient ID: Jennifer [name], female    DOB: [birth year], 40 y.o.   MRN: [id number]  HPI    Jennifer [name]     MRN: [id number]      DOB: [birth year]   HPI Jennifer [name] is here for follow up and re-evaluation of chronic medical conditions, medication management and review of any available recent lab and radiology data.  Preventive health is updated, specifically  Cancer screening and Immunization.   Questions or concerns regarding consultations or procedures which the PT has had in the interim are  addressed. The PT denies any adverse reactions to current medications since the last visit.  C/o elevated and uncontrolled BP when checked at the pharmacy C/o increased and uncontrolled right foot and ankle pain following fall 1 month ago, also right knee pain  ROS Denies recent fever or chills. Denies sinus pressure, nasal congestion, ear pain or sore throat. Denies chest congestion, productive cough or wheezing. Denies chest pains, palpitations and leg swelling Denies abdominal pain, nausea, vomiting,diarrhea or constipation.   Denies dysuria, frequency, hesitancy or incontinence. Denies headaches, seizures, numbness, or tingling. Denies depression, anxiety or insomnia. Denies skin break down or rash.   PE  BP 136/86 mmHg  Pulse 96  Resp 18  Ht 5\' 7"  (1.702 m)  Wt 314 lb (142.429 kg)  BMI 49.17 kg/m2  SpO2 98%  Patient alert and oriented and in no cardiopulmonary distress.  HEENT: No facial asymmetry, EOMI,   oropharynx pink and moist.  Neck supple no JVD, no mass.  Chest: Clear to auscultation bilaterally.  CVS: S1, S2 no murmurs, no S3.Regular rate.  ABD: Soft non tender.   Ext: No edema  MS: Adequate ROM spine, shoulders, hips and knees.Tender over right foot, with reduced ROM ankle due to pain Skin: Intact, no ulcerations or rash noted.  Psych: Good eye contact, normal affect. Memory intact not anxious or depressed appearing.  CNS: CN 2-12 intact,  power,  normal throughout.no focal deficits noted.   Assessment & Plan   Essential hypertension, benign Uncontrolled, add HCTZ DASH diet and commitment to daily physical activity for a minimum of 30 minutes discussed and encouraged, as a part of hypertension management. The importance of attaining a healthy weight is also discussed.  BP/Weight 03/22/2016 10/28/2015 10/03/2015 10/01/2015 09/30/2015 04/03/2015 03/26/2015  Systolic BP 136 120 110 - - 140 142  Diastolic BP 86 80 64 - - 92 91  Wt. (Lbs) 314 326 - 283.3 - 316.8 315.4  BMI 49.17 51.05 - - 44.36 49.61 49.39        Morbid obesity (HCC) Slightly improved. Patient re-educated about  the importance of commitment to a  minimum of 150 minutes of exercise per week.  The importance of healthy food choices with portion control discussed. Encouraged to start a food diary, count calories and to consider  joining a support group. Sample diet sheets offered. Goals set by the patient for the next several months.   Weight /BMI 03/22/2016 10/28/2015 10/01/2015  WEIGHT 314 lb 326 lb 283 lb 4.8 oz  HEIGHT 5\' 7"  5\' 7"  5\' 7"   BMI 49.17 kg/m2 51.05 kg/m2 -    Current exercise per week 60 minutes.   Prediabetes Patient educated about the importance of limiting  Carbohydrate intake , the need to commit to daily physical activity for a minimum of 30 minutes , and to commit weight loss. The fact that changes in all these areas will reduce or eliminate all together the  development of diabetes is stressed.  Slight improvement  Diabetic Labs Latest Ref Rng 03/22/2016 10/28/2015 10/03/2015 10/02/2015 10/01/2015  HbA1c <5.7 % 6.0(H) 6.1(H) - - -  Chol 125 - 200 mg/dL - 478 - - -  HDL >=29 mg/dL - 50 - - -  Calc LDL <562 mg/dL - 65 - - -  Triglycerides <150 mg/dL - 48 - - -  Creatinine 0.50 - 1.10 mg/dL 1.30 - 8.65 7.84 6.96   BP/Weight 03/22/2016 10/28/2015 10/03/2015 10/01/2015 09/30/2015 04/03/2015 03/26/2015  Systolic BP 136 120 110 - - 140  142  Diastolic BP 86 80 64 - - 92 91  Wt. (Lbs) 314 326 - 283.3 - 316.8 315.4  BMI 49.17 51.05 - - 44.36 49.61 49.39   No flowsheet data found.     Depression with anxiety Controlled, no change in medication   Fall H/o recent fall with subsequent right knee, ankle and foot pain, refer to ortho for further evaluation  Foot pain, right Pain and digfficulty with weight bearing 1 n month following a fall, refer to ortho for eval and management      Review of Systems     Objective:   Physical Exam        Assessment & Plan:

## 2016-03-23 ENCOUNTER — Encounter: Payer: Self-pay | Admitting: Family Medicine

## 2016-03-23 LAB — BASIC METABOLIC PANEL
BUN: 13 mg/dL (ref 7–25)
CHLORIDE: 101 mmol/L (ref 98–110)
CO2: 25 mmol/L (ref 20–31)
CREATININE: 0.71 mg/dL (ref 0.50–1.10)
Calcium: 10 mg/dL (ref 8.6–10.2)
Glucose, Bld: 84 mg/dL (ref 65–99)
Potassium: 3.9 mmol/L (ref 3.5–5.3)
Sodium: 138 mmol/L (ref 135–146)

## 2016-03-23 LAB — HEMOGLOBIN A1C
Hgb A1c MFr Bld: 6 % — ABNORMAL HIGH (ref <5.7)
MEAN PLASMA GLUCOSE: 126 mg/dL

## 2016-04-11 ENCOUNTER — Other Ambulatory Visit: Payer: Self-pay | Admitting: Family Medicine

## 2016-04-13 DIAGNOSIS — W19XXXA Unspecified fall, initial encounter: Secondary | ICD-10-CM | POA: Insufficient documentation

## 2016-04-13 NOTE — Assessment & Plan Note (Signed)
Uncontrolled, add HCTZ DASH diet and commitment to daily physical activity for a minimum of 30 minutes discussed and encouraged, as a part of hypertension management. The importance of attaining a healthy weight is also discussed.  BP/Weight 03/22/2016 10/28/2015 10/03/2015 10/01/2015 09/30/2015 04/03/2015 123456  Systolic BP [id number] 123456 [id number] - - [id number] [id number]  Diastolic BP 86 80 64 - - 92 91  Wt. (Lbs) 314 326 - 283.3 - 316.8 315.4  BMI 49.17 51.05 - - 44.36 49.61 49.39

## 2016-04-13 NOTE — Assessment & Plan Note (Signed)
Patient educated about the importance of limiting  Carbohydrate intake , the need to commit to daily physical activity for a minimum of 30 minutes , and to commit weight loss. The fact that changes in all these areas will reduce or eliminate all together the development of diabetes is stressed.  Slight improvement  Diabetic Labs Latest Ref Rng 03/22/2016 10/28/2015 10/03/2015 10/02/2015 10/01/2015  HbA1c <5.7 % 6.0(H) 6.1(H) - - -  Chol 125 - 200 mg/dL - 125 - - -  HDL >=46 mg/dL - 50 - - -  Calc LDL <130 mg/dL - 65 - - -  Triglycerides <150 mg/dL - 48 - - -  Creatinine 0.50 - 1.10 mg/dL 0.71 - 0.60 0.57 0.67   BP/Weight 03/22/2016 10/28/2015 10/03/2015 10/01/2015 09/30/2015 04/03/2015 123456  Systolic BP [id number] 123456 [id number] - - [id number] [id number]  Diastolic BP 86 80 64 - - 92 91  Wt. (Lbs) 314 326 - 283.3 - 316.8 315.4  BMI 49.17 51.05 - - 44.36 49.61 49.39   No flowsheet data found.

## 2016-04-13 NOTE — Assessment & Plan Note (Signed)
Pain and digfficulty with weight bearing 1 n month following a fall, refer to ortho for eval and management

## 2016-04-13 NOTE — Assessment & Plan Note (Signed)
Slightly improved. Patient re-educated about  the importance of commitment to a  minimum of 150 minutes of exercise per week.  The importance of healthy food choices with portion control discussed. Encouraged to start a food diary, count calories and to consider  joining a support group. Sample diet sheets offered. Goals set by the patient for the next several months.   Weight /BMI 03/22/2016 10/28/2015 10/01/2015  WEIGHT 314 lb 326 lb 283 lb 4.8 oz  HEIGHT 5\' 7"  5\' 7"  5\' 7"   BMI 49.17 kg/m2 51.05 kg/m2 -    Current exercise per week 60 minutes.

## 2016-04-13 NOTE — Assessment & Plan Note (Addendum)
H/o recent fall with subsequent right knee, ankle and foot pain, refer to ortho for further evaluation

## 2016-04-13 NOTE — Assessment & Plan Note (Signed)
Controlled, no change in medication  

## 2016-04-20 ENCOUNTER — Telehealth: Payer: Self-pay | Admitting: Family Medicine

## 2016-04-20 NOTE — Telephone Encounter (Signed)
Patient aware of lab results.

## 2016-04-20 NOTE — Telephone Encounter (Signed)
Jennifer [name] is asking for a Lab Results, please advise?

## 2016-08-11 ENCOUNTER — Ambulatory Visit: Payer: BLUE CROSS/BLUE SHIELD | Admitting: Family Medicine

## 2016-12-08 ENCOUNTER — Encounter: Payer: Self-pay | Admitting: Family Medicine

## 2016-12-08 ENCOUNTER — Ambulatory Visit (INDEPENDENT_AMBULATORY_CARE_PROVIDER_SITE_OTHER): Payer: BLUE CROSS/BLUE SHIELD | Admitting: Family Medicine

## 2016-12-08 ENCOUNTER — Other Ambulatory Visit (HOSPITAL_COMMUNITY)
Admission: RE | Admit: 2016-12-08 | Discharge: 2016-12-08 | Disposition: A | Discharge status: Home / Self Care | Payer: BLUE CROSS/BLUE SHIELD | Source: Ambulatory Visit | Attending: Family Medicine | Admitting: Family Medicine

## 2016-12-08 VITALS — BP 130/86 | HR 91 | Resp 16 | Ht 67.0 in | Wt 328.0 lb

## 2016-12-08 DIAGNOSIS — R319 Hematuria, unspecified: Secondary | ICD-10-CM

## 2016-12-08 DIAGNOSIS — R5382 Chronic fatigue, unspecified: Secondary | ICD-10-CM

## 2016-12-08 DIAGNOSIS — B9689 Other specified bacterial agents as the cause of diseases classified elsewhere: Secondary | ICD-10-CM | POA: Insufficient documentation

## 2016-12-08 DIAGNOSIS — R030 Elevated blood-pressure reading, without diagnosis of hypertension: Secondary | ICD-10-CM

## 2016-12-08 DIAGNOSIS — F418 Other specified anxiety disorders: Secondary | ICD-10-CM

## 2016-12-08 DIAGNOSIS — Z01419 Encounter for gynecological examination (general) (routine) without abnormal findings: Secondary | ICD-10-CM | POA: Insufficient documentation

## 2016-12-08 DIAGNOSIS — F411 Generalized anxiety disorder: Secondary | ICD-10-CM

## 2016-12-08 DIAGNOSIS — R7303 Prediabetes: Secondary | ICD-10-CM

## 2016-12-08 DIAGNOSIS — I10 Essential (primary) hypertension: Secondary | ICD-10-CM | POA: Diagnosis not present

## 2016-12-08 DIAGNOSIS — Z1211 Encounter for screening for malignant neoplasm of colon: Secondary | ICD-10-CM | POA: Diagnosis not present

## 2016-12-08 LAB — POC HEMOCCULT BLD/STL (OFFICE/1-CARD/DIAGNOSTIC): FECAL OCCULT BLD: POSITIVE — AB

## 2016-12-08 LAB — POCT URINALYSIS DIPSTICK
Bilirubin, UA: NEGATIVE
Glucose, UA: NEGATIVE
Ketones, UA: NEGATIVE
Leukocytes, UA: NEGATIVE
Nitrite, UA: NEGATIVE
PROTEIN UA: NEGATIVE
SPEC GRAV UA: 1.02
UROBILINOGEN UA: 0.2
pH, UA: 6

## 2016-12-08 LAB — COMPREHENSIVE METABOLIC PANEL
ALBUMIN: 4.1 g/dL (ref 3.6–5.1)
ALK PHOS: 48 U/L (ref 33–115)
ALT: 16 U/L (ref 6–29)
AST: 15 U/L (ref 10–30)
BILIRUBIN TOTAL: 0.7 mg/dL (ref 0.2–1.2)
BUN: 6 mg/dL — ABNORMAL LOW (ref 7–25)
CALCIUM: 9.2 mg/dL (ref 8.6–10.2)
CO2: 23 mmol/L (ref 20–31)
Chloride: 105 mmol/L (ref 98–110)
Creat: 0.61 mg/dL (ref 0.50–1.10)
Glucose, Bld: 84 mg/dL (ref 65–99)
Potassium: 3.8 mmol/L (ref 3.5–5.3)
Sodium: 138 mmol/L (ref 135–146)
TOTAL PROTEIN: 6.8 g/dL (ref 6.1–8.1)

## 2016-12-08 LAB — CBC
HCT: 42.3 % (ref 35.0–45.0)
Hemoglobin: 14.3 g/dL (ref 11.7–15.5)
MCH: 30.4 pg (ref 27.0–33.0)
MCHC: 33.8 g/dL (ref 32.0–36.0)
MCV: 89.8 fL (ref 80.0–100.0)
MPV: 9.2 fL (ref 7.5–12.5)
Platelets: 240 10*3/uL (ref 140–400)
RBC: 4.71 MIL/uL (ref 3.80–5.10)
RDW: 13.9 % (ref 11.0–15.0)
WBC: 7.8 10*3/uL (ref 3.8–10.8)

## 2016-12-08 LAB — LIPID PANEL
Cholesterol: 128 mg/dL (ref <200)
HDL: 59 mg/dL (ref >50)
LDL Cholesterol: 60 mg/dL (ref <100)
TRIGLYCERIDES: 46 mg/dL (ref <150)
Total CHOL/HDL Ratio: 2.2 Ratio (ref <5.0)
VLDL: 9 mg/dL (ref <30)

## 2016-12-08 LAB — TSH: TSH: 1.49 m[IU]/L

## 2016-12-08 MED ORDER — VENLAFAXINE HCL ER 75 MG PO CP24: ORAL_CAPSULE | ORAL | 3 refills | Status: DC

## 2016-12-08 MED ORDER — HYDROCHLOROTHIAZIDE 12.5 MG PO CAPS: 12.5000 mg | ORAL_CAPSULE | Freq: Every day | ORAL | 3 refills | Status: DC

## 2016-12-08 MED ORDER — BUSPIRONE HCL 7.5 MG PO TABS: 7.5000 mg | ORAL_TABLET | Freq: Three times a day (TID) | ORAL | 3 refills | Status: DC

## 2016-12-08 NOTE — Patient Instructions (Addendum)
F/U in 4.5 month, cll iof you need me before  Labs today  Urine being sent for further testing, and specimens being sent for testing for pelvic infection  Please schedule mammogram, number will be provided to call.  You are referred to Dr Garwin Brothers.for pap and contraception management   Stop metformin  Continue buspar and effexor , 5 months is sent in  Please work on good  health habits so that your health will improve. 1. Commitment to daily physical activity for 30 to 60  minutes, if you are able to do this.  2. Commitment to wise food choices. Aim for half of your  food intake to be vegetable and fruit, one quarter starchy foods, and one quarter protein. Try to eat on a regular schedule  3 meals per day, snacking between meals should be limited to vegetables or fruits or small portions of nuts. 64 ounces of water per day is generally recommended, unless you have specific health conditions, like heart failure or kidney failure where you will need to limit fluid intake.  3. Commitment to sufficient and a  good quality of physical and mental rest daily, generally between 6 to 8 hours per day.  WITH PERSISTANCE AND PERSEVERANCE, THE IMPOSSIBLE , BECOMES THE NORM! Thank you  for choosing [facility]. We consider it a privelige to serve you.  Delivering excellent health care in a caring and  compassionate way is our goal.  Partnering with you,  so that together we can achieve this goal is our strategy. 1500 cal diet sheet provided

## 2016-12-08 NOTE — Progress Notes (Signed)
Jennifer [name]     MRN: [id number]      DOB: 04-02-1976   HPI Jennifer [name] is here  With a c/o severe lower abdominal pain radiating centrally and to lower back, less than 5 mins duration C/o urinary frequency  ROS Denies recent fever has had  chills. Denies sinus pressure, nasal congestion, ear pain or sore throat. Denies chest congestion, productive cough or wheezing. Denies chest pains, palpitations and leg swelling Denies nausea, vomiting,diarrhea or constipation.   Denies dysuria, frequency, hesitancy or incontinence. Denies joint pain, swelling and limitation in mobility. Denies headaches, seizures, numbness, or tingling. C/o depression, anxiety or insomnia. Denies skin break down or rash.   PE  BP 130/86   Pulse 91   Resp 16   Ht 5\' 7"  (1.702 m)   Wt (!) 328 lb (148.8 kg)   SpO2 96%   BMI 51.37 kg/m   Patient alert and oriented and in no cardiopulmonary distress.  HEENT: No facial asymmetry, EOMI,   oropharynx pink and moist.  Neck supple no JVD, no mass.  Chest: Clear to auscultation bilaterally.  CVS: S1, S2 no murmurs, no S3.Regular rate.  ABD: Soft superficial lower abdominal tenderness, no guarding or rebound, n o organomegaly or mass, normal BS  Hemorrhoids , heme positive stool Ulcer in perineal area  Ext: No edema  MS: Adequate ROM spine, shoulders, hips and knees.  Skin: Intact, no ulcerations or rash noted.  Psych: Good eye contact, normal affect. Memory intact not anxious or depressed appearing.  CNS: CN 2-12 intact, power,  normal throughout.no focal deficits noted.   Assessment & Plan  Depression with anxiety Uncontrolled as off medication, no suicidal or homicidal ideation but poor level of function, resume medication as befpore  GAD (generalized anxiety disorder) Uncontrolled, resume buspar  Morbid obesity (HCC) Deteriorated. Patient re-educated about  the importance of commitment to a  minimum of 150 minutes of exercise per week.    The importance of healthy food choices with portion control discussed. Encouraged to start a food diary, count calories and to consider  joining a support group. Sample diet sheets offered. Goals set by the patient for the next several months.   Weight /BMI 12/08/2016 03/22/2016 10/28/2015  WEIGHT 328 lb 314 lb 326 lb  HEIGHT 5\' 7"  5\' 7"  5\' 7"   BMI 51.37 kg/m2 49.17 kg/m2 51.05 kg/m2      Prediabetes Patient educated about the importance of limiting  Carbohydrate intake , the need to commit to daily physical activity for a minimum of 30 minutes , and to commit weight loss. The fact that changes in all these areas will reduce or eliminate all together the development of diabetes is stressed.   Diabetic Labs Latest Ref Rng & Units 12/08/2016 03/22/2016 10/28/2015 10/03/2015 10/02/2015  HbA1c <5.7 % 5.7(H) 6.0(H) 6.1(H) - -  Chol <200 mg/dL 782 - 956 - -  HDL >21 mg/dL 59 - 50 - -  Calc LDL <308 mg/dL 60 - 65 - -  Triglycerides <150 mg/dL 46 - 48 - -  Creatinine 0.50 - 1.10 mg/dL 6.57 8.46 - 9.62 9.52   BP/Weight 12/08/2016 03/22/2016 10/28/2015 10/03/2015 10/01/2015 09/30/2015 04/03/2015  Systolic BP 130 136 120 110 - - 140  Diastolic BP 86 86 80 64 - - 92  Wt. (Lbs) 328 314 326 - 283.3 - 316.8  BMI 51.37 49.17 51.05 - - 44.36 49.61   No flowsheet data found.    Hematuria Lower abdominal pain with persistent hematuria,will  send for c/s if no growth , urology to further assess  Essential hypertension DASH diet and commitment to daily physical activity for a minimum of 30 minutes discussed and encouraged, as a part of hypertension management. The importance of attaining a healthy weight is also discussed.  BP/Weight 12/08/2016 03/22/2016 10/28/2015 10/03/2015 10/01/2015 09/30/2015 04/03/2015  Systolic BP 130 136 120 110 - - 140  Diastolic BP 86 86 80 64 - - 92  Wt. (Lbs) 328 314 326 - 283.3 - 316.8  BMI 51.37 49.17 51.05 - - 44.36 49.61

## 2016-12-09 ENCOUNTER — Other Ambulatory Visit (HOSPITAL_COMMUNITY)
Admission: RE | Admit: 2016-12-09 | Discharge: 2016-12-09 | Disposition: A | Discharge status: Home / Self Care | Payer: BLUE CROSS/BLUE SHIELD | Source: Other Acute Inpatient Hospital | Attending: Family Medicine | Admitting: Family Medicine

## 2016-12-09 DIAGNOSIS — R319 Hematuria, unspecified: Secondary | ICD-10-CM | POA: Insufficient documentation

## 2016-12-09 LAB — VITAMIN D 25 HYDROXY (VIT D DEFICIENCY, FRACTURES): Vit D, 25-Hydroxy: 13 ng/mL — ABNORMAL LOW (ref 30–100)

## 2016-12-09 LAB — URINALYSIS, ROUTINE W REFLEX MICROSCOPIC
Bilirubin Urine: NEGATIVE
Glucose, UA: NEGATIVE mg/dL
Ketones, ur: NEGATIVE mg/dL
Leukocytes, UA: NEGATIVE
Nitrite: NEGATIVE
PROTEIN: NEGATIVE mg/dL
Specific Gravity, Urine: 1.02 (ref 1.005–1.030)
pH: 6 (ref 5.0–8.0)

## 2016-12-09 LAB — URINALYSIS, MICROSCOPIC (REFLEX)
SQUAMOUS EPITHELIAL / LPF: NONE SEEN
WBC, UA: NONE SEEN WBC/hpf (ref 0–5)

## 2016-12-09 LAB — HEMOGLOBIN A1C
Hgb A1c MFr Bld: 5.7 % — ABNORMAL HIGH (ref <5.7)
Mean Plasma Glucose: 117 mg/dL

## 2016-12-10 LAB — CERVICOVAGINAL ANCILLARY ONLY
CHLAMYDIA, DNA PROBE: NEGATIVE
Neisseria Gonorrhea: NEGATIVE
Wet Prep (BD Affirm): POSITIVE — AB

## 2016-12-11 LAB — URINE CULTURE

## 2016-12-13 ENCOUNTER — Other Ambulatory Visit: Payer: Self-pay

## 2016-12-13 ENCOUNTER — Other Ambulatory Visit: Payer: Self-pay | Admitting: Family Medicine

## 2016-12-13 DIAGNOSIS — R39198 Other difficulties with micturition: Secondary | ICD-10-CM

## 2016-12-13 DIAGNOSIS — R319 Hematuria, unspecified: Secondary | ICD-10-CM

## 2016-12-13 MED ORDER — FLUCONAZOLE 150 MG PO TABS: 150.0000 mg | ORAL_TABLET | Freq: Once | ORAL | 0 refills | Status: AC

## 2016-12-13 MED ORDER — METRONIDAZOLE 500 MG PO TABS: 500.0000 mg | ORAL_TABLET | Freq: Two times a day (BID) | ORAL | 0 refills | Status: DC

## 2016-12-20 DIAGNOSIS — R319 Hematuria, unspecified: Secondary | ICD-10-CM | POA: Insufficient documentation

## 2016-12-20 NOTE — Assessment & Plan Note (Signed)
Uncontrolled as off medication, no suicidal or homicidal ideation but poor level of function, resume medication as befpore

## 2016-12-20 NOTE — Assessment & Plan Note (Signed)
Lower abdominal pain with persistent hematuria,will send for c/s if no growth , urology to further assess

## 2016-12-20 NOTE — Assessment & Plan Note (Signed)
Deteriorated. Patient re-educated about  the importance of commitment to a  minimum of 150 minutes of exercise per week.  The importance of healthy food choices with portion control discussed. Encouraged to start a food diary, count calories and to consider  joining a support group. Sample diet sheets offered. Goals set by the patient for the next several months.   Weight /BMI 12/08/2016 03/22/2016 10/28/2015  WEIGHT 328 lb 314 lb 326 lb  HEIGHT 5\' 7"  5\' 7"  5\' 7"   BMI 51.37 kg/m2 49.17 kg/m2 51.05 kg/m2

## 2016-12-20 NOTE — Assessment & Plan Note (Signed)
Patient educated about the importance of limiting  Carbohydrate intake , the need to commit to daily physical activity for a minimum of 30 minutes , and to commit weight loss. The fact that changes in all these areas will reduce or eliminate all together the development of diabetes is stressed.   Diabetic Labs Latest Ref Rng & Units 12/08/2016 03/22/2016 10/28/2015 10/03/2015 10/02/2015  HbA1c <5.7 % 5.7(H) 6.0(H) 6.1(H) - -  Chol <200 mg/dL 128 - 125 - -  HDL >50 mg/dL 59 - 50 - -  Calc LDL <100 mg/dL 60 - 65 - -  Triglycerides <150 mg/dL 46 - 48 - -  Creatinine 0.50 - 1.10 mg/dL 0.61 0.71 - 0.60 0.57   BP/Weight 12/08/2016 03/22/2016 10/28/2015 10/03/2015 10/01/2015 09/30/2015 [id number]  Systolic BP [id number] 123456 [id number] - - [id number]  Diastolic BP 86 86 80 64 - - 92  Wt. (Lbs) 328 314 326 - 283.3 - 316.8  BMI 51.37 49.17 51.05 - - 44.36 49.61   No flowsheet data found.

## 2016-12-20 NOTE — Assessment & Plan Note (Signed)
DASH diet and commitment to daily physical activity for a minimum of 30 minutes discussed and encouraged, as a part of hypertension management. The importance of attaining a healthy weight is also discussed.  BP/Weight 12/08/2016 03/22/2016 10/28/2015 10/03/2015 10/01/2015 09/30/2015 [id number]  Systolic BP [id number] 123456 [id number] - - [id number]  Diastolic BP 86 86 80 64 - - 92  Wt. (Lbs) 328 314 326 - 283.3 - 316.8  BMI 51.37 49.17 51.05 - - 44.36 49.61

## 2016-12-20 NOTE — Assessment & Plan Note (Signed)
Uncontrolled , resume buspar 

## 2016-12-21 ENCOUNTER — Other Ambulatory Visit: Payer: Self-pay

## 2016-12-21 MED ORDER — VITAMIN D (ERGOCALCIFEROL) 1.25 MG (50000 UNIT) PO CAPS: 50000.0000 [IU] | ORAL_CAPSULE | ORAL | 5 refills | Status: DC

## 2016-12-21 NOTE — Progress Notes (Signed)
Vitamin d sent

## 2016-12-22 ENCOUNTER — Other Ambulatory Visit: Payer: Self-pay | Admitting: Family Medicine

## 2016-12-22 DIAGNOSIS — Z09 Encounter for follow-up examination after completed treatment for conditions other than malignant neoplasm: Secondary | ICD-10-CM

## 2017-01-04 ENCOUNTER — Other Ambulatory Visit: Payer: Self-pay | Admitting: Family Medicine

## 2017-01-06 ENCOUNTER — Other Ambulatory Visit: Payer: Self-pay | Admitting: Family Medicine

## 2017-01-06 DIAGNOSIS — R928 Other abnormal and inconclusive findings on diagnostic imaging of breast: Secondary | ICD-10-CM

## 2017-01-11 ENCOUNTER — Ambulatory Visit (HOSPITAL_COMMUNITY)
Admission: RE | Admit: 2017-01-11 | Discharge: 2017-01-11 | Disposition: A | Discharge status: Home / Self Care | Payer: BLUE CROSS/BLUE SHIELD | Source: Ambulatory Visit | Attending: Family Medicine | Admitting: Family Medicine

## 2017-01-11 DIAGNOSIS — Z09 Encounter for follow-up examination after completed treatment for conditions other than malignant neoplasm: Secondary | ICD-10-CM

## 2017-01-11 DIAGNOSIS — R921 Mammographic calcification found on diagnostic imaging of breast: Secondary | ICD-10-CM | POA: Insufficient documentation

## 2017-01-11 DIAGNOSIS — R928 Other abnormal and inconclusive findings on diagnostic imaging of breast: Secondary | ICD-10-CM | POA: Diagnosis not present

## 2017-01-11 IMAGING — MG 2D DIGITAL DIAGNOSTIC BILATERAL MAMMOGRAM WITH CAD AND ADJUNCT T
8 of 12 series · 8 of 28 positions shown · non-contrast
Comparison: [DATE], [DATE], [DATE]

ACR Breast Density Category a: The breast tissue is almost entirely
fatty.

CLINICAL DATA: Delayed follow-up of probably benign calcifications
in the right breast. Patient is 40 years old. She does not palpate a
mass in either breast.

EXAM:
2D DIGITAL DIAGNOSTIC BILATERAL MAMMOGRAM WITH CAD AND ADJUNCT TOMO

[R CC (1 of 3)]
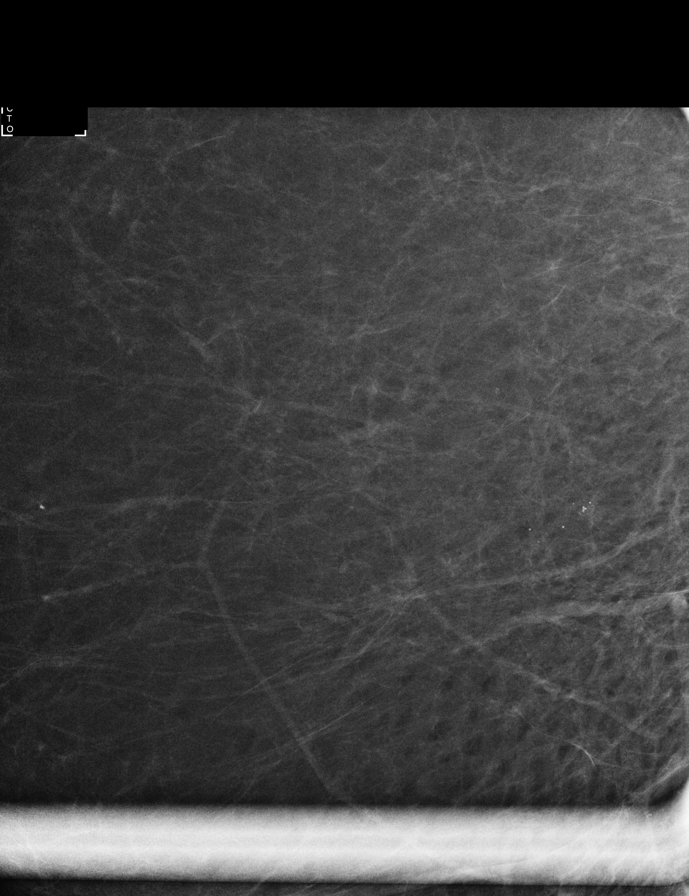

[R ML (1 of 2)]
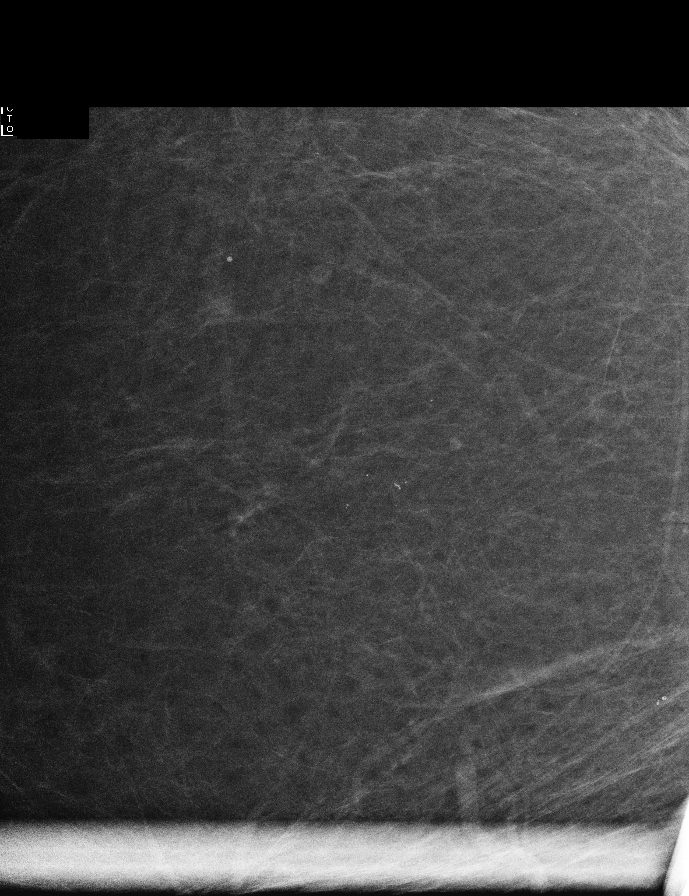

[R ML (2 of 2)]
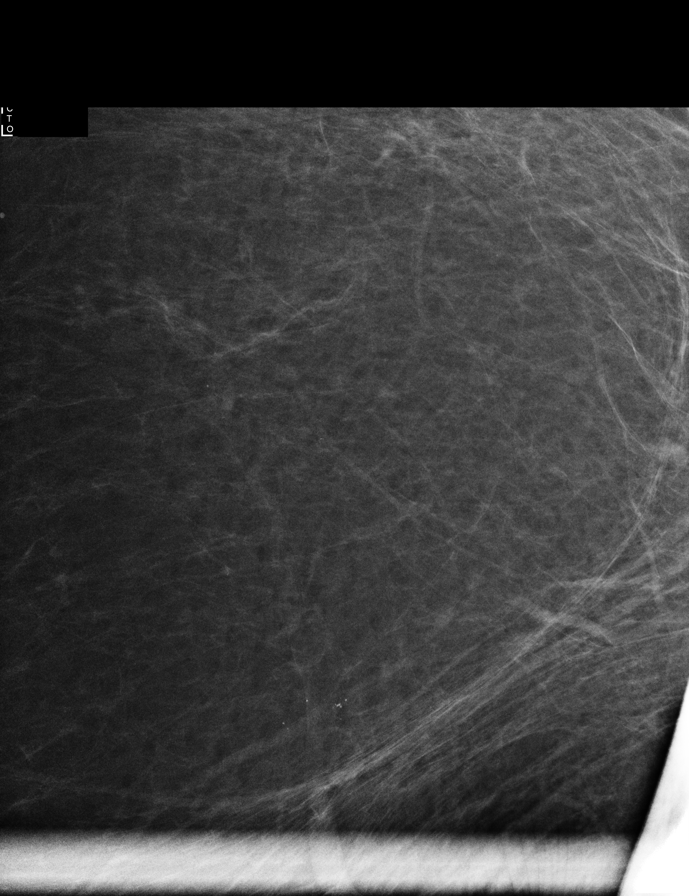

[R CC (2 of 3)]
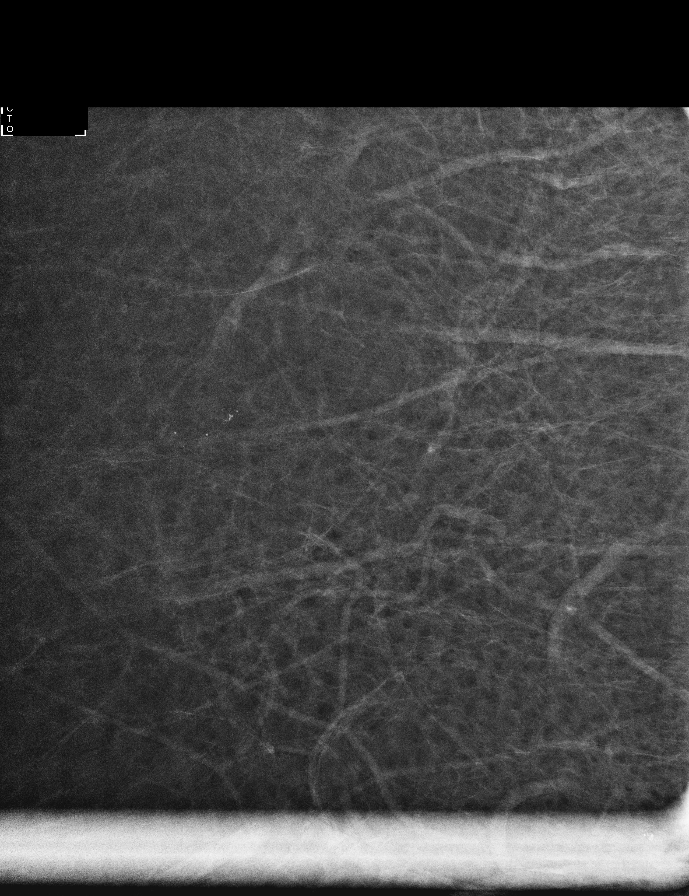

[R CC (3 of 3)]
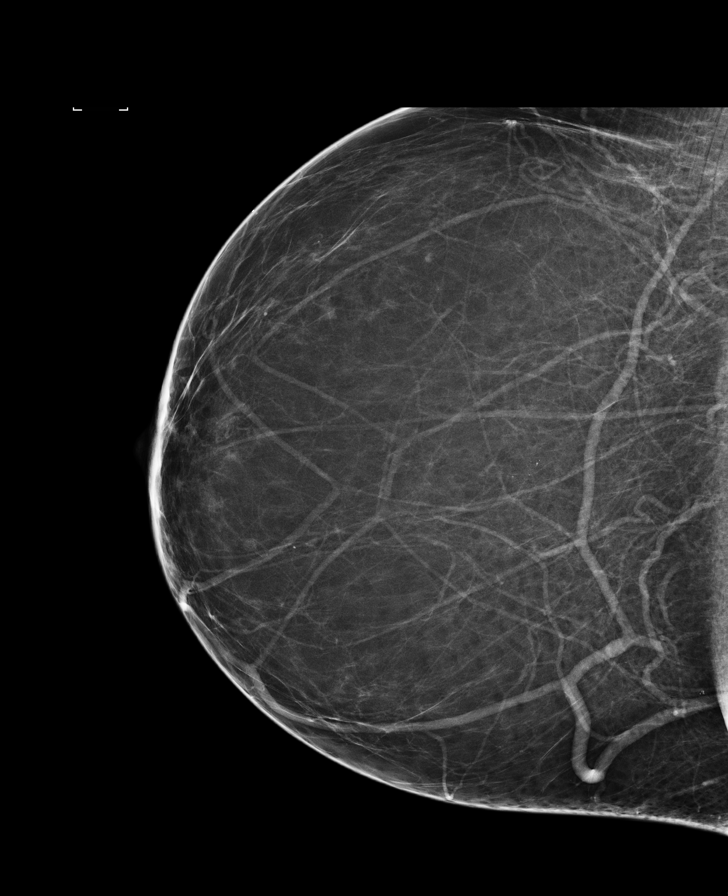

[L MLO]
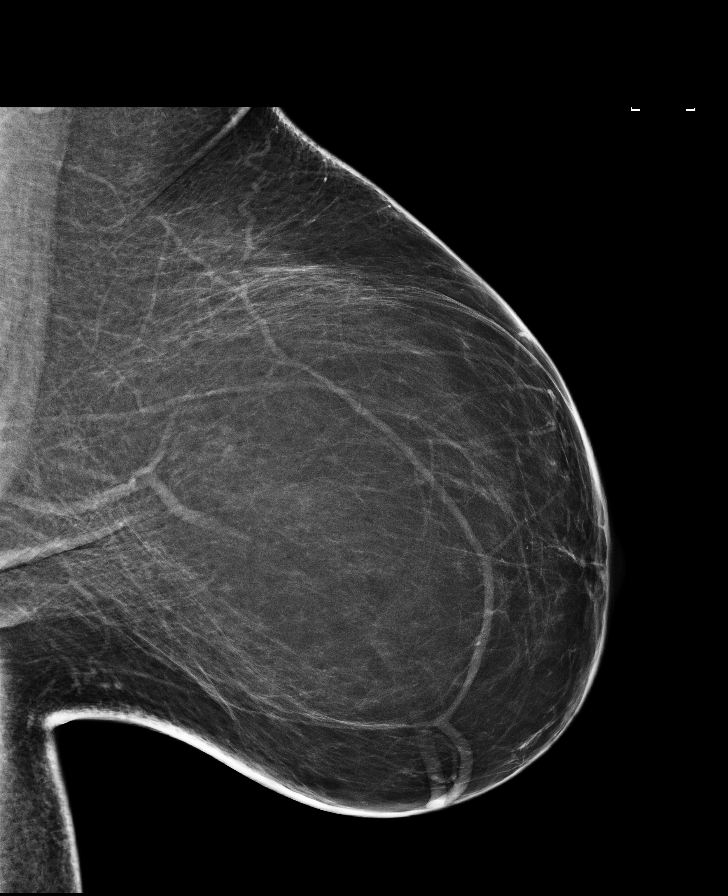

[R MLO]
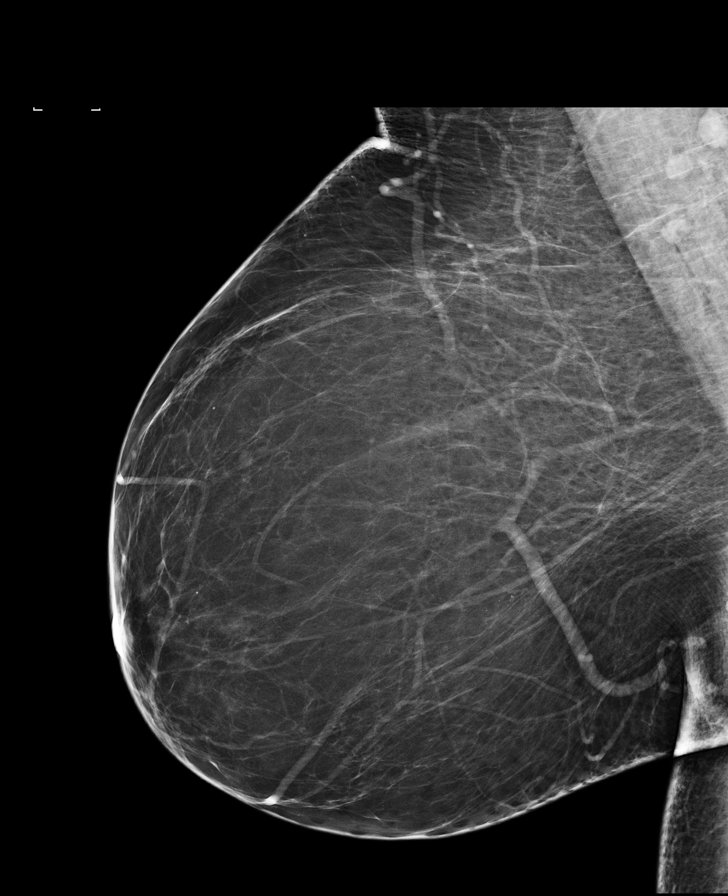

[L CC]
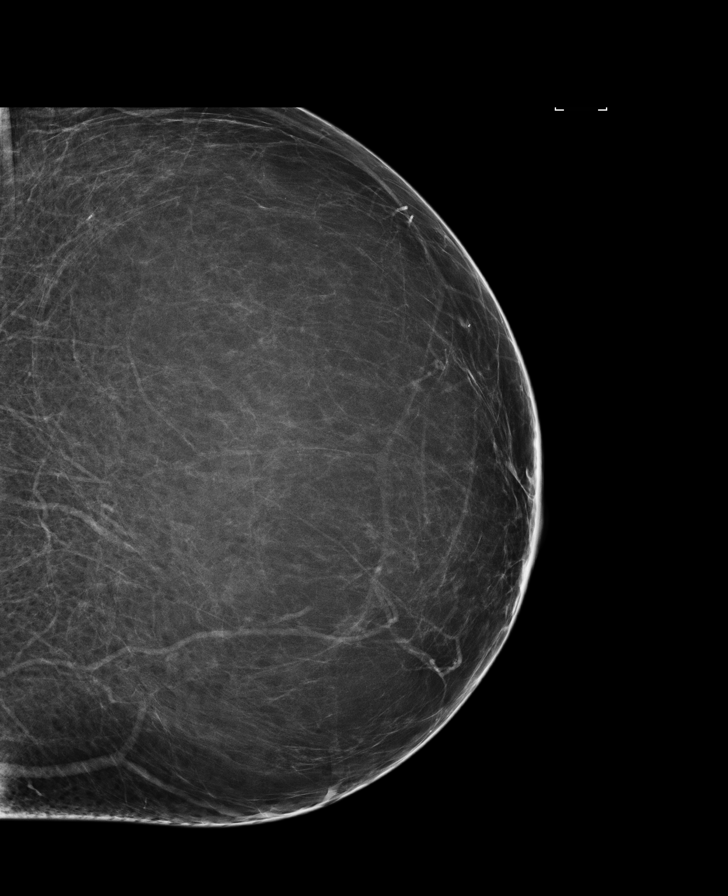

[8 of 28 positions shown; findings below may reference images not displayed]

FINDINGS: No mass or architectural distortion is identified in either breast.
Calcifications in the right breast are evaluated with magnification
views. There is a group of rounded coarse calcifications, some of
which contain lucent centers, in the central right breast. These
have benign appearances and are similar to the calcifications that
were evaluated in [CY] and [CY] in the lateral right breast. There
are similar-appearing dystrophic calcifications scattered in both
breasts. Previous identify calcifications in the outer right breast
are less apparent on today's exam. No suspicious microcalcifications
in either breast.

Mammographic images were processed with CAD.
IMPRESSION: Benign type calcifications in the right breast. No evidence of
malignancy bilaterally.

RECOMMENDATION:
Screening mammogram in one year.(Code:[CY])

I have discussed the findings and recommendations with the patient.
Results were also provided in writing at the conclusion of the
visit. If applicable, a reminder letter will be sent to the patient
regarding the next appointment.

BI-RADS CATEGORY  2: Benign.

## 2017-01-21 DIAGNOSIS — Z30432 Encounter for removal of intrauterine contraceptive device: Secondary | ICD-10-CM | POA: Diagnosis not present

## 2017-01-21 DIAGNOSIS — Z6841 Body Mass Index (BMI) 40.0 and over, adult: Secondary | ICD-10-CM | POA: Diagnosis not present

## 2017-01-21 DIAGNOSIS — Z309 Encounter for contraceptive management, unspecified: Secondary | ICD-10-CM | POA: Diagnosis not present

## 2017-02-02 ENCOUNTER — Other Ambulatory Visit: Payer: Self-pay | Admitting: Family Medicine

## 2017-03-05 ENCOUNTER — Other Ambulatory Visit: Payer: Self-pay | Admitting: Family Medicine

## 2017-03-05 DIAGNOSIS — I10 Essential (primary) hypertension: Secondary | ICD-10-CM

## 2017-04-21 ENCOUNTER — Ambulatory Visit: Payer: BLUE CROSS/BLUE SHIELD | Admitting: Family Medicine

## 2017-05-12 ENCOUNTER — Encounter: Payer: Self-pay | Admitting: *Deleted

## 2017-05-12 ENCOUNTER — Telehealth: Payer: Self-pay | Admitting: *Deleted

## 2017-05-12 NOTE — Telephone Encounter (Signed)
Patient left a message for a nurse to return her call. [phone]

## 2017-05-13 NOTE — Telephone Encounter (Signed)
Thinks she has a bacterial infection (BV) has a strong odor and itch and wants something called in for it or she is willing to come in for an appt next if she has to be seen first. Please advise

## 2017-05-13 NOTE — Telephone Encounter (Signed)
Needs to come in for urine test to confirm.  Pend cytology

## 2017-05-16 NOTE — Telephone Encounter (Signed)
Patient requested for the nurse visit to be scheduled for tomorrow at 10:00. Patient is aware of the appointment.

## 2017-05-17 ENCOUNTER — Ambulatory Visit (INDEPENDENT_AMBULATORY_CARE_PROVIDER_SITE_OTHER): Payer: BLUE CROSS/BLUE SHIELD

## 2017-05-17 ENCOUNTER — Inpatient Hospital Stay (HOSPITAL_COMMUNITY): Admission: RE | Admit: 2017-05-17 | Payer: Self-pay

## 2017-05-17 ENCOUNTER — Other Ambulatory Visit (HOSPITAL_COMMUNITY)
Admission: RE | Admit: 2017-05-17 | Discharge: 2017-05-17 | Disposition: A | Discharge status: Home / Self Care | Payer: BLUE CROSS/BLUE SHIELD | Source: Ambulatory Visit | Attending: Family Medicine | Admitting: Family Medicine

## 2017-05-17 ENCOUNTER — Telehealth: Payer: Self-pay

## 2017-05-17 DIAGNOSIS — R8290 Unspecified abnormal findings in urine: Secondary | ICD-10-CM

## 2017-05-17 LAB — POCT URINALYSIS DIPSTICK
BILIRUBIN UA: NEGATIVE
GLUCOSE UA: NEGATIVE
Ketones, UA: NEGATIVE
Leukocytes, UA: NEGATIVE
NITRITE UA: NEGATIVE
Protein, UA: NEGATIVE
Spec Grav, UA: 1.025 (ref 1.010–1.025)
Urobilinogen, UA: 0.2 E.U./dL
pH, UA: 5.5 (ref 5.0–8.0)

## 2017-05-17 NOTE — Telephone Encounter (Signed)
Done

## 2017-05-17 NOTE — Telephone Encounter (Signed)
Pt in office today for poct urine. Results in epic. Urine is VERY foul. Would you like to test for anything else?

## 2017-05-17 NOTE — Telephone Encounter (Signed)
pls send for wet prep  , GC and Chlamydia also

## 2017-05-17 NOTE — Progress Notes (Signed)
poct urine done.

## 2017-05-19 ENCOUNTER — Telehealth: Payer: Self-pay

## 2017-05-19 DIAGNOSIS — R8290 Unspecified abnormal findings in urine: Secondary | ICD-10-CM

## 2017-05-19 LAB — GC/CHLAMYDIA PROBE AMP (~~LOC~~) NOT AT ARMC
CHLAMYDIA, DNA PROBE: NEGATIVE
NEISSERIA GONORRHEA: NEGATIVE

## 2017-05-19 NOTE — Telephone Encounter (Signed)
New orders signed.

## 2017-06-13 ENCOUNTER — Ambulatory Visit (INDEPENDENT_AMBULATORY_CARE_PROVIDER_SITE_OTHER): Payer: BLUE CROSS/BLUE SHIELD | Admitting: Family Medicine

## 2017-06-13 ENCOUNTER — Other Ambulatory Visit: Payer: Self-pay

## 2017-06-13 VITALS — BP 130/98 | HR 107 | Temp 97.6°F | Resp 16 | Ht 67.0 in | Wt 335.2 lb

## 2017-06-13 DIAGNOSIS — I10 Essential (primary) hypertension: Secondary | ICD-10-CM

## 2017-06-13 DIAGNOSIS — F418 Other specified anxiety disorders: Secondary | ICD-10-CM

## 2017-06-13 DIAGNOSIS — F411 Generalized anxiety disorder: Secondary | ICD-10-CM | POA: Diagnosis not present

## 2017-06-13 MED ORDER — HYDROCHLOROTHIAZIDE 25 MG PO TABS: 25.0000 mg | ORAL_TABLET | Freq: Every day | ORAL | 3 refills | Status: DC

## 2017-06-13 MED ORDER — POTASSIUM CHLORIDE CRYS ER 20 MEQ PO TBCR: 20.0000 meq | EXTENDED_RELEASE_TABLET | Freq: Every day | ORAL | 3 refills | Status: DC

## 2017-06-13 MED ORDER — VITAMIN D (ERGOCALCIFEROL) 1.25 MG (50000 UNIT) PO CAPS: 50000.0000 [IU] | ORAL_CAPSULE | ORAL | 1 refills | Status: DC

## 2017-06-13 NOTE — Assessment & Plan Note (Signed)
Uncontrolled, increase hCTZ dose,  DASH diet and commitment to daily physical activity for a minimum of 30 minutes discussed and encouraged, as a part of hypertension management. The importance of attaining a healthy weight is also discussed.  BP/Weight 06/13/2017 12/08/2016 03/22/2016 10/28/2015 10/03/2015 10/01/2015 21/97/5883  Systolic BP 254 982 641 583 094 - -  Diastolic BP 98 86 86 80 64 - -  Wt. (Lbs) 335.25 328 314 326 - 283.3 -  BMI 52.51 51.37 49.17 51.05 - - 44.36

## 2017-06-13 NOTE — Patient Instructions (Addendum)
F/u in 3 month, call if you need me before  Increase hCTZ dose to 25 mg daily, take TWO HCTZ 12.5 mg daily, and new is  Potassium 20 meq  Daily   You need tele psych today  Fasting chem 7  And HBA1C 1 week before next visit  Please work on good  health habits so that your health will improve. 1. Commitment to daily physical activity for 30 to 60  minutes, if you are able to do this.  2. Commitment to wise food choices. Aim for half of your  food intake to be vegetable and fruit, one quarter starchy foods, and one quarter protein. Try to eat on a regular schedule  3 meals per day, snacking between meals should be limited to vegetables or fruits or small portions of nuts. 64 ounces of water per day is generally recommended, unless you have specific health conditions, like heart failure or kidney failure where you will need to limit fluid intake.  3. Commitment to sufficient and a  good quality of physical and mental rest daily, generally between 6 to 8 hours per day.  WITH PERSISTANCE AND PERSEVERANCE, THE IMPOSSIBLE , BECOMES THE NORM! It is important that you exercise regularly at least 30 minutes 5 times a week. If you develop chest pain, have severe difficulty breathing, or feel very tired, stop exercising immediately and seek medical attention

## 2017-06-14 ENCOUNTER — Telehealth: Payer: Self-pay | Admitting: *Deleted

## 2017-06-14 NOTE — Telephone Encounter (Signed)
Jennifer [name] called back left a message. I called Jennifer [name] back and had to leave her a message.

## 2017-06-14 NOTE — Telephone Encounter (Signed)
Ava from [facility] called left message requesting patient's PHQ score, Ava did not leave a contact number. Ava stated she would call back

## 2017-06-14 NOTE — Telephone Encounter (Signed)
It was inititally recorded as zero but when Dr Moshe Cipro re tested her it was 3. Please advise if she calls back

## 2017-06-18 ENCOUNTER — Encounter: Payer: Self-pay | Admitting: Family Medicine

## 2017-06-18 NOTE — Assessment & Plan Note (Signed)
Deteriorated. Patient re-educated about  the importance of commitment to a  minimum of 150 minutes of exercise per week.  The importance of healthy food choices with portion control discussed. Encouraged to start a food diary, count calories and to consider  joining a support group. Sample diet sheets offered. Goals set by the patient for the next several months.   Weight /BMI 06/13/2017 12/08/2016 03/22/2016  WEIGHT 335 lb 4 oz 328 lb 314 lb  HEIGHT 5\' 7"  5\' 7"  5\' 7"   BMI 52.51 kg/m2 51.37 kg/m2 49.17 kg/m2

## 2017-06-18 NOTE — Progress Notes (Signed)
Jennifer [name]     MRN: [id number]      DOB: [birth year]   HPI [name] is here for follow up and re-evaluation of chronic medical conditions, medication management and review of any available recent lab and radiology data.  Preventive health is updated, specifically  Cancer screening and Immunization.   The PT denies any adverse reactions to current medications since the last visit.  Concerned about weight , was hoping to be able to resume appetite suppressant but unfortunately she is still has uncontrolled hypertension She remains very depressed, is not suicidal or homicidal, but uses food as an emotional crutch, states she is now ready for therapy  ROS Denies recent fever or chills. Denies sinus pressure, nasal congestion, ear pain or sore throat. Denies chest congestion, productive cough or wheezing. Denies chest pains, palpitations and leg swelling Denies abdominal pain, nausea, vomiting,diarrhea or constipation.   Denies dysuria, frequency, hesitancy or incontinence. Denies joint pain, swelling and limitation in mobility. Denies headaches, seizures, numbness, or tingling.  Denies skin break down or rash.   PE  BP (!) 130/98 (BP Location: Right Arm, Patient Position: Sitting, Cuff Size: Normal)   Pulse (!) 107   Temp 97.6 F (36.4 C) (Other (Comment))   Resp 16   Ht 5\' 7"  (1.702 m)   Wt (!) 335 lb 4 oz (152.1 kg)   SpO2 95%   BMI 52.51 kg/m   Patient alert and oriented and in no cardiopulmonary distress.  HEENT: No facial asymmetry, EOMI,   oropharynx pink and moist.  Neck supple no JVD, no mass.  Chest: Clear to auscultation bilaterally.  CVS: S1, S2 no murmurs, no S3.Regular rate.  ABD: Soft non tender.   Ext: No edema  MS: Adequate ROM spine, shoulders, hips and knees.  Skin: Intact, no ulcerations or rash noted.  Psych: Good eye contact, normal affect. Memory intact not anxious mildly depressed appearing.  CNS: CN 2-12 intact, power,  normal  throughout.no focal deficits noted.   Assessment & Plan  Essential hypertension Uncontrolled, increase hCTZ dose,  DASH diet and commitment to daily physical activity for a minimum of 30 minutes discussed and encouraged, as a part of hypertension management. The importance of attaining a healthy weight is also discussed.  BP/Weight 06/13/2017 12/08/2016 03/22/2016 10/28/2015 10/03/2015 10/01/2015 09/30/2015  Systolic BP 130 130 136 120 110 - -  Diastolic BP 98 86 86 80 64 - -  Wt. (Lbs) 335.25 328 314 326 - 283.3 -  BMI 52.51 51.37 49.17 51.05 - - 44.36       Depression with anxiety Pt referred to telepsych on day of visit, will need both psychiatry and therapy, she agrees to this  Morbid obesity (HCC) Deteriorated. Patient re-educated about  the importance of commitment to a  minimum of 150 minutes of exercise per week.  The importance of healthy food choices with portion control discussed. Encouraged to start a food diary, count calories and to consider  joining a support group. Sample diet sheets offered. Goals set by the patient for the next several months.   Weight /BMI 06/13/2017 12/08/2016 03/22/2016  WEIGHT 335 lb 4 oz 328 lb 314 lb  HEIGHT 5\' 7"  5\' 7"  5\' 7"   BMI 52.51 kg/m2 51.37 kg/m2 49.17 kg/m2      GAD (generalized anxiety disorder) Improved with buspar, but needs to take 3 times daily as prescribed. Pt now referred to psychiatry

## 2017-06-18 NOTE — Assessment & Plan Note (Signed)
Improved with buspar, but needs to take 3 times daily as prescribed. Pt now referred to psychiatry

## 2017-06-18 NOTE — Assessment & Plan Note (Signed)
Pt referred to telepsych on day of visit, will need both psychiatry and therapy, she agrees to this

## 2017-07-05 ENCOUNTER — Ambulatory Visit (HOSPITAL_COMMUNITY): Payer: BLUE CROSS/BLUE SHIELD | Admitting: Licensed Clinical Social Worker

## 2017-07-21 ENCOUNTER — Ambulatory Visit (HOSPITAL_COMMUNITY): Payer: Self-pay | Admitting: Licensed Clinical Social Worker

## 2017-08-04 ENCOUNTER — Telehealth: Payer: Self-pay | Admitting: Family Medicine

## 2017-08-04 ENCOUNTER — Other Ambulatory Visit: Payer: Self-pay | Admitting: Family Medicine

## 2017-08-04 MED ORDER — HYDROCHLOROTHIAZIDE 25 MG PO TABS: 25.0000 mg | ORAL_TABLET | Freq: Every day | ORAL | 1 refills | Status: DC

## 2017-08-04 NOTE — Telephone Encounter (Signed)
Seen 7 30 18   New rx sent.

## 2017-08-04 NOTE — Telephone Encounter (Signed)
Patient states her BP Rx was increased by Dr. Moshe Cipro, but she talked with the Pharmacy this morning and they've been unable to contact us regarding the Rx.  She is completely  Out.  To be called in @ [place]

## 2017-08-04 NOTE — Telephone Encounter (Signed)
Seen 7 30 18

## 2017-08-05 ENCOUNTER — Ambulatory Visit (INDEPENDENT_AMBULATORY_CARE_PROVIDER_SITE_OTHER): Payer: BLUE CROSS/BLUE SHIELD | Admitting: Licensed Clinical Social Worker

## 2017-08-05 DIAGNOSIS — F331 Major depressive disorder, recurrent, moderate: Secondary | ICD-10-CM
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
## 2017-08-24 ENCOUNTER — Telehealth (HOSPITAL_COMMUNITY): Payer: Self-pay

## 2017-08-26 ENCOUNTER — Ambulatory Visit (HOSPITAL_COMMUNITY): Payer: Self-pay | Admitting: Licensed Clinical Social Worker

## 2017-09-01 ENCOUNTER — Ambulatory Visit (INDEPENDENT_AMBULATORY_CARE_PROVIDER_SITE_OTHER): Payer: BLUE CROSS/BLUE SHIELD | Admitting: Family Medicine

## 2017-09-01 ENCOUNTER — Encounter: Payer: Self-pay | Admitting: Family Medicine

## 2017-09-01 VITALS — BP 122/86 | HR 90 | Ht 68.0 in | Wt 337.0 lb

## 2017-09-01 DIAGNOSIS — Z23 Encounter for immunization: Secondary | ICD-10-CM

## 2017-09-01 DIAGNOSIS — I10 Essential (primary) hypertension: Secondary | ICD-10-CM | POA: Diagnosis not present

## 2017-09-01 DIAGNOSIS — R7303 Prediabetes: Secondary | ICD-10-CM

## 2017-09-01 DIAGNOSIS — F411 Generalized anxiety disorder: Secondary | ICD-10-CM | POA: Diagnosis not present

## 2017-09-01 DIAGNOSIS — F99 Mental disorder, not otherwise specified: Secondary | ICD-10-CM

## 2017-09-01 DIAGNOSIS — F5105 Insomnia due to other mental disorder: Secondary | ICD-10-CM

## 2017-09-01 MED ORDER — PHENTERMINE HCL 37.5 MG PO TABS: 37.5000 mg | ORAL_TABLET | Freq: Every day | ORAL | 1 refills | Status: DC

## 2017-09-01 MED ORDER — TEMAZEPAM 15 MG PO CAPS: 15.0000 mg | ORAL_CAPSULE | Freq: Every evening | ORAL | 3 refills | Status: DC | PRN

## 2017-09-01 NOTE — Patient Instructions (Addendum)
Annual physical exam with pap in 8 to 10 weeks  Flu vaccine today  HBA1C, chem 7 , , vit D , CBC today  Start phentermine half tablet every morning   Restoril one at bedtime for sleep  It is important that you exercise regularly at least 30 minutes 5 times a week. If you develop chest pain, have severe difficulty breathing, or feel very tired, stop exercising immediately and seek medical attention    Please work on good  health habits so that your health will improve. 1. Commitment to daily physical activity for 30 to 60  minutes, if you are able to do this.  2. Commitment to wise food choices. Aim for half of your  food intake to be vegetable and fruit, one quarter starchy foods, and one quarter protein. Try to eat on a regular schedule  3 meals per day, snacking between meals should be limited to vegetables or fruits or small portions of nuts. 64 ounces of water per day is generally recommended, unless you have specific health conditions, like heart failure or kidney failure where you will need to limit fluid intake.  3. Commitment to sufficient and a  good quality of physical and mental rest daily, generally between 6 to 8 hours per day.  WITH PERSISTANCE AND PERSEVERANCE, THE IMPOSSIBLE , BECOMES THE NORM!   Thanks for choosing [facility], we consider it a privelige to serve you.

## 2017-09-02 ENCOUNTER — Encounter: Payer: Self-pay | Admitting: Family Medicine

## 2017-09-02 LAB — COMPLETE METABOLIC PANEL WITH GFR
AG Ratio: 1.7 (calc) (ref 1.0–2.5)
ALBUMIN MSPROF: 4.5 g/dL (ref 3.6–5.1)
ALT: 23 U/L (ref 6–29)
AST: 20 U/L (ref 10–30)
Alkaline phosphatase (APISO): 57 U/L (ref 33–115)
BUN: 9 mg/dL (ref 7–25)
CALCIUM: 9.6 mg/dL (ref 8.6–10.2)
CO2: 29 mmol/L (ref 20–32)
CREATININE: 0.7 mg/dL (ref 0.50–1.10)
Chloride: 100 mmol/L (ref 98–110)
GFR, EST AFRICAN AMERICAN: 123 mL/min/{1.73_m2} (ref >=60)
GFR, EST NON AFRICAN AMERICAN: 106 mL/min/{1.73_m2} (ref >=60)
GLOBULIN: 2.7 g/dL (ref 1.9–3.7)
GLUCOSE: 84 mg/dL (ref 65–139)
Potassium: 3.9 mmol/L (ref 3.5–5.3)
SODIUM: 139 mmol/L (ref 135–146)
TOTAL PROTEIN: 7.2 g/dL (ref 6.1–8.1)
Total Bilirubin: 0.5 mg/dL (ref 0.2–1.2)

## 2017-09-02 LAB — CBC
HEMATOCRIT: 40.3 % (ref 35.0–45.0)
HEMOGLOBIN: 14.1 g/dL (ref 11.7–15.5)
MCH: 31.2 pg (ref 27.0–33.0)
MCHC: 35 g/dL (ref 32.0–36.0)
MCV: 89.2 fL (ref 80.0–100.0)
MPV: 9.7 fL (ref 7.5–12.5)
Platelets: 301 10*3/uL (ref 140–400)
RBC: 4.52 10*6/uL (ref 3.80–5.10)
RDW: 12.9 % (ref 11.0–15.0)
WBC: 7.1 10*3/uL (ref 3.8–10.8)

## 2017-09-02 LAB — HEMOGLOBIN A1C
EAG (MMOL/L): 7.1 (calc)
HEMOGLOBIN A1C: 6.1 %{Hb} — AB (ref <5.7)
MEAN PLASMA GLUCOSE: 128 (calc)

## 2017-09-02 LAB — VITAMIN D 25 HYDROXY (VIT D DEFICIENCY, FRACTURES): Vit D, 25-Hydroxy: 23 ng/mL — ABNORMAL LOW (ref 30–100)

## 2017-09-04 NOTE — Assessment & Plan Note (Signed)
Controlled, no change in medication DASH diet and commitment to daily physical activity for a minimum of 30 minutes discussed and encouraged, as a part of hypertension management. The importance of attaining a healthy weight is also discussed.  BP/Weight 09/01/2017 06/13/2017 12/08/2016 03/22/2016 10/28/2015 10/03/2015 24/26/8341  Systolic BP 962 229 798 921 194 174 -  Diastolic BP 86 98 86 86 80 64 -  Wt. (Lbs) 337 335.25 328 314 326 - 283.3  BMI 51.24 52.51 51.37 49.17 51.05 - -

## 2017-09-04 NOTE — Assessment & Plan Note (Addendum)
Deteriorated.Start half phentermine daily,  Goal of 3 pound weight loss per month Patient re-educated about  the importance of commitment to a  minimum of 150 minutes of exercise per week.  The importance of healthy food choices with portion control discussed. Encouraged to start a food diary, count calories and to consider  joining a support group. Sample diet sheets offered. Goals set by the patient for the next several months.   Weight /BMI 09/01/2017 06/13/2017 12/08/2016  WEIGHT 337 lb 335 lb 4 oz 328 lb  HEIGHT 5\' 8"  5\' 7"  5\' 7"   BMI 51.24 kg/m2 52.51 kg/m2 51.37 kg/m2

## 2017-09-04 NOTE — Progress Notes (Signed)
Jennifer [name]     MRN: [id number]      DOB: [birth year]   HPI Jennifer [name] is here for follow up and re-evaluation of chronic medical conditions, medication management and review of any available recent lab and radiology data.  Preventive health is updated, specifically  Cancer screening and Immunization.   She has started therapy she is stll becoming engaged, finds homework difficult in terms of the time and energy required but she will follow through and do it as she realizes the need The PT denies any adverse reactions to current medications since the last visit.  C/o difficulty falling and staying asleep , despite good sleep hygiene, requests medication to assist with this Requests medication help with appetite suppression as s her weight is not going down, battles with emotional overeating, is working on exercise  regime  ROS Denies recent fever or chills. Denies sinus pressure, nasal congestion, ear pain or sore throat. Denies chest congestion, productive cough or wheezing. Denies chest pains, palpitations and leg swelling Denies abdominal pain, nausea, vomiting,diarrhea or constipation.   Denies dysuria, frequency, hesitancy or incontinence. Denies joint pain, swelling and limitation in mobility. Denies headaches, seizures, numbness, or tingling. Denies skin break down or rash.Has alopecia   PE  BP 122/86   Pulse 90   Ht 5\' 8"  (1.727 m)   Wt (!) 337 lb (152.9 kg)   SpO2 95%   BMI 51.24 kg/m   Patient alert and oriented and in no cardiopulmonary distress.  HEENT: No facial asymmetry, EOMI,   oropharynx pink and moist.  Neck supple no JVD, no mass.  Chest: Clear to auscultation bilaterally.  CVS: S1, S2 no murmurs, no S3.Regular rate.  ABD: Soft non tender.   Ext: No edema  MS: Adequate ROM spine, shoulders, hips and knees.  Skin: Intact, no ulcerations or rash noted.  Psych: Good eye contact, normal affect. Memory intact not anxious or depressed  appearing.  CNS: CN 2-12 intact, power,  normal throughout.no focal deficits noted.   Assessment & Plan  Essential hypertension Controlled, no change in medication DASH diet and commitment to daily physical activity for a minimum of 30 minutes discussed and encouraged, as a part of hypertension management. The importance of attaining a healthy weight is also discussed.  BP/Weight 09/01/2017 06/13/2017 12/08/2016 03/22/2016 10/28/2015 10/03/2015 10/01/2015  Systolic BP 122 130 130 136 120 110 -  Diastolic BP 86 98 86 86 80 64 -  Wt. (Lbs) 337 335.25 328 314 326 - 283.3  BMI 51.24 52.51 51.37 49.17 51.05 - -       Morbid obesity (HCC) Deteriorated.Start half phentermine daily,  Goal of 3 pound weight loss per month Patient re-educated about  the importance of commitment to a  minimum of 150 minutes of exercise per week.  The importance of healthy food choices with portion control discussed. Encouraged to start a food diary, count calories and to consider  joining a support group. Sample diet sheets offered. Goals set by the patient for the next several months.   Weight /BMI 09/01/2017 06/13/2017 12/08/2016  WEIGHT 337 lb 335 lb 4 oz 328 lb  HEIGHT 5\' 8"  5\' 7"  5\' 7"   BMI 51.24 kg/m2 52.51 kg/m2 51.37 kg/m2      Insomnia due to other mental disorder Sleep hygiene reviewed and written information offered also. Prescription sent for  medication needed.   Prediabetes Patient educated about the importance of limiting  Carbohydrate intake , the need to commit to daily physical  activity for a minimum of 30 minutes , and to commit weight loss. The fact that changes in all these areas will reduce or eliminate all together the development of diabetes is stressed.  Deteriorated  Diabetic Labs Latest Ref Rng & Units 09/01/2017 12/08/2016 03/22/2016 10/28/2015 10/03/2015  HbA1c <5.7 % of total Hgb 6.1(H) 5.7(H) 6.0(H) 6.1(H) -  Chol <200 mg/dL - 409 - 811 -  HDL >91 mg/dL - 59 - 50 -  Calc  LDL <100 mg/dL - 60 - 65 -  Triglycerides <150 mg/dL - 46 - 48 -  Creatinine 0.50 - 1.10 mg/dL 4.78 2.95 6.21 - 3.08   BP/Weight 09/01/2017 06/13/2017 12/08/2016 03/22/2016 10/28/2015 10/03/2015 10/01/2015  Systolic BP 122 130 130 136 120 110 -  Diastolic BP 86 98 86 86 80 64 -  Wt. (Lbs) 337 335.25 328 314 326 - 283.3  BMI 51.24 52.51 51.37 49.17 51.05 - -   No flowsheet data found.    GAD (generalized anxiety disorder) Controlled, no change in medication

## 2017-09-04 NOTE — Assessment & Plan Note (Signed)
Controlled, no change in medication  

## 2017-09-04 NOTE — Assessment & Plan Note (Signed)
Sleep hygiene reviewed and written information offered also. Prescription sent for  medication needed.  

## 2017-09-04 NOTE — Assessment & Plan Note (Signed)
Patient educated about the importance of limiting  Carbohydrate intake , the need to commit to daily physical activity for a minimum of 30 minutes , and to commit weight loss. The fact that changes in all these areas will reduce or eliminate all together the development of diabetes is stressed.  Deteriorated  Diabetic Labs Latest Ref Rng & Units 09/01/2017 12/08/2016 03/22/2016 10/28/2015 10/03/2015  HbA1c <5.7 % of total Hgb 6.1(H) 5.7(H) 6.0(H) 6.1(H) -  Chol <200 mg/dL - 128 - 125 -  HDL >50 mg/dL - 59 - 50 -  Calc LDL <100 mg/dL - 60 - 65 -  Triglycerides <150 mg/dL - 46 - 48 -  Creatinine 0.50 - 1.10 mg/dL 0.70 0.61 0.71 - 0.60   BP/Weight 09/01/2017 06/13/2017 12/08/2016 03/22/2016 10/28/2015 10/03/2015 65/53/7482  Systolic BP 707 867 544 920 100 712 -  Diastolic BP 86 98 86 86 80 64 -  Wt. (Lbs) 337 335.25 328 314 326 - 283.3  BMI 51.24 52.51 51.37 49.17 51.05 - -   No flowsheet data found.

## 2017-09-19 ENCOUNTER — Encounter (HOSPITAL_COMMUNITY): Payer: Self-pay | Admitting: Licensed Clinical Social Worker

## 2017-09-19 ENCOUNTER — Ambulatory Visit (INDEPENDENT_AMBULATORY_CARE_PROVIDER_SITE_OTHER): Payer: BLUE CROSS/BLUE SHIELD | Admitting: Licensed Clinical Social Worker

## 2017-09-19 DIAGNOSIS — F331 Major depressive disorder, recurrent, moderate: Secondary | ICD-10-CM | POA: Diagnosis not present

## 2017-09-19 NOTE — Progress Notes (Signed)
   THERAPIST PROGRESS NOTE  Session Time: 10:00 am-10:50 am  Participation Level: Active  Behavioral Response: CasualAlertAnxious and Depressed  Type of Therapy: Individual Therapy  Treatment Goals addressed: Coping  Interventions: CBT and Solution Focused  Summary: Jennifer [name] is a 41 y.o. female who presents oriented x5 (person, place, situation, time and object), alert, average height, overweight, causally dressed,  and cooperative address mood. Patient has a history of medical treatment that includes chronic fatigue and hypertension. Patient has a minimal mental health treatment history including medication management. Patient denies symptoms of mania. Patient admits to passive thoughts of suicide and denies homicidal ideation. Patient denies psychosis including auditory and visual hallucinations. Patient denies substance abuse.  Patient had an average score of 5.75 out of 10 on the Outcome Rating Scale. Patient reported that she has experienced an increase in anxiety. Patient reported feeling a lack of motivation. Patient shared the example of attending church: she beats herself up because she has not been attending even though she was raised in the church but she can't get into the church she is attending. Patient feels like she is so tired and has no motivation to do the things she wants to do. Patient noted that she has an issue of self esteem which makes her depressed and impacts her mood as well as energy. After discussion, patient understood that she has negative core beliefs about herself and she needs to change those to improve her mood. Patient noted that do improve her mood she needs to tale time for herself and do things for herself. Patient said that she needs to treat herself and not with food. Patient also understood that doing things for herself should be viewed as self love or in other words, she loves herself enough to do something for herself. Patient committed to do things  for herself and identify her negative core beliefs. Patient rated the session 10 out of 10 on the Session Rating Scale.   Patient engaged in session. She responded well to interventions. Patient continues to meet criteria for Major Depressive disorder, recurrent episode, moderate with anxious distress. Patient will continue in outpatient therapy due to being the least restrictive service to meet her needs. Patient made no progress on her goals at this time.   Suicidal/Homicidal: Negativewithout intent/plan  Therapist Response: Therapist reviewed patient's recent thoughts and behaviors. Therapist utilized CBT to address mood. Therapist processed patient's feelings to identify triggers for mood. Therapist discussed with patient how she can overcome her feelings of anxiety and mood by addressing negative core beliefs. Therapist discussed with patient "self love" to improve her mood. Therapist committed patient to do things for herself and identify her negative core beliefs. Therapist administered the Outcome Rating Scale and the Session Rating Scale.  Plan: Return again in 2-3 weeks. Therapist will review patient goals on or before 12.21.2018  Diagnosis: Axis I: Major depressive disorder, recurrent episode, moderate with anxious distress    Axis II: No diagnosis    Glori Bickers, LCSW 09/19/2017

## 2017-10-11 ENCOUNTER — Ambulatory Visit (HOSPITAL_COMMUNITY): Payer: Self-pay | Admitting: Licensed Clinical Social Worker

## 2017-10-17 ENCOUNTER — Telehealth (HOSPITAL_COMMUNITY): Payer: Self-pay

## 2017-10-19 ENCOUNTER — Encounter (HOSPITAL_COMMUNITY): Payer: Self-pay

## 2017-10-31 ENCOUNTER — Encounter (HOSPITAL_COMMUNITY): Payer: Self-pay | Admitting: Licensed Clinical Social Worker

## 2017-10-31 ENCOUNTER — Ambulatory Visit (INDEPENDENT_AMBULATORY_CARE_PROVIDER_SITE_OTHER): Payer: BLUE CROSS/BLUE SHIELD | Admitting: Licensed Clinical Social Worker

## 2017-10-31 DIAGNOSIS — F331 Major depressive disorder, recurrent, moderate: Secondary | ICD-10-CM | POA: Diagnosis not present

## 2017-10-31 NOTE — Progress Notes (Signed)
   THERAPIST PROGRESS NOTE  Session Time: 9:00 am-9:50 am  Participation Level: Active  Behavioral Response: CasualAlertAnxious and Depressed  Type of Therapy: Individual Therapy  Treatment Goals addressed: Coping  Interventions: CBT and Solution Focused  Summary: Jennifer [name] is a 41 y.o. female who presents oriented x5 (person, place, situation, time and object), alert, average height, overweight, causally dressed,  and cooperative address mood. Patient has a history of medical treatment that includes chronic fatigue and hypertension. Patient has a minimal mental health treatment history including medication management. Patient denies symptoms of mania. Patient admits to passive thoughts of suicide and denies homicidal ideation. Patient denies psychosis including auditory and visual hallucinations. Patient denies substance abuse.  Patient had an average score of 5.75 out of 10 on the Outcome Rating Scale which is the same as the last session. Patient reported that she worked on cleaning her room. Patient reported that she has been feeling worthless. Patient noted that she is blessed but she feels like she struggles financially, with her weight/looks, and with family. After discussion, patient understood that she can make small changes including walking daily, starting to save money to emergencies, and looking for employment. Patient understood that she gets overwhelmed easily and needs to make small changes to get started. Patient rated the session 10 out of 10 on the Session Rating Scale.   Patient engaged in session. She responded well to interventions. Patient continues to meet criteria for Major Depressive disorder, recurrent episode, moderate with anxious distress. Patient will continue in outpatient therapy due to being the least restrictive service to meet her needs. Patient made minimal progress on her goals at this time.   Suicidal/Homicidal: Negativewithout intent/plan  Therapist  Response: Therapist reviewed patient's recent thoughts and behaviors. Therapist utilized CBT to address mood. Therapist processed patient's feelings to identify triggers for mood. Therapist discussed with patient making small changes and not allowing anxiety to overwhelm her to the point she doesn't do anything. Therapist committed patient to make small changes.. Therapist administered the Outcome Rating Scale and the Session Rating Scale.  Plan: Return again in 2-3 weeks. Therapist will review patient goals on or before 12.21.2018  Diagnosis: Axis I: Major depressive disorder, recurrent episode, moderate with anxious distress    Axis II: No diagnosis    Glori Bickers, LCSW 10/31/2017

## 2017-11-03 ENCOUNTER — Other Ambulatory Visit (HOSPITAL_COMMUNITY)
Admission: RE | Admit: 2017-11-03 | Discharge: 2017-11-03 | Disposition: A | Discharge status: Home / Self Care | Payer: BLUE CROSS/BLUE SHIELD | Source: Ambulatory Visit | Attending: Family Medicine | Admitting: Family Medicine

## 2017-11-03 ENCOUNTER — Encounter: Payer: Self-pay | Admitting: Family Medicine

## 2017-11-03 ENCOUNTER — Ambulatory Visit (INDEPENDENT_AMBULATORY_CARE_PROVIDER_SITE_OTHER): Payer: BLUE CROSS/BLUE SHIELD | Admitting: Family Medicine

## 2017-11-03 VITALS — BP 112/82 | HR 105 | Resp 16 | Ht 68.0 in | Wt 327.0 lb

## 2017-11-03 DIAGNOSIS — Z1211 Encounter for screening for malignant neoplasm of colon: Secondary | ICD-10-CM

## 2017-11-03 DIAGNOSIS — Z0000 Encounter for general adult medical examination without abnormal findings: Secondary | ICD-10-CM

## 2017-11-03 DIAGNOSIS — Z124 Encounter for screening for malignant neoplasm of cervix: Secondary | ICD-10-CM | POA: Diagnosis not present

## 2017-11-03 LAB — POC HEMOCCULT BLD/STL (OFFICE/1-CARD/DIAGNOSTIC): FECAL OCCULT BLD: NEGATIVE

## 2017-11-03 MED ORDER — PHENTERMINE HCL 37.5 MG PO TABS: 37.5000 mg | ORAL_TABLET | Freq: Every day | ORAL | 2 refills | Status: DC

## 2017-11-03 MED ORDER — IBUPROFEN 800 MG PO TABS
ORAL_TABLET | ORAL | 2 refills | Status: DC
Start: 2017-11-03 — End: 2017-11-28

## 2017-11-03 NOTE — Patient Instructions (Addendum)
F/u in 3. months, call if you need me before Weight loss goal of 15 pounds   Keep up the good work as far as weight loss is concerned!  Ibuprofen one daily as needed, for knee pain is prescribed, weight loss will worl wonders  Need to return 3 stool cards for testing for hidden blood  No changes in medication  Please work on good  health habits so that your health will improve. 1. Commitment to daily physical activity for 30 to 60  minutes, if you are able to do this.  2. Commitment to wise food choices. Aim for half of your  food intake to be vegetable and fruit, one quarter starchy foods, and one quarter protein. Try to eat on a regular schedule  3 meals per day, snacking between meals should be limited to vegetables or fruits or small portions of nuts. 64 ounces of water per day is generally recommended, unless you have specific health conditions, like heart failure or kidney failure where you will need to limit fluid intake.  3. Commitment to sufficient and a  good quality of physical and mental rest daily, generally between 6 to 8 hours per day.  WITH PERSISTANCE AND PERSEVERANCE, THE IMPOSSIBLE , BECOMES THE NORM! It is important that you exercise regularly at least 30 minutes 5 times a week. If you develop chest pain, have severe difficulty breathing, or feel very tired, stop exercising immediately and seek medical attention

## 2017-11-04 ENCOUNTER — Encounter: Payer: Self-pay | Admitting: Family Medicine

## 2017-11-04 NOTE — Assessment & Plan Note (Signed)
Improved. Pt applauded on succesful weight loss through lifestyle change, and encouraged to continue same. Weight loss goal set for the next several months. Continue phentermine daily

## 2017-11-04 NOTE — Progress Notes (Signed)
Jennifer [name]     MRN: [id number]      DOB: [birth year]  HPI: Patient is in for annual physical exam. F/u weight loss on appetite suppressant with great effect, she has lost 5 pounds per month, and has changed food choice, feels very good about this. Recent labs, if available are reviewed. Immunization is reviewed , and  updated if needed.   PE:  BP 112/82   Pulse (!) 105   Resp 16   Ht 5\' 8"  (1.727 m)   Wt (!) 327 lb (148.3 kg)   SpO2 98%   BMI 49.72 kg/m   Pleasant  female, alert and oriented x 3, in no cardio-pulmonary distress. Afebrile. HEENT No facial trauma or asymetry. Sinuses non tender.  Extra occullar muscles intact, pupils equally reactive to light. External ears normal, tympanic membranes clear. Oropharynx moist, no exudate. Neck: supple, no adenopathy,JVD or thyromegaly.No bruits.  Chest: Clear to ascultation bilaterally.No crackles or wheezes. Non tender to palpation  Breast: No asymetry,no masses or lumps. No tenderness. No nipple discharge or inversion. No axillary or supraclavicular adenopathy  Cardiovascular system; Heart sounds normal,  S1 and  S2 ,no S3.  No murmur, or thrill. Apical beat not displaced Peripheral pulses normal.  Abdomen: Soft, non tender, no organomegaly or masses. No bruits. Bowel sounds normal. No guarding, tenderness or rebound.  Rectal:  Normal sphincter tone. No rectal mass. Guaiac negative stool.  GU: External genitalia normal female genitalia , normal female distribution of hair. No lesions. Urethral meatus normal in size, no  Prolapse, no lesions visibly  Present. Bladder non tender. Vagina pink and moist , with no visible lesions , discharge present . Adequate pelvic support no  cystocele or rectocele noted Cervix pink and appears healthy, no lesions or ulcerations noted, no discharge noted from os Uterus normal size, no adnexal masses, no cervical motion or adnexal tenderness.   Musculoskeletal  exam: Full ROM of spine, hips , shoulders and knees. No deformity ,swelling or crepitus noted. No muscle wasting or atrophy.   Neurologic: Cranial nerves 2 to 12 intact. Power, tone ,sensation and reflexes normal throughout. No disturbance in gait. No tremor.  Skin: Intact, no ulceration, erythema , scaling or rash noted. Pigmentation normal throughout  Psych; Normal mood and affect. Judgement and concentration normal   Assessment & Plan:  Annual physical exam Annual exam as documented. Counseling done  re healthy lifestyle involving commitment to 150 minutes exercise per week, heart healthy diet, and attaining healthy weight.The importance of adequate sleep also discussed. Regular seat belt use and home safety, is also discussed. Changes in health habits are decided on by the patient with goals and time frames  set for achieving them. Immunization and cancer screening needs are specifically addressed at this visit.   Morbid obesity (HCC) Improved. Pt applauded on succesful weight loss through lifestyle change, and encouraged to continue same. Weight loss goal set for the next several months. Continue phentermine daily

## 2017-11-04 NOTE — Assessment & Plan Note (Signed)

## 2017-11-10 LAB — CYTOLOGY - PAP
Adequacy: ABSENT
Diagnosis: NEGATIVE
HPV: NOT DETECTED

## 2017-11-14 ENCOUNTER — Encounter: Payer: Self-pay | Admitting: Family Medicine

## 2017-11-16 ENCOUNTER — Ambulatory Visit (INDEPENDENT_AMBULATORY_CARE_PROVIDER_SITE_OTHER): Payer: BLUE CROSS/BLUE SHIELD

## 2017-11-16 ENCOUNTER — Telehealth: Payer: Self-pay | Admitting: Family Medicine

## 2017-11-16 DIAGNOSIS — N3001 Acute cystitis with hematuria: Secondary | ICD-10-CM

## 2017-11-16 DIAGNOSIS — R350 Frequency of micturition: Secondary | ICD-10-CM

## 2017-11-16 LAB — POCT URINALYSIS DIPSTICK
Bilirubin, UA: NEGATIVE
Glucose, UA: NEGATIVE
Ketones, UA: NEGATIVE
Nitrite, UA: NEGATIVE
PH UA: 6 (ref 5.0–8.0)
Protein, UA: NEGATIVE
Spec Grav, UA: 1.01 (ref 1.010–1.025)
UROBILINOGEN UA: 0.2 U/dL

## 2017-11-16 NOTE — Telephone Encounter (Signed)
Patient is requesting an appt, with Dr.Simpson for a pulled muscle in her thigh area (she does not have an orthopedic) she also thinks she has a UTI or bladder infection, I advised her she can come in for a nurse visit today anytime before 4pm.  Cb#: [phone]

## 2017-11-17 ENCOUNTER — Other Ambulatory Visit: Payer: Self-pay | Admitting: Family Medicine

## 2017-11-17 LAB — URINE CULTURE
MICRO NUMBER:: 90005693
SPECIMEN QUALITY:: ADEQUATE

## 2017-11-17 MED ORDER — CYCLOBENZAPRINE HCL 10 MG PO TABS: 10.0000 mg | ORAL_TABLET | Freq: Every day | ORAL | 0 refills | Status: DC

## 2017-11-17 MED ORDER — PREDNISONE 5 MG PO TABS: 5.0000 mg | ORAL_TABLET | Freq: Two times a day (BID) | ORAL | 0 refills | Status: AC

## 2017-11-17 NOTE — Telephone Encounter (Signed)
I spoke with the pt, prednisone, flexeril as needed are sent in and she keeps appt for 1/14, will await urine c/s report

## 2017-11-28 ENCOUNTER — Ambulatory Visit: Payer: BLUE CROSS/BLUE SHIELD | Admitting: Family Medicine

## 2017-11-28 ENCOUNTER — Ambulatory Visit (HOSPITAL_COMMUNITY)
Admission: RE | Admit: 2017-11-28 | Discharge: 2017-11-28 | Disposition: A | Discharge status: Home / Self Care | Payer: BLUE CROSS/BLUE SHIELD | Source: Ambulatory Visit | Attending: Family Medicine | Admitting: Family Medicine

## 2017-11-28 ENCOUNTER — Encounter: Payer: Self-pay | Admitting: Family Medicine

## 2017-11-28 VITALS — BP 114/82 | HR 102 | Resp 16 | Ht 68.0 in | Wt 328.0 lb

## 2017-11-28 DIAGNOSIS — M5442 Lumbago with sciatica, left side: Secondary | ICD-10-CM

## 2017-11-28 DIAGNOSIS — I10 Essential (primary) hypertension: Secondary | ICD-10-CM

## 2017-11-28 DIAGNOSIS — F411 Generalized anxiety disorder: Secondary | ICD-10-CM | POA: Diagnosis not present

## 2017-11-28 DIAGNOSIS — M5136 Other intervertebral disc degeneration, lumbar region: Secondary | ICD-10-CM | POA: Diagnosis not present

## 2017-11-28 DIAGNOSIS — M549 Dorsalgia, unspecified: Secondary | ICD-10-CM | POA: Insufficient documentation

## 2017-11-28 DIAGNOSIS — M545 Low back pain: Secondary | ICD-10-CM | POA: Diagnosis not present

## 2017-11-28 IMAGING — DX DG LUMBAR SPINE COMPLETE 4+V
5 series · 5 of 5 positions shown · non-contrast
Comparison: Coronal and sagittal images through the lumbar spine
from an abdominal and pelvic CT scan [DATE]

CLINICAL DATA: Low back pain radiating into the left leg for the
past 2 weeks possibly related to a lifting injury.

EXAM:
LUMBAR SPINE - COMPLETE 4+ VIEW

[l-spine ap]
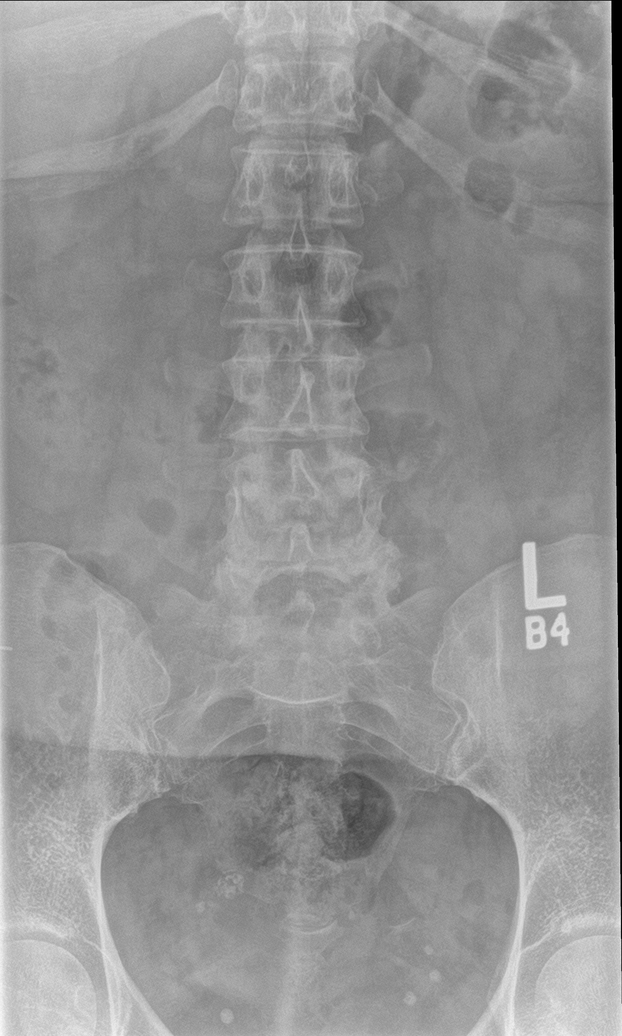

[l-spine obl (1 of 2)]
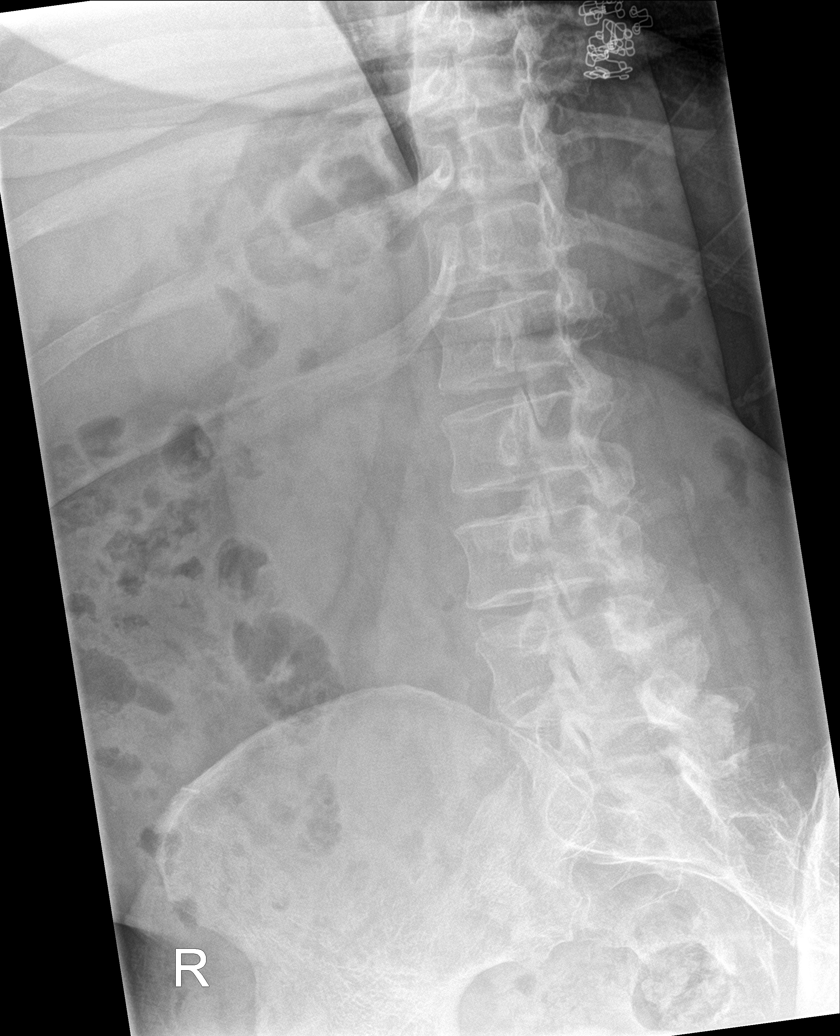

[l-spine obl (2 of 2)]
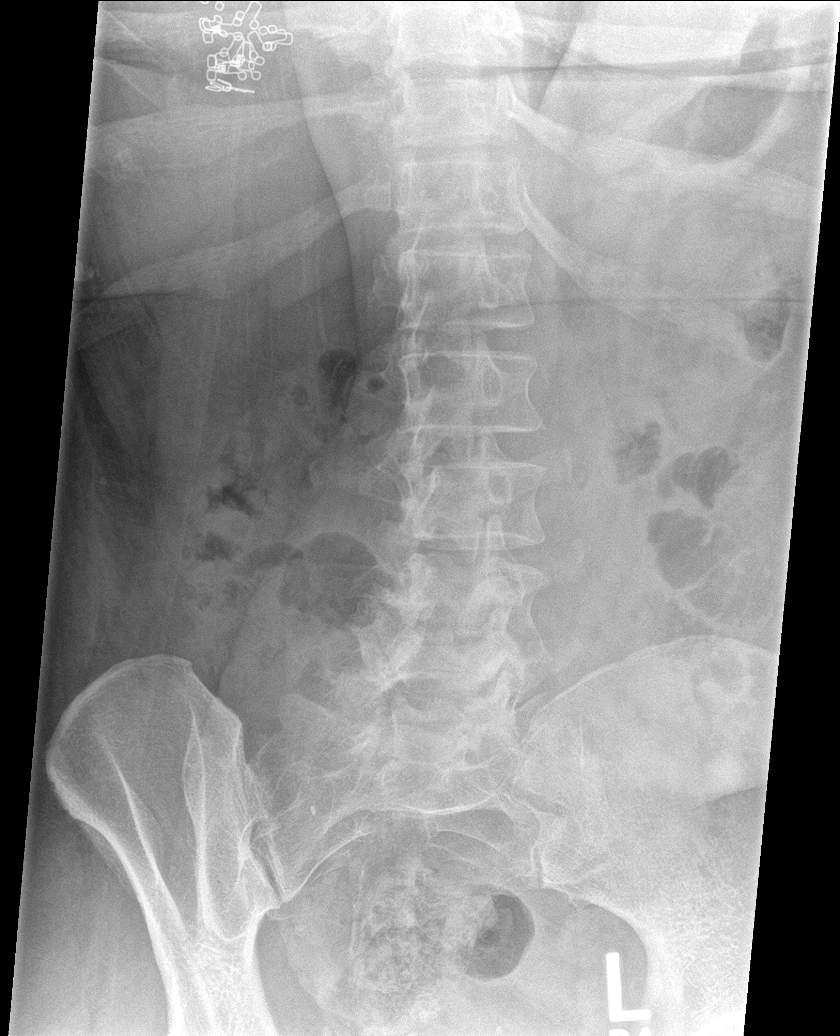

[l-spine lat]
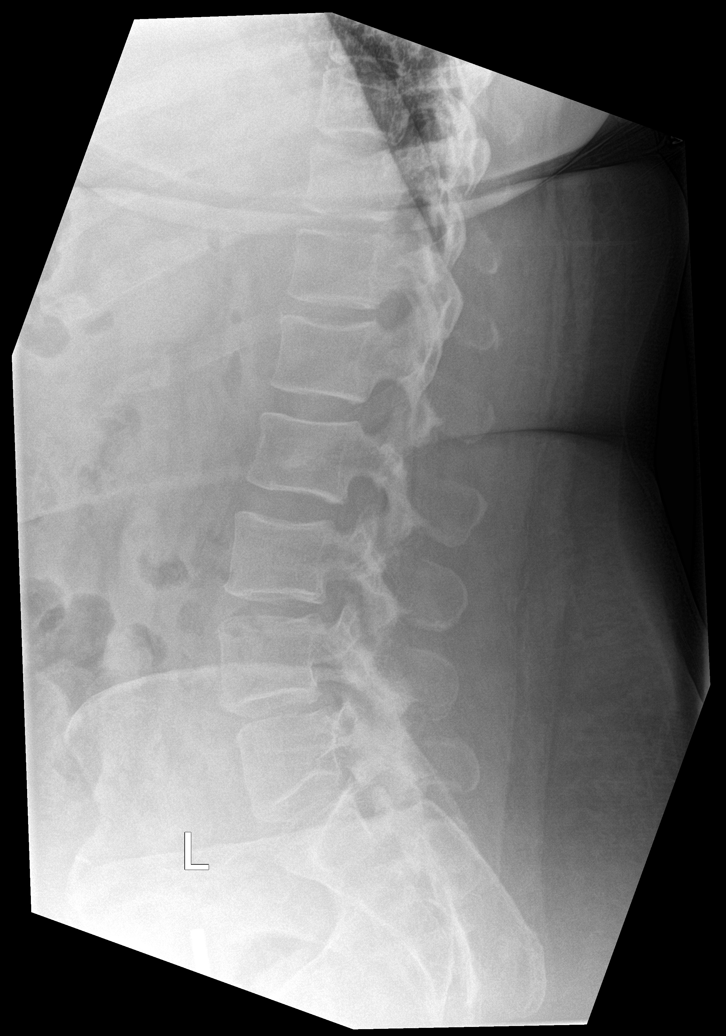

[l-spine spot]
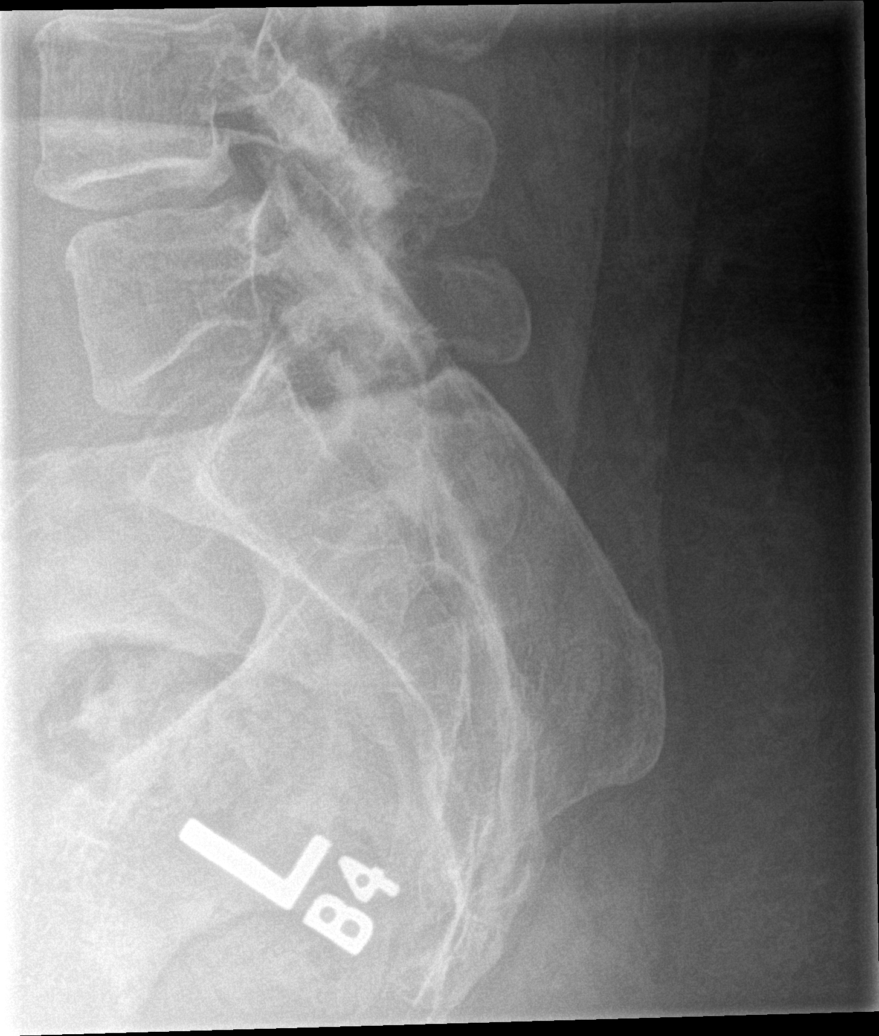

[5 of 5 positions shown; findings below may reference images not displayed]

FINDINGS: The lumbar vertebral bodies are preserved in height. The pedicles
and transverse processes are intact. There is mild disc space
narrowing at L4-5 which is stable. There is grade 1 anterolisthesis
of L4 with respect L5 which is more conspicuous than in the past.
There is facet joint hypertrophy at L5-S1 and at L4-5. The observed
portions of the sacrum are normal.
IMPRESSION: Degenerative disc disease at L4-5 has progressed slightly and there
has been interval development of mild grade 1 anterolisthesis at
this level. There is facet joint hypertrophy at L4-5 and at L5-S1.

## 2017-11-28 MED ORDER — METHYLPREDNISOLONE ACETATE 80 MG/ML IJ SUSP
80.0000 mg | Freq: Once | INTRAMUSCULAR | Status: AC
  Administered 2017-11-28: 80 mg via INTRAMUSCULAR

## 2017-11-28 MED ORDER — PREDNISONE 5 MG (21) PO TBPK: 5.0000 mg | ORAL_TABLET | ORAL | 0 refills | Status: DC

## 2017-11-28 MED ORDER — PHENTERMINE HCL 37.5 MG PO TABS: 37.5000 mg | ORAL_TABLET | Freq: Every day | ORAL | 2 refills | Status: DC

## 2017-11-28 MED ORDER — IBUPROFEN 800 MG PO TABS: 800.0000 mg | ORAL_TABLET | Freq: Three times a day (TID) | ORAL | 0 refills | Status: DC | PRN

## 2017-11-28 MED ORDER — VITAMIN D3 1.25 MG (50000 UT) PO CAPS: 50000.0000 [IU] | ORAL_CAPSULE | ORAL | 1 refills | Status: DC

## 2017-11-28 MED ORDER — RANITIDINE HCL 300 MG PO TABS: 300.0000 mg | ORAL_TABLET | Freq: Every day | ORAL | 0 refills | Status: DC

## 2017-11-28 MED ORDER — KETOROLAC TROMETHAMINE 60 MG/2ML IM SOLN
60.0000 mg | Freq: Once | INTRAMUSCULAR | Status: AC
  Administered 2017-11-28: 60 mg via INTRAMUSCULAR

## 2017-11-28 NOTE — Assessment & Plan Note (Signed)
Controlled, no change in medication DASH diet and commitment to daily physical activity for a minimum of 30 minutes discussed and encouraged, as a part of hypertension management. The importance of attaining a healthy weight is also discussed.  BP/Weight 11/28/2017 11/03/2017 09/01/2017 06/13/2017 12/08/2016 03/22/2016 42/70/6237  Systolic BP 628 315 176 160 737 106 269  Diastolic BP 82 82 86 98 86 86 80  Wt. (Lbs) 328 327 337 335.25 328 314 326  BMI 49.87 49.72 51.24 52.51 51.37 49.17 51.05

## 2017-11-28 NOTE — Progress Notes (Signed)
Jennifer [name]     MRN: [id number]      DOB: 05-17-1976   HPI Ms. Hoffmeyer is here with a 2 week h/o back and LLE pain to knee after she was pushing boxes an furniture excessively She has not had this before  Denies incontinence of stool or urine She denies weakness in lower extremities Requests dose increase to once daily phentermine states this is the more effective  Dose, will decrease dose if unable to sleeep ROS Denies recent fever or chills. Denies sinus pressure, nasal congestion, ear pain or sore throat. Denies chest congestion, productive cough or wheezing. Denies chest pains, palpitations and leg swelling Denies abdominal pain, nausea, vomiting,diarrhea or constipation.   Denies dysuria, frequency, hesitancy or incontinence. Denies headaches, seizures, numbness, or tingling. Denies uncontrolled  depression, anxiety or insomnia. Denies skin break down or rash.   PE  BP 114/82   Pulse (!) 102   Resp 16   Ht 5\' 8"  (1.727 m)   Wt (!) 328 lb (148.8 kg)   SpO2 98%   BMI 49.87 kg/m   Pt in pain  Patient alert and oriented and in no cardiopulmonary distress.  HEENT: No facial asymmetry, EOMI,   oropharynx pink and moist.  Neck supple no JVD, no mass.  Chest: Clear to auscultation bilaterally.  CVS: S1, S2 no murmurs, no S3.Regular rate.  ABD: Soft non tender.   Ext: No edema  MS: decreased  ROM lumbar spine,  Adequate in shoulders, hips and knees.  Skin: Intact, no ulcerations or rash noted.  Psych: Good eye contact, normal affect. Memory intact not anxious or depressed appearing.  CNS: CN 2-12 intact, power,  normal throughout.no focal deficits noted.   Assessment & Plan  Acute midline low back pain with left-sided sciatica Uncontrolled.Toradol and depo medrol administered IM in the office , to be followed by a short course of oral prednisone and NSAIDS. X ray of lumbar spine today   Morbid obesity (HCC) Deteriorated. Patient re-educated about  the  importance of commitment to a  minimum of 150 minutes of exercise per week.  The importance of healthy food choices with portion control discussed. Encouraged to start a food diary, count calories and to consider  joining a support group. Sample diet sheets offered. Goals set by the patient for the next several months.   Weight /BMI 11/28/2017 11/03/2017 09/01/2017  WEIGHT 328 lb 327 lb 337 lb  HEIGHT 5\' 8"  5\' 8"  5\' 8"   BMI 49.87 kg/m2 49.72 kg/m2 51.24 kg/m2   Increase phentermine dose to once daily   GAD (generalized anxiety disorder) Controlled, no change in medication   Essential hypertension Controlled, no change in medication DASH diet and commitment to daily physical activity for a minimum of 30 minutes discussed and encouraged, as a part of hypertension management. The importance of attaining a healthy weight is also discussed.  BP/Weight 11/28/2017 11/03/2017 09/01/2017 06/13/2017 12/08/2016 03/22/2016 10/28/2015  Systolic BP 114 112 122 130 130 136 120  Diastolic BP 82 82 86 98 86 86 80  Wt. (Lbs) 328 327 337 335.25 328 314 326  BMI 49.87 49.72 51.24 52.51 51.37 49.17 51.05

## 2017-11-28 NOTE — Patient Instructions (Signed)
F/u as before, call if you need me sooner  Injections  of toradol 60 mg IM and depo medrol 80 mg IM in office today , followed by ibuprofen, prednisone dose pack and zantac  For sciatica  X ray of low back today  Phentermine one daily script handed to you today  Commit to once weekly vit D

## 2017-11-28 NOTE — Assessment & Plan Note (Signed)
Deteriorated. Patient re-educated about  the importance of commitment to a  minimum of 150 minutes of exercise per week.  The importance of healthy food choices with portion control discussed. Encouraged to start a food diary, count calories and to consider  joining a support group. Sample diet sheets offered. Goals set by the patient for the next several months.   Weight /BMI 11/28/2017 11/03/2017 09/01/2017  WEIGHT 328 lb 327 lb 337 lb  HEIGHT 5\' 8"  5\' 8"  5\' 8"   BMI 49.87 kg/m2 49.72 kg/m2 51.24 kg/m2   Increase phentermine dose to once daily

## 2017-11-28 NOTE — Assessment & Plan Note (Signed)
Controlled, no change in medication  

## 2017-11-28 NOTE — Assessment & Plan Note (Addendum)
Uncontrolled.Toradol and depo medrol administered IM in the office , to be followed by a short course of oral prednisone and NSAIDS. X ray of lumbar spine today

## 2017-11-29 ENCOUNTER — Encounter: Payer: Self-pay | Admitting: Family Medicine

## 2017-11-29 ENCOUNTER — Ambulatory Visit (HOSPITAL_COMMUNITY): Payer: Self-pay | Admitting: Licensed Clinical Social Worker

## 2017-12-06 ENCOUNTER — Other Ambulatory Visit: Payer: Self-pay | Admitting: Family Medicine

## 2017-12-06 ENCOUNTER — Telehealth: Payer: Self-pay | Admitting: Family Medicine

## 2017-12-06 DIAGNOSIS — M5442 Lumbago with sciatica, left side: Secondary | ICD-10-CM

## 2017-12-06 MED ORDER — HYDROCODONE-ACETAMINOPHEN 5-325 MG PO TABS: ORAL_TABLET | ORAL | 0 refills | Status: DC

## 2017-12-06 NOTE — Telephone Encounter (Signed)
Hydrocodone 1 at bedtime prescribed for 5 days, and I recommend evaluation by orthopedics for uncontrolled back,  and hip pain if pain is severe and uncontrolled, pls let her know and fax printed script in your area. Either Dr Luna Glasgow or Aline Brochure recommended.or ortho in [place], pls enter referral to ortho of her choice , I will sign

## 2017-12-06 NOTE — Telephone Encounter (Signed)
Pt is still hurting from her hip to her knee. Is there something else we can do ,.

## 2017-12-07 NOTE — Telephone Encounter (Signed)
Will come collect medication today and referred to ortho in [place] per request

## 2017-12-09 ENCOUNTER — Ambulatory Visit (INDEPENDENT_AMBULATORY_CARE_PROVIDER_SITE_OTHER): Payer: BLUE CROSS/BLUE SHIELD | Admitting: Licensed Clinical Social Worker

## 2017-12-09 ENCOUNTER — Encounter (HOSPITAL_COMMUNITY): Payer: Self-pay | Admitting: Licensed Clinical Social Worker

## 2017-12-09 DIAGNOSIS — F331 Major depressive disorder, recurrent, moderate: Secondary | ICD-10-CM | POA: Diagnosis not present

## 2017-12-09 NOTE — Progress Notes (Signed)
   THERAPIST PROGRESS NOTE  Session Time: 9:00 am-9:50 am  Participation Level: Active  Behavioral Response: CasualAlertAnxious and Depressed  Type of Therapy: Individual Therapy  Treatment Goals addressed: Coping  Interventions: CBT and Solution Focused  Summary: Jennifer [name] is a 42 y.o. female who presents oriented x5 (person, place, situation, time and object), alert, average height, overweight, causally dressed,  and cooperative address mood. Patient has a history of medical treatment that includes chronic fatigue and hypertension. Patient has a minimal mental health treatment history including medication management. Patient denies symptoms of mania. Patient admits to passive thoughts of suicide and denies homicidal ideation. Patient denies psychosis including auditory and visual hallucinations. Patient denies substance abuse.  Physically: Patient reported that he is tired. Patient is having difficult. Patient reported that she slept about 1.5 hours. Patient is experiencing pain in her hip that shoots down her leg.  Spiritually/values: Patient reported that she has been going to church. She is praying more. Patient can tell a difference. Patient also feels like she is giving all of her time to her job rather than her family or self.  Relationships: Patient feels like her family is supportive now. She notes they are helping her out with childcare.  Emotional/Mental/Behavior: Patient is burned out. She feels like she can't continue to work two jobs anymore. Patient feels like she wants to make a change but is afraid to make a change due to a failure when she was younger. Patient understood that she can start to make a change in jobs by looking for new opportunities and updating her resume (low commitment steps).   Patient engaged in session. She responded well to interventions. Patient continues to meet criteria for Major Depressive disorder, recurrent episode, moderate with anxious  distress. Patient will continue in outpatient therapy due to being the least restrictive service to meet her needs. Patient made minimal progress on her goals at this time.   Suicidal/Homicidal: Negativewithout intent/plan  Therapist Response: Therapist reviewed patient's recent thoughts and behaviors. Therapist utilized CBT to address mood. Therapist processed patient's feelings to identify triggers for mood. Therapist discussed burnout with patient. Therapist assisted patient in identifying steps to take to make changes in her life.   Plan: Return again in 2-3 weeks. Therapist will review patient goals on or before 12.21.2018  Diagnosis: Axis I: Major depressive disorder, recurrent episode, moderate with anxious distress    Axis II: No diagnosis    Glori Bickers, LCSW 12/09/2017

## 2017-12-25 ENCOUNTER — Other Ambulatory Visit: Payer: Self-pay | Admitting: Family Medicine

## 2017-12-26 DIAGNOSIS — M170 Bilateral primary osteoarthritis of knee: Secondary | ICD-10-CM | POA: Diagnosis not present

## 2017-12-26 DIAGNOSIS — M47816 Spondylosis without myelopathy or radiculopathy, lumbar region: Secondary | ICD-10-CM | POA: Diagnosis not present

## 2018-01-03 ENCOUNTER — Ambulatory Visit (INDEPENDENT_AMBULATORY_CARE_PROVIDER_SITE_OTHER): Payer: BLUE CROSS/BLUE SHIELD | Admitting: Licensed Clinical Social Worker

## 2018-01-03 ENCOUNTER — Other Ambulatory Visit: Payer: Self-pay | Admitting: Family Medicine

## 2018-01-03 ENCOUNTER — Encounter (HOSPITAL_COMMUNITY): Payer: Self-pay | Admitting: Licensed Clinical Social Worker

## 2018-01-03 DIAGNOSIS — F411 Generalized anxiety disorder: Secondary | ICD-10-CM

## 2018-01-03 DIAGNOSIS — F331 Major depressive disorder, recurrent, moderate: Secondary | ICD-10-CM | POA: Diagnosis not present

## 2018-01-03 DIAGNOSIS — F418 Other specified anxiety disorders: Secondary | ICD-10-CM

## 2018-01-03 NOTE — Progress Notes (Signed)
   THERAPIST PROGRESS NOTE  Session Time: 9:00 am-9:50 am  Participation Level: Active  Behavioral Response: CasualAlertAnxious and Depressed  Type of Therapy: Individual Therapy  Treatment Goals addressed: Coping  Interventions: CBT and Solution Focused  Summary: Jennifer [name] is a 42 y.o. female who presents oriented x5 (person, place, situation, time and object), alert, average height, overweight, causally dressed,  and cooperative address mood. Patient has a history of medical treatment that includes chronic fatigue and hypertension. Patient has a minimal mental health treatment history including medication management. Patient denies symptoms of mania. Patient admits to passive thoughts of suicide and denies homicidal ideation. Patient denies psychosis including auditory and visual hallucinations. Patient denies substance abuse.  Physically: Patient reported that she continues to feel tired. She is experiencing no other aches or pains.  Spiritually/values: Patient wants to devote more time to the [place]. She is not attending church like she would like to. Patient feels like she could do a better job praying by praying twice a day.  Relationships: Patient feels like her relationships are going well.  Emotional/Mental/Behavior: Patient has had some negative experiences at work that are motivating her to look for a new job. Patient identified that he she is motivated by providing a better life for herself and her daughter. Patient admitted that she feels a lack of motivation. After discussion, patient understood that she is exhausted and she "gives" all day which makes it difficulty to make decisions as well as accomplish things.   Patient engaged in session. She responded well to interventions. Patient continues to meet criteria for Major Depressive disorder, recurrent episode, moderate with anxious distress. Patient will continue in outpatient therapy due to being the least restrictive service  to meet her needs. Patient made minimal progress on her goals at this time.   Suicidal/Homicidal: Negativewithout intent/plan  Therapist Response: Therapist reviewed patient's recent thoughts and behaviors. Therapist utilized CBT to address mood. Therapist processed patient's feelings to identify triggers for mood. Therapist assisted patient in identifying ways to improve her spirituality. Therapist discussed self care and feeling burned out.   Plan: Return again in 2-3 weeks. Therapist will review patient goals on or before 12.21.2018  Diagnosis: Axis I: Major depressive disorder, recurrent episode, moderate with anxious distress    Axis II: No diagnosis    Glori Bickers, LCSW 01/03/2018

## 2018-01-12 ENCOUNTER — Other Ambulatory Visit: Payer: Self-pay | Admitting: Family Medicine

## 2018-01-31 ENCOUNTER — Ambulatory Visit (HOSPITAL_COMMUNITY): Payer: Self-pay | Admitting: Licensed Clinical Social Worker

## 2018-02-02 ENCOUNTER — Other Ambulatory Visit: Payer: Self-pay | Admitting: Family Medicine

## 2018-02-06 ENCOUNTER — Encounter: Payer: Self-pay | Admitting: Family Medicine

## 2018-02-06 ENCOUNTER — Ambulatory Visit (INDEPENDENT_AMBULATORY_CARE_PROVIDER_SITE_OTHER): Payer: BLUE CROSS/BLUE SHIELD | Admitting: Family Medicine

## 2018-02-06 VITALS — BP 120/86 | HR 97 | Resp 16 | Ht 68.0 in | Wt 319.0 lb

## 2018-02-06 DIAGNOSIS — F411 Generalized anxiety disorder: Secondary | ICD-10-CM | POA: Diagnosis not present

## 2018-02-06 DIAGNOSIS — J309 Allergic rhinitis, unspecified: Secondary | ICD-10-CM | POA: Diagnosis not present

## 2018-02-06 DIAGNOSIS — R7303 Prediabetes: Secondary | ICD-10-CM

## 2018-02-06 DIAGNOSIS — R51 Headache: Secondary | ICD-10-CM

## 2018-02-06 DIAGNOSIS — F418 Other specified anxiety disorders: Secondary | ICD-10-CM

## 2018-02-06 DIAGNOSIS — R519 Headache, unspecified: Secondary | ICD-10-CM

## 2018-02-06 MED ORDER — PHENTERMINE HCL 37.5 MG PO TABS: 37.5000 mg | ORAL_TABLET | Freq: Every day | ORAL | 3 refills | Status: DC

## 2018-02-06 MED ORDER — FLUTICASONE PROPIONATE 50 MCG/ACT NA SUSP: 2.0000 | Freq: Every day | NASAL | 6 refills | Status: DC

## 2018-02-06 NOTE — Progress Notes (Signed)
Jennifer [name]     MRN: [id number]      DOB: Mar 28, 1976   HPI Jennifer [name] is here for follow up and re-evaluation of chronic medical conditions,  In particular weight loss and medication management and review of any available recent lab and radiology data.  Preventive health is updated, specifically  Cancer screening and Immunization.    1 week h/o headaches she has had headaches I the past, increased frontal pressure with excess phlegm in throat no fever or chills  Left ear pain and left facial pain and left jaw pain Yesterday her headache so bad that she had to pull off the road , poor sleep for 2 days and stressed  ROS Denies recent fever or chills. Denies chest congestion, productive cough or wheezing. Denies chest pains, palpitations and leg swelling Denies abdominal pain, nausea, vomiting,diarrhea or constipation.   Denies dysuria, frequency, hesitancy or incontinence. C/o chronic knee pain and stiffness limiting mobility. Denies , seizures, numbness, or tingling. . Denies skin break down or rash.   PE  BP 120/86   Pulse 97   Resp 16   Ht 5\' 8"  (1.727 m)   Wt (!) 319 lb (144.7 kg)   SpO2 97%   BMI 48.50 kg/m   Patient alert and oriented and in no cardiopulmonary distress.  HEENT: No facial asymmetry, EOMI,   oropharynx pink and moist.  Neck supple no JVD, no mass.PERTL, nasal mucosa erythematous and edematous, TM clear, no cervical adenopathy Chest: Clear to auscultation bilaterally.  CVS: S1, S2 no murmurs, no S3.Regular rate.  ABD: Soft non tender.   Ext: No edema  MS: Adequate ROM spine, shoulders, hips and reduced in  knees.  Skin: Intact, no ulcerations or rash noted.  Psych: Good eye contact, . Memory intact  anxious tearful and  depressed appearing.  CNS: CN 2-12 intact, power,  normal throughout.no focal deficits noted.   Assessment & Plan  GAD (generalized anxiety disorder) Uncontrolled anxiety despite therapy and medication , refer to  psychiatry  Morbid obesity (HCC) Improved, pt congratulated and encouraged to continue her current plan Patient re-educated about  the importance of commitment to a  minimum of 150 minutes of exercise per week.  The importance of healthy food choices with portion control discussed. Encouraged to start a food diary, count calories and to consider  joining a support group. Sample diet sheets offered. Goals set by the patient for the next several months.   Weight /BMI 02/06/2018 11/28/2017 11/03/2017  WEIGHT 319 lb 328 lb 327 lb  HEIGHT 5\' 8"  5\' 8"  5\' 8"   BMI 48.5 kg/m2 49.87 kg/m2 49.72 kg/m2      Allergic rhinitis Increased and uncontrolled contributing to headaches x 1 week, pt to start using daily nasal steroid  Daily headache New daily headache x 1 week attributed to stress, poor sleep and uncontrolled allergies, history and exam are not concerniung for underlying neurologic problem She will call back if no improvement

## 2018-02-06 NOTE — Patient Instructions (Addendum)
F/u in  3.5 months, call if you need me before  HBA1C and chem 7 and eGFR today,  You are being referred to psychiatry and also to Dr Garwin Brothers for heavy bleeding  Congrats on weight loss, keep it up  Continue phentermine one daily  Flonase is prescribed for allergies which have been causing your headache   It is important that you exercise regularly at least 30 minutes 5 times a week. If you develop chest pain, have severe difficulty breathing, or feel very tired, stop exercising immediately and seek medical attention

## 2018-02-07 ENCOUNTER — Encounter: Payer: Self-pay | Admitting: Family Medicine

## 2018-02-07 LAB — BASIC METABOLIC PANEL WITH GFR
BUN: 12 mg/dL (ref 7–25)
CALCIUM: 9.6 mg/dL (ref 8.6–10.2)
CHLORIDE: 101 mmol/L (ref 98–110)
CO2: 29 mmol/L (ref 20–32)
CREATININE: 0.72 mg/dL (ref 0.50–1.10)
GFR, Est African American: 119 mL/min/{1.73_m2} (ref >=60)
GFR, Est Non African American: 103 mL/min/{1.73_m2} (ref >=60)
Glucose, Bld: 84 mg/dL (ref 65–139)
Potassium: 3.7 mmol/L (ref 3.5–5.3)
Sodium: 139 mmol/L (ref 135–146)

## 2018-02-07 LAB — HEMOGLOBIN A1C
EAG (MMOL/L): 7.1 (calc)
Hgb A1c MFr Bld: 6.1 % of total Hgb — ABNORMAL HIGH (ref <5.7)
Mean Plasma Glucose: 128 (calc)

## 2018-02-09 ENCOUNTER — Encounter (HOSPITAL_COMMUNITY): Payer: Self-pay | Admitting: Licensed Clinical Social Worker

## 2018-02-09 ENCOUNTER — Ambulatory Visit (INDEPENDENT_AMBULATORY_CARE_PROVIDER_SITE_OTHER): Payer: BLUE CROSS/BLUE SHIELD | Admitting: Licensed Clinical Social Worker

## 2018-02-09 DIAGNOSIS — F331 Major depressive disorder, recurrent, moderate: Secondary | ICD-10-CM | POA: Diagnosis not present
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
## 2018-02-13 DIAGNOSIS — M47816 Spondylosis without myelopathy or radiculopathy, lumbar region: Secondary | ICD-10-CM | POA: Diagnosis not present

## 2018-02-13 DIAGNOSIS — M170 Bilateral primary osteoarthritis of knee: Secondary | ICD-10-CM | POA: Diagnosis not present

## 2018-02-17 ENCOUNTER — Encounter: Payer: Self-pay | Admitting: Family Medicine

## 2018-02-17 DIAGNOSIS — R51 Headache: Secondary | ICD-10-CM

## 2018-02-17 DIAGNOSIS — R519 Headache, unspecified: Secondary | ICD-10-CM | POA: Insufficient documentation

## 2018-02-17 NOTE — Assessment & Plan Note (Signed)
New daily headache x 1 week attributed to stress, poor sleep and uncontrolled allergies, history and exam are not concerniung for underlying neurologic problem She will call back if no improvement

## 2018-02-17 NOTE — Assessment & Plan Note (Signed)
Increased and uncontrolled contributing to headaches x 1 week, pt to start using daily nasal steroid

## 2018-02-17 NOTE — Assessment & Plan Note (Signed)
Improved, pt congratulated and encouraged to continue her current plan Patient re-educated about  the importance of commitment to a  minimum of 150 minutes of exercise per week.  The importance of healthy food choices with portion control discussed. Encouraged to start a food diary, count calories and to consider  joining a support group. Sample diet sheets offered. Goals set by the patient for the next several months.   Weight /BMI 02/06/2018 11/28/2017 11/03/2017  WEIGHT 319 lb 328 lb 327 lb  HEIGHT 5\' 8"  5\' 8"  5\' 8"   BMI 48.5 kg/m2 49.87 kg/m2 49.72 kg/m2

## 2018-02-17 NOTE — Assessment & Plan Note (Signed)
Uncontrolled anxiety despite therapy and medication , refer to psychiatry

## 2018-02-22 ENCOUNTER — Ambulatory Visit (HOSPITAL_COMMUNITY): Payer: Self-pay | Admitting: Licensed Clinical Social Worker

## 2018-02-24 ENCOUNTER — Encounter: Payer: Self-pay | Admitting: Family Medicine

## 2018-02-27 ENCOUNTER — Encounter: Payer: Self-pay | Admitting: Family Medicine

## 2018-02-28 ENCOUNTER — Encounter: Payer: Self-pay | Admitting: Family Medicine

## 2018-03-01 ENCOUNTER — Other Ambulatory Visit: Payer: Self-pay | Admitting: Family Medicine

## 2018-03-01 MED ORDER — VALACYCLOVIR HCL 500 MG PO TABS: 500.0000 mg | ORAL_TABLET | Freq: Two times a day (BID) | ORAL | 2 refills | Status: DC

## 2018-03-01 MED ORDER — RANITIDINE HCL 300 MG PO TABS: 300.0000 mg | ORAL_TABLET | Freq: Every day | ORAL | 5 refills | Status: DC

## 2018-03-01 NOTE — Progress Notes (Signed)
Ranitidine 

## 2018-03-06 ENCOUNTER — Other Ambulatory Visit: Payer: Self-pay | Admitting: Family Medicine

## 2018-03-06 DIAGNOSIS — F411 Generalized anxiety disorder: Secondary | ICD-10-CM

## 2018-03-06 DIAGNOSIS — F418 Other specified anxiety disorders: Secondary | ICD-10-CM

## 2018-03-14 ENCOUNTER — Ambulatory Visit (HOSPITAL_COMMUNITY): Payer: BLUE CROSS/BLUE SHIELD | Admitting: Licensed Clinical Social Worker

## 2018-03-28 ENCOUNTER — Ambulatory Visit (HOSPITAL_COMMUNITY): Payer: BLUE CROSS/BLUE SHIELD | Admitting: Licensed Clinical Social Worker

## 2018-04-11 ENCOUNTER — Ambulatory Visit (HOSPITAL_COMMUNITY): Payer: Self-pay | Admitting: Licensed Clinical Social Worker

## 2018-05-04 NOTE — Telephone Encounter (Signed)
Error in charting.

## 2018-05-10 ENCOUNTER — Other Ambulatory Visit: Payer: Self-pay | Admitting: Family Medicine

## 2018-05-23 ENCOUNTER — Ambulatory Visit: Payer: Self-pay | Admitting: Family Medicine

## 2018-06-03 ENCOUNTER — Other Ambulatory Visit: Payer: Self-pay | Admitting: Family Medicine

## 2018-06-12 ENCOUNTER — Other Ambulatory Visit (HOSPITAL_COMMUNITY)
Admission: RE | Admit: 2018-06-12 | Discharge: 2018-06-12 | Disposition: A | Discharge status: Home / Self Care | Payer: BLUE CROSS/BLUE SHIELD | Source: Ambulatory Visit | Attending: Family Medicine | Admitting: Family Medicine

## 2018-06-12 ENCOUNTER — Encounter: Payer: Self-pay | Admitting: Family Medicine

## 2018-06-12 ENCOUNTER — Ambulatory Visit (INDEPENDENT_AMBULATORY_CARE_PROVIDER_SITE_OTHER): Payer: BLUE CROSS/BLUE SHIELD | Admitting: Family Medicine

## 2018-06-12 ENCOUNTER — Other Ambulatory Visit: Payer: Self-pay

## 2018-06-12 VITALS — BP 120/80 | HR 98 | Resp 14 | Ht 67.0 in | Wt 316.1 lb

## 2018-06-12 DIAGNOSIS — Z113 Encounter for screening for infections with a predominantly sexual mode of transmission: Secondary | ICD-10-CM | POA: Diagnosis not present

## 2018-06-12 DIAGNOSIS — I10 Essential (primary) hypertension: Secondary | ICD-10-CM

## 2018-06-12 DIAGNOSIS — Z13220 Encounter for screening for lipoid disorders: Secondary | ICD-10-CM

## 2018-06-12 DIAGNOSIS — N760 Acute vaginitis: Secondary | ICD-10-CM | POA: Insufficient documentation

## 2018-06-12 DIAGNOSIS — R7303 Prediabetes: Secondary | ICD-10-CM

## 2018-06-12 DIAGNOSIS — E559 Vitamin D deficiency, unspecified: Secondary | ICD-10-CM | POA: Diagnosis not present

## 2018-06-12 DIAGNOSIS — Z1231 Encounter for screening mammogram for malignant neoplasm of breast: Secondary | ICD-10-CM

## 2018-06-12 MED ORDER — PHENTERMINE HCL 37.5 MG PO TABS
37.5000 mg | ORAL_TABLET | Freq: Every day | ORAL | 3 refills | Status: DC
Start: 2018-06-12 — End: 2018-10-19

## 2018-06-12 NOTE — Patient Instructions (Addendum)
F/U in 4 months, call if you need me before  Please schedule mammogram at checkout   You are being prescribed phentermine one daily, weigth loss goal of  10 to 12 pounds    Labs today Vit D, TSH, HIV, and chem 7  Urine from office today for wet prep and GC and chlamydia culture   Fasting lipid,  And HBA1C  5 days before next vist

## 2018-06-12 NOTE — Assessment & Plan Note (Addendum)
1 week h/o brown vaginal discharge , malodorous , wants STD testing, urine specimen to be tested

## 2018-06-13 LAB — BASIC METABOLIC PANEL
BUN: 12 mg/dL (ref 7–25)
CHLORIDE: 102 mmol/L (ref 98–110)
CO2: 27 mmol/L (ref 20–32)
Calcium: 9.7 mg/dL (ref 8.6–10.2)
Creat: 0.73 mg/dL (ref 0.50–1.10)
GLUCOSE: 82 mg/dL (ref 65–139)
Potassium: 3.6 mmol/L (ref 3.5–5.3)
SODIUM: 139 mmol/L (ref 135–146)

## 2018-06-13 LAB — TSH: TSH: 2.35 m[IU]/L

## 2018-06-13 LAB — HIV ANTIBODY (ROUTINE TESTING W REFLEX): HIV 1&2 Ab, 4th Generation: NONREACTIVE

## 2018-06-13 LAB — VITAMIN D 25 HYDROXY (VIT D DEFICIENCY, FRACTURES): VIT D 25 HYDROXY: 46 ng/mL (ref 30–100)

## 2018-06-14 LAB — URINE CYTOLOGY ANCILLARY ONLY
CHLAMYDIA, DNA PROBE: NEGATIVE
Neisseria Gonorrhea: NEGATIVE
Trichomonas: NEGATIVE

## 2018-06-16 ENCOUNTER — Encounter: Payer: Self-pay | Admitting: Family Medicine

## 2018-06-16 ENCOUNTER — Ambulatory Visit (HOSPITAL_COMMUNITY)
Admission: RE | Admit: 2018-06-16 | Discharge: 2018-06-16 | Disposition: A | Discharge status: Home / Self Care | Payer: BLUE CROSS/BLUE SHIELD | Source: Ambulatory Visit | Attending: Family Medicine | Admitting: Family Medicine

## 2018-06-16 ENCOUNTER — Telehealth: Payer: Self-pay

## 2018-06-16 DIAGNOSIS — Z1231 Encounter for screening mammogram for malignant neoplasm of breast: Secondary | ICD-10-CM

## 2018-06-16 IMAGING — MG DIGITAL SCREENING BILATERAL MAMMOGRAM WITH TOMO AND CAD
6 of 9 series · 6 of 25 positions shown · non-contrast
Comparison: Previous exam(s).

ACR Breast Density Category a: The breast tissue is almost entirely
fatty.

CLINICAL DATA: Screening.

EXAM:
DIGITAL SCREENING BILATERAL MAMMOGRAM WITH TOMO AND CAD

[L MLO]
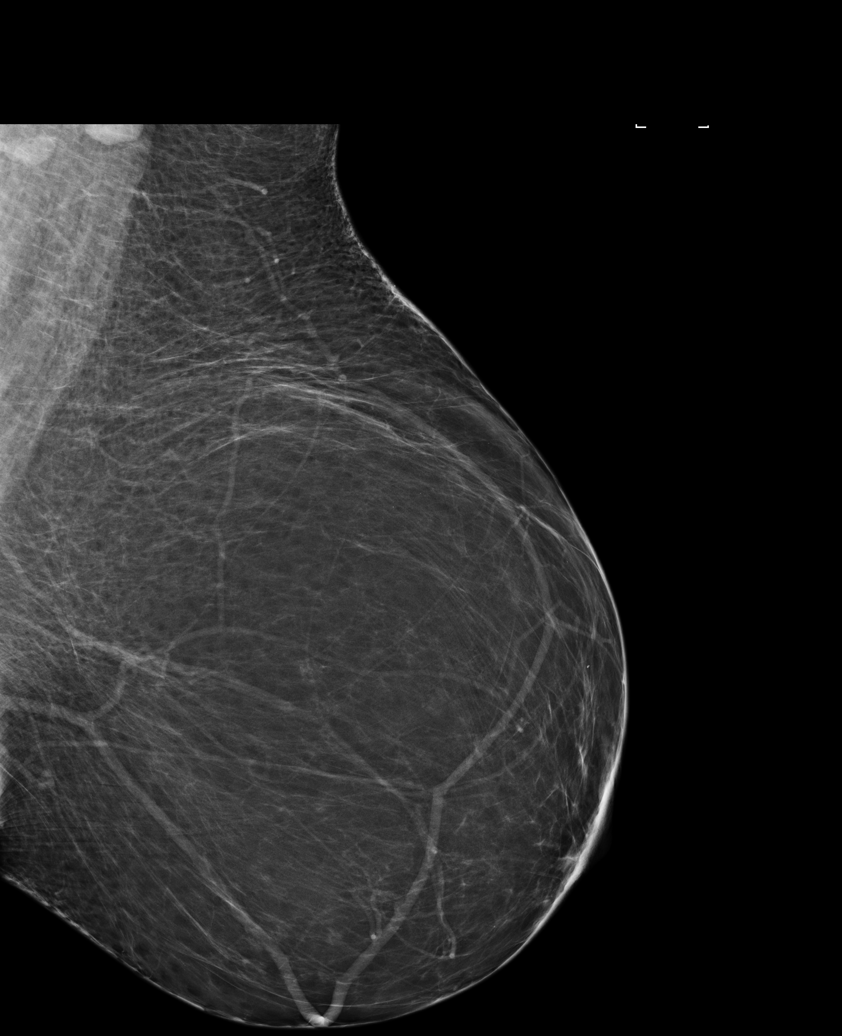

[L MLO synth-2D]
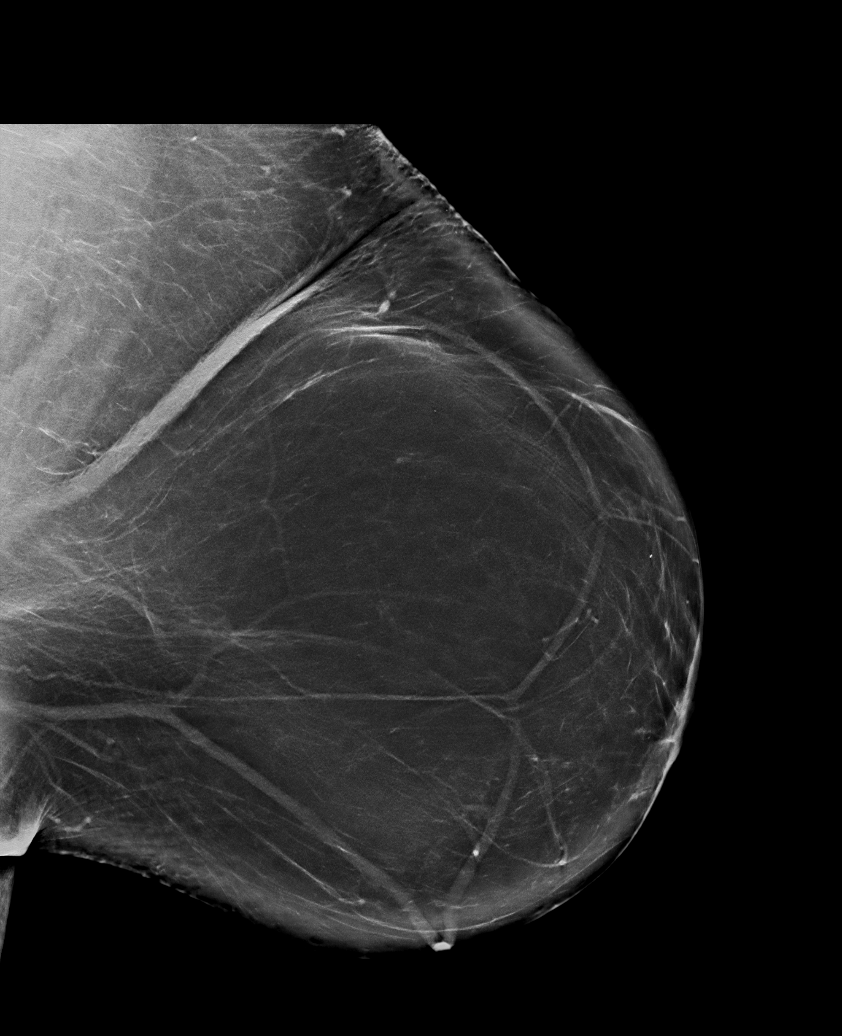

[R CC synth-2D]
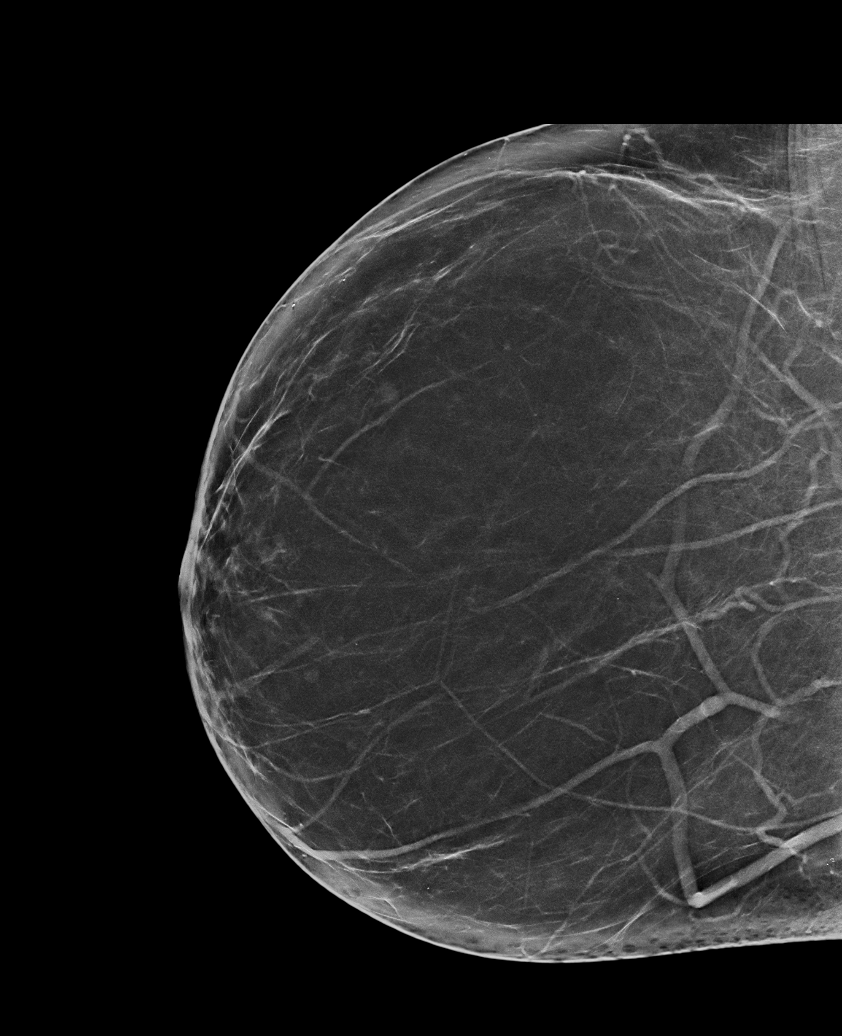

[L CC synth-2D]
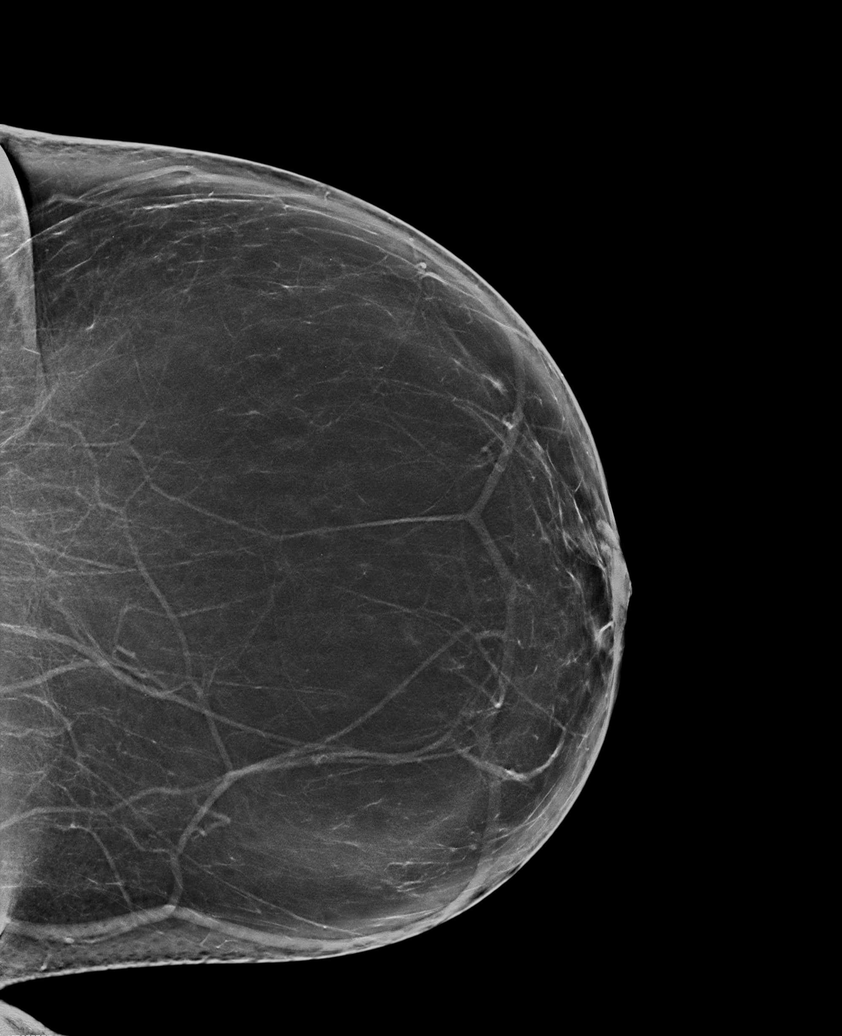

[R MLO synth-2D]
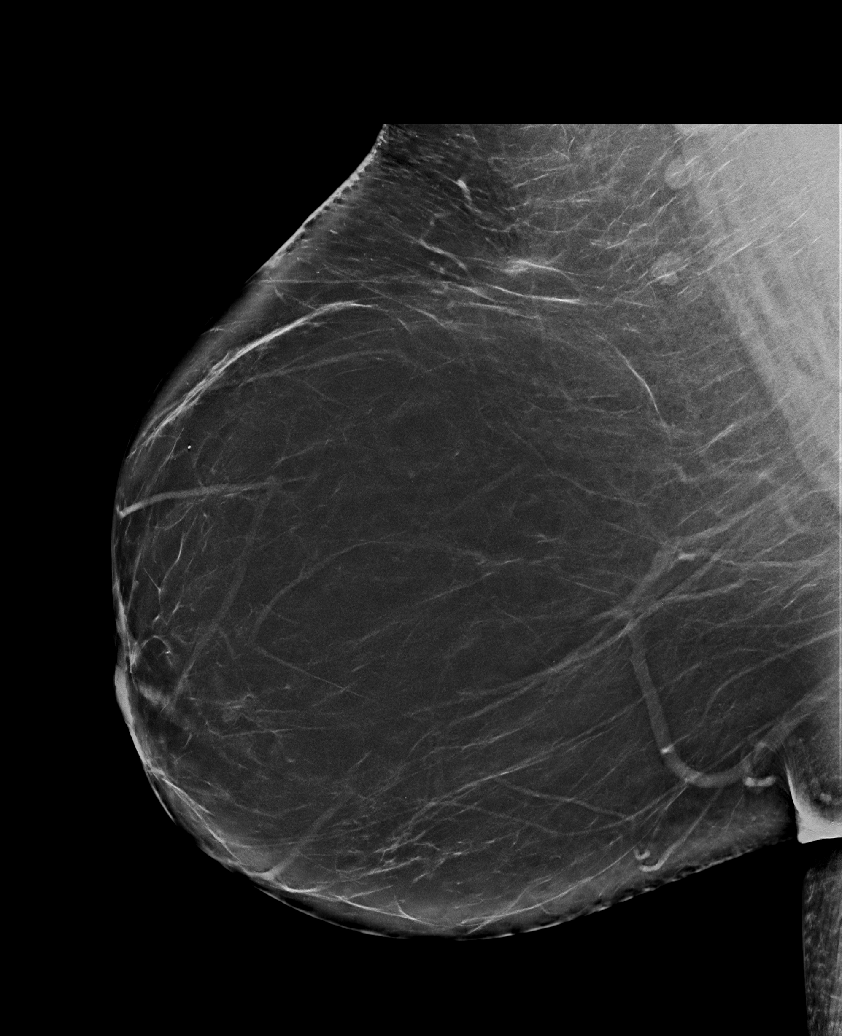

[L MLO tomo · tomo slice 53/104.0]
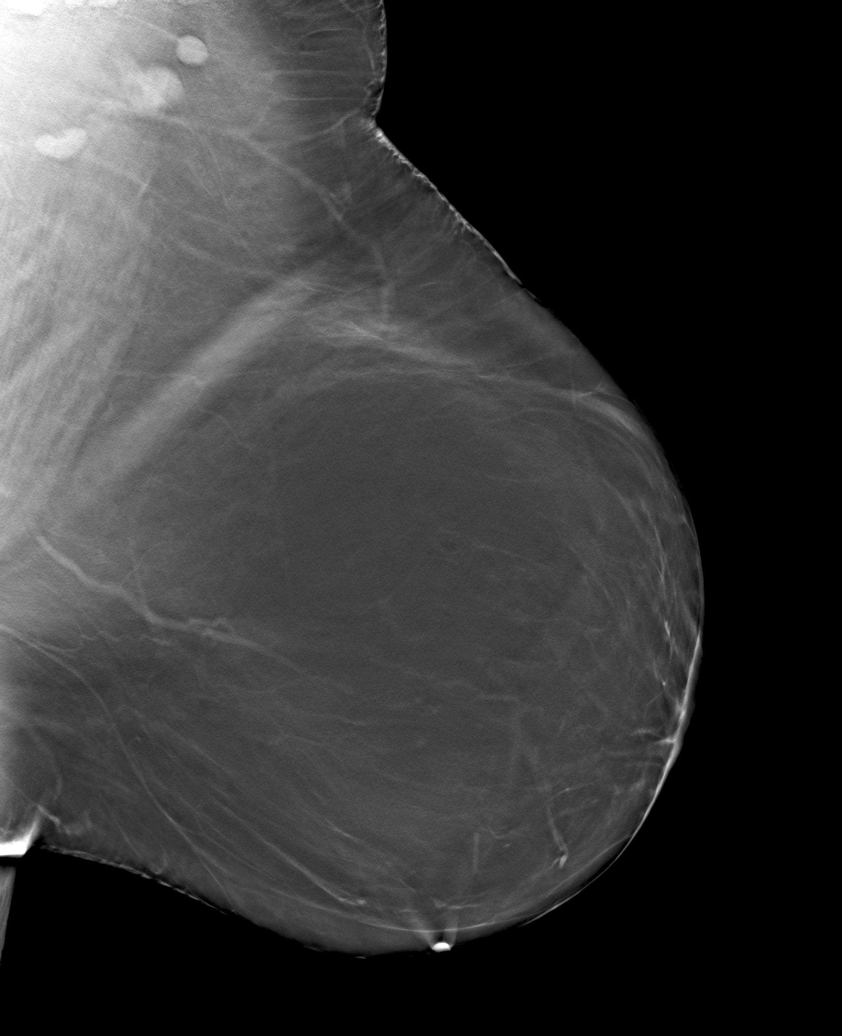

[6 of 25 positions shown; findings below may reference images not displayed]

FINDINGS: There are no findings suspicious for malignancy. Images were
processed with CAD.
IMPRESSION: No mammographic evidence of malignancy. A result letter of this
screening mammogram will be mailed directly to the patient.

RECOMMENDATION:
Screening mammogram in one year. (Code:[TA])

BI-RADS CATEGORY  1: Negative.

## 2018-06-16 NOTE — Telephone Encounter (Signed)
Called patient to let her know that the part of her urine checking for yeast takes a little longer to process because it has to be sent out to a different lab if it is a urine sample. We should hopefully get a result either today or tomorrow. She verbalized understanding.

## 2018-06-18 ENCOUNTER — Encounter: Payer: Self-pay | Admitting: Family Medicine

## 2018-06-18 ENCOUNTER — Other Ambulatory Visit: Payer: Self-pay | Admitting: Family Medicine

## 2018-06-18 LAB — URINE CYTOLOGY ANCILLARY ONLY: Candida vaginitis: NEGATIVE

## 2018-06-18 MED ORDER — METRONIDAZOLE 500 MG PO TABS: 500.0000 mg | ORAL_TABLET | Freq: Two times a day (BID) | ORAL | 0 refills | Status: DC

## 2018-06-18 MED ORDER — FLUCONAZOLE 150 MG PO TABS: ORAL_TABLET | ORAL | 0 refills | Status: DC

## 2018-06-25 ENCOUNTER — Encounter: Payer: Self-pay | Admitting: Family Medicine

## 2018-06-25 NOTE — Assessment & Plan Note (Signed)
Controlled, no change in medication DASH diet and commitment to daily physical activity for a minimum of 30 minutes discussed and encouraged, as a part of hypertension management. The importance of attaining a healthy weight is also discussed.  BP/Weight 06/12/2018 02/06/2018 11/28/2017 11/03/2017 09/01/2017 06/13/2017 0/51/1021  Systolic BP 117 356 701 410 301 314 388  Diastolic BP 80 86 82 82 86 98 86  Wt. (Lbs) 316.12 319 328 327 337 335.25 328  BMI 49.51 48.5 49.87 49.72 51.24 52.51 51.37

## 2018-06-25 NOTE — Assessment & Plan Note (Signed)
Overall improving, unfortunately not at the desired rate, states she recently overdid "bad foods" for her birhtday Patient re-educated about  the importance of commitment to a  minimum of 150 minutes of exercise per week.  The importance of healthy food choices with portion control discussed. Encouraged to start a food diary, count calories and to consider  joining a support group. Sample diet sheets offered. Goals set by the patient for the next several months. Take phentermine 1 daily   Weight /BMI 06/12/2018 02/06/2018 11/28/2017  WEIGHT 316 lb 1.9 oz 319 lb 328 lb  HEIGHT 5\' 7"  5\' 8"  5\' 8"   BMI 49.51 kg/m2 48.5 kg/m2 49.87 kg/m2

## 2018-06-25 NOTE — Progress Notes (Signed)
Jennifer [name]     MRN: [id number]      DOB: 25-Apr-1976   HPI Jennifer [name] is here for follow up and re-evaluation of chronic medical conditions, medication management and review of any available recent lab and radiology data.  Preventive health is updated, specifically  Cancer screening and Immunization.    The PT denies any adverse reactions to current medications since the last visit. Very satisfied with her response to phentermine C/o malodorous vaginal discharge, wants STD testing on vaginal specimen ROS Denies recent fever or chills. Denies sinus pressure, nasal congestion, ear pain or sore throat. Denies chest congestion, productive cough or wheezing. Denies chest pains, palpitations and leg swelling Denies abdominal pain, nausea, vomiting,diarrhea or constipation.    Denies joint pain, swelling and limitation in mobility. Denies headaches, seizures, numbness, or tingling. Denies uncontrolled depression, anxiety or insomnia.Needs to return to therapy, overwhelmed with bills and raising her daughter Denies skin break down or rash.   PE  BP 120/80 (BP Location: Left Arm, Patient Position: Sitting) Comment (Cuff Size): thigh  Pulse 98   Resp 14   Ht 5\' 7"  (1.702 m)   Wt (!) 316 lb 1.9 oz (143.4 kg)   SpO2 97%   BMI 49.51 kg/m   Patient alert and oriented and in no cardiopulmonary distress.  HEENT: No facial asymmetry, EOMI,   oropharynx pink and moist.  Neck supple no JVD, no mass.  Chest: Clear to auscultation bilaterally.  CVS: S1, S2 no murmurs, no S3.Regular rate.  ABD: Soft non tender.   Ext: No edema  MS: Adequate ROM spine, shoulders, hips and knees.  Skin: Intact, no ulcerations or rash noted.  Psych: Good eye contact, normal affect. Memory intact not anxious or depressed appearing.  CNS: CN 2-12 intact, power,  normal throughout.no focal deficits noted.   Assessment & Plan  Vulvovaginitis 1 week h/o brown vaginal discharge , malodorous , wants STD  testing, urine specimen to be tested  Morbid obesity (HCC) Overall improving, unfortunately not at the desired rate, states she recently overdid "bad foods" for her birhtday Patient re-educated about  the importance of commitment to a  minimum of 150 minutes of exercise per week.  The importance of healthy food choices with portion control discussed. Encouraged to start a food diary, count calories and to consider  joining a support group. Sample diet sheets offered. Goals set by the patient for the next several months. Take phentermine 1 daily   Weight /BMI 06/12/2018 02/06/2018 11/28/2017  WEIGHT 316 lb 1.9 oz 319 lb 328 lb  HEIGHT 5\' 7"  5\' 8"  5\' 8"   BMI 49.51 kg/m2 48.5 kg/m2 49.87 kg/m2      Essential hypertension Controlled, no change in medication DASH diet and commitment to daily physical activity for a minimum of 30 minutes discussed and encouraged, as a part of hypertension management. The importance of attaining a healthy weight is also discussed.  BP/Weight 06/12/2018 02/06/2018 11/28/2017 11/03/2017 09/01/2017 06/13/2017 12/08/2016  Systolic BP 120 120 114 112 122 130 130  Diastolic BP 80 86 82 82 86 98 86  Wt. (Lbs) 316.12 319 328 327 337 335.25 328  BMI 49.51 48.5 49.87 49.72 51.24 52.51 51.37       Prediabetes Patient educated about the importance of limiting  Carbohydrate intake , the need to commit to daily physical activity for a minimum of 30 minutes , and to commit weight loss. The fact that changes in all these areas will reduce or eliminate all  together the development of diabetes is stressed.  Updated lab needed at/ before next visit.   Diabetic Labs Latest Ref Rng & Units 06/12/2018 02/06/2018 09/01/2017 12/08/2016 03/22/2016  HbA1c <5.7 % of total Hgb - 6.1(H) 6.1(H) 5.7(H) 6.0(H)  Chol <200 mg/dL - - - 536 -  HDL >64 mg/dL - - - 59 -  Calc LDL <403 mg/dL - - - 60 -  Triglycerides <150 mg/dL - - - 46 -  Creatinine 0.50 - 1.10 mg/dL 4.74 2.59 5.63 8.75 6.43    BP/Weight 06/12/2018 02/06/2018 11/28/2017 11/03/2017 09/01/2017 06/13/2017 12/08/2016  Systolic BP 120 120 114 112 122 130 130  Diastolic BP 80 86 82 82 86 98 86  Wt. (Lbs) 316.12 319 328 327 337 335.25 328  BMI 49.51 48.5 49.87 49.72 51.24 52.51 51.37   No flowsheet data found.

## 2018-06-25 NOTE — Assessment & Plan Note (Signed)
Patient educated about the importance of limiting  Carbohydrate intake , the need to commit to daily physical activity for a minimum of 30 minutes , and to commit weight loss. The fact that changes in all these areas will reduce or eliminate all together the development of diabetes is stressed.  Updated lab needed at/ before next visit.   Diabetic Labs Latest Ref Rng & Units 06/12/2018 02/06/2018 09/01/2017 12/08/2016 03/22/2016  HbA1c <5.7 % of total Hgb - 6.1(H) 6.1(H) 5.7(H) 6.0(H)  Chol <200 mg/dL - - - 128 -  HDL >50 mg/dL - - - 59 -  Calc LDL <100 mg/dL - - - 60 -  Triglycerides <150 mg/dL - - - 46 -  Creatinine 0.50 - 1.10 mg/dL 0.73 0.72 0.70 0.61 0.71   BP/Weight 06/12/2018 02/06/2018 11/28/2017 11/03/2017 09/01/2017 06/13/2017 4/97/5300  Systolic BP 511 021 117 356 701 410 301  Diastolic BP 80 86 82 82 86 98 86  Wt. (Lbs) 316.12 319 328 327 337 335.25 328  BMI 49.51 48.5 49.87 49.72 51.24 52.51 51.37   No flowsheet data found.

## 2018-06-29 ENCOUNTER — Encounter: Payer: Self-pay | Admitting: Family Medicine

## 2018-07-21 ENCOUNTER — Other Ambulatory Visit: Payer: Self-pay | Admitting: Family Medicine

## 2018-07-23 ENCOUNTER — Other Ambulatory Visit: Payer: Self-pay | Admitting: Family Medicine

## 2018-07-23 DIAGNOSIS — F411 Generalized anxiety disorder: Secondary | ICD-10-CM

## 2018-07-23 DIAGNOSIS — F418 Other specified anxiety disorders: Secondary | ICD-10-CM

## 2018-07-27 ENCOUNTER — Other Ambulatory Visit: Payer: Self-pay | Admitting: Family Medicine

## 2018-07-31 ENCOUNTER — Telehealth: Payer: Self-pay

## 2018-07-31 NOTE — Telephone Encounter (Signed)
Writer left a voice mail message on 07-25-2018 and 07-31-2017.

## 2018-08-15 ENCOUNTER — Telehealth: Payer: Self-pay

## 2018-08-15 DIAGNOSIS — F411 Generalized anxiety disorder: Secondary | ICD-10-CM
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
## 2018-08-16 NOTE — Progress Notes (Signed)
Jennifer [name] is a 42 y.o. year old female with a history of depression, prediabetes, hypertension. Worsening depression in the context of loss of her sister in August, and her friend in September. 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 Per intake, patient reports drowsiness from psychotropics (details unknown).   Assessment Will recommend uptitration of antidepressant in the future if any worsening in her mood symptoms (recent worsening in her mood symptoms can be situational given her losses).   Recommendation - Consider uptitration of Effexor in the future if any worsening in her mood symptoms. Monitor hypertension. (Targeted dose 150-225 mg per daily) - Continue Buspar 7.5 mg TID - [place] specialist to provide supportive therapy for grief, coach self compassion

## 2018-08-21 ENCOUNTER — Encounter: Payer: Self-pay | Admitting: Family Medicine

## 2018-08-22 ENCOUNTER — Encounter: Payer: Self-pay | Admitting: Family Medicine

## 2018-08-24 ENCOUNTER — Telehealth: Payer: Self-pay

## 2018-08-24 NOTE — Telephone Encounter (Signed)
Patient called to check the status of the message she left w/ brandi this morning. She states she has been trying to do something about this since Monday w/ no success. She still has to work.  [phone]

## 2018-08-24 NOTE — Telephone Encounter (Signed)
Pt unable to come in because she has nobody to cover her at work. Hurting in her lower abdomen and feels like she has gas but if she tries to pass it her abdomen cramps up. No rectal bleeding but wants something to help with the cramping. Please advise

## 2018-08-25 ENCOUNTER — Other Ambulatory Visit: Payer: Self-pay | Admitting: Family Medicine

## 2018-08-25 ENCOUNTER — Encounter: Payer: Self-pay | Admitting: Family Medicine

## 2018-08-25 MED ORDER — DICYCLOMINE HCL 10 MG PO CAPS: ORAL_CAPSULE | ORAL | 0 refills | Status: DC

## 2018-08-25 NOTE — Telephone Encounter (Signed)
Bentyl is sent to [place] and I sent her a message this morning

## 2018-08-25 NOTE — Progress Notes (Signed)
Med for stomach cramps

## 2018-08-25 NOTE — Telephone Encounter (Signed)
completed

## 2018-08-29 ENCOUNTER — Telehealth: Payer: Self-pay

## 2018-08-29 NOTE — Telephone Encounter (Signed)
[facility] - Left Msg 

## 2018-09-05 ENCOUNTER — Encounter: Payer: Self-pay | Admitting: Family Medicine

## 2018-09-08 ENCOUNTER — Other Ambulatory Visit: Payer: Self-pay | Admitting: Family Medicine

## 2018-09-12 ENCOUNTER — Telehealth: Payer: Self-pay

## 2018-09-12 NOTE — Telephone Encounter (Signed)
2nd attempt - [facility]   

## 2018-09-27 ENCOUNTER — Telehealth: Payer: Self-pay

## 2018-09-27 NOTE — Telephone Encounter (Signed)
3rd attempt - [facility] - Left Message

## 2018-09-29 ENCOUNTER — Telehealth: Payer: Self-pay

## 2018-09-29 DIAGNOSIS — F411 Generalized anxiety disorder: Secondary | ICD-10-CM

## 2018-09-29 NOTE — BH Specialist Note (Signed)
[place] Telephone Follow-up  MRN: [id number] NAME: Jennifer [name] Date: 09/29/18   Total time: 30 minutes Call number: 2/6  Reason for call today:  [facility] Phone Follow UP   PHQ-9 Scores:  Depression screen [facility] 2/9 09/29/2018 09/29/2018 08/15/2018 06/12/2018 02/06/2018  Decreased Interest 2 2 3 1 3   Down, Depressed, Hopeless 2 2 3 1 3   PHQ - 2 Score 4 4 6 2 6   Altered sleeping 1 0 3 2 1   Tired, decreased energy 2 1 3 1  0  Change in appetite 0 1 1 2  0  Feeling bad or failure about yourself  2 2 3 1 2   Trouble concentrating 1 0 1 0 1  Moving slowly or fidgety/restless 0 0 0 0 1  Suicidal thoughts 0 - 0 0 1  PHQ-9 Score 10 8 17 8 12   Difficult doing work/chores Somewhat difficult - Very difficult Somewhat difficult Extremely dIfficult    GAD-7 Scores:  GAD 7 : Generalized Anxiety Score 09/29/2018 08/15/2018 02/06/2018  Nervous, Anxious, on Edge 2 2 2   Control/stop worrying 1 3 3   Worry too much - different things 2 2 3   Trouble relaxing 1 2 3   Restless 1 1 0  Easily annoyed or irritable 1 1 2   Afraid - awful might happen 1 3 3   Total GAD 7 Score 9 14 16   Anxiety Difficulty Somewhat difficult Somewhat difficult Not difficult at all    Stress Current stressors: Current Stressors: (None Reported) Sleep: Sleep: No problems Appetite: Appetite: No problems Coping ability: Coping ability: Normal  Patient taking medications as prescribed: Patient taking medications as prescribed: Yes  Current medications:  Outpatient Encounter Medications as of 09/29/2018  Medication Sig  . acetaminophen (TYLENOL) 500 MG tablet Take 500 mg by mouth every 6 (six) hours as needed for mild pain or moderate pain.  . busPIRone (BUSPAR) 7.5 MG tablet TAKE 1 TABLET BY MOUTH 3 TIMES A DAY  . busPIRone (BUSPAR) 7.5 MG tablet TAKE 1 TABLET (7.5 MG TOTAL) BY MOUTH 3 (THREE) TIMES DAILY.  Marland Kitchen CAMILA 0.35 MG tablet TAKE 1 TABLET BY MOUTH EVERY DAY  . Cholecalciferol (VITAMIN D3) 50000 units CAPS TAKE  1 CAPSULE (50,000 UNITS) BY MOUTH ONCE A WEEK.  . cyclobenzaprine (FLEXERIL) 10 MG tablet Take 1 tablet (10 mg total) by mouth at bedtime.  . dicyclomine (BENTYL) 10 MG capsule One capsule three times daily as needed, for excessive stomach cramping  . fluconazole (DIFLUCAN) 150 MG tablet One tablet once daily , as needed,  for vaginal yeast infection  associated with prolonged antibiotic use  . fluticasone (FLONASE) 50 MCG/ACT nasal spray Place 2 sprays into both nostrils daily.  . hydrochlorothiazide (HYDRODIURIL) 25 MG tablet TAKE 1 TABLET BY MOUTH EVERY DAY  . ibuprofen (ADVIL,MOTRIN) 600 MG tablet Take 600 mg by mouth 3 (three) times daily.  . metroNIDAZOLE (FLAGYL) 500 MG tablet Take 1 tablet (500 mg total) by mouth 2 (two) times daily.  . phentermine (ADIPEX-P) 37.5 MG tablet Take 1 tablet (37.5 mg total) by mouth daily before breakfast.  . potassium chloride SA (K-DUR,KLOR-CON) 20 MEQ tablet Take 1 tablet (20 mEq total) by mouth daily.  . Probiotic Product (PROBIOTIC DAILY PO) Take by mouth. 1 tab daily  . ranitidine (ZANTAC) 300 MG tablet Take 1 tablet (300 mg total) by mouth at bedtime.  . temazepam (RESTORIL) 15 MG capsule Take 1 capsule (15 mg total) by mouth at bedtime as needed for sleep.  Marland Kitchen tiZANidine (ZANAFLEX)  4 MG capsule Take 4 mg by mouth every 8 (eight) hours.  . valACYclovir (VALTREX) 500 MG tablet Take 1 tablet (500 mg total) by mouth 2 (two) times daily.  Marland Kitchen venlafaxine XR (EFFEXOR-XR) 75 MG 24 hr capsule TAKE ONE CAPSULE BY MOUTH EVERY DAY WITH BREAKFAST   No facility-administered encounter medications on file as of 10/01/2018.      Self-harm Behaviors Risk Assessment Self-harm risk factors: Self-harm risk factors: (Name Reported) Patient endorses recent thoughts of harming self: Have you recently had any thoughts about harming yourself?: No  [place] Suicide Severity Rating Scale: No flowsheet data found. C-SRSS 08/15/2018 2018-10-01  1. Wish to be Dead No No  2.  Suicidal Thoughts No No  6. Suicide Behavior Question No No     Danger to Others Risk Assessment Danger to others risk factors: Danger to Others Risk Factors: No risk factors noted Patient endorses recent thoughts of harming others: Notification required: No need or identified person    Substance Use Assessment Patient recently consumed alcohol:  No  Alcohol Use Disorder Identification Test (AUDIT):  Alcohol Use Disorder Test (AUDIT) 08/15/2018 2018-10-01  1. How often do you have a drink containing alcohol? 1 0  2. How many drinks containing alcohol do you have on a typical day when you are drinking? 0 0  3. How often do you have six or more drinks on one occasion? 0 0  AUDIT-C Score 1 0  Intervention/Follow-up AUDIT Score <7 follow-up not indicated AUDIT Score <7 follow-up not indicated   Patient recently used drugs:  No    Goals, Interventions and Follow-up Plan Goals: Increase healthy adjustment to current life circumstances Interventions: Behavioral Activation and Supportive Counseling Follow-up Plan: [facility] Phone Follow UP   Summary:   Follow UP - Patient is a 42 year old female.  Patient has a decrease in her PHQ score.  Patient reports that her medication is working well for her.  Patient reports that working gives her a lot of purpose in her life however, she wants to find another job.    Patient denies any stressors.  Patient reports that she has been busy caring for her 36 year old daughter.  Writer praised her for incorporating self care into her life.  Patient reports that she has been going to the movies as a means of self care.   Patient reports improved sleep.  Patient reports that engaging in self care activities helps to move forward in her grieving process.    Patient denies SI/HI/Psychosis/Substance Abuse. If your symptoms worsen or you have thoughts of suicide/homicide, PLEASE SEEK IMMEDIATE MEDICAL ATTENTION.  You may always call:  National Suicide Hotline:  669-858-6844;  [place] Crisis Line: (215)484-9308;  Crisis Recovery in [place]: 214-419-6405.  These are available 24 hours a day, 7 days a week.  During the next session - the patient will continue to engage in self care activities.     Graciella Freer LaVerne, LCAS-A

## 2018-10-04 ENCOUNTER — Telehealth: Payer: Self-pay

## 2018-10-04 NOTE — Telephone Encounter (Signed)
[facility] - Left Message  

## 2018-10-16 ENCOUNTER — Telehealth: Payer: Self-pay | Admitting: Licensed Clinical Social Worker

## 2018-10-16 DIAGNOSIS — F411 Generalized anxiety disorder: Secondary | ICD-10-CM

## 2018-10-16 NOTE — BH Specialist Note (Signed)
Left message

## 2018-10-17 ENCOUNTER — Ambulatory Visit: Payer: Self-pay | Admitting: Family Medicine

## 2018-10-19 ENCOUNTER — Ambulatory Visit (INDEPENDENT_AMBULATORY_CARE_PROVIDER_SITE_OTHER): Payer: BLUE CROSS/BLUE SHIELD | Admitting: Family Medicine

## 2018-10-19 ENCOUNTER — Other Ambulatory Visit: Payer: Self-pay

## 2018-10-19 ENCOUNTER — Encounter: Payer: Self-pay | Admitting: Family Medicine

## 2018-10-19 VITALS — BP 128/74 | HR 96 | Resp 12 | Ht 67.0 in | Wt 318.0 lb

## 2018-10-19 DIAGNOSIS — Z23 Encounter for immunization: Secondary | ICD-10-CM | POA: Diagnosis not present

## 2018-10-19 DIAGNOSIS — I10 Essential (primary) hypertension: Secondary | ICD-10-CM

## 2018-10-19 DIAGNOSIS — R7303 Prediabetes: Secondary | ICD-10-CM

## 2018-10-19 DIAGNOSIS — F411 Generalized anxiety disorder: Secondary | ICD-10-CM

## 2018-10-19 DIAGNOSIS — E559 Vitamin D deficiency, unspecified: Secondary | ICD-10-CM

## 2018-10-19 DIAGNOSIS — Z13220 Encounter for screening for lipoid disorders: Secondary | ICD-10-CM | POA: Diagnosis not present

## 2018-10-19 MED ORDER — VITAMIN D3 1.25 MG (50000 UT) PO CAPS: ORAL_CAPSULE | ORAL | 2 refills | Status: DC

## 2018-10-19 MED ORDER — PHENTERMINE HCL 37.5 MG PO TABS: 37.5000 mg | ORAL_TABLET | Freq: Every day | ORAL | 1 refills | Status: DC

## 2018-10-19 NOTE — Patient Instructions (Signed)
F/U in 4 months, call if you need me sooner  Aim for 4 pound per month weight loss, FOOD CHOICE and avoid sugar  Start half phentermine  Daily  I will send your lab results to you on my chart   Bariatric surgery is a very good option for weight loss , I encourage you to pursue this  Stomach virus likely caused diarrheah

## 2018-10-19 NOTE — Progress Notes (Signed)
Vitamin D reordered

## 2018-10-20 ENCOUNTER — Encounter: Payer: Self-pay | Admitting: Family Medicine

## 2018-10-20 LAB — HEMOGLOBIN A1C
HEMOGLOBIN A1C: 5.8 %{Hb} — AB (ref <5.7)
Mean Plasma Glucose: 120 (calc)
eAG (mmol/L): 6.6 (calc)

## 2018-10-20 LAB — LIPID PANEL
CHOL/HDL RATIO: 2.4 (calc) (ref <5.0)
Cholesterol: 134 mg/dL (ref <200)
HDL: 56 mg/dL (ref >50)
LDL Cholesterol (Calc): 62 mg/dL (calc)
NON-HDL CHOLESTEROL (CALC): 78 mg/dL (ref <130)
TRIGLYCERIDES: 81 mg/dL (ref <150)

## 2018-10-22 ENCOUNTER — Encounter: Payer: Self-pay | Admitting: Family Medicine

## 2018-10-22 NOTE — Assessment & Plan Note (Signed)
Deteriorated. Patient re-educated about  the importance of commitment to a  minimum of 150 minutes of exercise per week.  The importance of healthy food choices with portion control discussed. Encouraged to start a food diary, count calories and to consider  joining a support group. Sample diet sheets offered. Goals set by the patient for the next several months.   Weight /BMI 10/19/2018 06/12/2018 02/06/2018  WEIGHT 318 lb 316 lb 1.9 oz 319 lb  HEIGHT 5\' 7"  5\' 7"  5\' 8"   BMI 49.81 kg/m2 49.51 kg/m2 48.5 kg/m2

## 2018-10-22 NOTE — Assessment & Plan Note (Signed)
Controlled, no change in medication DASH diet and commitment to daily physical activity for a minimum of 30 minutes discussed and encouraged, as a part of hypertension management. The importance of attaining a healthy weight is also discussed.  BP/Weight 10/19/2018 06/12/2018 02/06/2018 11/28/2017 11/03/2017 09/01/2017 1/61/0960  Systolic BP 454 098 119 147 829 562 130  Diastolic BP 74 80 86 82 82 86 98  Wt. (Lbs) 318 316.12 319 328 327 337 335.25  BMI 49.81 49.51 48.5 49.87 49.72 51.24 52.51

## 2018-10-22 NOTE — Assessment & Plan Note (Signed)
Reports increased and uncontrolled symptoms mainly due to holiday season

## 2018-10-22 NOTE — Progress Notes (Signed)
Jennifer [name]     MRN: [id number]      DOB: May 29, 1976   HPI Jennifer [name] is here for follow up and re-evaluation of chronic medical conditions, medication management and review of any available recent lab and radiology data.  Preventive health is updated, specifically  Cancer screening and Immunization.   Questions or concerns regarding consultations or procedures which the PT has had in the interim are  addressed. The PT denies any adverse reactions to current medications since the last visit.  Now re considering bariatric surgery as her best option for weight loss as her progress is not what she hopes for and needs Recent episode of diarrheah and stomach cramps x 4 days which is resolved ROS Denies recent fever or chills. Denies sinus pressure, nasal congestion, ear pain or sore throat. Denies chest congestion, productive cough or wheezing. Denies chest pains, palpitations and leg swelling Denies abdominal pain, nausea, vomiting, or constipation.   Denies dysuria, frequency, hesitancy or incontinence. Denies joint pain, swelling and limitation in mobility. Denies headaches, seizures, numbness, or tingling. Denies uncontrolled  Depression,c/o increased  anxiety denies  insomnia. Denies skin break down or rash.   PE  BP 128/74 (BP Location: Right Arm, Patient Position: Sitting, Cuff Size: Large)   Pulse 96   Resp 12   Ht 5\' 7"  (1.702 m)   Wt (!) 318 lb (144.2 kg)   SpO2 97% Comment: room air  BMI 49.81 kg/m   Patient alert and oriented and in no cardiopulmonary distress.  HEENT: No facial asymmetry, EOMI,   oropharynx pink and moist.  Neck supple no JVD, no mass.  Chest: Clear to auscultation bilaterally.  CVS: S1, S2 no murmurs, no S3.Regular rate.  ABD: Soft non tender.   Ext: No edema  MS: Adequate ROM spine, shoulders, hips and knees.  Skin: Intact, no ulcerations or rash noted.  Psych: Good eye contact, normal affect. Memory intact mildly  anxious not   depressed appearing.  CNS: CN 2-12 intact, power,  normal throughout.no focal deficits noted.   Assessment & Plan  Essential hypertension Controlled, no change in medication DASH diet and commitment to daily physical activity for a minimum of 30 minutes discussed and encouraged, as a part of hypertension management. The importance of attaining a healthy weight is also discussed.  BP/Weight 10/19/2018 06/12/2018 02/06/2018 11/28/2017 11/03/2017 09/01/2017 06/13/2017  Systolic BP 128 120 120 114 112 122 130  Diastolic BP 74 80 86 82 82 86 98  Wt. (Lbs) 318 316.12 319 328 327 337 335.25  BMI 49.81 49.51 48.5 49.87 49.72 51.24 52.51       Morbid obesity (HCC) Deteriorated. Patient re-educated about  the importance of commitment to a  minimum of 150 minutes of exercise per week.  The importance of healthy food choices with portion control discussed. Encouraged to start a food diary, count calories and to consider  joining a support group. Sample diet sheets offered. Goals set by the patient for the next several months.   Weight /BMI 10/19/2018 06/12/2018 02/06/2018  WEIGHT 318 lb 316 lb 1.9 oz 319 lb  HEIGHT 5\' 7"  5\' 7"  5\' 8"   BMI 49.81 kg/m2 49.51 kg/m2 48.5 kg/m2      Prediabetes Patient educated about the importance of limiting  Carbohydrate intake , the need to commit to daily physical activity for a minimum of 30 minutes , and to commit weight loss. The fact that changes in all these areas will reduce or eliminate all together the  development of diabetes is stressed.  Improved  Diabetic Labs Latest Ref Rng & Units 10/19/2018 06/12/2018 02/06/2018 09/01/2017 12/08/2016  HbA1c <5.7 % of total Hgb 5.8(H) - 6.1(H) 6.1(H) 5.7(H)  Chol <200 mg/dL 308 - - - 657  HDL >84 mg/dL 56 - - - 59  Calc LDL mg/dL (calc) 62 - - - 60  Triglycerides <150 mg/dL 81 - - - 46  Creatinine 0.50 - 1.10 mg/dL - 6.96 2.95 2.84 1.32   BP/Weight 10/19/2018 06/12/2018 02/06/2018 11/28/2017 11/03/2017 09/01/2017  06/13/2017  Systolic BP 128 120 120 114 112 122 130  Diastolic BP 74 80 86 82 82 86 98  Wt. (Lbs) 318 316.12 319 328 327 337 335.25  BMI 49.81 49.51 48.5 49.87 49.72 51.24 52.51   No flowsheet data found.    GAD (generalized anxiety disorder) Reports increased and uncontrolled symptoms mainly due to holiday season

## 2018-10-22 NOTE — Assessment & Plan Note (Signed)
Patient educated about the importance of limiting  Carbohydrate intake , the need to commit to daily physical activity for a minimum of 30 minutes , and to commit weight loss. The fact that changes in all these areas will reduce or eliminate all together the development of diabetes is stressed.  Improved  Diabetic Labs Latest Ref Rng & Units 10/19/2018 06/12/2018 02/06/2018 09/01/2017 12/08/2016  HbA1c <5.7 % of total Hgb 5.8(H) - 6.1(H) 6.1(H) 5.7(H)  Chol <200 mg/dL 134 - - - 128  HDL >50 mg/dL 56 - - - 59  Calc LDL mg/dL (calc) 62 - - - 60  Triglycerides <150 mg/dL 81 - - - 46  Creatinine 0.50 - 1.10 mg/dL - 0.73 0.72 0.70 0.61   BP/Weight 10/19/2018 06/12/2018 02/06/2018 11/28/2017 11/03/2017 09/01/2017 04/16/931  Systolic BP 355 732 202 542 706 237 628  Diastolic BP 74 80 86 82 82 86 98  Wt. (Lbs) 318 316.12 319 328 327 337 335.25  BMI 49.81 49.51 48.5 49.87 49.72 51.24 52.51   No flowsheet data found.

## 2018-10-25 ENCOUNTER — Encounter: Payer: Self-pay | Admitting: Family Medicine

## 2018-10-27 ENCOUNTER — Other Ambulatory Visit: Payer: Self-pay | Admitting: Family Medicine

## 2018-10-27 MED ORDER — HYDROCORTISONE ACE-PRAMOXINE 1-1 % RE FOAM: 1.0000 | Freq: Two times a day (BID) | RECTAL | 1 refills | Status: DC

## 2018-10-30 ENCOUNTER — Telehealth: Payer: Self-pay

## 2018-10-30 NOTE — Telephone Encounter (Signed)
2nd attempt - [facility]   

## 2018-10-31 ENCOUNTER — Telehealth: Payer: Self-pay | Admitting: Family Medicine

## 2018-10-31 ENCOUNTER — Telehealth: Payer: Self-pay | Admitting: *Deleted

## 2018-10-31 NOTE — Telephone Encounter (Signed)
Tried to speak with someone at the pharmacy. Was on hold for 25 minutes. Will try again later.

## 2018-10-31 NOTE — Telephone Encounter (Signed)
[facility] is calling stating that Proctofoam is on back order and they cant get it in, and they are requesting something else to be called in

## 2018-10-31 NOTE — Telephone Encounter (Signed)
Error

## 2018-10-31 NOTE — Telephone Encounter (Signed)
pls let me know what they have available that is covered by pt's ins, I will send in, thanks

## 2018-11-01 ENCOUNTER — Encounter: Payer: Self-pay | Admitting: Family Medicine

## 2018-11-01 ENCOUNTER — Other Ambulatory Visit: Payer: Self-pay

## 2018-11-01 MED ORDER — HYDROCORTISONE 2.5 % RE CREA: 1.0000 "application " | TOPICAL_CREAM | Freq: Two times a day (BID) | RECTAL | 0 refills | Status: DC

## 2018-11-01 NOTE — Telephone Encounter (Signed)
protozozone-hc cream would be covered by her insurance.

## 2018-12-11 ENCOUNTER — Telehealth: Payer: Self-pay

## 2018-12-11 DIAGNOSIS — F418 Other specified anxiety disorders: Secondary | ICD-10-CM

## 2018-12-11 NOTE — BH Specialist Note (Signed)
[place] Telephone Follow-up  MRN: [id number] NAME: Jennifer [name] Date: 12/11/18  Total time: 30 minutes Call number: 4/6  Reason for call today: Reason for Contact: PHQ9-4 weeks  PHQ-9 Scores:  Depression screen [facility] 2/9 12/11/2018 10/19/2018 09/29/2018 09/29/2018 08/15/2018  Decreased Interest 2 2 2 2 3   Down, Depressed, Hopeless 2 1 2 2 3   PHQ - 2 Score 4 3 4 4 6   Altered sleeping 1 3 1  0 3  Tired, decreased energy 1 3 2 1 3   Change in appetite 1 1 0 1 1  Feeling bad or failure about yourself  3 1 2 2 3   Trouble concentrating 1 0 1 0 1  Moving slowly or fidgety/restless 0 0 0 0 0  Suicidal thoughts 0 0 0 - 0  PHQ-9 Score 11 11 10 8 17   Difficult doing work/chores Somewhat difficult Not difficult at all Somewhat difficult - Very difficult  Some recent data might be hidden     GAD-7 Scores:  GAD 7 : Generalized Anxiety Score 12/11/2018 09/29/2018 08/15/2018 02/06/2018  Nervous, Anxious, on Edge 2 2 2 2   Control/stop worrying 2 1 3 3   Worry too much - different things 2 2 2 3   Trouble relaxing 1 1 2 3   Restless 1 1 1  0  Easily annoyed or irritable 0 1 1 2   Afraid - awful might happen 1 1 3 3   Total GAD 7 Score 9 9 14 16   Anxiety Difficulty Somewhat difficult Somewhat difficult Somewhat difficult Not difficult at all    Stress Current stressors: Current Stressors: (Wants to obtain a new job; Family support has moved out of state. ) Sleep: Sleep: Decreased, Difficulty falling asleep Appetite: Appetite: Decreased, Loss of appetite Coping ability: Coping ability: Deficient support system, Exhausted, Overwhelmed  Patient taking medications as prescribed: Patient taking medications as prescribed: Yes  Current medications:  Outpatient Encounter Medications as of 12/11/2018  Medication Sig  . acetaminophen (TYLENOL) 500 MG tablet Take 500 mg by mouth every 6 (six) hours as needed for mild pain or moderate pain.  . busPIRone (BUSPAR) 7.5 MG tablet TAKE 1 TABLET BY  MOUTH 3 TIMES A DAY  . CAMILA 0.35 MG tablet TAKE 1 TABLET BY MOUTH EVERY DAY  . Cholecalciferol (VITAMIN D3) 1.25 MG (50000 UT) CAPS TAKE 1 CAPSULE (50,000 UNITS) BY MOUTH ONCE A WEEK.  . cyclobenzaprine (FLEXERIL) 10 MG tablet Take 1 tablet (10 mg total) by mouth at bedtime.  . dicyclomine (BENTYL) 10 MG capsule One capsule three times daily as needed, for excessive stomach cramping  . fluconazole (DIFLUCAN) 150 MG tablet One tablet once daily , as needed,  for vaginal yeast infection  associated with prolonged antibiotic use  . fluticasone (FLONASE) 50 MCG/ACT nasal spray Place 2 sprays into both nostrils daily.  . hydrochlorothiazide (HYDRODIURIL) 25 MG tablet TAKE 1 TABLET BY MOUTH EVERY DAY  . hydrocortisone (PROCTOZONE-HC) 2.5 % rectal cream Place 1 application rectally 2 (two) times daily. D/c proctofoam  . hydrocortisone-pramoxine (PROCTOFOAM-HC) rectal foam Place 1 applicator rectally 2 (two) times daily.  Marland Kitchen ibuprofen (ADVIL,MOTRIN) 600 MG tablet Take 600 mg by mouth 3 (three) times daily.  . phentermine (ADIPEX-P) 37.5 MG tablet Take 1 tablet (37.5 mg total) by mouth daily before breakfast.  . potassium chloride SA (K-DUR,KLOR-CON) 20 MEQ tablet Take 1 tablet (20 mEq total) by mouth daily.  . Probiotic Product (PROBIOTIC DAILY PO) Take by mouth. 1 tab daily  . ranitidine (ZANTAC) 300 MG tablet  Take 1 tablet (300 mg total) by mouth at bedtime.  . temazepam (RESTORIL) 15 MG capsule Take 1 capsule (15 mg total) by mouth at bedtime as needed for sleep.  Marland Kitchen tiZANidine (ZANAFLEX) 4 MG capsule Take 4 mg by mouth every 8 (eight) hours.  . valACYclovir (VALTREX) 500 MG tablet Take 1 tablet (500 mg total) by mouth 2 (two) times daily.  Marland Kitchen venlafaxine XR (EFFEXOR-XR) 75 MG 24 hr capsule TAKE ONE CAPSULE BY MOUTH EVERY DAY WITH BREAKFAST   No facility-administered encounter medications on file as of 2018/12/28.      Self-harm Behaviors Risk Assessment Self-harm risk factors: Self-harm risk  factors: (None Reported) Patient endorses recent thoughts of harming self: Have you recently had any thoughts about harming yourself?: No  [place] Suicide Severity Rating Scale: No flowsheet data found. C-SRSS 08/15/2018 09/29/2018 Dec 28, 2018  1. Wish to be Dead No No No  2. Suicidal Thoughts No No No  6. Suicide Behavior Question No No No     Danger to Others Risk Assessment Danger to others risk factors: Danger to Others Risk Factors: No risk factors noted Patient endorses recent thoughts of harming others: Notification required: No need or identified person    Substance Use Assessment Patient recently consumed alcohol:  None Reported  Alcohol Use Disorder Identification Test (AUDIT):  Alcohol Use Disorder Test (AUDIT) 08/15/2018 09/29/2018 December 28, 2018  1. How often do you have a drink containing alcohol? 1 0 0  2. How many drinks containing alcohol do you have on a typical day when you are drinking? 0 0 0  3. How often do you have six or more drinks on one occasion? 0 0 0  AUDIT-C Score 1 0 0  Alcohol Brief Interventions/Follow-up AUDIT Score <7 follow-up not indicated AUDIT Score <7 follow-up not indicated AUDIT Score <7 follow-up not indicated   Patient recently used drugs:  None Reported     Goals, Interventions and Follow-up Plan Goals: Increase healthy adjustment to current life circumstances; Wants to obtain another job and decrease feelings of depression for feeling burt out at her present jobl Increase family/friend social supports   Interventions: Behavioral Activation and Supportive Counseling Follow-up Plan: [facility] Phone Follow UP   Summary:   Patient is a 43 year old female.  Patient PHQ and GAD score remains the same.   Stressor:  Patient reports depression associated with not being able to obtain a better job.  Patient's nephew and his wife were her main social support.  Her nephew and his wife have moved to [place] last month.     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  Patient   Behavioral Interventions: Supportive Counseling:  Patient actively listened as the patient reports barriers that have been keeping her from applying to other job leads.  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  Patient reports that she will be willing to go to the gym as a for of self care  Writer discussed the pros and cons for the patient obtaining a new job.  Writer discuss other family members and friends that she is able to have as social supports since her nephew and his wife have moved to [place].  Writer praised patient for focusing on her daughter's needs.  Patient reports that she knows that she cannot be so depressed that she is not able to  adequately care for her daughter.   Medication: Patient reports that her psychiatric medication is working for her.  Patient denies any side effects from her medication.     During the next session:  Patent will discuss the discuss job leads that she was able to locate on her day off (Saturday).  Writer will provide patient with referral information for job placement.  Writer will follow up on the patient going to the gym next week.   Graciella Freer [place], LCAS-A

## 2018-12-14 ENCOUNTER — Other Ambulatory Visit: Payer: Self-pay | Admitting: Family Medicine

## 2018-12-20 NOTE — Progress Notes (Signed)
Discussed the case. No change in recommendation.

## 2018-12-24 ENCOUNTER — Other Ambulatory Visit: Payer: Self-pay | Admitting: Family Medicine

## 2018-12-26 ENCOUNTER — Telehealth: Payer: Self-pay

## 2018-12-26 NOTE — Telephone Encounter (Signed)
[facility] - Left Message  

## 2019-01-09 ENCOUNTER — Other Ambulatory Visit: Payer: Self-pay | Admitting: Family Medicine

## 2019-01-10 ENCOUNTER — Other Ambulatory Visit: Payer: Self-pay

## 2019-01-10 MED ORDER — BUSPIRONE HCL 7.5 MG PO TABS: ORAL_TABLET | ORAL | 1 refills | Status: DC

## 2019-01-11 ENCOUNTER — Telehealth: Payer: Self-pay

## 2019-01-11 NOTE — Telephone Encounter (Signed)
3rd attempt - [facility]  

## 2019-01-15 ENCOUNTER — Other Ambulatory Visit: Payer: Self-pay | Admitting: Family Medicine

## 2019-01-15 ENCOUNTER — Encounter: Payer: Self-pay | Admitting: Family Medicine

## 2019-01-16 ENCOUNTER — Telehealth: Payer: Self-pay

## 2019-01-16 NOTE — Telephone Encounter (Signed)
Called  Patient and scheduled her an appointment for Thursday 01-18-19

## 2019-01-18 ENCOUNTER — Ambulatory Visit: Payer: Self-pay | Admitting: Family Medicine

## 2019-01-22 ENCOUNTER — Other Ambulatory Visit (HOSPITAL_COMMUNITY)
Admission: RE | Admit: 2019-01-22 | Discharge: 2019-01-22 | Disposition: A | Discharge status: Home / Self Care | Payer: BLUE CROSS/BLUE SHIELD | Source: Ambulatory Visit | Attending: Family Medicine | Admitting: Family Medicine

## 2019-01-22 ENCOUNTER — Telehealth: Payer: Self-pay

## 2019-01-22 ENCOUNTER — Ambulatory Visit (INDEPENDENT_AMBULATORY_CARE_PROVIDER_SITE_OTHER): Payer: BLUE CROSS/BLUE SHIELD | Admitting: Family Medicine

## 2019-01-22 ENCOUNTER — Encounter: Payer: Self-pay | Admitting: Family Medicine

## 2019-01-22 VITALS — BP 122/80 | HR 106 | Temp 98.6°F | Resp 16 | Ht 67.0 in | Wt 323.0 lb

## 2019-01-22 DIAGNOSIS — I10 Essential (primary) hypertension: Secondary | ICD-10-CM

## 2019-01-22 DIAGNOSIS — B372 Candidiasis of skin and nail: Secondary | ICD-10-CM | POA: Insufficient documentation

## 2019-01-22 DIAGNOSIS — N760 Acute vaginitis: Secondary | ICD-10-CM

## 2019-01-22 DIAGNOSIS — L720 Epidermal cyst: Secondary | ICD-10-CM

## 2019-01-22 DIAGNOSIS — F418 Other specified anxiety disorders: Secondary | ICD-10-CM

## 2019-01-22 DIAGNOSIS — R7303 Prediabetes: Secondary | ICD-10-CM

## 2019-01-22 DIAGNOSIS — L089 Local infection of the skin and subcutaneous tissue, unspecified: Secondary | ICD-10-CM

## 2019-01-22 DIAGNOSIS — B369 Superficial mycosis, unspecified: Secondary | ICD-10-CM

## 2019-01-22 MED ORDER — CLOTRIMAZOLE-BETAMETHASONE 1-0.05 % EX CREA: 1.0000 "application " | TOPICAL_CREAM | Freq: Two times a day (BID) | CUTANEOUS | 1 refills | Status: DC

## 2019-01-22 MED ORDER — PHENTERMINE HCL 37.5 MG PO TABS: 37.5000 mg | ORAL_TABLET | Freq: Every day | ORAL | 1 refills | Status: DC

## 2019-01-22 MED ORDER — NYSTATIN 100000 UNIT/GM EX POWD: Freq: Three times a day (TID) | CUTANEOUS | 1 refills | Status: DC

## 2019-01-22 MED ORDER — FLUCONAZOLE 150 MG PO TABS: ORAL_TABLET | ORAL | 1 refills | Status: DC

## 2019-01-22 MED ORDER — DOXYCYCLINE HYCLATE 100 MG PO TABS: 100.0000 mg | ORAL_TABLET | Freq: Two times a day (BID) | ORAL | 0 refills | Status: DC

## 2019-01-22 NOTE — Assessment & Plan Note (Signed)
Symptomatic with malodorous d/c , will await results before prescribing

## 2019-01-22 NOTE — Assessment & Plan Note (Addendum)
Inadequately treated , also being treated by psychiatry,score is 12, not suicidal or homicidal

## 2019-01-22 NOTE — Progress Notes (Signed)
Jennifer [name]     MRN: [id number]      DOB: 08-17-1976   HPI Ms. Jennifer [name] is here for follow up and re-evaluation of chronic medical conditions, medication management and review of any available recent lab and radiology data.  Preventive health is updated, specifically  Cancer screening and Immunization.   . The PT denies any adverse reactions to current medications since the last visit.  5 day h/o painful cyst on left buttock, recurrent for years, states spontaneously burst with purulent drainage, now ready for excision. C/o malaise and  chills this past weekend. Has had some weight los success with phentermine, wants to continue, needs to work on  food choice C/o foul smelling vaginal discharge x 1 week , wants to be tested for STXD ROS Denies recent fever or chills. Denies sinus pressure, nasal congestion, ear pain or sore throat. Denies chest congestion, productive cough or wheezing. Denies chest pains, palpitations and leg swelling Denies abdominal pain, nausea, vomiting,diarrhea or constipation.   Denies dysuria, frequency, hesitancy or incontinence. Denies joint pain, swelling and limitation in mobility. Denies headaches, seizures, numbness, or tingling. Denies depression, anxiety or insomnia.   PE  BP 122/80   Pulse (!) 106   Temp 98.6 F (37 C) (Oral)   Resp 16   Ht 5\' 7"  (1.702 m)   Wt (!) 323 lb (146.5 kg)   SpO2 96%   BMI 50.59 kg/m   Patient alert and oriented and in no cardiopulmonary distress.  HEENT: No facial asymmetry, EOMI,   oropharynx pink and moist.  Neck supple no JVD, no mass.  Chest: Clear to auscultation bilaterally.  CVS: S1, S2 no murmurs, no S3.Regular rate.  ABD: Soft non tender.   Ext: No edema  MS: Adequate ROM spine, shoulders, hips and knees.  Skin: draining and partially healed infected cyst of left buttocl near perineum Fungal and yeast rash with irrittion of skin under both armpits  GYNE: thick white discharge slightly  malodorous, o cervical motion or adnexal tenderness  Psych: Good eye contact, normal affect. Memory intact not anxious or depressed appearing.  CNS: CN 2-12 intact, power,  normal throughout.no focal deficits noted.   Assessment & Plan  Vulvovaginitis Symptomatic with malodorous d/c , will await results before prescribing  Morbid obesity (HCC) Deteriorated.  Patient re-educated about  the importance of commitment to a  minimum of 150 minutes of exercise per week as able.  The importance of healthy food choices with portion control discussed, as well as eating regularly and within a 12 hour window most days. The need to choose "clean , green" food 50 to 75% of the time is discussed, as well as to make water the primary drink and set a goal of 64 ounces water daily.  Encouraged to start a food diary,  and to consider  joining a support group. Sample diet sheets offered. Goals set by the patient for the next several months.   Weight /BMI 01/22/2019 10/19/2018 06/12/2018  WEIGHT 323 lb 318 lb 316 lb 1.9 oz  HEIGHT 5\' 7"  5\' 7"  5\' 7"   BMI 50.59 kg/m2 49.81 kg/m2 49.51 kg/m2      Essential hypertension Controlled, no change in medication DASH diet and commitment to daily physical activity for a minimum of 30 minutes discussed and encouraged, as a part of hypertension management. The importance of attaining a healthy weight is also discussed.  BP/Weight 01/22/2019 10/19/2018 06/12/2018 02/06/2018 11/28/2017 11/03/2017 09/01/2017  Systolic BP 122 128 120 120  114 112 122  Diastolic BP 80 74 80 86 82 82 86  Wt. (Lbs) 323 318 316.12 319 328 327 337  BMI 50.59 49.81 49.51 48.5 49.87 49.72 51.24       Dermatomycosis Current active infection inboth axillae, clotrimaz/betameth prescribed  Candidiasis of skin Topical monistat prescribed  Prediabetes Patient educated about the importance of limiting  Carbohydrate intake , the need to commit to daily physical activity for a minimum of 30  minutes , and to commit weight loss. The fact that changes in all these areas will reduce or eliminate all together the development of diabetes is stressed.   Diabetic Labs Latest Ref Rng & Units 10/19/2018 06/12/2018 02/06/2018 09/01/2017 12/08/2016  HbA1c <5.7 % of total Hgb 5.8(H) - 6.1(H) 6.1(H) 5.7(H)  Chol <200 mg/dL 401 - - - 027  HDL >25 mg/dL 56 - - - 59  Calc LDL mg/dL (calc) 62 - - - 60  Triglycerides <150 mg/dL 81 - - - 46  Creatinine 0.50 - 1.10 mg/dL - 3.66 4.40 3.47 4.25   BP/Weight 01/22/2019 10/19/2018 06/12/2018 02/06/2018 11/28/2017 11/03/2017 09/01/2017  Systolic BP 122 128 120 120 114 112 122  Diastolic BP 80 74 80 86 82 82 86  Wt. (Lbs) 323 318 316.12 319 328 327 337  BMI 50.59 49.81 49.51 48.5 49.87 49.72 51.24   No flowsheet data found.  Updated lab needed at/ before next visit.   Depression with anxiety Inadequately treated , also being treated by psychiatry,score is 11, not suicidal or homicidal

## 2019-01-22 NOTE — Assessment & Plan Note (Signed)
Controlled, no change in medication DASH diet and commitment to daily physical activity for a minimum of 30 minutes discussed and encouraged, as a part of hypertension management. The importance of attaining a healthy weight is also discussed.  BP/Weight 01/22/2019 10/19/2018 06/12/2018 02/06/2018 11/28/2017 11/03/2017 88/67/7373  Systolic BP 668 159 470 761 518 343 735  Diastolic BP 80 74 80 86 82 82 86  Wt. (Lbs) 323 318 316.12 319 328 327 337  BMI 50.59 49.81 49.51 48.5 49.87 49.72 51.24

## 2019-01-22 NOTE — Assessment & Plan Note (Signed)
Deteriorated.  Patient re-educated about  the importance of commitment to a  minimum of 150 minutes of exercise per week as able.  The importance of healthy food choices with portion control discussed, as well as eating regularly and within a 12 hour window most days. The need to choose "clean , green" food 50 to 75% of the time is discussed, as well as to make water the primary drink and set a goal of 64 ounces water daily.  Encouraged to start a food diary,  and to consider  joining a support group. Sample diet sheets offered. Goals set by the patient for the next several months.   Weight /BMI 01/22/2019 10/19/2018 06/12/2018  WEIGHT 323 lb 318 lb 316 lb 1.9 oz  HEIGHT 5\' 7"  5\' 7"  5\' 7"   BMI 50.59 kg/m2 49.81 kg/m2 49.51 kg/m2

## 2019-01-22 NOTE — Assessment & Plan Note (Signed)
Topical monistat prescribed

## 2019-01-22 NOTE — Telephone Encounter (Signed)
[facility] Inactive - Several attempts have been made to contact patient without success. Patient will be placed on the inactive list.  If services are needed again.  Please contact [facility] at [phone].    Information will be routed to the PCP and Dr. Hisada  

## 2019-01-22 NOTE — Assessment & Plan Note (Signed)
Patient educated about the importance of limiting  Carbohydrate intake , the need to commit to daily physical activity for a minimum of 30 minutes , and to commit weight loss. The fact that changes in all these areas will reduce or eliminate all together the development of diabetes is stressed.   Diabetic Labs Latest Ref Rng & Units 10/19/2018 06/12/2018 02/06/2018 09/01/2017 12/08/2016  HbA1c <5.7 % of total Hgb 5.8(H) - 6.1(H) 6.1(H) 5.7(H)  Chol <200 mg/dL 134 - - - 128  HDL >50 mg/dL 56 - - - 59  Calc LDL mg/dL (calc) 62 - - - 60  Triglycerides <150 mg/dL 81 - - - 46  Creatinine 0.50 - 1.10 mg/dL - 0.73 0.72 0.70 0.61   BP/Weight 01/22/2019 10/19/2018 06/12/2018 02/06/2018 11/28/2017 11/03/2017 40/81/4481  Systolic BP 856 314 970 263 785 885 027  Diastolic BP 80 74 80 86 82 82 86  Wt. (Lbs) 323 318 316.12 319 328 327 337  BMI 50.59 49.81 49.51 48.5 49.87 49.72 51.24   No flowsheet data found.  Updated lab needed at/ before next visit.

## 2019-01-22 NOTE — Patient Instructions (Addendum)
F/U in 4 months, call if you need me before  Antibiotic is being prescribed for the recurrent infected cyst and we have sent specimens for testing for STD  You are being referred to gen surgeon per recurrent cyst as discussed  Resume half phentermine daily  Non fasting cBC, hBA1C and chem 7 and EGFr 1 week before follow up  Stop deodorant, use cornstarch, I am prescribing topical medications for fungal and yeast rash under both arms  It is important that you exercise regularly at least 30 minutes 5 times a week. If you develop chest pain, have severe difficulty breathing, or feel very tired, stop exercising immediately and seek medical attention

## 2019-01-22 NOTE — Assessment & Plan Note (Signed)
Current active infection inboth axillae, clotrimaz/betameth prescribed

## 2019-01-24 ENCOUNTER — Encounter: Payer: Self-pay | Admitting: Family Medicine

## 2019-01-24 ENCOUNTER — Other Ambulatory Visit: Payer: Self-pay | Admitting: Family Medicine

## 2019-01-24 LAB — CERVICOVAGINAL ANCILLARY ONLY
Bacterial vaginitis: POSITIVE — AB
CHLAMYDIA, DNA PROBE: NEGATIVE
Candida vaginitis: NEGATIVE
Neisseria Gonorrhea: NEGATIVE
Trichomonas: NEGATIVE

## 2019-01-24 MED ORDER — METRONIDAZOLE 500 MG PO TABS: 500.0000 mg | ORAL_TABLET | Freq: Two times a day (BID) | ORAL | 0 refills | Status: DC

## 2019-01-24 NOTE — Progress Notes (Signed)
flagyll

## 2019-03-06 ENCOUNTER — Encounter: Payer: Self-pay | Admitting: Family Medicine

## 2019-03-07 ENCOUNTER — Encounter: Payer: Self-pay | Admitting: Family Medicine

## 2019-04-27 ENCOUNTER — Other Ambulatory Visit: Payer: Self-pay | Admitting: Family Medicine

## 2019-05-08 ENCOUNTER — Telehealth (INDEPENDENT_AMBULATORY_CARE_PROVIDER_SITE_OTHER): Payer: BC Managed Care – PPO | Admitting: Family Medicine

## 2019-05-08 ENCOUNTER — Encounter: Payer: Self-pay | Admitting: Family Medicine

## 2019-05-08 ENCOUNTER — Other Ambulatory Visit: Payer: Self-pay

## 2019-05-08 VITALS — BP 120/82 | Ht 67.0 in | Wt 320.0 lb

## 2019-05-08 DIAGNOSIS — M5136 Other intervertebral disc degeneration, lumbar region: Secondary | ICD-10-CM | POA: Diagnosis not present

## 2019-05-08 DIAGNOSIS — Z13220 Encounter for screening for lipoid disorders: Secondary | ICD-10-CM

## 2019-05-08 DIAGNOSIS — I10 Essential (primary) hypertension: Secondary | ICD-10-CM | POA: Diagnosis not present

## 2019-05-08 DIAGNOSIS — Z1231 Encounter for screening mammogram for malignant neoplasm of breast: Secondary | ICD-10-CM

## 2019-05-08 DIAGNOSIS — R7303 Prediabetes: Secondary | ICD-10-CM

## 2019-05-08 DIAGNOSIS — E559 Vitamin D deficiency, unspecified: Secondary | ICD-10-CM

## 2019-05-08 MED ORDER — PANTOPRAZOLE SODIUM 20 MG PO TBEC: 20.0000 mg | DELAYED_RELEASE_TABLET | Freq: Every day | ORAL | 0 refills | Status: DC

## 2019-05-08 MED ORDER — PREDNISONE 5 MG (21) PO TBPK: 5.0000 mg | ORAL_TABLET | ORAL | 0 refills | Status: DC

## 2019-05-08 MED ORDER — IBUPROFEN 800 MG PO TABS: 800.0000 mg | ORAL_TABLET | Freq: Three times a day (TID) | ORAL | 0 refills | Status: AC | PRN

## 2019-05-08 NOTE — Progress Notes (Signed)
Virtual Visit via Telephone Note  I connected with Jennifer [name] on 05/08/19 at  2:40 PM EDT by telephone and verified that I am speaking with the correct person using two identifiers.  Location: Patient: work Provider: office   I discussed the limitations, risks, security and privacy concerns of performing an evaluation and management service by telephone and the availability of in person appointments. I also discussed with the patient that there may be a patient responsible charge related to this service. The patient expressed understanding and agreed to proceed.   History of Present Illness: F/U chronic problems  Low back radiating down right leg rated at an 8 to kneee posterior, standing worsens pain, going on for approx 3 months, , rated at an 8 for 3 months   Observations/Objective:   Assessment and Plan:   Follow Up Instructions:    I discussed the assessment and treatment plan with the patient. The patient was provided an opportunity to ask questions and all were answered. The patient agreed with the plan and demonstrated an understanding of the instructions.   The patient was advised to call back or seek an in-person evaluation if the symptoms worsen or if the condition fails to improve as anticipated.  I provided 25 minutes of non-face-to-face time during this encounter.   Tula Nakayama, MD

## 2019-05-08 NOTE — Patient Instructions (Addendum)
F/u in office for review of weight and updated labs, also depression and anxiety  Screen  You will have mammogram scheduled latest appt time as requested  You are treated for back pain due to arthritis and disc disease, ibuprofen and prednisone are prescribed short term also protonix  You are being referred to physical therapy twice weekly for 4 to 6 weeks  Weight loss and back strengthening exercises also good back hygiene will help  Please get cBC, fasting lipid, cmp and EGFr, hBA1C, TSH and vit D 3 to 5 days before next appointment ( [facility])     Chronic Back Pain When back pain lasts longer than 3 months, it is called chronic back pain. Pain may get worse at certain times (flare-ups). There are things you can do at home to manage your pain. Follow these instructions at home: Activity      Avoid bending and other activities that make pain worse.  When standing: ? Keep your upper back and neck straight. ? Keep your shoulders pulled back. ? Avoid slouching.  When sitting: ? Keep your back straight. ? Relax your shoulders. Do not round your shoulders or pull them backward.  Do not sit or stand in one place for long periods of time.  Take short rest breaks during the day. Lying down or standing is usually better than sitting. Resting can help relieve pain.  When sitting or lying down for a long time, do some mild activity or stretching. This will help to prevent stiffness and pain.  Get regular exercise. Ask your doctor what activities are safe for you.  Do not lift anything that is heavier than 10 lb (4.5 kg). To prevent injury when you lift things: ? Bend your knees. ? Keep the weight close to your body. ? Avoid twisting. Managing pain  If told, put ice on the painful area. Your doctor may tell you to use ice for 24-48 hours after a flare-up starts. ? Put ice in a plastic bag. ? Place a towel between your skin and the bag. ? Leave the ice on for 20 minutes, 2-3  times a day.  If told, put heat on the painful area as often as told by your doctor. Use the heat source that your doctor recommends, such as a moist heat pack or a heating pad. ? Place a towel between your skin and the heat source. ? Leave the heat on for 20-30 minutes. ? Remove the heat if your skin turns bright red. This is especially important if you are unable to feel pain, heat, or cold. You may have a greater risk of getting burned.  Soak in a warm bath. This can help relieve pain.  Take over-the-counter and prescription medicines only as told by your doctor. General instructions  Sleep on a firm mattress. Try lying on your side with your knees slightly bent. If you lie on your back, put a pillow under your knees.  Keep all follow-up visits as told by your doctor. This is important. Contact a doctor if:  You have pain that does not get better with rest or medicine. Get help right away if:  One or both of your arms or legs feel weak.  One or both of your arms or legs lose feeling (numbness).  You have trouble controlling when you poop (bowel movement) or pee (urinate).  You feel sick to your stomach (nauseous).  You throw up (vomit).  You have belly (abdominal) pain.  You have shortness of  breath.  You pass out (faint). Summary  When back pain lasts longer than 3 months, it is called chronic back pain.  Pain may get worse at certain times (flare-ups).  Use ice and heat as told by your doctor. Your doctor may tell you to use ice after flare-ups. This information is not intended to replace advice given to you by your health care provider. Make sure you discuss any questions you have with your health care provider. Document Released: 04/19/2008 Document Revised: 06/16/2017 Document Reviewed: 06/16/2017 Elsevier Interactive Patient Education  2019 [employer].

## 2019-05-22 ENCOUNTER — Ambulatory Visit: Payer: BC Managed Care – PPO | Admitting: Family Medicine

## 2019-05-24 ENCOUNTER — Ambulatory Visit: Payer: Self-pay | Admitting: Family Medicine

## 2019-05-28 ENCOUNTER — Other Ambulatory Visit: Payer: Self-pay | Admitting: Family Medicine

## 2019-05-31 DIAGNOSIS — R7303 Prediabetes: Secondary | ICD-10-CM | POA: Diagnosis not present

## 2019-05-31 DIAGNOSIS — I10 Essential (primary) hypertension: Secondary | ICD-10-CM | POA: Diagnosis not present

## 2019-05-31 DIAGNOSIS — R5383 Other fatigue: Secondary | ICD-10-CM | POA: Diagnosis not present

## 2019-05-31 DIAGNOSIS — Z13220 Encounter for screening for lipoid disorders: Secondary | ICD-10-CM | POA: Diagnosis not present

## 2019-05-31 DIAGNOSIS — E559 Vitamin D deficiency, unspecified: Secondary | ICD-10-CM | POA: Diagnosis not present

## 2019-06-01 LAB — CBC
HCT: 41.2 % (ref 35.0–45.0)
Hemoglobin: 14.1 g/dL (ref 11.7–15.5)
MCH: 31.5 pg (ref 27.0–33.0)
MCHC: 34.2 g/dL (ref 32.0–36.0)
MCV: 92 fL (ref 80.0–100.0)
MPV: 9.3 fL (ref 7.5–12.5)
Platelets: 339 10*3/uL (ref 140–400)
RBC: 4.48 10*6/uL (ref 3.80–5.10)
RDW: 13.1 % (ref 11.0–15.0)
WBC: 6.5 10*3/uL (ref 3.8–10.8)

## 2019-06-01 LAB — COMPLETE METABOLIC PANEL WITH GFR
AG Ratio: 1.7 (calc) (ref 1.0–2.5)
ALT: 28 U/L (ref 6–29)
AST: 19 U/L (ref 10–30)
Albumin: 4.4 g/dL (ref 3.6–5.1)
Alkaline phosphatase (APISO): 56 U/L (ref 31–125)
BUN: 11 mg/dL (ref 7–25)
CO2: 27 mmol/L (ref 20–32)
Calcium: 9.7 mg/dL (ref 8.6–10.2)
Chloride: 100 mmol/L (ref 98–110)
Creat: 0.79 mg/dL (ref 0.50–1.10)
GFR, Est African American: 106 mL/min/{1.73_m2} (ref >=60)
GFR, Est Non African American: 91 mL/min/{1.73_m2} (ref >=60)
Globulin: 2.6 g/dL (calc) (ref 1.9–3.7)
Glucose, Bld: 101 mg/dL — ABNORMAL HIGH (ref 65–99)
Potassium: 4.1 mmol/L (ref 3.5–5.3)
Sodium: 137 mmol/L (ref 135–146)
Total Bilirubin: 0.3 mg/dL (ref 0.2–1.2)
Total Protein: 7 g/dL (ref 6.1–8.1)

## 2019-06-01 LAB — LIPID PANEL
Cholesterol: 135 mg/dL (ref <200)
HDL: 58 mg/dL (ref >=50)
LDL Cholesterol (Calc): 63 mg/dL (calc)
Non-HDL Cholesterol (Calc): 77 mg/dL (calc) (ref <130)
Total CHOL/HDL Ratio: 2.3 (calc) (ref <5.0)
Triglycerides: 62 mg/dL (ref <150)

## 2019-06-01 LAB — HEMOGLOBIN A1C
Hgb A1c MFr Bld: 6.2 % of total Hgb — ABNORMAL HIGH (ref <5.7)
Mean Plasma Glucose: 131 (calc)
eAG (mmol/L): 7.3 (calc)

## 2019-06-01 LAB — TSH: TSH: 1.69 mIU/L

## 2019-06-01 LAB — VITAMIN D 25 HYDROXY (VIT D DEFICIENCY, FRACTURES): Vit D, 25-Hydroxy: 46 ng/mL (ref 30–100)

## 2019-06-03 ENCOUNTER — Encounter: Payer: Self-pay | Admitting: Family Medicine

## 2019-06-06 ENCOUNTER — Ambulatory Visit: Payer: BC Managed Care – PPO | Admitting: Family Medicine

## 2019-06-12 ENCOUNTER — Other Ambulatory Visit: Payer: Self-pay | Admitting: Family Medicine

## 2019-06-14 ENCOUNTER — Other Ambulatory Visit: Payer: Self-pay

## 2019-06-14 ENCOUNTER — Encounter: Payer: Self-pay | Admitting: Family Medicine

## 2019-06-14 ENCOUNTER — Ambulatory Visit (INDEPENDENT_AMBULATORY_CARE_PROVIDER_SITE_OTHER): Payer: BC Managed Care – PPO | Admitting: Family Medicine

## 2019-06-14 VITALS — BP 128/84 | HR 101 | Temp 97.8°F | Resp 16 | Ht 67.0 in | Wt 344.0 lb

## 2019-06-14 DIAGNOSIS — M5441 Lumbago with sciatica, right side: Secondary | ICD-10-CM | POA: Diagnosis not present

## 2019-06-14 DIAGNOSIS — M5442 Lumbago with sciatica, left side: Secondary | ICD-10-CM

## 2019-06-14 DIAGNOSIS — I10 Essential (primary) hypertension: Secondary | ICD-10-CM | POA: Diagnosis not present

## 2019-06-14 DIAGNOSIS — Z9109 Other allergy status, other than to drugs and biological substances: Secondary | ICD-10-CM

## 2019-06-14 DIAGNOSIS — F322 Major depressive disorder, single episode, severe without psychotic features: Secondary | ICD-10-CM | POA: Diagnosis not present

## 2019-06-14 MED ORDER — MONTELUKAST SODIUM 10 MG PO TABS: 10.0000 mg | ORAL_TABLET | Freq: Every day | ORAL | 3 refills | Status: DC

## 2019-06-14 MED ORDER — PREDNISONE 5 MG (21) PO TBPK: 5.0000 mg | ORAL_TABLET | ORAL | 0 refills | Status: DC

## 2019-06-14 MED ORDER — PHENTERMINE HCL 37.5 MG PO TABS: ORAL_TABLET | ORAL | 1 refills | Status: DC

## 2019-06-14 MED ORDER — PANTOPRAZOLE SODIUM 20 MG PO TBEC: 20.0000 mg | DELAYED_RELEASE_TABLET | Freq: Every day | ORAL | 0 refills | Status: DC

## 2019-06-14 MED ORDER — IBUPROFEN 800 MG PO TABS: 800.0000 mg | ORAL_TABLET | Freq: Three times a day (TID) | ORAL | 0 refills | Status: DC

## 2019-06-14 MED ORDER — KETOROLAC TROMETHAMINE 60 MG/2ML IM SOLN
60.0000 mg | Freq: Once | INTRAMUSCULAR | Status: AC
  Administered 2019-06-14: 60 mg via INTRAMUSCULAR

## 2019-06-14 MED ORDER — VENLAFAXINE HCL ER 150 MG PO CP24: 150.0000 mg | ORAL_CAPSULE | Freq: Every day | ORAL | 3 refills | Status: DC

## 2019-06-14 MED ORDER — METHYLPREDNISOLONE ACETATE 80 MG/ML IJ SUSP
80.0000 mg | Freq: Once | INTRAMUSCULAR | Status: AC
  Administered 2019-06-14: 80 mg via INTRAMUSCULAR

## 2019-06-14 NOTE — Progress Notes (Signed)
Jennifer [name]     MRN: [id number]      DOB: Mar 22, 1976   HPI Jennifer [name] is here for follow up and re-evaluation of chronic medical conditions, Overwhelmed , depressed, grieving 3 recent losses, uncontrolled eating and uncontrolled back and knee pain Feel useless , increased sob with activity 2 week h/o cough and frontal pressure, clear drainge , no fever or chills 3 recent losses, and she is overwhelmed, depressed, feels worthless Increased back and knee pain significantly limiting mobility, no recent trauma but significant weight gain ROS Denies recent fever or chills.  Denies chest pains, palpitations and leg swelling Denies abdominal pain, nausea, vomiting,diarrhea or constipation.   Denies dysuria, frequency, hesitancy or incontinence.  Denies headaches, seizures, numbness, or tingling. . Denies skin break down or rash.   PE  BP 128/84   Pulse (!) 101   Temp 97.8 F (36.6 C) (Temporal)   Resp 16   Ht 5\' 7"  (1.702 m)   Wt (!) 344 lb (156 kg)   SpO2 98%   BMI 53.88 kg/m   Patient alert and oriented and in no cardiopulmonary distress.  HEENT: No facial asymmetry, EOMI,   oropharynx pink and moist.  Neck supple no JVD, no mass.fron tal sinus tenderness  Chest: Clear to auscultation bilaterally.  CVS: S1, S2 no murmurs, no S3.Regular rate.  ABD: Soft non tender.   Ext: No edema  MS: decreased  ROM spine, Skin: Intact, no ulcerations or rash noted.  Psych: Good eye contact, flat  affect. Memory intact not anxious tearful and depressed  appearing.  CNS: CN 2-12 intact, power,  normal throughout.no focal deficits noted.   Assessment & Plan  Back pain Uncontrolled.Toradol and depo medrol administered IM in the office , to be followed by a short course of oral prednisone and NSAIDS.   Depression, major, single episode, severe (HCC) increase medication and refer to telepsych  Morbid obesity (HCC)  Patient re-educated about  the importance of commitment to a   minimum of 150 minutes of exercise per week as able.  The importance of healthy food choices with portion control discussed, as well as eating regularly and within a 12 hour window most days. The need to choose "clean , green" food 50 to 75% of the time is discussed, as well as to make water the primary drink and set a goal of 64 ounces water daily.    Weight /BMI 06/14/2019 05/08/2019 01/22/2019  WEIGHT 344 lb 320 lb 323 lb  HEIGHT 5\' 7"  5\' 7"  5\' 7"   BMI 53.88 kg/m2 50.12 kg/m2 50.59 kg/m2      Environmental allergies Symptomatic and uncontrolled, start singulair

## 2019-06-14 NOTE — Patient Instructions (Addendum)
Follow-up with MD in 5 weeks call if you need me sooner.  Medications in office today for uncontrolled back and knee pain Toradol 60 mg IM and Depo-Medrol 80 mg IM.  Ibuprofen and prednisone is also prescribed for short.  Depression is uncontrolled and he is currently experiencing grief from recent loss.   accept our convalescence please.  Increasing dose of Effexor to 150 to 75 mg daily and I am referring you  back prescribing restarting phentermine half tablet once daily. Singulair is prescribed for cough and allergies

## 2019-06-14 NOTE — Assessment & Plan Note (Signed)
Uncontrolled.Toradol and depo medrol administered IM in the office , to be followed by a short course of oral prednisone and NSAIDS.  

## 2019-06-18 ENCOUNTER — Encounter: Payer: Self-pay | Admitting: Family Medicine

## 2019-06-18 ENCOUNTER — Ambulatory Visit (HOSPITAL_COMMUNITY): Payer: BC Managed Care – PPO | Attending: Family Medicine | Admitting: Physical Therapy

## 2019-06-18 ENCOUNTER — Other Ambulatory Visit: Payer: Self-pay

## 2019-06-18 ENCOUNTER — Ambulatory Visit (HOSPITAL_COMMUNITY)
Admission: RE | Admit: 2019-06-18 | Discharge: 2019-06-18 | Disposition: A | Discharge status: Home / Self Care | Payer: BC Managed Care – PPO | Source: Ambulatory Visit | Attending: Family Medicine | Admitting: Family Medicine

## 2019-06-18 ENCOUNTER — Encounter (HOSPITAL_COMMUNITY): Payer: Self-pay | Admitting: Physical Therapy

## 2019-06-18 DIAGNOSIS — F322 Major depressive disorder, single episode, severe without psychotic features: Secondary | ICD-10-CM

## 2019-06-18 DIAGNOSIS — M6281 Muscle weakness (generalized): Secondary | ICD-10-CM

## 2019-06-18 DIAGNOSIS — M545 Low back pain, unspecified: Secondary | ICD-10-CM

## 2019-06-18 DIAGNOSIS — Z1231 Encounter for screening mammogram for malignant neoplasm of breast: Secondary | ICD-10-CM | POA: Insufficient documentation

## 2019-06-18 DIAGNOSIS — Z9109 Other allergy status, other than to drugs and biological substances: Secondary | ICD-10-CM | POA: Insufficient documentation

## 2019-06-18 DIAGNOSIS — R29898 Other symptoms and signs involving the musculoskeletal system: Secondary | ICD-10-CM | POA: Insufficient documentation

## 2019-06-18 DIAGNOSIS — G8929 Other chronic pain: Secondary | ICD-10-CM | POA: Diagnosis not present

## 2019-06-18 HISTORY — DX: Major depressive disorder, single episode, severe without psychotic features: F32.2

## 2019-06-18 IMAGING — MG DIGITAL SCREENING BILATERAL MAMMOGRAM WITH TOMO AND CAD
6 of 10 series · 6 of 30 positions shown · non-contrast
Comparison: Previous exam(s).

ACR Breast Density Category a: The breast tissue is almost entirely
fatty.

CLINICAL DATA: Screening.

EXAM:
DIGITAL SCREENING BILATERAL MAMMOGRAM WITH TOMO AND CAD

[L MLO synth-2D (1 of 2)]
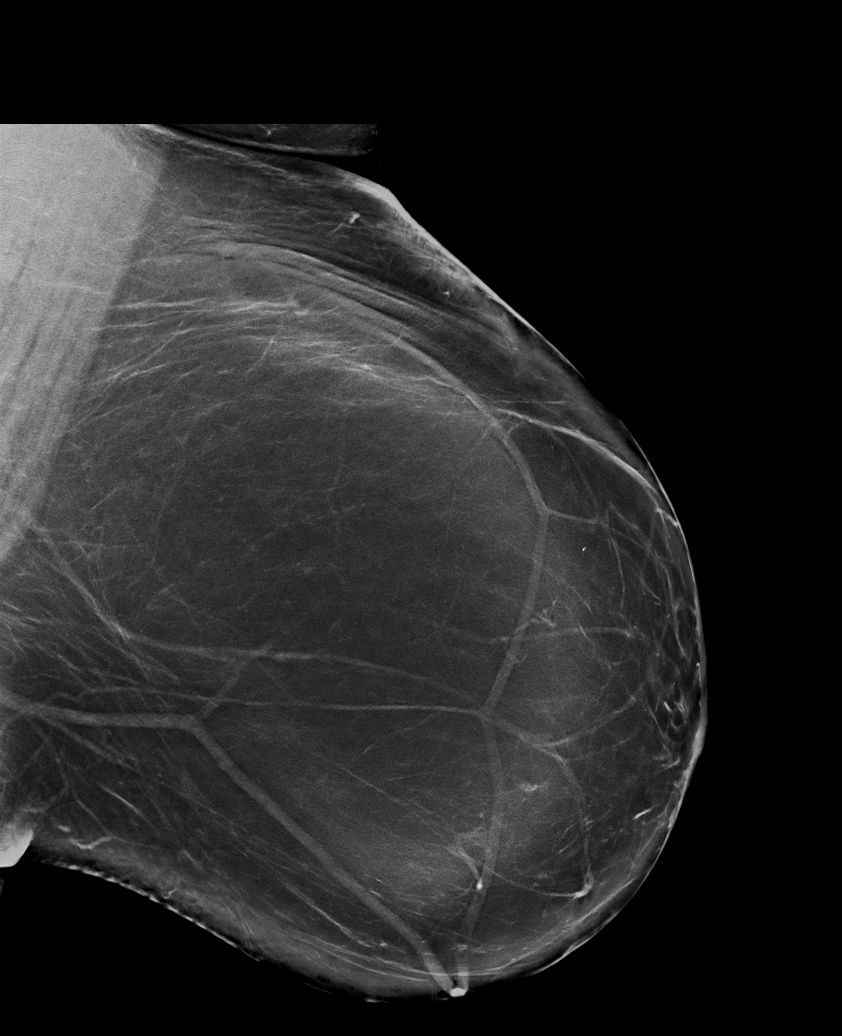

[L MLO synth-2D (2 of 2)]
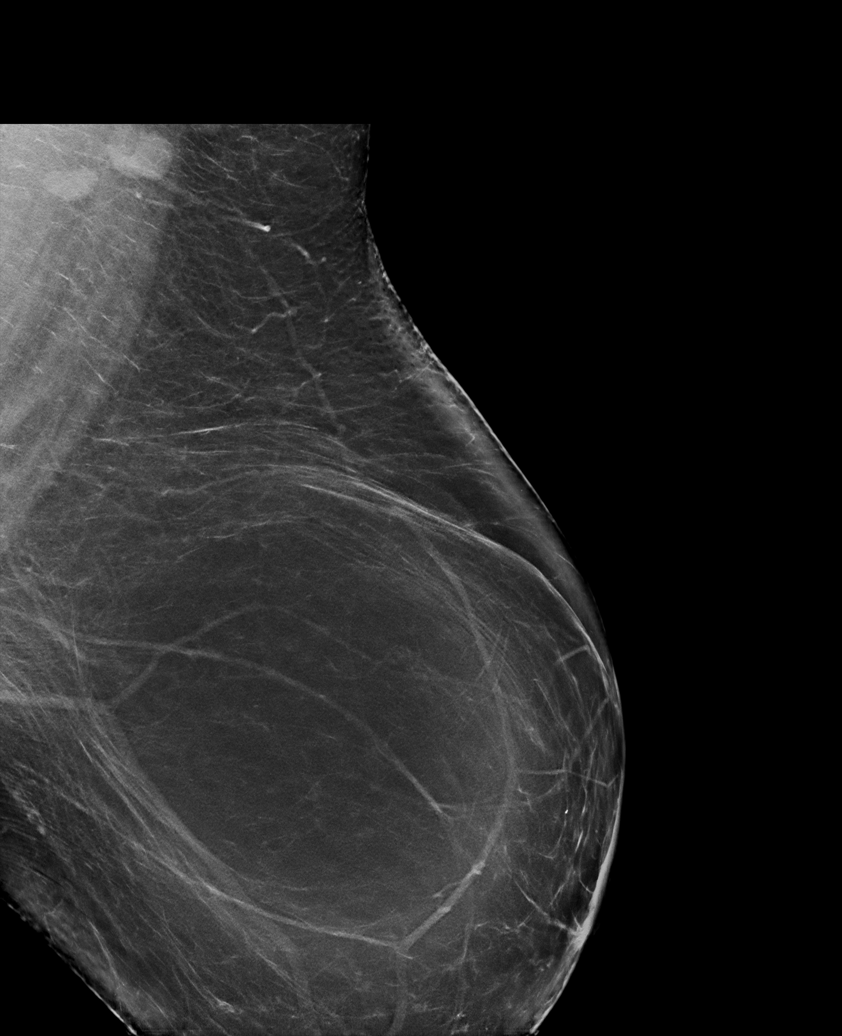

[L CC synth-2D]
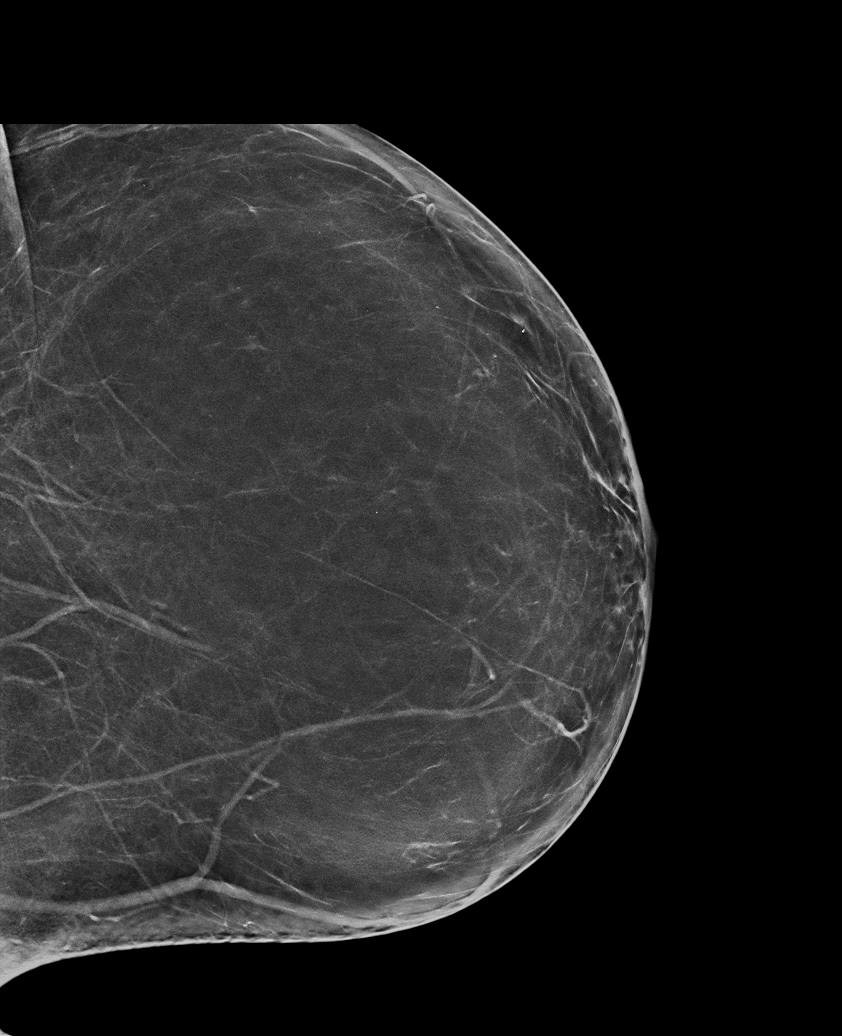

[R CC synth-2D]
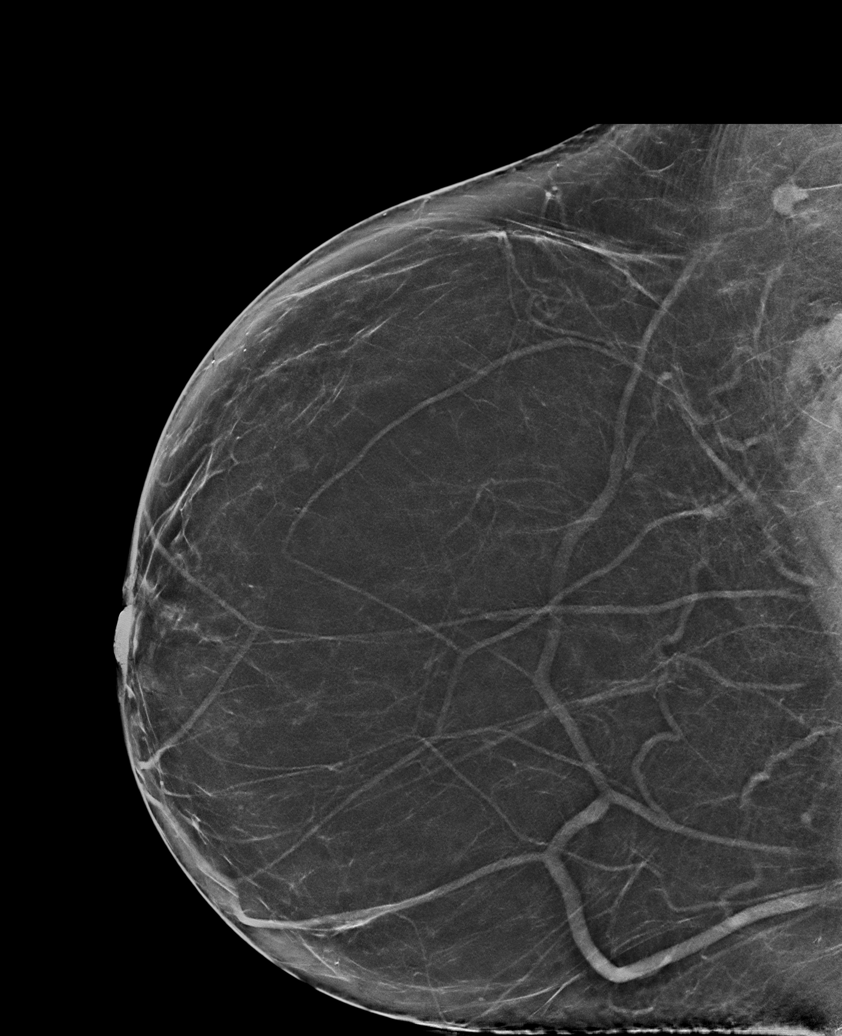

[R MLO synth-2D]
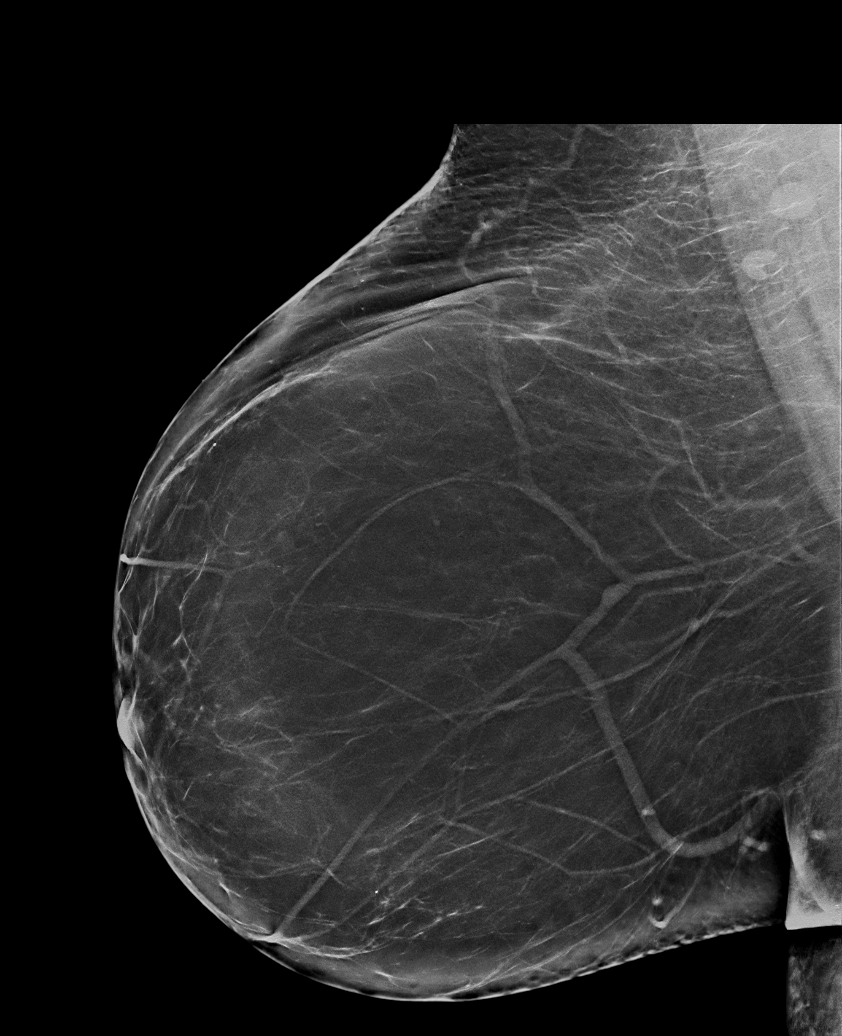

[L CC tomo · tomo slice 39/76.0]
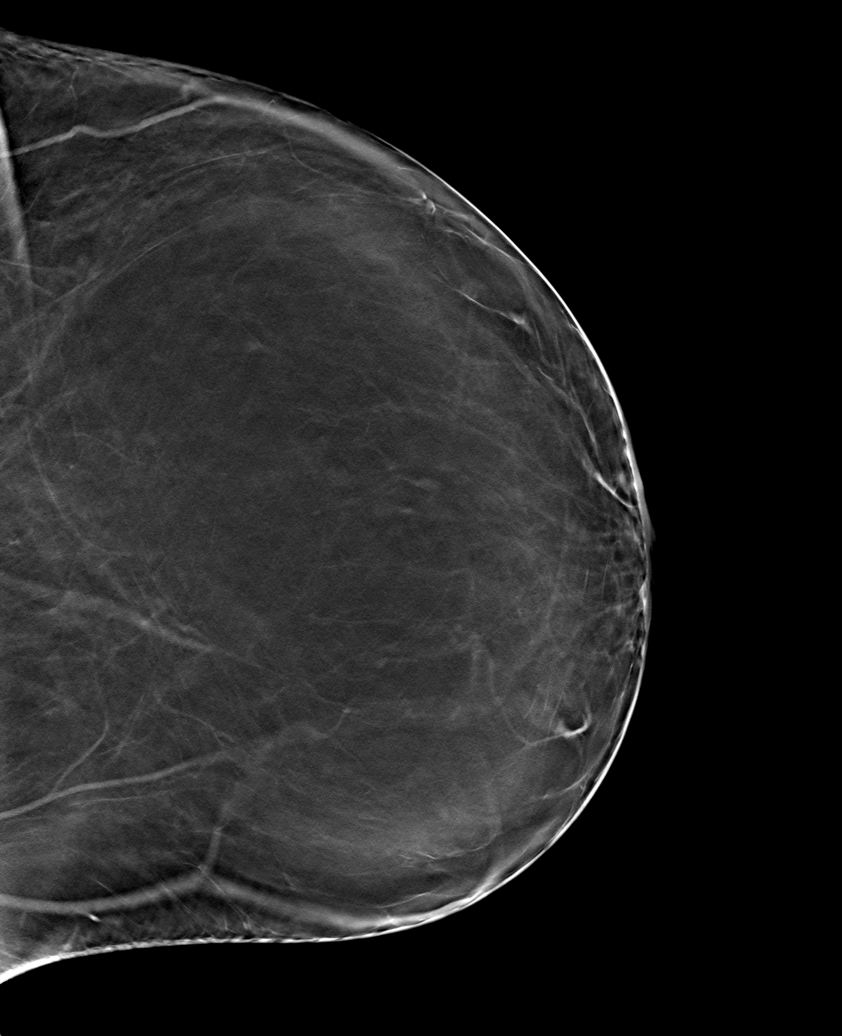

[6 of 30 positions shown; findings below may reference images not displayed]

FINDINGS: There are no findings suspicious for malignancy. Images were
processed with CAD.
IMPRESSION: No mammographic evidence of malignancy. A result letter of this
screening mammogram will be mailed directly to the patient.

RECOMMENDATION:
Screening mammogram in one year. (Code:[TA])

BI-RADS CATEGORY  1: Negative.

## 2019-06-18 NOTE — Assessment & Plan Note (Signed)
Symptomatic and uncontrolled, start singulair

## 2019-06-18 NOTE — Patient Instructions (Signed)
Double Knee to Chest (Flexion)    Gently pull both knees toward chest. Feel stretch in lower back or buttock area. Breathing deeply, Hold __20__ seconds. Repeat __3__ times. Do __1-2__ sessions per day.  http://gt2.exer.us/228   Copyright  VHI. All rights reserved.

## 2019-06-18 NOTE — Assessment & Plan Note (Signed)
increase medication and refer to telepsych

## 2019-06-18 NOTE — Therapy (Signed)
Diverticulosis 11/23/2015  . Hemorrhoids 03/26/2015  . Dermatomycosis 12/18/2014  . Insomnia due to other mental disorder 07/10/2014  . GAD (generalized anxiety disorder) 07/10/2014  . FHx: cancer of digestive organ 02/16/2014  . Piles (hemorrhoids) 02/14/2014  . Abnormal finding on Pap smear, HPV DNA positive 11/29/2013  . Allergic rhinitis 02/12/2013  . Prediabetes 03/26/2012  . Dyspepsia 03/22/2012  . TUBERCULOSIS 11/02/2010  . Alopecia (capitis) totalis 11/02/2010  . Chronic fatigue 07/23/2009  . Morbid obesity ([place]) 11/24/2007  . Depression with anxiety 11/24/2007   Clarene Critchley PT, DPT 5:48 PM, 06/18/19 [place], [place], [address] Phone: [phone]   Fax:  [phone]  Name: Jennifer [name] MRN: [id number] Date of Birth: 07-Dec-1975  Diverticulosis 11/23/2015  . Hemorrhoids 03/26/2015  . Dermatomycosis 12/18/2014  . Insomnia due to other mental disorder 07/10/2014  . GAD (generalized anxiety disorder) 07/10/2014  . FHx: cancer of digestive organ 02/16/2014  . Piles (hemorrhoids) 02/14/2014  . Abnormal finding on Pap smear, HPV DNA positive 11/29/2013  . Allergic rhinitis 02/12/2013  . Prediabetes 03/26/2012  . Dyspepsia 03/22/2012  . TUBERCULOSIS 11/02/2010  . Alopecia (capitis) totalis 11/02/2010  . Chronic fatigue 07/23/2009  . Morbid obesity ([place]) 11/24/2007  . Depression with anxiety 11/24/2007   Clarene Critchley PT, DPT 5:48 PM, 06/18/19 [place], [place], [address] Phone: [phone]   Fax:  [phone]  Name: Jennifer [name] MRN: [id number] Date of Birth: 07-Dec-1975  Diverticulosis 11/23/2015  . Hemorrhoids 03/26/2015  . Dermatomycosis 12/18/2014  . Insomnia due to other mental disorder 07/10/2014  . GAD (generalized anxiety disorder) 07/10/2014  . FHx: cancer of digestive organ 02/16/2014  . Piles (hemorrhoids) 02/14/2014  . Abnormal finding on Pap smear, HPV DNA positive 11/29/2013  . Allergic rhinitis 02/12/2013  . Prediabetes 03/26/2012  . Dyspepsia 03/22/2012  . TUBERCULOSIS 11/02/2010  . Alopecia (capitis) totalis 11/02/2010  . Chronic fatigue 07/23/2009  . Morbid obesity ([place]) 11/24/2007  . Depression with anxiety 11/24/2007   Clarene Critchley PT, DPT 5:48 PM, 06/18/19 [place], [place], [address] Phone: [phone]   Fax:  [phone]  Name: Jennifer [name] MRN: [id number] Date of Birth: 07-Dec-1975  [place], [place], [address] Phone: [phone]   Fax:  [phone]  Physical Therapy Evaluation  Patient Details  Name: Jennifer [name] MRN: [id number] Date of Birth: 07-Jan-1976 Referring Provider (PT): [name], MD   Encounter Date: 06/18/2019  PT End of Session - 06/18/19 1730    Visit Number  1    Number of Visits  8    Date for PT Re-Evaluation  07/16/19    Authorization Type  [employer] PPO: Visits based on medical necessity 30 visits PT/OT/Chiro    Authorization Time Period  06/18/19 - 07/16/19    Authorization - Visit Number  1    Authorization - Number of Visits  30    PT Start Time  1628    PT Stop Time  1717    PT Time Calculation (min)  49 min    Activity Tolerance  Patient tolerated treatment well    Behavior During Therapy  [facility] for tasks assessed/performed       Past Medical History:  Diagnosis Date  . Abnormal vaginal Pap smear   . Alopecia   . Depression   . Diabetes mellitus without complication ([place])   . Diverticulosis   . Family history of diabetes mellitus   . Hemorrhoids   . History of recurrent UTIs   . Obesity     Past Surgical History:  Procedure Laterality Date  . APPENDECTOMY    . COLONOSCOPY N/A 03/13/2014   Dr. Gala Romney- grade 3 hemorrhoids, colonic diverticulosis bx= benign lymphoid polyp  . KNEE ARTHROSCOPY     right    There were no vitals filed for this visit.   Subjective Assessment - 06/18/19 1637    Subjective  Patient reported that when she walks she feels like her back starts to bother her. She said before the pandemic she was going to the [facility] daily and that she had lost some weight, but during the pandemic she gained it back. She said the back pain started getting worse in March, but that it had been going on before that for about a year. She reported that standing up for a long time her back starts hurting. Patient denied any urgency with bowel and bladder  function. She denied any tingling or numbness. Patient reported that she does a lot of bending for work. 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 She reported trying to make her back "crack" to make it feel better. Patient reported right knee arthroscopy in the 1990s. Patient reported that her MD gave her 2 cortisone shots last week in her back and that it only helped a little bit initially. She reported a 10/10 maximum of her pain.    Pertinent History  History of right knee scope    Limitations  Lifting;Standing;Walking;House hold activities    How long can you sit comfortably?  Not limited    How long can you stand comfortably?  10 minutes    How long can you walk comfortably?  5 minutes    Diagnostic tests  11/28/17 x-ray: DDD at L4-5 mild anterolisthesis facet joint hypertorphy at L4-5 and L5-S1    Patient Stated Goals  Be able to stand up better and learn exercises to help back pain    Currently in Pain?  No/denies         [facility] PT Assessment - 06/18/19 0001      Assessment   Medical Diagnosis  DDD, Lumbar  [place], [place], [address] Phone: [phone]   Fax:  [phone]  Physical Therapy Evaluation  Patient Details  Name: Jennifer [name] MRN: [id number] Date of Birth: 07-Jan-1976 Referring Provider (PT): [name], MD   Encounter Date: 06/18/2019  PT End of Session - 06/18/19 1730    Visit Number  1    Number of Visits  8    Date for PT Re-Evaluation  07/16/19    Authorization Type  [employer] PPO: Visits based on medical necessity 30 visits PT/OT/Chiro    Authorization Time Period  06/18/19 - 07/16/19    Authorization - Visit Number  1    Authorization - Number of Visits  30    PT Start Time  1628    PT Stop Time  1717    PT Time Calculation (min)  49 min    Activity Tolerance  Patient tolerated treatment well    Behavior During Therapy  [facility] for tasks assessed/performed       Past Medical History:  Diagnosis Date  . Abnormal vaginal Pap smear   . Alopecia   . Depression   . Diabetes mellitus without complication ([place])   . Diverticulosis   . Family history of diabetes mellitus   . Hemorrhoids   . History of recurrent UTIs   . Obesity     Past Surgical History:  Procedure Laterality Date  . APPENDECTOMY    . COLONOSCOPY N/A 03/13/2014   Dr. Gala Romney- grade 3 hemorrhoids, colonic diverticulosis bx= benign lymphoid polyp  . KNEE ARTHROSCOPY     right    There were no vitals filed for this visit.   Subjective Assessment - 06/18/19 1637    Subjective  Patient reported that when she walks she feels like her back starts to bother her. She said before the pandemic she was going to the [facility] daily and that she had lost some weight, but during the pandemic she gained it back. She said the back pain started getting worse in March, but that it had been going on before that for about a year. She reported that standing up for a long time her back starts hurting. Patient denied any urgency with bowel and bladder  function. She denied any tingling or numbness. Patient reported that she does a lot of bending for work. 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 She reported trying to make her back "crack" to make it feel better. Patient reported right knee arthroscopy in the 1990s. Patient reported that her MD gave her 2 cortisone shots last week in her back and that it only helped a little bit initially. She reported a 10/10 maximum of her pain.    Pertinent History  History of right knee scope    Limitations  Lifting;Standing;Walking;House hold activities    How long can you sit comfortably?  Not limited    How long can you stand comfortably?  10 minutes    How long can you walk comfortably?  5 minutes    Diagnostic tests  11/28/17 x-ray: DDD at L4-5 mild anterolisthesis facet joint hypertorphy at L4-5 and L5-S1    Patient Stated Goals  Be able to stand up better and learn exercises to help back pain    Currently in Pain?  No/denies         [facility] PT Assessment - 06/18/19 0001      Assessment   Medical Diagnosis  DDD, Lumbar

## 2019-06-18 NOTE — Assessment & Plan Note (Signed)
  Patient re-educated about  the importance of commitment to a  minimum of 150 minutes of exercise per week as able.  The importance of healthy food choices with portion control discussed, as well as eating regularly and within a 12 hour window most days. The need to choose "clean , green" food 50 to 75% of the time is discussed, as well as to make water the primary drink and set a goal of 64 ounces water daily.    Weight /BMI 06/14/2019 05/08/2019 01/22/2019  WEIGHT 344 lb 320 lb 323 lb  HEIGHT 5\' 7"  5\' 7"  5\' 7"   BMI 53.88 kg/m2 50.12 kg/m2 50.59 kg/m2

## 2019-06-19 ENCOUNTER — Ambulatory Visit: Payer: BC Managed Care – PPO | Admitting: Family Medicine

## 2019-06-25 ENCOUNTER — Other Ambulatory Visit: Payer: Self-pay

## 2019-06-25 MED ORDER — BUSPIRONE HCL 7.5 MG PO TABS: 7.5000 mg | ORAL_TABLET | Freq: Three times a day (TID) | ORAL | 1 refills | Status: DC

## 2019-06-27 ENCOUNTER — Telehealth (HOSPITAL_COMMUNITY): Payer: Self-pay | Admitting: Family Medicine

## 2019-06-27 NOTE — Telephone Encounter (Signed)
06/27/19  Patient was on the wait list and I offered appts for 8/12 and 8/14 at 415.  She took the 8/14 appt but didn't take 8/12.

## 2019-06-28 ENCOUNTER — Telehealth (HOSPITAL_COMMUNITY): Payer: Self-pay | Admitting: Family Medicine

## 2019-06-28 NOTE — Telephone Encounter (Signed)
pt called to cancel because she said that she knew next week she would have to leave work early for a dentist appt and she is supposed to come here to.  Also she wants to be able to pay next week and she can't pay tomorrow   06/28/19

## 2019-06-29 ENCOUNTER — Ambulatory Visit (HOSPITAL_COMMUNITY): Payer: BC Managed Care – PPO

## 2019-06-29 ENCOUNTER — Encounter

## 2019-07-03 ENCOUNTER — Other Ambulatory Visit: Payer: Self-pay

## 2019-07-03 MED ORDER — BUSPIRONE HCL 7.5 MG PO TABS: 7.5000 mg | ORAL_TABLET | Freq: Three times a day (TID) | ORAL | 0 refills | Status: DC

## 2019-07-05 ENCOUNTER — Ambulatory Visit (HOSPITAL_COMMUNITY): Payer: BC Managed Care – PPO

## 2019-07-05 ENCOUNTER — Telehealth (HOSPITAL_COMMUNITY): Payer: Self-pay
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
## 2019-07-06 ENCOUNTER — Ambulatory Visit (HOSPITAL_COMMUNITY): Payer: BC Managed Care – PPO

## 2019-07-06 ENCOUNTER — Telehealth (HOSPITAL_COMMUNITY): Payer: Self-pay | Admitting: Physical Therapy

## 2019-07-06 ENCOUNTER — Telehealth (HOSPITAL_COMMUNITY): Payer: Self-pay

## 2019-07-06 NOTE — Telephone Encounter (Signed)
She needs to cx she has too much going on -she will try to see [place] next wk

## 2019-07-11 ENCOUNTER — Telehealth (HOSPITAL_COMMUNITY): Payer: Self-pay

## 2019-07-11 ENCOUNTER — Ambulatory Visit (HOSPITAL_COMMUNITY): Payer: BC Managed Care – PPO

## 2019-07-11 NOTE — Telephone Encounter (Signed)
Her work office can not find coverage for her shift to come. She will cx today and Friday and hope they can fine coverage for her 07/17/19.

## 2019-07-13 ENCOUNTER — Ambulatory Visit (HOSPITAL_COMMUNITY): Payer: BC Managed Care – PPO

## 2019-07-15 ENCOUNTER — Other Ambulatory Visit: Payer: Self-pay | Admitting: Family Medicine

## 2019-07-15 DIAGNOSIS — E559 Vitamin D deficiency, unspecified: Secondary | ICD-10-CM

## 2019-07-17 ENCOUNTER — Telehealth (HOSPITAL_COMMUNITY): Payer: Self-pay

## 2019-07-17 ENCOUNTER — Ambulatory Visit (HOSPITAL_COMMUNITY): Payer: BC Managed Care – PPO

## 2019-07-17 NOTE — Telephone Encounter (Signed)
No show, called and spoke to pt who had forgotten about apt today.  Due to length of time and compliance with attendence DC'd from therapy.  Encouraged pt to call MD and get another referral if she feels she will be able to attend.  Pt reports she would like to attend but work schedule has made it difficult.  [address] [place]; [place] [phone]

## 2019-07-18 ENCOUNTER — Ambulatory Visit: Payer: BC Managed Care – PPO | Admitting: Family Medicine

## 2019-07-19 ENCOUNTER — Ambulatory Visit (HOSPITAL_COMMUNITY): Payer: BC Managed Care – PPO

## 2019-07-21 ENCOUNTER — Other Ambulatory Visit: Payer: Self-pay | Admitting: Family Medicine

## 2019-07-24 ENCOUNTER — Encounter (HOSPITAL_COMMUNITY): Payer: Self-pay | Admitting: Physical Therapy

## 2019-07-24 NOTE — Therapy (Signed)
[place], [place], [address] Phone: [phone]   Fax:  [phone]  Patient Details  Name: Jennifer [name] MRN: [id number] Date of Birth: 1975-11-21 Referring Provider:  No ref. provider found  Encounter Date: 07/24/2019   PHYSICAL THERAPY DISCHARGE SUMMARY  Visits from Start of Care: 1   Current functional level related to goals / functional outcomes: Unknown as patient did not return following evaluation. See evaluation note on 06/18/19 for more details.    Remaining deficits: Unknown as patient did not return following evaluation. See evaluation note on 06/18/19 for more details.    Education / Equipment: Patient had been educated on initial HEP and benefits of physical therapy.  Plan: Patient agrees to discharge.  Patient goals were not met. Patient is being discharged due to not returning since the last visit.  ?????    Patient cancelled many appointments and reported that her work schedule conflicted with attending therapy.   Clarene Critchley PT, DPT 8:35 AM, 07/24/19 [place], [place], [address] Phone: [phone]   Fax:  [phone]

## 2019-07-26 ENCOUNTER — Encounter: Payer: Self-pay | Admitting: Family Medicine

## 2019-07-26 ENCOUNTER — Ambulatory Visit (INDEPENDENT_AMBULATORY_CARE_PROVIDER_SITE_OTHER): Payer: BC Managed Care – PPO | Admitting: Family Medicine

## 2019-07-26 ENCOUNTER — Other Ambulatory Visit: Payer: Self-pay

## 2019-07-26 VITALS — BP 128/84 | Ht 67.0 in | Wt 342.0 lb

## 2019-07-26 DIAGNOSIS — N3001 Acute cystitis with hematuria: Secondary | ICD-10-CM | POA: Diagnosis not present

## 2019-07-26 DIAGNOSIS — Z23 Encounter for immunization: Secondary | ICD-10-CM

## 2019-07-26 DIAGNOSIS — N760 Acute vaginitis: Secondary | ICD-10-CM

## 2019-07-26 DIAGNOSIS — N3000 Acute cystitis without hematuria: Secondary | ICD-10-CM

## 2019-07-26 DIAGNOSIS — I10 Essential (primary) hypertension: Secondary | ICD-10-CM

## 2019-07-26 LAB — POCT URINALYSIS DIP (CLINITEK)
Bilirubin, UA: NEGATIVE
Glucose, UA: NEGATIVE mg/dL
Nitrite, UA: NEGATIVE
POC PROTEIN,UA: 30 — AB
Spec Grav, UA: 1.025 (ref 1.010–1.025)
Urobilinogen, UA: 0.2 E.U./dL
pH, UA: 6.5 (ref 5.0–8.0)

## 2019-07-26 MED ORDER — VENLAFAXINE HCL ER 150 MG PO CP24: 150.0000 mg | ORAL_CAPSULE | Freq: Every day | ORAL | 5 refills | Status: DC

## 2019-07-26 MED ORDER — HYDROCHLOROTHIAZIDE 25 MG PO TABS: 25.0000 mg | ORAL_TABLET | Freq: Every day | ORAL | 1 refills | Status: DC

## 2019-07-26 MED ORDER — FLUCONAZOLE 150 MG PO TABS: 150.0000 mg | ORAL_TABLET | Freq: Once | ORAL | 0 refills | Status: AC

## 2019-07-26 MED ORDER — PHENTERMINE HCL 37.5 MG PO TABS: 37.5000 mg | ORAL_TABLET | Freq: Every day | ORAL | 3 refills | Status: DC

## 2019-07-26 MED ORDER — CIPROFLOXACIN HCL 500 MG PO TABS: 500.0000 mg | ORAL_TABLET | Freq: Two times a day (BID) | ORAL | 0 refills | Status: DC

## 2019-07-26 NOTE — Progress Notes (Signed)
Virtual Visit via Telephone Note  I connected with Jennifer [name] on 07/26/19 at  2:00 PM EDT by telephone and verified that I am speaking with the correct person using two identifiers.  Location: Patient: home Provider: office   I discussed the limitations, risks, security and privacy concerns of performing an evaluation and management service by telephone and the availability of in person appointments. I also discussed with the patient that there may be a patient responsible charge related to this service. The patient expressed understanding and agreed to proceed. Visit changed to in office based on reported symptoms and pt came in  History of Present Illness: Chills 4 days ago with urinary frequency and pressure with small amounts of urine. Pink discharge with odor  X 4 days. Requests refill on phentermine, feels that it is helping with her weight loss Denies fever , chills or flank pain Denies sinus pressure, nasal congestion, ear pain or sore throat. Denies chest congestion, productive cough or wheezing. Denies chest pains, palpitations and leg swelling Denies abdominal pain, nausea, vomiting,diarrhea or constipation.    Denies joint pain, swelling and limitation in mobility. Denies headaches, seizures, numbness, or tingling. Denies uncontrolled  depression, anxiety or insomnia. Denies skin break down or rash.       Observations/Objective: BP 128/84   Ht 5\' 7"  (1.702 m)   Wt (!) 342 lb (155.1 kg)   BMI 53.56 kg/m  Good communication with no confusion and intact memory. Alert and oriented x 3 No signs of respiratory distress during speech    Assessment and Plan:  Vulvovaginitis Symptomatic, specimens sent for testing, will treat  Based on result  Acute cystitis with hematuria cipro x 3 days and f/u c/s  Morbid obesity ([place])  Patient re-educated about  the importance of commitment to a  minimum of 150 minutes of exercise per week as able.  The importance of  healthy food choices with portion control discussed, as well as eating regularly and within a 12 hour window most days. The need to choose "clean , green" food 50 to 75% of the time is discussed, as well as to make water the primary drink and set a goal of 64 ounces water daily.    Weight /BMI 07/26/2019 06/14/2019 05/08/2019  WEIGHT 342 lb 344 lb 320 lb  HEIGHT 5\' 7"  5\' 7"  5\' 7"   BMI 53.56 kg/m2 53.88 kg/m2 50.12 kg/m2  continue half phentermine daily    Need for immunization against influenza After obtaining informed consent, the vaccine is  administered , with no adverse effect noted at the time of administration.   Essential hypertension Controlled, no change in medication DASH diet and commitment to daily physical activity for a minimum of 30 minutes discussed and encouraged, as a part of hypertension management. The importance of attaining a healthy weight is also discussed.  BP/Weight 07/26/2019 06/14/2019 05/08/2019 01/22/2019 10/19/2018 06/12/2018 [id number]  Systolic BP 0000000 0000000 123456 123XX123 0000000 123456 123456  Diastolic BP 84 84 82 80 74 80 86  Wt. (Lbs) 342 344 320 323 318 316.12 319  BMI 53.56 53.88 50.12 50.59 49.81 49.51 48.5        Follow Up Instructions:    I discussed the assessment and treatment plan with the patient. The patient was provided an opportunity to ask questions and all were answered. The patient agreed with the plan and demonstrated an understanding of the instructions.   The patient was advised to call back or seek an in-person evaluation if the symptoms  worsen or if the condition fails to improve as anticipated.  I provided 22 minutes of non-face-to-face and face to face  time during this encounter.   Tula Nakayama, MD

## 2019-07-26 NOTE — Patient Instructions (Addendum)
F/u in 4 months, call if you need me before  Please come in today for flu vaccine and accurate weight as we discussed   Please submit in office today urine for testing for bacterial infection (C/S) as well as for BV, trichomonas, yeast , GC and chlamydia  3 days of ciprofloxacin and fluconazole for presumed UTI are prescribed

## 2019-07-27 ENCOUNTER — Other Ambulatory Visit (HOSPITAL_COMMUNITY)
Admission: RE | Admit: 2019-07-27 | Discharge: 2019-07-27 | Disposition: A | Discharge status: Home / Self Care | Payer: BC Managed Care – PPO | Source: Other Acute Inpatient Hospital | Attending: Family Medicine | Admitting: Family Medicine

## 2019-07-27 ENCOUNTER — Encounter: Payer: Self-pay | Admitting: Family Medicine

## 2019-07-27 DIAGNOSIS — N3001 Acute cystitis with hematuria: Secondary | ICD-10-CM | POA: Diagnosis not present

## 2019-07-27 LAB — URINALYSIS, ROUTINE W REFLEX MICROSCOPIC
Bilirubin Urine: NEGATIVE
Glucose, UA: NEGATIVE mg/dL
Ketones, ur: NEGATIVE mg/dL
Nitrite: NEGATIVE
Protein, ur: 100 mg/dL — AB
Specific Gravity, Urine: 1.021 (ref 1.005–1.030)
WBC, UA: >50 WBC/hpf — ABNORMAL HIGH (ref 0–5)
pH: 6 (ref 5.0–8.0)

## 2019-07-27 NOTE — Assessment & Plan Note (Signed)
  Patient re-educated about  the importance of commitment to a  minimum of 150 minutes of exercise per week as able.  The importance of healthy food choices with portion control discussed, as well as eating regularly and within a 12 hour window most days. The need to choose "clean , green" food 50 to 75% of the time is discussed, as well as to make water the primary drink and set a goal of 64 ounces water daily.    Weight /BMI 07/26/2019 06/14/2019 05/08/2019  WEIGHT 342 lb 344 lb 320 lb  HEIGHT 5\' 7"  5\' 7"  5\' 7"   BMI 53.56 kg/m2 53.88 kg/m2 50.12 kg/m2  continue half phentermine daily

## 2019-07-27 NOTE — Assessment & Plan Note (Signed)
Controlled, no change in medication DASH diet and commitment to daily physical activity for a minimum of 30 minutes discussed and encouraged, as a part of hypertension management. The importance of attaining a healthy weight is also discussed.  BP/Weight 07/26/2019 06/14/2019 05/08/2019 01/22/2019 10/19/2018 06/12/2018 [id number]  Systolic BP 0000000 0000000 123456 123XX123 0000000 123456 123456  Diastolic BP 84 84 82 80 74 80 86  Wt. (Lbs) 342 344 320 323 318 316.12 319  BMI 53.56 53.88 50.12 50.59 49.81 49.51 48.5

## 2019-07-27 NOTE — Assessment & Plan Note (Signed)
Symptomatic, specimens sent for testing, will treat  Based on result

## 2019-07-27 NOTE — Assessment & Plan Note (Signed)
cipro x 3 days and f/u c/s

## 2019-07-27 NOTE — Assessment & Plan Note (Signed)
After obtaining informed consent, the vaccine is  administered , with no adverse effect noted at the time of administration.  

## 2019-07-28 ENCOUNTER — Encounter: Payer: Self-pay | Admitting: Family Medicine

## 2019-07-28 LAB — URINE CULTURE: Culture: <10000 — AB

## 2019-08-22 ENCOUNTER — Encounter: Payer: Self-pay | Admitting: Family Medicine

## 2019-08-23 ENCOUNTER — Encounter: Payer: Self-pay | Admitting: Family Medicine

## 2019-08-23 ENCOUNTER — Other Ambulatory Visit: Payer: Self-pay | Admitting: Family Medicine

## 2019-08-23 MED ORDER — CYCLOBENZAPRINE HCL 10 MG PO TABS: 10.0000 mg | ORAL_TABLET | Freq: Three times a day (TID) | ORAL | 0 refills | Status: DC | PRN

## 2019-10-11 ENCOUNTER — Other Ambulatory Visit: Payer: Self-pay | Admitting: Family Medicine

## 2019-11-06 ENCOUNTER — Other Ambulatory Visit: Payer: Self-pay | Admitting: Family Medicine

## 2019-11-29 ENCOUNTER — Ambulatory Visit: Payer: BC Managed Care – PPO | Admitting: Family Medicine

## 2020-01-02 ENCOUNTER — Other Ambulatory Visit: Payer: Self-pay | Admitting: Family Medicine

## 2020-01-10 ENCOUNTER — Encounter: Payer: Self-pay | Admitting: Family Medicine

## 2020-01-10 ENCOUNTER — Other Ambulatory Visit: Payer: Self-pay | Admitting: Family Medicine

## 2020-01-26 ENCOUNTER — Other Ambulatory Visit: Payer: Self-pay

## 2020-01-26 ENCOUNTER — Ambulatory Visit: Payer: Medicaid Other | Attending: Internal Medicine

## 2020-01-26 DIAGNOSIS — Z23 Encounter for immunization: Secondary | ICD-10-CM

## 2020-01-26 NOTE — Progress Notes (Signed)
Covid-19 Vaccination Clinic  Name:  Jennifer [name]    MRN: [id number] DOB: 1975/12/31  01/26/2020  Jennifer [name] was observed post Covid-19 immunization for 15 minutes without incident. She was provided with Vaccine Information Sheet and instruction to access the V-Safe system.   Jennifer [name] was instructed to call 911 with any severe reactions post vaccine: [place] Kitchen Difficulty breathing  . Swelling of face and throat  . A fast heartbeat  . A bad rash all over body  . Dizziness and weakness   Immunizations Administered    Name Date Dose VIS Date Route   Moderna COVID-19 Vaccine 01/26/2020 11:21 AM 0.5 mL 10/16/2019 Intramuscular   Manufacturer: Moderna   Lot: [id number]   NDC: 14782-956-21

## 2020-01-31 ENCOUNTER — Other Ambulatory Visit: Payer: Self-pay | Admitting: Family Medicine

## 2020-02-27 ENCOUNTER — Ambulatory Visit: Payer: Medicaid Other | Attending: Internal Medicine

## 2020-02-27 DIAGNOSIS — Z23 Encounter for immunization: Secondary | ICD-10-CM

## 2020-02-27 NOTE — Progress Notes (Signed)
Covid-19 Vaccination Clinic  Name:  Kamalani Amini    MRN: [id number] DOB: 08-12-1976  02/27/2020  Ms. Chana was observed post Covid-19 immunization for 15 minutes without incident. She was provided with Vaccine Information Sheet and instruction to access the V-Safe system.   Ms. [place] was instructed to call 911 with any severe reactions post vaccine: Mar[place] Kitchen Difficulty breathing  . Swelling of face and throat  . A fast heartbeat  . A bad rash all over body  . Dizziness and weakness   Immunizations Administered    Name Date Dose VIS Date Route   Moderna COVID-19 Vaccine 02/27/2020 11:20 AM 0.5 mL 10/16/2019 Intramuscular   Manufacturer: Moderna   Lot: [id number]   NDC: 30865-784-69

## 2020-05-27 ENCOUNTER — Encounter: Payer: Self-pay | Admitting: Family Medicine

## 2020-05-27 ENCOUNTER — Other Ambulatory Visit: Payer: Self-pay

## 2020-05-27 ENCOUNTER — Ambulatory Visit (INDEPENDENT_AMBULATORY_CARE_PROVIDER_SITE_OTHER): Payer: Self-pay | Admitting: Family Medicine

## 2020-05-27 VITALS — BP 143/97 | HR 96 | Resp 16 | Ht 67.0 in | Wt 354.0 lb

## 2020-05-27 DIAGNOSIS — Z1231 Encounter for screening mammogram for malignant neoplasm of breast: Secondary | ICD-10-CM

## 2020-05-27 DIAGNOSIS — G8929 Other chronic pain: Secondary | ICD-10-CM

## 2020-05-27 DIAGNOSIS — R3912 Poor urinary stream: Secondary | ICD-10-CM

## 2020-05-27 DIAGNOSIS — F411 Generalized anxiety disorder: Secondary | ICD-10-CM

## 2020-05-27 DIAGNOSIS — I10 Essential (primary) hypertension: Secondary | ICD-10-CM

## 2020-05-27 DIAGNOSIS — M5442 Lumbago with sciatica, left side: Secondary | ICD-10-CM

## 2020-05-27 DIAGNOSIS — Z13220 Encounter for screening for lipoid disorders: Secondary | ICD-10-CM

## 2020-05-27 DIAGNOSIS — R7303 Prediabetes: Secondary | ICD-10-CM

## 2020-05-27 DIAGNOSIS — M5441 Lumbago with sciatica, right side: Secondary | ICD-10-CM

## 2020-05-27 DIAGNOSIS — E559 Vitamin D deficiency, unspecified: Secondary | ICD-10-CM

## 2020-05-27 DIAGNOSIS — F322 Major depressive disorder, single episode, severe without psychotic features: Secondary | ICD-10-CM

## 2020-05-27 DIAGNOSIS — Z23 Encounter for immunization: Secondary | ICD-10-CM

## 2020-05-27 LAB — POCT URINALYSIS DIP (CLINITEK)
Bilirubin, UA: NEGATIVE
Glucose, UA: NEGATIVE mg/dL
Ketones, POC UA: NEGATIVE mg/dL
Leukocytes, UA: NEGATIVE
Nitrite, UA: NEGATIVE
POC PROTEIN,UA: 100 — AB
Spec Grav, UA: 1.025 (ref 1.010–1.025)
Urobilinogen, UA: 0.2 E.U./dL
pH, UA: 7 (ref 5.0–8.0)

## 2020-05-27 LAB — POCT GLYCOSYLATED HEMOGLOBIN (HGB A1C)
HbA1c POC (<> result, manual entry): 6.1 % (ref 4.0–5.6)
HbA1c, POC (controlled diabetic range): 6.1 % (ref 0.0–7.0)
HbA1c, POC (prediabetic range): 6.1 % (ref 5.7–6.4)
Hemoglobin A1C: 6.1 % — AB (ref 4.0–5.6)

## 2020-05-27 MED ORDER — TRIAMTERENE-HCTZ 37.5-25 MG PO TABS: 1.0000 | ORAL_TABLET | Freq: Every day | ORAL | 3 refills | Status: DC

## 2020-05-27 MED ORDER — CLOTRIMAZOLE-BETAMETHASONE 1-0.05 % EX CREA: 1.0000 "application " | TOPICAL_CREAM | Freq: Two times a day (BID) | CUTANEOUS | 1 refills | Status: DC

## 2020-05-27 MED ORDER — ORLISTAT 120 MG PO CAPS: 120.0000 mg | ORAL_CAPSULE | Freq: Three times a day (TID) | ORAL | 3 refills | Status: DC

## 2020-05-27 NOTE — Assessment & Plan Note (Signed)
Chronic and uncontrolled, requests chiropractor, will refer

## 2020-05-27 NOTE — Patient Instructions (Addendum)
F/U in office with mD, re evaluate blood pressur and weight  in 6 to 8 weeks, call if you need me sooner  TdaP in office today  CBC, lipid, cmp and eGFR, tSH and vit D today  Mammogram to be scheduled at checkout You are referred to  Chiropractor for chronic back pain.  You are referred to urology because of difficulty emptying your bladder.  1500-calorie diet she is in office today.  Xenical prescribed to help with changing your eating habits.  Increase vegetable and fruit eat on a regular schedule and commit to 64 ounces of water daily.  Blood pressure is uncontrolled  stop HCTZ new is triamterene 1 daily  You are referred to Psychiatry to manage depression, important top keep this appointment  Thanks for choosing [facility], we consider it a privelige to serve you.

## 2020-05-27 NOTE — Assessment & Plan Note (Signed)
2 week history, refer urology, CCUA normal, blood due to menses

## 2020-05-27 NOTE — Progress Notes (Signed)
Jennifer [name]     MRN: [id number]      DOB: 01/29/1976   HPI Jennifer [name] is here for follow up and re-evaluation of chronic medical conditions, medication management and review of any available recent lab and radiology data.  Preventive health is updated, specifically  Cancer screening and Immunization.   Questions or concerns regarding consultations or procedures which the PT has had in the interim are  addressed. The PT denies any adverse reactions to current medications since the last visit.  Inadequate emptying of bladder x 10 days. No fever , chills or flank pain  ROS Denies recent fever or chills. Denies sinus pressure, nasal congestion, ear pain or sore throat. Denies chest congestion, productive cough or wheezing. Denies chest pains, palpitations and leg swelling Denies abdominal pain, nausea, vomiting,diarrhea or constipation.   Denies dysuria, frequency, hesitancy or incontinence. C/o disabling back pain limiting mobility, wants to see chirporactor Denies headaches, seizures, numbness, or tingling. C/o increased depression, not doing well, not suicidal or  homicidal. Denies skin break down or rash.   PE  BP (!) 143/97   Pulse 96   Resp 16   Ht 5\' 7"  (1.702 m)   Wt (!) 354 lb (160.6 kg)   SpO2 93%   BMI 55.44 kg/m   Patient alert and oriented and in no cardiopulmonary distress.  HEENT: No facial asymmetry, EOMI,     Neck supple .  Chest: Clear to auscultation bilaterally.  CVS: S1, S2 no murmurs, no S3.Regular rate.  ABD: Soft non tender.   Ext: No edema  MS: decreased ROM spine, adequate in shoulders, hips and knees.  Skin: Intact, no ulcerations or rash noted.  Psych: Good eye contact, normal affect. Memory intact  anxious and  depressed appearing.  CNS: CN 2-12 intact, power,  normal throughout.no focal deficits noted.   Assessment & Plan Poor urinary stream 2 week history, refer urology, CCUA normal, blood due to menses  Back pain Chronic and  uncontrolled, requests chiropractor, will refer  Depression, major, single episode, severe (HCC) Uncontrolled will refer to Psychiatry, score of 18, not suicidal or homicidal  GAD (generalized anxiety disorder) Uncontrolled , refer to Psychiatry  Essential hypertension UnControlled,  change in medication, stop hCTZ , start triamterene DASH diet and commitment to daily physical activity for a minimum of 30 minutes discussed and encouraged, as a part of hypertension management. The importance of attaining a healthy weight is also discussed.  BP/Weight 05/27/2020 07/26/2019 06/14/2019 05/08/2019 01/22/2019 10/19/2018 06/12/2018  Systolic BP 143 128 128 120 122 128 120  Diastolic BP 97 84 84 82 80 74 80  Wt. (Lbs) 354 342 344 320 323 318 316.12  BMI 55.44 53.56 53.88 50.12 50.59 49.81 49.51       Morbid obesity (HCC)  Patient re-educated about  the importance of commitment to a  minimum of 150 minutes of exercise per week as able.  The importance of healthy food choices with portion control discussed, as well as eating regularly and within a 12 hour window most days. The need to choose "clean , green" food 50 to 75% of the time is discussed, as well as to make water the primary drink and set a goal of 64 ounces water daily.    Weight /BMI 05/27/2020 07/26/2019 06/14/2019  WEIGHT 354 lb 342 lb 344 lb  HEIGHT 5\' 7"  5\' 7"  5\' 7"   BMI 55.44 kg/m2 53.56 kg/m2 53.88 kg/m2

## 2020-05-27 NOTE — Assessment & Plan Note (Addendum)
Uncontrolled will refer to Psychiatry, score of 18, not suicidal or homicidal

## 2020-05-30 LAB — CBC
HCT: 42.2 % (ref 35.0–45.0)
Hemoglobin: 14.1 g/dL (ref 11.7–15.5)
MCH: 30.6 pg (ref 27.0–33.0)
MCHC: 33.4 g/dL (ref 32.0–36.0)
MCV: 91.5 fL (ref 80.0–100.0)
MPV: 9.5 fL (ref 7.5–12.5)
Platelets: 315 10*3/uL (ref 140–400)
RBC: 4.61 10*6/uL (ref 3.80–5.10)
RDW: 13.1 % (ref 11.0–15.0)
WBC: 6 10*3/uL (ref 3.8–10.8)

## 2020-05-30 LAB — COMPLETE METABOLIC PANEL WITH GFR
AG Ratio: 1.6 (calc) (ref 1.0–2.5)
ALT: 25 U/L (ref 6–29)
AST: 21 U/L (ref 10–35)
Albumin: 4.4 g/dL (ref 3.6–5.1)
Alkaline phosphatase (APISO): 84 U/L (ref 31–125)
BUN: 13 mg/dL (ref 7–25)
CO2: 25 mmol/L (ref 20–32)
Calcium: 9.6 mg/dL (ref 8.6–10.2)
Chloride: 102 mmol/L (ref 98–110)
Creat: 0.79 mg/dL (ref 0.50–1.10)
GFR, Est African American: 105 mL/min/{1.73_m2} (ref >=60)
GFR, Est Non African American: 90 mL/min/{1.73_m2} (ref >=60)
Globulin: 2.7 g/dL (calc) (ref 1.9–3.7)
Glucose, Bld: 101 mg/dL — ABNORMAL HIGH (ref 65–99)
Potassium: 3.8 mmol/L (ref 3.5–5.3)
Sodium: 139 mmol/L (ref 135–146)
Total Bilirubin: 0.3 mg/dL (ref 0.2–1.2)
Total Protein: 7.1 g/dL (ref 6.1–8.1)

## 2020-05-30 LAB — LIPID PANEL
Cholesterol: 136 mg/dL (ref <200)
HDL: 58 mg/dL (ref >=50)
LDL Cholesterol (Calc): 62 mg/dL (calc)
Non-HDL Cholesterol (Calc): 78 mg/dL (calc) (ref <130)
Total CHOL/HDL Ratio: 2.3 (calc) (ref <5.0)
Triglycerides: 76 mg/dL (ref <150)

## 2020-05-30 LAB — TSH: TSH: 2.21 mIU/L

## 2020-05-30 LAB — VITAMIN D 25 HYDROXY (VIT D DEFICIENCY, FRACTURES): Vit D, 25-Hydroxy: 47 ng/mL (ref 30–100)

## 2020-05-31 ENCOUNTER — Encounter: Payer: Self-pay | Admitting: Family Medicine

## 2020-05-31 NOTE — Assessment & Plan Note (Addendum)
UnControlled,  change in medication, stop hCTZ , start triamterene DASH diet and commitment to daily physical activity for a minimum of 30 minutes discussed and encouraged, as a part of hypertension management. The importance of attaining a healthy weight is also discussed.  BP/Weight 05/27/2020 07/26/2019 06/14/2019 05/08/2019 01/22/2019 10/19/2018 7/61/8485  Systolic BP 927 639 432 003 794 446 190  Diastolic BP 97 84 84 82 80 74 80  Wt. (Lbs) 354 342 344 320 323 318 316.12  BMI 55.44 53.56 53.88 50.12 50.59 49.81 49.51

## 2020-05-31 NOTE — Assessment & Plan Note (Signed)
  Patient re-educated about  the importance of commitment to a  minimum of 150 minutes of exercise per week as able.  The importance of healthy food choices with portion control discussed, as well as eating regularly and within a 12 hour window most days. The need to choose "clean , green" food 50 to 75% of the time is discussed, as well as to make water the primary drink and set a goal of 64 ounces water daily.    Weight /BMI 05/27/2020 07/26/2019 06/14/2019  WEIGHT 354 lb 342 lb 344 lb  HEIGHT 5\' 7"  5\' 7"  5\' 7"   BMI 55.44 kg/m2 53.56 kg/m2 53.88 kg/m2

## 2020-05-31 NOTE — Assessment & Plan Note (Signed)
Uncontrolled , refer to Psychiatry

## 2020-06-04 ENCOUNTER — Telehealth (INDEPENDENT_AMBULATORY_CARE_PROVIDER_SITE_OTHER): Payer: Self-pay | Admitting: Licensed Clinical Social Worker

## 2020-06-04 DIAGNOSIS — F322 Major depressive disorder, single episode, severe without psychotic features: Secondary | ICD-10-CM

## 2020-06-04 DIAGNOSIS — F411 Generalized anxiety disorder: Secondary | ICD-10-CM

## 2020-06-04 NOTE — BH Specialist Note (Signed)
[place] Initial Clinical Assessment  MRN: [id number] NAME: Jennifer [name] Date: 06/04/20  Start time:  1150 End time:  1210 Total time:  20 min Call number:  [phone]  Type of Contact:  phone Patient consent obtained:  yes Reason for Visit today:  begin [facility] services  Treatment History Patient recently received Inpatient Treatment:  denies  Facility/Program:    Date of discharge:   Patient currently being seen by therapist/psychiatrist:   Patient currently receiving the following services:    Past Psychiatric History/Hospitalization(s): Anxiety: No Bipolar Disorder: No Depression: No Mania: No Psychosis: No Schizophrenia: No Personality Disorder: No Hospitalization for psychiatric illness: No History of Electroconvulsive Shock Therapy: No Prior Suicide Attempts: No  Clinical Assessment:  PHQ-9 Assessments: Depression screen [facility] 2/9 05/27/2020 06/14/2019 01/22/2019  Decreased Interest 3 3 2   Down, Depressed, Hopeless 2 3 1   PHQ - 2 Score 5 6 3   Altered sleeping 3 1 2   Tired, decreased energy 3 3 3   Change in appetite 3 3 0  Feeling bad or failure about yourself  3 3 0  Trouble concentrating 1 0 0  Moving slowly or fidgety/restless 0 2 1  Suicidal thoughts 0 0 0  PHQ-9 Score 18 18 9   Difficult doing work/chores Very difficult Very difficult Somewhat difficult  Some recent data might be hidden    GAD-7 Assessments: GAD 7 : Generalized Anxiety Score 06/04/2020 06/14/2019 12/11/2018 09/29/2018  Nervous, Anxious, on Edge 2 3 2 2   Control/stop worrying 2 2 2 1   Worry too much - different things 1 2 2 2   Trouble relaxing 2 3 1 1   Restless 2 1 1 1   Easily annoyed or irritable 2 2 0 1  Afraid - awful might happen 1 1 1 1   Total GAD 7 Score 12 14 9 9   Anxiety Difficulty Somewhat difficult Somewhat difficult Somewhat difficult Somewhat difficult     Social Functioning Social maturity:  WNL Social judgement:  WNL  Stress Current stressors:  isolation,  poor sleep pattern, ruminating thoughts Familial stressors:  denies Sleep:  does not sleep well; wakes around 3 am and is up for the day Appetite:  WNL Coping ability:  overwhelmed Patient taking medications as prescribed:  yes  Current medications:  Outpatient Encounter Medications as of 06/04/2020  Medication Sig  . acetaminophen (TYLENOL) 500 MG tablet Take 500 mg by mouth every 6 (six) hours as needed for mild pain or moderate pain.  . busPIRone (BUSPAR) 7.5 MG tablet TAKE 1 TABLET (7.5 MG TOTAL) BY MOUTH 3 (THREE) TIMES DAILY.  Marland Kitchen Cholecalciferol (VITAMIN D3) 1.25 MG (50000 UT) CAPS TAKE 1 CAPSULE BY MOUTH ONCE A WEEK  . clotrimazole-betamethasone (LOTRISONE) cream Apply 1 application topically 2 (two) times daily.  . cyclobenzaprine (FLEXERIL) 10 MG tablet Take 1 tablet (10 mg total) by mouth 3 (three) times daily as needed for muscle spasms.  . fluticasone (FLONASE) 50 MCG/ACT nasal spray Place 2 sprays into both nostrils daily.  . INCASSIA 0.35 MG tablet TAKE 1 TABLET BY MOUTH EVERY DAY  . montelukast (SINGULAIR) 10 MG tablet TAKE 1 TABLET BY MOUTH EVERYDAY AT BEDTIME  . orlistat (XENICAL) 120 MG capsule Take 1 capsule (120 mg total) by mouth 3 (three) times daily with meals.  . Probiotic Product (PROBIOTIC DAILY PO) Take by mouth. 1 tab daily  . triamterene-hydrochlorothiazide (MAXZIDE-25) 37.5-25 MG tablet Take 1 tablet by mouth daily.  Marland Kitchen venlafaxine XR (EFFEXOR XR) 150 MG 24 hr capsule Take 1 capsule (  150 mg total) by mouth daily with breakfast.   No facility-administered encounter medications on file as of 06/04/2020.    Self-harm Behaviors Risk Assessment Self-harm risk factors:   Patient endorses recent thoughts of harming self:    [place] Suicide Severity Rating Scale:  C-SRSS 08/15/2018 09/29/2018 12/11/2018 June 20, 2019  1. Wish to be Dead No No No No  2. Suicidal Thoughts No No No No  3. Suicidal Thoughts with Method Without Specific Plan or Intent to Act - - - No  4.  Suicidal Intent Without Specific Plan - - - No  5. Suicide Intent with Specific Plan - - - No  6. Suicide Behavior Question No No No No  7. How long ago did you do any of these? - - - Within the last three months    Danger to Others Risk Assessment Danger to others risk factors:  denies Patient endorses recent thoughts of harming others:    Dynamic Appraisal of Situational Aggression (DASA): No flowsheet data found.  Substance Use Assessment Patient recently consumed alcohol:    Alcohol Use Disorder Identification Test (AUDIT):  Alcohol Use Disorder Test (AUDIT) 08/15/2018 09/29/2018 12/11/2018  1. How often do you have a drink containing alcohol? 1 0 0  2. How many drinks containing alcohol do you have on a typical day when you are drinking? 0 0 0  3. How often do you have six or more drinks on one occasion? 0 0 0  AUDIT-C Score 1 0 0  Alcohol Brief Interventions/Follow-up AUDIT Score <7 follow-up not indicated AUDIT Score <7 follow-up not indicated AUDIT Score <7 follow-up not indicated   Patient recently used drugs:    Opioid Risk Assessment:  Patient is concerned about dependence or abuse of substances:    ASAM Multidimensional Assessment Summary:  Dimension 1:    Dimension 1 Rating:    Dimension 2:    Dimension 2 Rating:    Dimension 3:    Dimension 3 Rating:    Dimension 4:    Dimension 4 Rating:    Dimension 5:    Dimension 5 Rating:    Dimension 6:    Dimension 6 Rating:   ASAM's Severity Rating Score:   ASAM Recommended Level of Treatment:     Goals, Interventions and Follow-up Plan Goals: Increase healthy adjustment to current life circumstances Interventions: Brief CBT Follow-up Plan: continue with [facility] services weekly  Summary of Clinical Assessment Summary: Jennifer [name] is a 44 year old African American woman that was referred to [place] services by her Primary Care Physician. Jennifer [name] reports "I think about things that happened in my past.  I can't let it go.   It is effecting my life today."  Jennifer [name] denies any mental health treatment in the past but reports sadness, lack of motivation, poor energy for over a year.  She feels on edge, worried and irritable at least 3 times per week. She has no motivation for social outings.  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  She agrees to weekly [facility] calls.  Explored coping skills.   Lubertha South, LCSW

## 2020-06-04 NOTE — BH Specialist Note (Signed)
Virtual Behavioral Health Treatment Plan Team Note  MRN: [id number] NAME: Jennifer [name]  DATE: 06/04/20  Start time:   215End time:  225p Total time:  10 min  Total number of Virtual [place] Treatment Team Plan encounters: 1/4  Treatment Team Attendees: Royal Piedra.LCSW; Dr. Modesta Messing, Psychiatrist  Diagnoses:    ICD-10-CM   1. Depression, major, single episode, severe ([employer])  F32.2   2. GAD (generalized anxiety disorder)  F41.1     Goals, Interventions and Follow-up Plan Goals: Increase healthy adjustment to current life circumstances Interventions: Brief CBT Medication Management Recommendations: Continue Effexor and 150 mg, Buspar 7.5 three times daily. May consider Bupropion in the future to target binge eating like episode. Follow-up Plan: continue with [facility] services weekly, continue to provide psychoeducation for medication for non adherence to medication. Continue with behavioral activation.  History of the present illness Presenting Problem/Current Symptoms: sadness, isolation, crying spells, loss of interest, fatigue  Psychiatric History  Depression: Yes Anxiety: Yes Mania: No Psychosis: No PTSD symptoms: No  Past Psychiatric History/Hospitalization(s): Hospitalization for psychiatric illness: No Prior Suicide Attempts: No Prior Self-injurious behavior: No  Psychosocial stressors   Virtual BH Phone Follow Up from 12/11/2018 in [facility]  Current Stressors --  [Wants to obtain a new job,  Family support has moved out of state. ]  Familial Stressors None  Sleep Decreased, Difficulty falling asleep  Appetite Decreased, Loss of appetite  Coping ability Deficient support system, Exhausted, Overwhelmed  Patient taking medications as prescribed Yes      Self-harm Behaviors Risk Assessment   Virtual [place] Phone Follow Up from 12/11/2018 in [facility]  Self-harm risk factors --  [None Reported]  Have you recently had any thoughts about harming  yourself? No      Screenings PHQ-9 Assessments:  Depression screen [facility] 2/9 06/04/2020 05/27/2020 06/14/2019  Decreased Interest 2 3 3   Down, Depressed, Hopeless 2 2 3   PHQ - 2 Score 4 5 6   Altered sleeping 2 3 1   Tired, decreased energy 1 3 3   Change in appetite 1 3 3   Feeling bad or failure about yourself  2 3 3   Trouble concentrating 2 1 0  Moving slowly or fidgety/restless 1 0 2  Suicidal thoughts 0 0 0  PHQ-9 Score 13 18 18   Difficult doing work/chores Very difficult Very difficult Very difficult  Some recent data might be hidden   GAD-7 Assessments:  GAD 7 : Generalized Anxiety Score 06/04/2020 06/14/2019 12/11/2018 09/29/2018  Nervous, Anxious, on Edge 2 3 2 2   Control/stop worrying 2 2 2 1   Worry too much - different things 1 2 2 2   Trouble relaxing 2 3 1 1   Restless 2 1 1 1   Easily annoyed or irritable 2 2 0 1  Afraid - awful might happen 1 1 1 1   Total GAD 7 Score 12 14 9 9   Anxiety Difficulty Somewhat difficult Somewhat difficult Somewhat difficult Somewhat difficult    Past Medical History Past Medical History:  Diagnosis Date   Abnormal vaginal Pap smear    Alopecia    Depression    Diabetes mellitus without complication ([place])    Diverticulosis    Family history of diabetes mellitus    Hemorrhoids    History of recurrent UTIs    Obesity     Vital signs: There were no vitals filed for this visit.  Allergies:  Allergies as of 06/04/2020   (No Known Allergies)    Medication History Current medications:  Outpatient Encounter Medications as of 06/04/2020  Medication Sig   acetaminophen (TYLENOL) 500 MG tablet Take 500 mg by mouth every 6 (six) hours as needed for mild pain or moderate pain.   busPIRone (BUSPAR) 7.5 MG tablet TAKE 1 TABLET (7.5 MG TOTAL) BY MOUTH 3 (THREE) TIMES DAILY.   Cholecalciferol (VITAMIN D3) 1.25 MG (50000 UT) CAPS TAKE 1 CAPSULE BY MOUTH ONCE A WEEK   clotrimazole-betamethasone (LOTRISONE) cream Apply 1 application  topically 2 (two) times daily.   cyclobenzaprine (FLEXERIL) 10 MG tablet Take 1 tablet (10 mg total) by mouth 3 (three) times daily as needed for muscle spasms.   fluticasone (FLONASE) 50 MCG/ACT nasal spray Place 2 sprays into both nostrils daily.   INCASSIA 0.35 MG tablet TAKE 1 TABLET BY MOUTH EVERY DAY   montelukast (SINGULAIR) 10 MG tablet TAKE 1 TABLET BY MOUTH EVERYDAY AT BEDTIME   orlistat (XENICAL) 120 MG capsule Take 1 capsule (120 mg total) by mouth 3 (three) times daily with meals.   Probiotic Product (PROBIOTIC DAILY PO) Take by mouth. 1 tab daily   triamterene-hydrochlorothiazide (MAXZIDE-25) 37.5-25 MG tablet Take 1 tablet by mouth daily.   venlafaxine XR (EFFEXOR XR) 150 MG 24 hr capsule Take 1 capsule (150 mg total) by mouth daily with breakfast.   No facility-administered encounter medications on file as of 06/04/2020.     Scribe for Treatment Team: Lubertha South, LCSW

## 2020-06-04 NOTE — Progress Notes (Signed)
[facility] Health [facility] Psychiatric Consultant Case Review  Jennifer [name] is a 44 y.o. year old female with a history of depression, prediabetes, hypertension. She previously had [place] service for depression in the context of loss of her sister and her friend in 2019. 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 She is non adherence to venlafaxine and buspirone due to perceived limited effectiveness from medication.   Assessment/Provisional Diagnosis # MDD, recurrent  Continue current medication, while providing psycho education for non adherence. She may benefit from bupropion in the future given her concern of weight gain/prominent fatigue.  Recommendation - Continue venlafaxine 150 mg daily, Buspar 7.5 mg TID - BH specialist to target medication non adherence, coach behavioral activation.  - [place] specialist to explore/evaluate symptoms of sleep apnea    Thank you for your consult. We will continue to follow the patient. Please contact [facility]  for any questions or concerns.   The above treatment considerations and suggestions are based on consultation with the [facility] specialist and/or PCP and a review of information available in the shared registry and the patients [place] (EHR). I have not personally examined the patient. All recommendations should be implemented with consideration of the patient's relevant prior history and current clinical status. Please feel free to call me with any questions about the care of this patient.

## 2020-06-15 ENCOUNTER — Other Ambulatory Visit: Payer: Self-pay | Admitting: Family Medicine

## 2020-06-23 ENCOUNTER — Encounter: Payer: Self-pay | Admitting: Licensed Clinical Social Worker

## 2020-06-24 ENCOUNTER — Telehealth: Payer: Self-pay | Admitting: Licensed Clinical Social Worker

## 2020-06-24 NOTE — Telephone Encounter (Signed)
Left message encouraging contact 

## 2020-07-02 ENCOUNTER — Telehealth: Payer: Self-pay | Admitting: Licensed Clinical Social Worker

## 2020-07-02 NOTE — Telephone Encounter (Signed)
Left message encouraging contact 

## 2020-07-03 ENCOUNTER — Ambulatory Visit: Payer: Medicaid Other | Admitting: Family Medicine

## 2020-07-10 ENCOUNTER — Ambulatory Visit: Payer: Medicaid Other | Admitting: Urology

## 2020-07-14 ENCOUNTER — Encounter: Payer: Self-pay | Admitting: Family Medicine

## 2020-07-14 ENCOUNTER — Telehealth (INDEPENDENT_AMBULATORY_CARE_PROVIDER_SITE_OTHER): Payer: Self-pay | Admitting: Family Medicine

## 2020-07-14 ENCOUNTER — Telehealth: Payer: Self-pay

## 2020-07-14 ENCOUNTER — Other Ambulatory Visit: Payer: Self-pay

## 2020-07-14 VITALS — BP 143/97 | Ht 67.0 in | Wt 354.0 lb

## 2020-07-14 DIAGNOSIS — M25531 Pain in right wrist: Secondary | ICD-10-CM

## 2020-07-14 DIAGNOSIS — I10 Essential (primary) hypertension: Secondary | ICD-10-CM

## 2020-07-14 DIAGNOSIS — R2681 Unsteadiness on feet: Secondary | ICD-10-CM | POA: Insufficient documentation

## 2020-07-14 DIAGNOSIS — M25562 Pain in left knee: Secondary | ICD-10-CM

## 2020-07-14 NOTE — Telephone Encounter (Signed)
Called the pt to ask her to send me her Insurance card front & Back via Mychart.

## 2020-07-14 NOTE — Patient Instructions (Addendum)
Annual physical exam in office with MD in 3 to 6 weeks, call if you need me sooner  You are referred urgently to Orthopedics.( [facility] [place] office)  You are referred to Dr Merlene Laughter re unsteady gait  Think about what you will eat, plan ahead. Choose " clean, green, fresh or frozen" over canned, processed or packaged foods which are more sugary, salty and fatty. 70 to 75% of food eaten should be vegetables and fruit. Three meals at set times with snacks allowed between meals, but they must be fruit or vegetables. Aim to eat over a 12 hour period , example 7 am to 7 pm, and STOP after  your last meal of the day. Drink water,generally about 64 ounces per day, no other drink is as healthy. Fruit juice is best enjoyed in a healthy way, by EATING the fruit.

## 2020-07-14 NOTE — Progress Notes (Signed)
Virtual Visit via Telephone Note  I connected with Jennifer [name] on 07/14/20 at 10:00 AM EDT by telephone and verified that I am speaking with the correct person using two identifiers.  Location: Patient: work Provider: office   I discussed the limitations, risks, security and privacy concerns of performing an evaluation and management service by telephone and the availability of in person appointments. I also discussed with the patient that there may be a patient responsible charge related to this service. The patient expressed understanding and agreed to proceed.   History of Present Illness: [name] on 07/13/2020 at a family's home while walking down steps foot slipped , fell on buttock, landed sideways on left leg, feels popping in left knee since fall,, also tried o break the fasll with the right wrist which is painful and swollen, knee is swollen . Pain rated at 9 C/o imbalance and new jerking of the eye which she relates to increased stress  Observations/Objective: BP (!) 143/97   Ht 5\' 7"  (1.702 m)   Wt (!) 354 lb (160.6 kg)   BMI 55.44 kg/m  Good communication with no confusion and intact memory. Alert and oriented x 3 No signs of respiratory distress during speech    Assessment and Plan: Unsteady gait Reports chronic unsteady gait and a feeling of imbalance also eye jumping involuntarily, refer Neurology for evaluation  Acute pain of left knee Pain rated at a 9 s/p fall, refer to Ortho  Right wrist pain Right wrist pain s/p fall, refer Ortho  Essential hypertension Elevated and uncopntrolled ,needs in office eval DASH diet and commitment to daily physical activity for a minimum of 30 minutes discussed and encouraged, as a part of hypertension management. The importance of attaining a healthy weight is also discussed.  BP/Weight 07/14/2020 05/27/2020 07/26/2019 06/14/2019 05/08/2019 01/22/2019 86/03/7845  Systolic BP 962 952 841 324 401 027 253  Diastolic BP 97 97 84 84 82 80  74  Wt. (Lbs) 354 354 342 344 320 323 318  BMI 55.44 55.44 53.56 53.88 50.12 50.59 49.81       Morbid obesity (HCC)  Patient re-educated about  the importance of commitment to a  minimum of 150 minutes of exercise per week as able.  The importance of healthy food choices with portion control discussed, as well as eating regularly and within a 12 hour window most days. The need to choose "clean , green" food 50 to 75% of the time is discussed, as well as to make water the primary drink and set a goal of 64 ounces water daily.    Weight /BMI 07/14/2020 05/27/2020 07/26/2019  WEIGHT 354 lb 354 lb 342 lb  HEIGHT 5\' 7"  5\' 7"  5\' 7"   BMI 55.44 kg/m2 55.44 kg/m2 53.56 kg/m2        Follow Up Instructions:    I discussed the assessment and treatment plan with the patient. The patient was provided an opportunity to ask questions and all were answered. The patient agreed with the plan and demonstrated an understanding of the instructions.   The patient was advised to call back or seek an in-person evaluation if the symptoms worsen or if the condition fails to improve as anticipated.  I provided 20 minutes of non-face-to-face time during this encounter.   Tula Nakayama, MD

## 2020-07-15 NOTE — Telephone Encounter (Signed)
Called pt to reminder her to send the insurance card-front and back via mychart.  I advised that I would call her back in the next couple of days if I havent received it

## 2020-07-18 ENCOUNTER — Other Ambulatory Visit: Payer: Self-pay

## 2020-07-18 ENCOUNTER — Ambulatory Visit
Admission: RE | Admit: 2020-07-18 | Discharge: 2020-07-18 | Disposition: A | Discharge status: Home / Self Care | Payer: Medicaid Other | Source: Ambulatory Visit | Attending: Family Medicine | Admitting: Family Medicine

## 2020-07-18 ENCOUNTER — Ambulatory Visit (INDEPENDENT_AMBULATORY_CARE_PROVIDER_SITE_OTHER): Payer: 59

## 2020-07-18 DIAGNOSIS — Z23 Encounter for immunization: Secondary | ICD-10-CM | POA: Diagnosis not present

## 2020-07-18 IMAGING — MG DIGITAL SCREENING BILAT W/ TOMO W/ CAD
8 of 14 series · 8 of 40 positions shown · non-contrast
Comparison: Previous exam(s).

ACR Breast Density Category a: The breast tissue is almost entirely
fatty.

CLINICAL DATA: Screening.

EXAM:
DIGITAL SCREENING BILATERAL MAMMOGRAM WITH TOMO AND CAD

[R CC synth-2D]
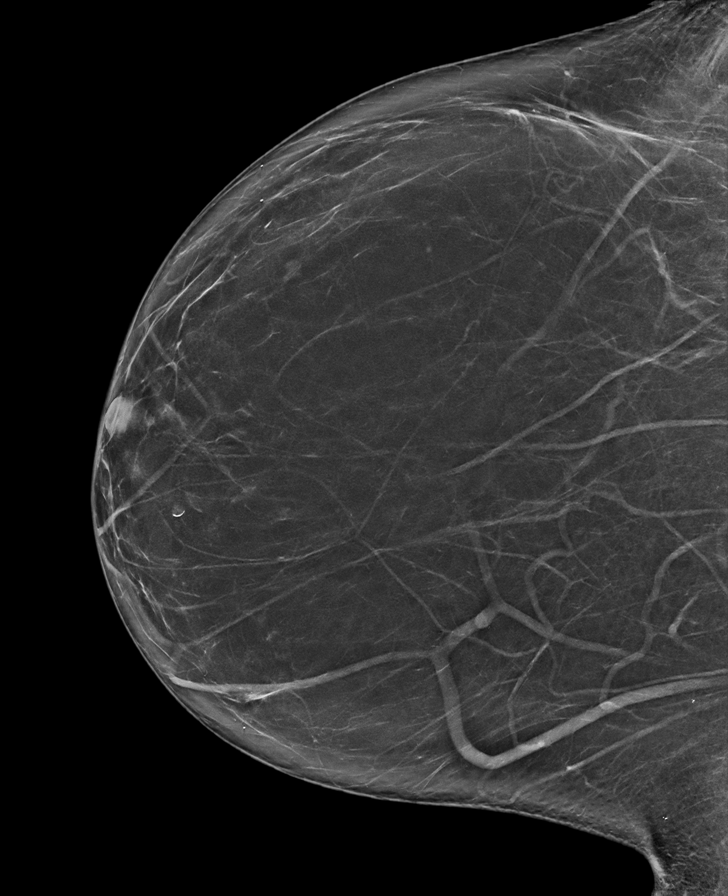

[L MLO synth-2D (1 of 2)]
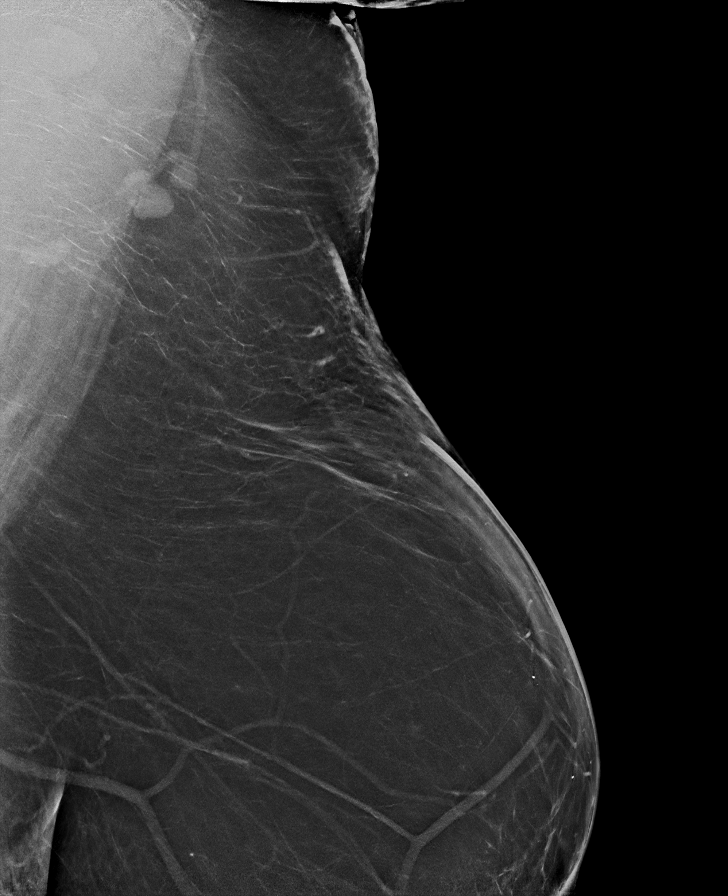

[L CC synth-2D]
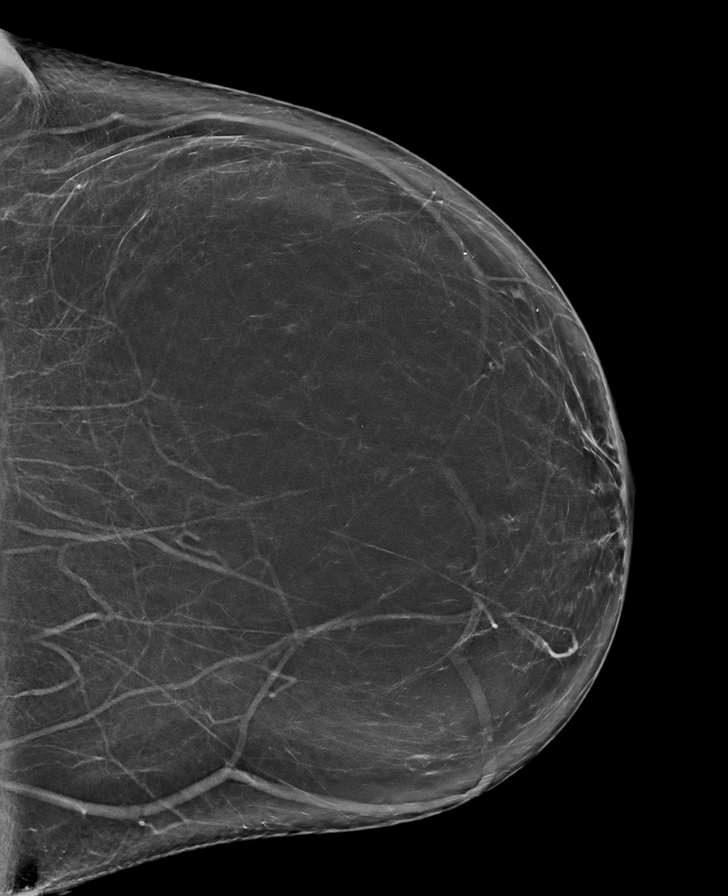

[R MLO synth-2D (1 of 2)]
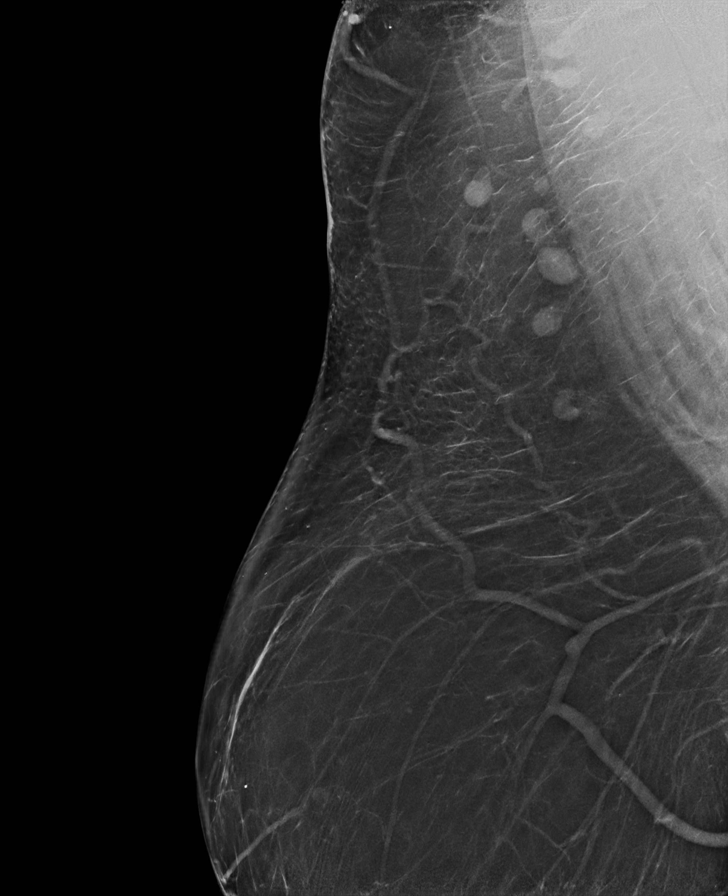

[L MLO synth-2D (2 of 2)]
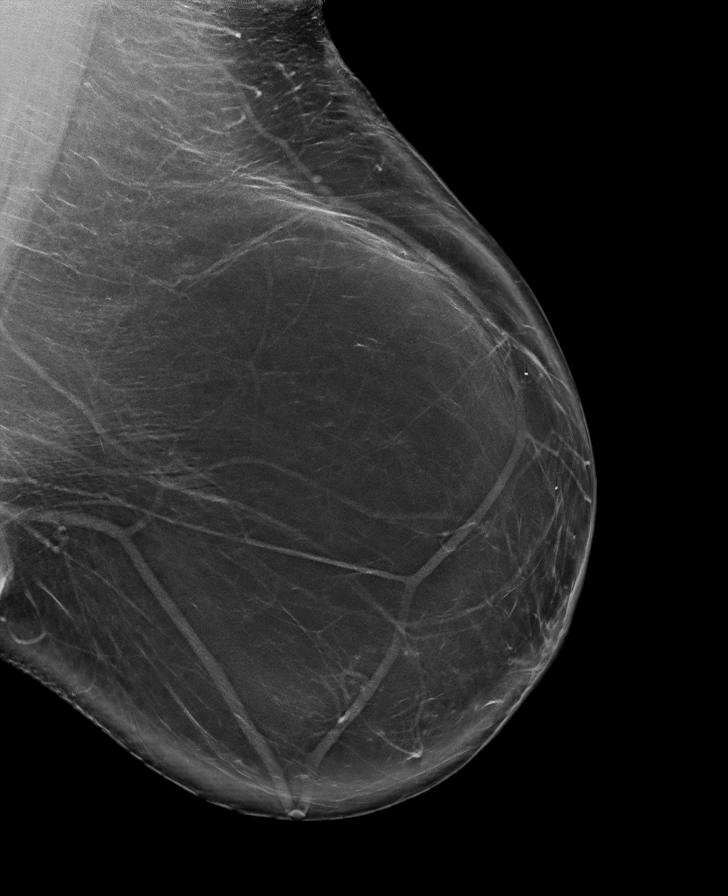

[R XCCL synth-2D]
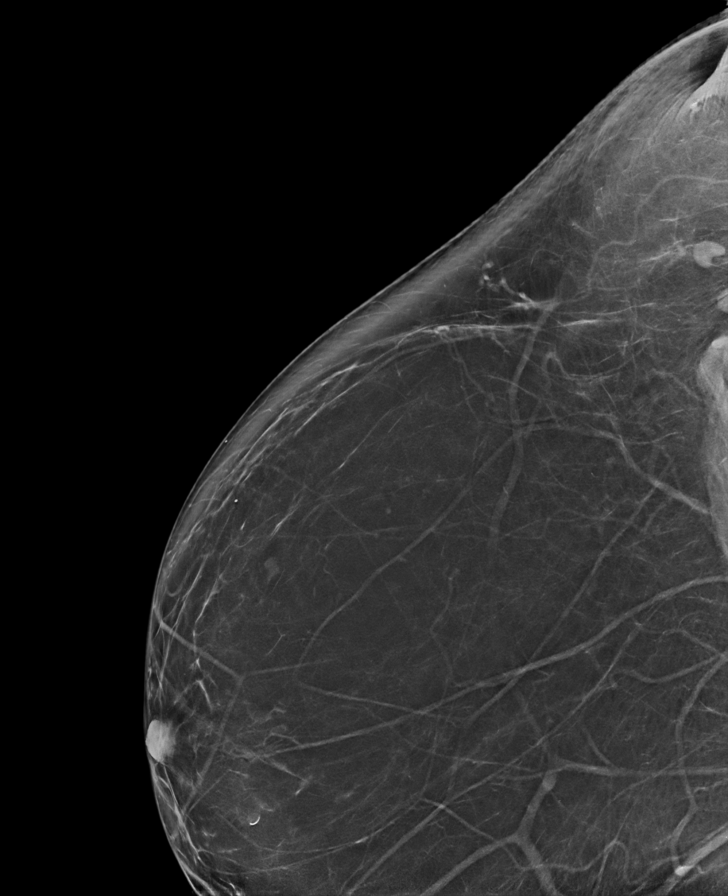

[R MLO synth-2D (2 of 2)]
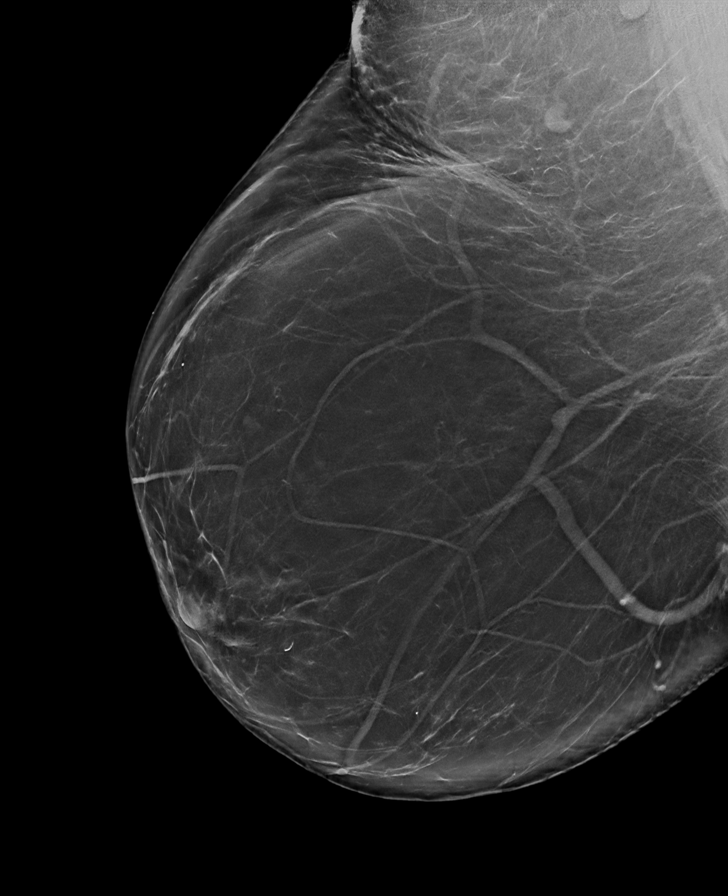

[R XCCL tomo · tomo slice 46/91.0]
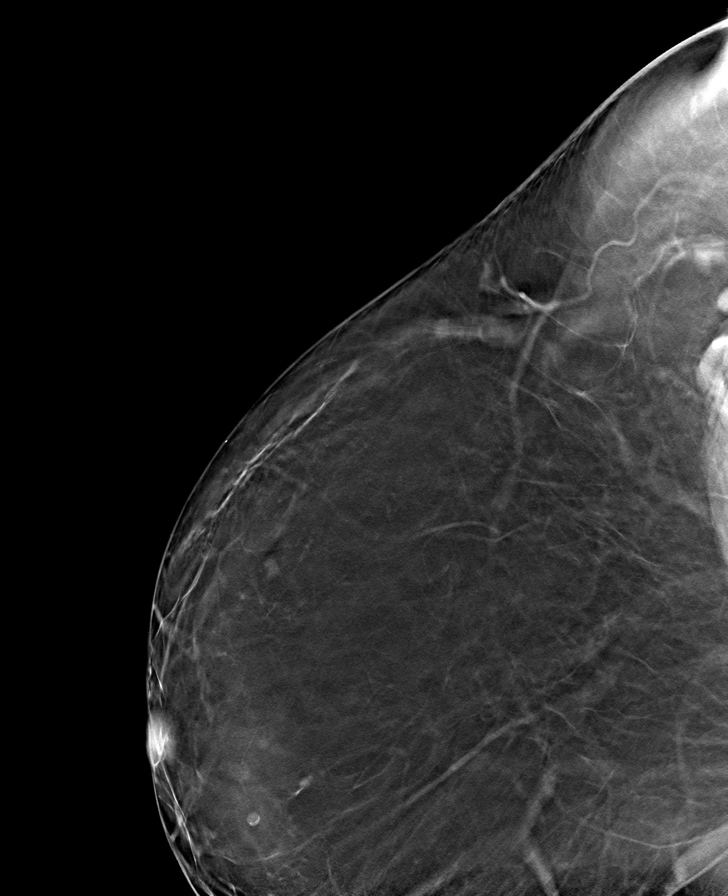

[8 of 40 positions shown; findings below may reference images not displayed]

FINDINGS: There are no findings suspicious for malignancy. Images were
processed with CAD.
IMPRESSION: No mammographic evidence of malignancy. A result letter of this
screening mammogram will be mailed directly to the patient.

RECOMMENDATION:
Screening mammogram in one year. (Code:[TA])

BI-RADS CATEGORY  1: Negative.

## 2020-07-21 ENCOUNTER — Encounter: Payer: Self-pay | Admitting: Family Medicine

## 2020-07-21 DIAGNOSIS — M25531 Pain in right wrist: Secondary | ICD-10-CM | POA: Insufficient documentation

## 2020-07-21 DIAGNOSIS — M25562 Pain in left knee: Secondary | ICD-10-CM | POA: Insufficient documentation

## 2020-07-21 NOTE — Assessment & Plan Note (Signed)
Right wrist pain s/p fall, refer Ortho

## 2020-07-21 NOTE — Assessment & Plan Note (Signed)
  Patient re-educated about  the importance of commitment to a  minimum of 150 minutes of exercise per week as able.  The importance of healthy food choices with portion control discussed, as well as eating regularly and within a 12 hour window most days. The need to choose "clean , green" food 50 to 75% of the time is discussed, as well as to make water the primary drink and set a goal of 64 ounces water daily.    Weight /BMI 07/14/2020 05/27/2020 07/26/2019  WEIGHT 354 lb 354 lb 342 lb  HEIGHT 5\' 7"  5\' 7"  5\' 7"   BMI 55.44 kg/m2 55.44 kg/m2 53.56 kg/m2

## 2020-07-21 NOTE — Assessment & Plan Note (Signed)
Pain rated at a 9 s/p fall, refer to Ortho

## 2020-07-21 NOTE — Assessment & Plan Note (Signed)
Reports chronic unsteady gait and a feeling of imbalance also eye jumping involuntarily, refer Neurology for evaluation

## 2020-07-21 NOTE — Assessment & Plan Note (Signed)
Elevated and uncopntrolled ,needs in office eval DASH diet and commitment to daily physical activity for a minimum of 30 minutes discussed and encouraged, as a part of hypertension management. The importance of attaining a healthy weight is also discussed.  BP/Weight 07/14/2020 05/27/2020 07/26/2019 06/14/2019 05/08/2019 01/22/2019 88/03/2073  Systolic BP 097 964 [phone] 660  Diastolic BP 97 97 84 84 82 80 74  Wt. (Lbs) 354 354 342 344 320 323 318  BMI 55.44 55.44 53.56 53.88 50.12 50.59 49.81

## 2020-07-23 ENCOUNTER — Telehealth: Payer: Self-pay | Admitting: Licensed Clinical Social Worker

## 2020-07-23 NOTE — Telephone Encounter (Signed)
Left message encouraging contact 

## 2020-07-24 NOTE — Telephone Encounter (Signed)
Sent another note via Pharmacist, community

## 2020-08-14 ENCOUNTER — Ambulatory Visit (INDEPENDENT_AMBULATORY_CARE_PROVIDER_SITE_OTHER): Payer: 59 | Admitting: Family Medicine

## 2020-08-14 ENCOUNTER — Other Ambulatory Visit: Payer: Self-pay

## 2020-08-14 VITALS — BP 131/84 | HR 93 | Temp 98.0°F | Ht 67.0 in | Wt 351.0 lb

## 2020-08-14 DIAGNOSIS — R350 Frequency of micturition: Secondary | ICD-10-CM

## 2020-08-14 DIAGNOSIS — Z0000 Encounter for general adult medical examination without abnormal findings: Secondary | ICD-10-CM

## 2020-08-14 DIAGNOSIS — M25562 Pain in left knee: Secondary | ICD-10-CM

## 2020-08-14 DIAGNOSIS — F418 Other specified anxiety disorders: Secondary | ICD-10-CM | POA: Diagnosis not present

## 2020-08-14 DIAGNOSIS — F322 Major depressive disorder, single episode, severe without psychotic features: Secondary | ICD-10-CM

## 2020-08-14 LAB — POCT URINALYSIS DIP (CLINITEK)
Bilirubin, UA: NEGATIVE
Glucose, UA: NEGATIVE mg/dL
Ketones, POC UA: NEGATIVE mg/dL
Nitrite, UA: NEGATIVE
POC PROTEIN,UA: 30 — AB
Spec Grav, UA: 1.025 (ref 1.010–1.025)
Urobilinogen, UA: 1 E.U./dL
pH, UA: 7 (ref 5.0–8.0)

## 2020-08-14 MED ORDER — HYDROXYZINE HCL 50 MG PO TABS: ORAL_TABLET | ORAL | 3 refills | Status: DC

## 2020-08-14 MED ORDER — PHENTERMINE HCL 37.5 MG PO TABS: ORAL_TABLET | ORAL | 3 refills | Status: DC

## 2020-08-14 NOTE — Progress Notes (Signed)
Jennifer [name]     MRN: [id number]      DOB: 10/14/1976  HPI: Patient is in for annual physical exam.currently on menses , will return for pap C/o increased urgency  And frequency of urination x 1 week. C/o weight gain wants to resume medication to curb appetite Recent labs, if available are reviewed. Immunization is reviewed , and  updated if needed.   PE: BP 131/84   Pulse 93   Temp 98 F (36.7 C)   Ht 5\' 7"  (1.702 m)   Wt (!) 351 lb (159.2 kg)   SpO2 96%   BMI 54.97 kg/m   Pleasant  female, alert and oriented x 3, in no cardio-pulmonary distress. Afebrile. HEENT No facial trauma or asymetry. Sinuses non tender.  Extra occullar muscles intact.. External ears normal, . Neck: supple, no adenopathy,JVD or thyromegaly.No bruits.  Chest: Clear to ascultation bilaterally.No crackles or wheezes. Non tender to palpation  Breast: Normal mammogram 07/18/2020, no exam in office  Cardiovascular system; Heart sounds normal,  S1 and  S2 ,no S3.  No murmur, or thrill. Apical beat not displaced Peripheral pulses normal.  Abdomen: Soft, non tender, No guarding, tenderness or rebound.   GU: deferred.   MS: decreased  ROM of spine, hips , and knees. deformity ,swelling and  crepitus noted. No muscle wasting or atrophy.   Neurologic: Cranial nerves 2 to 12 intact. Power, tone ,sensation and reflexes normal throughout. No disturbance in gait. No tremor.  Skin: Intact, no ulceration, erythema , scaling or rash noted. Pigmentation normal throughout  Psych; Mildly depressed mood and tearful at times.. Judgement and concentration normal   Assessment & Plan:  Annual physical exam Annual exam as documented. Counseling done  re healthy lifestyle involving commitment to 150 minutes exercise per week, heart healthy diet, and attaining healthy weight.The importance of adequate sleep also discussed. Regular seat belt use and home safety, is also discussed. Changes in health  habits are decided on by the patient with goals and time frames  set for achieving them. Immunization and cancer screening needs are specifically addressed at this visit.   Morbid obesity (HCC)  Patient re-educated about  the importance of commitment to a  minimum of 150 minutes of exercise per week as able.  The importance of healthy food choices with portion control discussed, as well as eating regularly and within a 12 hour window most days. The need to choose "clean , green" food 50 to 75% of the time is discussed, as well as to make water the primary drink and set a goal of 64 ounces water daily.    Weight /BMI 08/14/2020 07/14/2020 05/27/2020  WEIGHT 351 lb 354 lb 354 lb  HEIGHT 5\' 7"  5\' 7"  5\' 7"   BMI 54.97 kg/m2 55.44 kg/m2 55.44 kg/m2  Start half phentermine daily    Urinary frequency 1 week history, abnormal UA , send for c/s then treat if needed  Acute pain of left knee Improved with injection now has right pain, will return to Ortho  Depression, major, single episode, severe (HCC) Mild flare just getting over recent breakup, in therapy and needs to re connect, will send message

## 2020-08-14 NOTE — Patient Instructions (Signed)
F/u for pap October 14, call if you need me sooner.Glycohb at that visit  URINE CHECKED TODAY FOR INFECTION  NEW IS PHENTERMINE TABLET HALF DAILY TO HELP WITH WEIGHT LOSS  New is hydroxyzine at bedtime to help with sleep  Think about what you will eat, plan ahead. Choose " clean, green, fresh or frozen" over canned, processed or packaged foods which are more sugary, salty and fatty. 70 to 75% of food eaten should be vegetables and fruit. Three meals at set times with snacks allowed between meals, but they must be fruit or vegetables. Aim to eat over a 12 hour period , example 7 am to 7 pm, and STOP after  your last meal of the day. Drink water,generally about 64 ounces per day, no other drink is as healthy. Fruit juice is best enjoyed in a healthy way, by EATING the fruit.  Thanks for choosing [facility], we consider it a privelige to serve you.

## 2020-08-15 ENCOUNTER — Encounter: Payer: Self-pay | Admitting: Family Medicine

## 2020-08-15 ENCOUNTER — Other Ambulatory Visit (HOSPITAL_COMMUNITY)
Admission: RE | Admit: 2020-08-15 | Discharge: 2020-08-15 | Disposition: A | Discharge status: Home / Self Care | Payer: 59 | Source: Other Acute Inpatient Hospital | Attending: Family Medicine | Admitting: Family Medicine

## 2020-08-15 DIAGNOSIS — R350 Frequency of micturition: Secondary | ICD-10-CM | POA: Diagnosis present

## 2020-08-15 NOTE — Assessment & Plan Note (Signed)

## 2020-08-15 NOTE — Assessment & Plan Note (Signed)
Improved with injection now has right pain, will return to Ortho

## 2020-08-15 NOTE — Assessment & Plan Note (Signed)
Mild flare just getting over recent breakup, in therapy and needs to re connect, will send message

## 2020-08-15 NOTE — Assessment & Plan Note (Signed)
  Patient re-educated about  the importance of commitment to a  minimum of 150 minutes of exercise per week as able.  The importance of healthy food choices with portion control discussed, as well as eating regularly and within a 12 hour window most days. The need to choose "clean , green" food 50 to 75% of the time is discussed, as well as to make water the primary drink and set a goal of 64 ounces water daily.    Weight /BMI 08/14/2020 07/14/2020 05/27/2020  WEIGHT 351 lb 354 lb 354 lb  HEIGHT 5\' 7"  5\' 7"  5\' 7"   BMI 54.97 kg/m2 55.44 kg/m2 55.44 kg/m2  Start half phentermine daily

## 2020-08-15 NOTE — Assessment & Plan Note (Signed)
1 week history, abnormal UA , send for c/s then treat if needed

## 2020-08-16 LAB — URINE CULTURE: Culture: 10000 — AB

## 2020-08-19 ENCOUNTER — Telehealth: Payer: Self-pay | Admitting: *Deleted

## 2020-08-19 ENCOUNTER — Encounter: Payer: Self-pay | Admitting: Family Medicine

## 2020-08-19 ENCOUNTER — Other Ambulatory Visit: Payer: Self-pay | Admitting: Family Medicine

## 2020-08-19 MED ORDER — AMPICILLIN 500 MG PO CAPS: 500.0000 mg | ORAL_CAPSULE | Freq: Three times a day (TID) | ORAL | 0 refills | Status: DC

## 2020-08-19 MED ORDER — FLUCONAZOLE 150 MG PO TABS: 150.0000 mg | ORAL_TABLET | Freq: Once | ORAL | 0 refills | Status: AC

## 2020-08-19 NOTE — Telephone Encounter (Signed)
Pt notified with verbal understanding  °

## 2020-08-19 NOTE — Telephone Encounter (Signed)
She did have an " insignificant " as far as UTI diagnosis is concerned, She does have small amount of  bacteria commonly found on the skin , in her urine Since she is  still symptomatic, 5 day course of ampicillin followed by 1 fluconazole for possible vaginal yeast infection after antibiotic is prescribed,  please let her know

## 2020-08-19 NOTE — Progress Notes (Signed)
Ampicillin 500

## 2020-08-19 NOTE — Telephone Encounter (Signed)
Called pt sent dr simpson a message regarding symptoms she is still having

## 2020-08-19 NOTE — Telephone Encounter (Signed)
Pt was calling her culture showed no UTI however she is still having some blood and pressure after she urinates wanted to know what she could do about this?

## 2020-08-27 ENCOUNTER — Other Ambulatory Visit: Payer: Self-pay

## 2020-08-27 ENCOUNTER — Encounter: Payer: Self-pay | Admitting: Family Medicine

## 2020-08-27 ENCOUNTER — Other Ambulatory Visit (HOSPITAL_COMMUNITY)
Admission: RE | Admit: 2020-08-27 | Discharge: 2020-08-27 | Disposition: A | Discharge status: Home / Self Care | Payer: 59 | Source: Ambulatory Visit | Attending: Family Medicine | Admitting: Family Medicine

## 2020-08-27 ENCOUNTER — Ambulatory Visit (INDEPENDENT_AMBULATORY_CARE_PROVIDER_SITE_OTHER): Payer: 59 | Admitting: Family Medicine

## 2020-08-27 VITALS — BP 128/89 | HR 100 | Temp 97.8°F | Resp 20 | Ht 67.0 in | Wt 343.0 lb

## 2020-08-27 DIAGNOSIS — Z01419 Encounter for gynecological examination (general) (routine) without abnormal findings: Secondary | ICD-10-CM

## 2020-08-27 DIAGNOSIS — I10 Essential (primary) hypertension: Secondary | ICD-10-CM

## 2020-08-27 DIAGNOSIS — Z124 Encounter for screening for malignant neoplasm of cervix: Secondary | ICD-10-CM | POA: Insufficient documentation

## 2020-08-27 DIAGNOSIS — G8929 Other chronic pain: Secondary | ICD-10-CM

## 2020-08-27 DIAGNOSIS — R1030 Lower abdominal pain, unspecified: Secondary | ICD-10-CM | POA: Diagnosis not present

## 2020-08-27 DIAGNOSIS — N760 Acute vaginitis: Secondary | ICD-10-CM

## 2020-08-27 DIAGNOSIS — R3912 Poor urinary stream: Secondary | ICD-10-CM

## 2020-08-27 DIAGNOSIS — R197 Diarrhea, unspecified: Secondary | ICD-10-CM

## 2020-08-27 DIAGNOSIS — R1084 Generalized abdominal pain: Secondary | ICD-10-CM | POA: Diagnosis not present

## 2020-08-27 MED ORDER — DIPHENOXYLATE-ATROPINE 2.5-0.025 MG PO TABS: 1.0000 | ORAL_TABLET | Freq: Four times a day (QID) | ORAL | 0 refills | Status: DC | PRN

## 2020-08-27 NOTE — Patient Instructions (Addendum)
F/U in office with MD in January, call if you need me sooner  Pap sent  HBA1C, chem 7 and eGFR today  Congrats on weight loss, keep it up!   We will contact you re referral to Urology since July  You will be referred to your GI Doctor  Please submit stool to lab for testing for bacterial infection since you report uncontrolled watery diarrheah  For diarrhea , I have prescribed lomotil  Think about what you will eat, plan ahead. Choose " clean, green, fresh or frozen" over canned, processed or packaged foods which are more sugary, salty and fatty. 70 to 75% of food eaten should be vegetables and fruit. Three meals at set times with snacks allowed between meals, but they must be fruit or vegetables. Aim to eat over a 12 hour period , example 7 am to 7 pm, and STOP after  your last meal of the day. Drink water,generally about 64 ounces per day, no other drink is as healthy. Fruit juice is best enjoyed in a healthy way, by EATING the fruit.

## 2020-08-28 ENCOUNTER — Other Ambulatory Visit: Payer: Self-pay | Admitting: Family Medicine

## 2020-08-28 LAB — CYTOLOGY - PAP
Comment: NEGATIVE
Diagnosis: NEGATIVE
High risk HPV: NEGATIVE

## 2020-08-28 MED ORDER — METRONIDAZOLE 500 MG PO TABS: 500.0000 mg | ORAL_TABLET | Freq: Two times a day (BID) | ORAL | 0 refills | Status: DC

## 2020-08-30 LAB — NUSWAB BV AND CANDIDA, NAA
Candida albicans, NAA: NEGATIVE
Candida glabrata, NAA: NEGATIVE
Megasphaera 1: HIGH Score — AB

## 2020-08-31 ENCOUNTER — Telehealth: Payer: Self-pay | Admitting: Family Medicine

## 2020-08-31 ENCOUNTER — Encounter: Payer: Self-pay | Admitting: Family Medicine

## 2020-08-31 DIAGNOSIS — Z01419 Encounter for gynecological examination (general) (routine) without abnormal findings: Secondary | ICD-10-CM | POA: Insufficient documentation

## 2020-08-31 DIAGNOSIS — R109 Unspecified abdominal pain: Secondary | ICD-10-CM | POA: Insufficient documentation

## 2020-08-31 MED ORDER — PHENTERMINE HCL 37.5 MG PO TABS: ORAL_TABLET | ORAL | 3 refills | Status: DC

## 2020-08-31 NOTE — Assessment & Plan Note (Signed)
Exam as documented, pap sent, also specimens for STD testing

## 2020-08-31 NOTE — Assessment & Plan Note (Signed)
Several week h/o generalized abdominal pain, more so in lower abdomen, recent h/o watery diarrhea. Needs CT abdomen and pelvis, stool to be tested for C diff and bacterial infection and refer to GI

## 2020-08-31 NOTE — Progress Notes (Signed)
Jennifer [name]     MRN: [id number]      DOB: Jul 23, 1976   HPI Jennifer [name] is here for pelvic exam with pap. She c/o watery diarrhea  For approximately 1 week and also lower abdominal pain for weeks, she does have diverticulosis. Denies fever or chills, denies blood or mucus in stool Reports change in diet with good weight loss C/o poor urinary stream still awaiting Urology evaluation  ROS Denies recent fever or chills. Denies sinus pressure, nasal congestion, ear pain or sore throat. Denies chest congestion, productive cough or wheezing. Denies chest pains, palpitations and leg swelling .    Denies joint pain, swelling and limitation in mobility. Denies headaches, seizures, numbness, or tingling. C/o chronic depression, anxiety or insomnia. Denies skin break down or rash.   PE  BP 128/89   Pulse 100   Temp 97.8 F (36.6 C)   Resp 20   Ht 5\' 7"  (1.702 m)   Wt (!) 343 lb (155.6 kg)   SpO2 95%   BMI 53.72 kg/m   Patient alert and oriented and in no cardiopulmonary distress.  HEENT: No facial asymmetry, EOMI,     Neck supple .  Chest: Clear to auscultation bilaterally.  CVS: S1, S2 no murmurs, no S3.Regular rate.  ABD: Soft diffuse superficial tenderness, no guarding or rebound, due to body habitus unable to determine if there are masses or organomegaly. Most tender in lower abdomen  Pelvic: normal external female genitalia, no ulcers or adenopathy Internal: Vaginal walls pink and moist. No cervical motion or adnexal tenderness, cervix appears healthy, no ulcers noted Unable to access organomegaly due to body habitus.   Ext: No edema  MS: Adequate ROM spine, shoulders, hips and knees.  Skin: Intact, no ulcerations or rash noted.  Psych: Good eye contact, normal affect. Memory intact not anxious or depressed appearing.  CNS: CN 2-12 intact, power,  normal throughout.no focal deficits noted.   Assessment & Plan  Pap test, as part of routine gynecological  examination Exam as documented, pap sent, also specimens for STD testing  Poor urinary stream 3 monht h/o poor urinary stream and sifficultty urinating, referrall to Urology to be followed up and appt made  Abdominal pain Several week h/o generalized abdominal pain, more so in lower abdomen, recent h/o watery diarrhea. Needs CT abdomen and pelvis, stool to be tested for C diff and bacterial infection and refer to GI

## 2020-08-31 NOTE — Assessment & Plan Note (Signed)
3 monht h/o poor urinary stream and sifficultty urinating, referrall to Urology to be followed up and appt made

## 2020-08-31 NOTE — Addendum Note (Signed)
Addended by: [name] on: 08/31/2020 07:26 AM   Modules accepted: Level of Service

## 2020-08-31 NOTE — Telephone Encounter (Signed)
Pls let pt know that she still has not submitted stool for culture despite c/o diarrhea, also ad on c/s request for th stool sample please, I ha d initially only asked for C diff Also please le t her know that a CT scan  Abdomen and pelvis is requested for chronic pain  She needs to f/u on referral to Urology and GI. Thanks  ?? Please ask

## 2020-09-01 ENCOUNTER — Other Ambulatory Visit: Payer: Self-pay

## 2020-09-01 DIAGNOSIS — R197 Diarrhea, unspecified: Secondary | ICD-10-CM

## 2020-09-01 NOTE — Telephone Encounter (Signed)
Order for stool c/s added. Pt informed.

## 2020-09-01 NOTE — Telephone Encounter (Signed)
Pt informed and verbalizes understanding, states she still plans to obtain the stool cx's. States the Flagyl is helping the other sx's so far. States she will f/u with Urology and GI.

## 2020-09-01 NOTE — Telephone Encounter (Signed)
Thanks Caryl Pina, can you enter an additional order for stool c/S please

## 2020-09-02 ENCOUNTER — Telehealth: Payer: Self-pay

## 2020-09-02 NOTE — Telephone Encounter (Signed)
Patient lvm wanting a return call. Didn't leave a reason for call.

## 2020-09-02 NOTE — Telephone Encounter (Signed)
Spoke with patient. She was wondering if there was something "wrong with her blood". Advised patient no, nothing was wrong. Only active lab was a stool culture. She stated lab called and needed her to come and have a lab redrawn. Advised her there may have been something wrong with a tube or something. She verbalized understanding.

## 2020-09-03 LAB — BASIC METABOLIC PANEL WITH GFR
BUN: 10 mg/dL (ref 7–25)
CO2: 28 mmol/L (ref 20–32)
Calcium: 9.3 mg/dL (ref 8.6–10.2)
Chloride: 101 mmol/L (ref 98–110)
Creat: 0.88 mg/dL (ref 0.50–1.10)
GFR, Est African American: 91 mL/min/{1.73_m2} (ref >=60)
GFR, Est Non African American: 79 mL/min/{1.73_m2} (ref >=60)
Glucose, Bld: 87 mg/dL (ref 65–99)
Potassium: 4.5 mmol/L (ref 3.5–5.3)
Sodium: 138 mmol/L (ref 135–146)

## 2020-09-05 ENCOUNTER — Other Ambulatory Visit: Payer: Self-pay | Admitting: Family Medicine

## 2020-09-05 DIAGNOSIS — E559 Vitamin D deficiency, unspecified: Secondary | ICD-10-CM

## 2020-09-05 LAB — C. DIFFICILE GDH AND TOXIN A/B
GDH ANTIGEN: NOT DETECTED
MICRO NUMBER:: 11103488
SPECIMEN QUALITY:: ADEQUATE
TOXIN A AND B: NOT DETECTED

## 2020-09-05 LAB — HEMOGLOBIN A1C W/OUT EAG: Hgb A1c MFr Bld: 6.4 % of total Hgb — ABNORMAL HIGH (ref <5.7)

## 2020-09-09 ENCOUNTER — Encounter: Payer: Self-pay | Admitting: Nurse Practitioner

## 2020-09-09 ENCOUNTER — Other Ambulatory Visit: Payer: Self-pay | Admitting: Family Medicine

## 2020-09-09 ENCOUNTER — Other Ambulatory Visit: Payer: Self-pay

## 2020-09-09 ENCOUNTER — Ambulatory Visit (INDEPENDENT_AMBULATORY_CARE_PROVIDER_SITE_OTHER): Payer: 59 | Admitting: Nurse Practitioner

## 2020-09-09 VITALS — BP 145/95 | HR 98 | Temp 97.1°F | Ht 67.0 in | Wt 349.0 lb

## 2020-09-09 DIAGNOSIS — K921 Melena: Secondary | ICD-10-CM | POA: Diagnosis not present

## 2020-09-09 DIAGNOSIS — R1030 Lower abdominal pain, unspecified: Secondary | ICD-10-CM | POA: Diagnosis not present

## 2020-09-09 DIAGNOSIS — R197 Diarrhea, unspecified: Secondary | ICD-10-CM | POA: Diagnosis not present

## 2020-09-09 MED ORDER — DICYCLOMINE HCL 10 MG PO CAPS: 10.0000 mg | ORAL_CAPSULE | Freq: Two times a day (BID) | ORAL | 2 refills | Status: DC | PRN

## 2020-09-09 NOTE — Progress Notes (Signed)
Cc'ed to pcp °

## 2020-09-09 NOTE — Patient Instructions (Signed)
Your health issues we discussed today were:   Lower abdominal pain with (now resolved) diarrhea: 1. I sent a medication called Bentyl 10 mg to your pharmacy.  You can take this up to twice a day, if needed for abdominal cramping 2. Let us know if your cramping gets any worse 3. Let us know if your diarrhea returns 4. If you become constipated, stop taking Bentyl and call our office 5. Call us for any worsening or severe symptoms  Blood in your stool: 1. As we discussed, given your age it is most prudent to evaluate the blood in your stools with a colonoscopy 2. We will help schedule your colonoscopy for you 3. Further recommendations will follow your colonoscopy 4. Let us know if you have any worsening or severe bleeding  Overall I recommend:  1. Continue your other current medications 2. Return for follow-up in 3 months 3. Call us for any questions or concerns   At [facility] we value your feedback. You may receive a survey about your visit today. Please share your experience as we strive to create trusting relationships with our patients to provide genuine, compassionate, quality care.  We appreciate your understanding and patience as we review any laboratory studies, imaging, and other diagnostic tests that are ordered as we care for you. Our office policy is 5 business days for review of these results, and any emergent or urgent results are addressed in a timely manner for your best interest. If you do not hear from our office in 1 week, please contact us.   We also encourage the use of MyChart, which contains your medical information for your review as well. If you are not enrolled in this feature, an access code is on this after visit summary for your convenience. Thank you for allowing [place] to be involved in your care.  It was great to see you today!  I hope you have a great Fall!!

## 2020-09-09 NOTE — Progress Notes (Signed)
Primary Care Physician:  [name], MD Primary Gastroenterologist:  Dr. Gala Romney  Chief Complaint  Patient presents with   Abdominal Pain    LLQ when she has a BM at times   Diarrhea    last episode 2 weeks ago    HPI:   Jennifer [name] is a 44 y.o. female who presents on referral from primary care for abdominal pain.  The patient was last seen in our office 03/26/2015 for loose stools, hemorrhoids, diverticulosis.  Last colonoscopy in 2015 with grade 3 hemorrhoids, scattered left-sided transverse diverticula, remainder of colonic mucosa normal other than a single diminutive polyp the base the cecum found to be lymphoid.  Recommended hemorrhoid banding in 2 to 3 weeks.  At her last visit noted right upper quadrant pain months prior, unable to have a bowel movement.  Primary care placed her on clear liquid diet which helped and subsequently she resulted in diarrhea, although she is on Metformin and this occurred around the time she started Metformin.  Single episode of hematochezia with hemorrhoids and discomfort with bowel movements.  Abdominal pain typically resolves with defecation.  Drink significant water daily, significant fiber daily.  Trying to improve the quality of her diet.  No other overt GI complaints.  Recommended abdominal x-ray, follow-up with hemorrhoid banding in office, follow-up in 3 months.  Abdominal x-ray completed 03/26/2015 which found no acute findings.  The patient was a no-show to her follow-up visit.  The patient was last seen by primary care 08/27/2020 for routine exam.  At that time complained of watery diarrhea for 1 week and lower abdominal pain.  Has had some weight loss intentionally with change in diet.  Noted diffuse superficial tenderness on exam, worse in the lower abdomen.  Recommended CT of the abdomen, stool for C. difficile, referral to GI.  Stool for C. difficile completed 09/04/2020 which was negative.  CT of the abdomen and pelvis scheduled  for 09/25/2020.  Today she states doing okay overall. Today she states she has LLQ and lower abdominal pain, described as crampy, and improves with a bowel movement. Was having loose stools, this stopped about 2 weeks ago. Pain unchanged even with change in stools. Tylenol helped. Pain usually improves after a bowel movement. Typically stools are Bristol 4, occasional straining. No prolonged or severe straining. She doesn't take anything for her bowel movements. Has a bowel movement daily. She will get more constipated if she eats a lot of cheese. Milk causes Bristol 6-7 stools. She saw a little hematochezia when she was getting over the diarrhea. Denies melena, N/V, fever, chills. Thinks she lost weight unintentionally when she had diarrhea.   Was on Lomotil for diarrhea, hasn't taken in a while. Has never been on Bentyl.  Past Medical History:  Diagnosis Date   Abnormal vaginal Pap smear    Alopecia    Depression    Diabetes mellitus without complication ([place])    Diverticulosis    Family history of diabetes mellitus    Hemorrhoids    History of recurrent UTIs    Obesity     Past Surgical History:  Procedure Laterality Date   APPENDECTOMY     COLONOSCOPY N/A 03/13/2014   Dr. Gala Romney- grade 3 hemorrhoids, colonic diverticulosis bx= benign lymphoid polyp   KNEE ARTHROSCOPY     right    Current Outpatient Medications  Medication Sig Dispense Refill   acetaminophen (TYLENOL) 500 MG tablet Take 500 mg by mouth every 6 (six) hours  as needed for mild pain or moderate pain.     busPIRone (BUSPAR) 7.5 MG tablet TAKE 1 TABLET (7.5 MG TOTAL) BY MOUTH 3 (THREE) TIMES DAILY. 270 tablet 2   Cholecalciferol (VITAMIN D3) 1.25 MG (50000 UT) CAPS TAKE 1 CAPSULE BY MOUTH ONCE A WEEK 12 capsule 2   clotrimazole-betamethasone (LOTRISONE) cream Apply 1 application topically 2 (two) times daily. 45 g 1   cyclobenzaprine (FLEXERIL) 10 MG tablet Take 1 tablet (10 mg total) by mouth 3 (three)  times daily as needed for muscle spasms. 21 tablet 0   diphenoxylate-atropine (LOMOTIL) 2.5-0.025 MG tablet Take 1 tablet by mouth 4 (four) times daily as needed for diarrhea or loose stools. 30 tablet 0   fluticasone (FLONASE) 50 MCG/ACT nasal spray Place 2 sprays into both nostrils daily. 16 g 6   INCASSIA 0.35 MG tablet TAKE 1 TABLET BY MOUTH EVERY DAY 84 tablet 2   montelukast (SINGULAIR) 10 MG tablet TAKE 1 TABLET BY MOUTH EVERYDAY AT BEDTIME 90 tablet 1   phentermine (ADIPEX-P) 37.5 MG tablet Take half tablet by mouth once daily every morning at breakfast 15 tablet 3   [START ON 09/13/2020] phentermine (ADIPEX-P) 37.5 MG tablet Take half tablet by mouth once daily with breakfast 15 tablet 3   triamterene-hydrochlorothiazide (MAXZIDE-25) 37.5-25 MG tablet Take 1 tablet by mouth daily. 90 tablet 3   venlafaxine XR (EFFEXOR-XR) 150 MG 24 hr capsule TAKE 1 CAPSULE (150 MG TOTAL) BY MOUTH DAILY WITH BREAKFAST. 30 capsule 5   dicyclomine (BENTYL) 10 MG capsule Take 1 capsule (10 mg total) by mouth 2 (two) times daily as needed for spasms. 60 capsule 2   hydrOXYzine (ATARAX/VISTARIL) 50 MG tablet TAKE ONE TABLET AT BEDTIME FOR SLEEP 90 tablet 0   No current facility-administered medications for this visit.    Allergies as of 09/09/2020   (No Known Allergies)    Family History  Adopted: Yes  Problem Relation Age of Onset   Hypertension Mother    Diabetes Mother    Other Mother        cholangiocarcinoma, age 77, deceased   Colon cancer Neg Hx     Social History   Socioeconomic History   Marital status: Single    Spouse name: Not on file   Number of children: 4   Years of education: Not on file   Highest education level: Not on file  Occupational History   Occupation: UNEMPLOYWED SINCE 10/2010  Tobacco Use   Smoking status: Never Smoker   Smokeless tobacco: Never Used  Substance and Sexual Activity   Alcohol use: No   Drug use: No   Sexual activity:  Not Currently    Birth control/protection: I.U.D., Condom  Other Topics Concern   Not on file  Social History Narrative   Not on file   Social Determinants of Health   Financial Resource Strain:    Difficulty of Paying Living Expenses: Not on file  Food Insecurity:    Worried About Charity fundraiser in the Last Year: Not on file   [employer] of Food in the Last Year: Not on file  Transportation Needs:    Lack of Transportation (Medical): Not on file   Lack of Transportation (Non-Medical): Not on file  Physical Activity:    Days of Exercise per Week: Not on file   Minutes of Exercise per Session: Not on file  Stress:    Feeling of Stress : Not on file  Social Connections:  Frequency of Communication with Friends and Family: Not on file   Frequency of Social Gatherings with Friends and Family: Not on file   Attends Religious Services: Not on file   Active Member of Clubs or Organizations: Not on file   Attends Archivist Meetings: Not on file   Marital Status: Not on file  Intimate Partner Violence:    Fear of Current or Ex-Partner: Not on file   Emotionally Abused: Not on file   Physically Abused: Not on file   Sexually Abused: Not on file    Subjective: Review of Systems  Constitutional: Negative for chills, fever, malaise/fatigue and weight loss.  HENT: Negative for congestion and sore throat.   Respiratory: Negative for cough and shortness of breath.   Cardiovascular: Negative for chest pain and palpitations.  Gastrointestinal: Positive for abdominal pain and diarrhea (now resolved). Negative for blood in stool, melena, nausea and vomiting.  Musculoskeletal: Negative for joint pain and myalgias.  Skin: Negative for rash.  Neurological: Negative for dizziness and weakness.  Endo/Heme/Allergies: Does not bruise/bleed easily.  Psychiatric/Behavioral: Negative for depression. The patient is not nervous/anxious.   All other systems reviewed  and are negative.      Objective: BP (!) 145/95    Pulse 98    Temp (!) 97.1 F (36.2 C)    Ht _0  (1.702 m)    Wt (!) 349 lb (158.3 kg)    LMP 08/04/2020    BMI 54.66 kg/m  Physical Exam Vitals and nursing note reviewed.  Constitutional:      General: She is not in acute distress.    Appearance: Normal appearance. She is well-developed. She is obese. She is not ill-appearing, toxic-appearing or diaphoretic.  HENT:     Head: Normocephalic and atraumatic.     Nose: No congestion or rhinorrhea.  Eyes:     General: No scleral icterus. Cardiovascular:     Rate and Rhythm: Normal rate and regular rhythm.     Heart sounds: Normal heart sounds.  Pulmonary:     Effort: Pulmonary effort is normal. No respiratory distress.     Breath sounds: Normal breath sounds.  Abdominal:     General: Bowel sounds are normal.     Palpations: Abdomen is soft. There is no hepatomegaly, splenomegaly or mass.     Tenderness: There is no abdominal tenderness. There is no guarding or rebound.     Hernia: No hernia is present.  Skin:    General: Skin is warm and dry.     Coloration: Skin is not jaundiced.     Findings: No rash.  Neurological:     General: No focal deficit present.     Mental Status: She is alert and oriented to person, place, and time.  Psychiatric:        Attention and Perception: Attention normal.        Mood and Affect: Mood normal.        Speech: Speech normal.        Behavior: Behavior normal.        Thought Content: Thought content normal.        Cognition and Memory: Cognition and memory normal.      Assessment:  Very pleasant 44 year old female who presents for abdominal pain, diarrhea, hematochezia.  Diarrhea lasted about 2 weeks and has since resolved.  She does still has some lower abdominal cramping.  Pain is located left lower quadrant.  Pain typically resolves after a bowel movement.  She did not notice a change in her pain when she was having loose stools.  She did  note some hematochezia when she was having diarrhea for 2 weeks.  Abdominal pain and diarrhea: Given her description presentation sounds likely irritable bowel syndrome.  However, cannot rule out more insidious pathology.  She does have a CT of the abdomen and pelvis pending for early November.  We will plan for colonoscopy based on her rectal bleeding, as per below which will allow evaluation for other etiologies.  In the meantime we will start her on Bentyl to help with abdominal cramping with appropriate precautions  Hematochezia: Noted hematochezia while she was having diarrhea.  Likely benign anorectal source from persistent diarrhea.  However, cannot rule out other etiologies such as bleeding polyps or malignancies.  Previous colonoscopy in 2015 (6 years ago) to have grade 3 hemorrhoids.  This is likely the source of her bleeding.  However, we will proceed with colonoscopy at this time to evaluate for any other concerning causes.  Proceed with colonoscopy on propofol/MAC by Dr. Gala Romney in near future: the risks, benefits, and alternatives have been discussed with the patient in detail. The patient states understanding and desires to proceed.  Patient is currently on BuSpar, Flexeril, Effexor. The patient is not on any other anticoagulants, anxiolytics, chronic pain medications, antidepressants, antidiabetics, or iron supplements.  Additionally, her BMI is approximately 54.  We will plan for the procedure on propofol/MAC to promote adequate sedation.  ASA III   Plan: 1. Colonoscopy as per above 2. Bentyl 10 mg twice daily as needed 3. Call for any worsening symptoms 4. Stop Bentyl and notify us if you develop constipation 5. Further recommendations will follow 6. Follow-up in 3 months    Thank you for allowing [place] to participate in the care of [place], DNP, AGNP-C Adult & Gerontological Nurse Practitioner [facility]   09/09/2020 4:07  PM   Disclaimer: This note was dictated with voice recognition software. Similar sounding words can inadvertently be transcribed and may not be corrected upon review.

## 2020-09-16 ENCOUNTER — Telehealth: Payer: Self-pay | Admitting: *Deleted

## 2020-09-16 NOTE — Telephone Encounter (Signed)
Called pt to r/s 11/24/20 procedure as RMR will be on PAL. The only date available currently was 2/14 and this did not work for patient. Aware will call with March schedule

## 2020-09-19 ENCOUNTER — Other Ambulatory Visit: Payer: Self-pay | Admitting: Family Medicine

## 2020-09-23 ENCOUNTER — Telehealth: Payer: Self-pay

## 2020-09-23 ENCOUNTER — Other Ambulatory Visit: Payer: Self-pay

## 2020-09-23 ENCOUNTER — Encounter: Payer: Self-pay | Admitting: Family Medicine

## 2020-09-23 DIAGNOSIS — E559 Vitamin D deficiency, unspecified: Secondary | ICD-10-CM

## 2020-09-23 MED ORDER — VITAMIN D3 1.25 MG (50000 UT) PO CAPS: ORAL_CAPSULE | ORAL | 1 refills | Status: DC

## 2020-09-23 MED ORDER — BUSPIRONE HCL 7.5 MG PO TABS
7.5000 mg | ORAL_TABLET | Freq: Three times a day (TID) | ORAL | 2 refills | Status: DC
Start: 2020-09-23 — End: 2021-04-02

## 2020-09-23 MED ORDER — NORETHINDRONE 0.35 MG PO TABS
1.0000 | ORAL_TABLET | Freq: Every day | ORAL | 1 refills | Status: DC
Start: 2020-09-23 — End: 2020-12-03

## 2020-09-23 NOTE — Telephone Encounter (Signed)
Please call melanie with Pre Cert --pt needs an Auth to have a CT completed

## 2020-09-23 NOTE — Telephone Encounter (Signed)
Left vm with melanie that CT was approved #[id number] and to call back with any concerns

## 2020-09-24 ENCOUNTER — Telehealth: Payer: Self-pay

## 2020-09-24 NOTE — Telephone Encounter (Signed)
Pre-op appt 09/15/21. Appt letter mailed with procedure instructions. 

## 2020-09-24 NOTE — Telephone Encounter (Signed)
Called pt, TCS w/Prop w/Dr. Gala Romney ASA 3 rescheduled to 12/02/20 at 11:00am. She tested positive for covid 09/19/20. Had covid test done at a site in [place] and was called with result. Advised her to contact testing site to get documentation she was positive since no retest is done within 90 days. She asked I put reminder in her packet with procedure. Letter sent with procedure instructions. Informed endo scheduler she was positive for covid. Procedure orders entered.

## 2020-09-25 ENCOUNTER — Other Ambulatory Visit: Payer: Self-pay

## 2020-09-25 ENCOUNTER — Ambulatory Visit (HOSPITAL_COMMUNITY): Admission: RE | Admit: 2020-09-25 | Payer: 59 | Source: Ambulatory Visit

## 2020-10-16 ENCOUNTER — Encounter: Payer: Self-pay | Admitting: Nurse Practitioner

## 2020-10-16 ENCOUNTER — Ambulatory Visit: Payer: Medicaid Other | Admitting: Urology

## 2020-11-21 ENCOUNTER — Other Ambulatory Visit (HOSPITAL_COMMUNITY): Payer: 59

## 2020-11-24 ENCOUNTER — Ambulatory Visit (HOSPITAL_COMMUNITY): Admit: 2020-11-24 | Payer: 59 | Admitting: Internal Medicine

## 2020-11-24 ENCOUNTER — Encounter (HOSPITAL_COMMUNITY): Payer: Self-pay

## 2020-11-24 SURGERY — COLONOSCOPY WITH PROPOFOL
Anesthesia: Monitor Anesthesia Care

## 2020-11-24 NOTE — Patient Instructions (Signed)
Your procedure is scheduled on: 12/02/2020  Report to [facility] at   9:15  AM.  Call this number if you have problems the morning of surgery: [phone]   Remember:              Follow Directions on the letter you received from Your Physician's office regarding the Bowel Prep              No Smoking the day of Procedure :   Take these medicines the morning of surgery with A SIP OF WATER: Buspar, Flonase, and Effexor              Hold Phenteramine until after surgery              No diabetic medication am of surgery   Do not wear jewelry, make-up or nail polish.    Do not bring valuables to the hospital.  Contacts, dentures or bridgework may not be worn into surgery.  .   Patients discharged the day of surgery will not be allowed to drive home.     Colonoscopy, Adult, Care After This sheet gives you information about how to care for yourself after your procedure. Your health care provider may also give you more specific instructions. If you have problems or questions, contact your health care provider. What can I expect after the procedure? After the procedure, it is common to have:  A small amount of blood in your stool for 24 hours after the procedure.  Some gas.  Mild abdominal cramping or bloating.  Follow these instructions at home: General instructions   For the first 24 hours after the procedure: ? Do not drive or use machinery. ? Do not sign important documents. ? Do not drink alcohol. ? Do your regular daily activities at a slower pace than normal. ? Eat soft, easy-to-digest foods. ? Rest often.  Take over-the-counter or prescription medicines only as told by your health care provider.  It is up to you to get the results of your procedure. Ask your health care provider, or the department performing the procedure, when your results will be ready. Relieving cramping and bloating  Try walking around when you have cramps or feel bloated.  Apply heat to  your abdomen as told by your health care provider. Use a heat source that your health care provider recommends, such as a moist heat pack or a heating pad. ? Place a towel between your skin and the heat source. ? Leave the heat on for 20-30 minutes. ? Remove the heat if your skin turns bright red. This is especially important if you are unable to feel pain, heat, or cold. You may have a greater risk of getting burned. Eating and drinking  Drink enough fluid to keep your urine clear or pale yellow.  Resume your normal diet as instructed by your health care provider. Avoid heavy or fried foods that are hard to digest.  Avoid drinking alcohol for as long as instructed by your health care provider. Contact a health care provider if:  You have blood in your stool 2-3 days after the procedure. Get help right away if:  You have more than a small spotting of blood in your stool.  You pass large blood clots in your stool.  Your abdomen is swollen.  You have nausea or vomiting.  You have a fever.  You have increasing abdominal pain that is not relieved with medicine. This information is not intended to replace  advice given to you by your health care provider. Make sure you discuss any questions you have with your health care provider. Document Released: 06/15/2004 Document Revised: 07/26/2016 Document Reviewed: 01/13/2016 Elsevier Interactive Patient Education  [employer].

## 2020-11-25 ENCOUNTER — Encounter (HOSPITAL_COMMUNITY): Payer: Self-pay

## 2020-11-25 ENCOUNTER — Encounter (HOSPITAL_COMMUNITY)
Admission: RE | Admit: 2020-11-25 | Discharge: 2020-11-25 | Disposition: A | Discharge status: Home / Self Care | Payer: 59 | Source: Ambulatory Visit | Attending: Internal Medicine | Admitting: Internal Medicine

## 2020-11-25 ENCOUNTER — Ambulatory Visit: Payer: Medicaid Other | Admitting: Urology

## 2020-11-25 ENCOUNTER — Other Ambulatory Visit: Payer: Self-pay

## 2020-11-25 DIAGNOSIS — Z01812 Encounter for preprocedural laboratory examination: Secondary | ICD-10-CM | POA: Insufficient documentation

## 2020-11-25 DIAGNOSIS — R3912 Poor urinary stream: Secondary | ICD-10-CM

## 2020-11-25 HISTORY — DX: Essential (primary) hypertension: I10

## 2020-11-25 HISTORY — DX: Gastro-esophageal reflux disease without esophagitis: K21.9

## 2020-11-25 HISTORY — DX: Unspecified osteoarthritis, unspecified site: M19.90

## 2020-11-25 LAB — BASIC METABOLIC PANEL
Anion gap: 11 (ref 5–15)
BUN: 19 mg/dL (ref 6–20)
CO2: 24 mmol/L (ref 22–32)
Calcium: 9.3 mg/dL (ref 8.9–10.3)
Chloride: 103 mmol/L (ref 98–111)
Creatinine, Ser: 0.88 mg/dL (ref 0.44–1.00)
GFR, Estimated: >60 mL/min (ref >60)
Glucose, Bld: 100 mg/dL — ABNORMAL HIGH (ref 70–99)
Potassium: 3.7 mmol/L (ref 3.5–5.1)
Sodium: 138 mmol/L (ref 135–145)

## 2020-11-25 LAB — HCG, SERUM, QUALITATIVE: Preg, Serum: NEGATIVE

## 2020-11-25 NOTE — Progress Notes (Signed)
   11/25/20 0909  OBSTRUCTIVE SLEEP APNEA  Have you ever been diagnosed with sleep apnea through a sleep study? No  Do you snore loudly (loud enough to be heard through closed doors)?  1  Do you often feel tired, fatigued, or sleepy during the daytime (such as falling asleep during driving or talking to someone)? 1  Has anyone observed you stop breathing during your sleep? 0  Do you have, or are you being treated for high blood pressure? 1  BMI more than 35 kg/m2? 1  Age > 50 (1-yes) 0  Neck circumference greater than:Female 16 inches or larger, Female 17inches or larger? 1  Female Gender (Yes=1) 0  Obstructive Sleep Apnea Score 5  Score 5 or greater  Results sent to PCP

## 2020-12-01 ENCOUNTER — Encounter (HOSPITAL_COMMUNITY): Payer: Self-pay | Admitting: Anesthesiology

## 2020-12-02 ENCOUNTER — Ambulatory Visit (HOSPITAL_COMMUNITY): Admission: RE | Admit: 2020-12-02 | Payer: 59 | Source: Home / Self Care | Admitting: Internal Medicine

## 2020-12-02 ENCOUNTER — Encounter (HOSPITAL_COMMUNITY): Admission: RE | Payer: Self-pay | Source: Home / Self Care

## 2020-12-02 SURGERY — COLONOSCOPY WITH PROPOFOL
Anesthesia: Monitor Anesthesia Care

## 2020-12-02 NOTE — OR Nursing (Signed)
Called patient to see about coming in earlier for her procedure. Pt states she called and left a message about cancelling her procedure today due to her driver not feeling comfortable driving due to the weather.

## 2020-12-03 ENCOUNTER — Telehealth (INDEPENDENT_AMBULATORY_CARE_PROVIDER_SITE_OTHER): Payer: 59 | Admitting: Family Medicine

## 2020-12-03 ENCOUNTER — Other Ambulatory Visit: Payer: Self-pay

## 2020-12-03 DIAGNOSIS — F418 Other specified anxiety disorders: Secondary | ICD-10-CM

## 2020-12-03 DIAGNOSIS — R7303 Prediabetes: Secondary | ICD-10-CM

## 2020-12-03 DIAGNOSIS — N760 Acute vaginitis: Secondary | ICD-10-CM

## 2020-12-03 DIAGNOSIS — F322 Major depressive disorder, single episode, severe without psychotic features: Secondary | ICD-10-CM

## 2020-12-03 DIAGNOSIS — M5440 Lumbago with sciatica, unspecified side: Secondary | ICD-10-CM

## 2020-12-03 DIAGNOSIS — R7301 Impaired fasting glucose: Secondary | ICD-10-CM

## 2020-12-03 MED ORDER — METRONIDAZOLE 500 MG PO TABS: 500.0000 mg | ORAL_TABLET | Freq: Two times a day (BID) | ORAL | 0 refills | Status: DC

## 2020-12-03 MED ORDER — FLUCONAZOLE 150 MG PO TABS: 150.0000 mg | ORAL_TABLET | Freq: Once | ORAL | 0 refills | Status: AC

## 2020-12-03 MED ORDER — NORETHINDRONE 0.35 MG PO TABS: 1.0000 | ORAL_TABLET | Freq: Every day | ORAL | 1 refills | Status: DC

## 2020-12-03 MED ORDER — VENLAFAXINE HCL ER 150 MG PO CP24: 150.0000 mg | ORAL_CAPSULE | Freq: Every day | ORAL | 5 refills | Status: DC

## 2020-12-03 MED ORDER — PHENTERMINE HCL 37.5 MG PO TABS: ORAL_TABLET | ORAL | 1 refills | Status: DC

## 2020-12-03 NOTE — Progress Notes (Unsigned)
Virtual Visit via Telephone Note  I connected with Jennifer [name] on 12/03/20 at 11:00 AM EST by telephone and verified that I am speaking with the correct person using two identifiers.  Location: Patient: home Provider:work   I discussed the limitations, risks, security and privacy concerns of performing an evaluation and management service by telephone and the availability of in person appointments. I also discussed with the patient that there may be a patient responsible charge related to this service. The patient expressed understanding and agreed to proceed.   History of Present Illness: [name] at home about 2 weeks ago, approximately 11/25/2020, unable to specifically recall, while getting out of  the shower, hurts when up and movoing around, chiropractor addressing this, had LBP radiating down right leg, chiropractor improving and uses tylenol  fishy odor x 1 week, wants treatment and has h/o recurrent BV Doing well with phentermine for weight loss and monitoring food intake more as well as committed to regular exercise Denies recent fever or chills. Denies sinus pressure, nasal congestion, ear pain or sore throat. Denies chest congestion, productive cough or wheezing. Denies chest pains, palpitations and leg swelling Denies abdominal pain, nausea, vomiting,diarrhea or constipation.   Denies dysuria, frequency, hesitancy or incontinence.  Denies headaches, seizures, numbness, or tingling. Denies uncontrolled  depression, anxiety or insomnia. Denies skin break down or rash.     Observations/Objective: There were no vitals taken for this visit. Good communication with no confusion and intact memory. Alert and oriented x 3 No signs of respiratory distress during speech    Assessment and Plan: Vulvovaginitis Recurrent BV, treated empirically with metronidazole, call back for persistent symptoms, advised on inquiry boric acid suppositories,  may be used for 30 days only,  cautioned against ingestion  Prediabetes Patient educated about the importance of limiting  Carbohydrate intake , the need to commit to daily physical activity for a minimum of 30 minutes , and to commit weight loss. The fact that changes in all these areas will reduce or eliminate all together the development of diabetes is stressed.   Diabetic Labs Latest Ref Rng & Units 11/25/2020 09/04/2020 09/02/2020 05/29/2020 05/27/2020  HbA1c <5.7 % of total Hgb - 6.4(H) - - 6.1  Chol <200 mg/dL - - - 136 -  HDL > OR = 50 mg/dL - - - 58 -  Calc LDL mg/dL (calc) - - - 62 -  Triglycerides <150 mg/dL - - - 76 -  Creatinine 0.44 - 1.00 mg/dL 0.88 - 0.88 0.79 -   BP/Weight 11/25/2020 09/09/2020 08/27/2020 08/14/2020 07/14/2020 05/27/2020 03/16/7740  Systolic BP 287 867 672 094 709 628 366  Diastolic BP 88 95 89 84 97 97 84  Wt. (Lbs) 340 349 343 351 354 354 342  BMI 53.25 54.66 53.72 54.97 55.44 55.44 53.56   No flowsheet data found.   Updated lab needed at/ before next visit.     Morbid obesity ([place])  Patient re-educated about  the importance of commitment to a  minimum of 150 minutes of exercise per week as able.  The importance of healthy food choices with portion control discussed, as well as eating regularly and within a 12 hour window most days. The need to choose "clean , green" food 50 to 75% of the time is discussed, as well as to make water the primary drink and set a goal of 64 ounces water daily.    Weight /BMI 11/25/2020 09/09/2020 08/27/2020  WEIGHT 340 lb 349 lb 343 lb  HEIGHT 5\' 7"  5\' 7"  5\' 7"   BMI 53.25 kg/m2 54.66 kg/m2 53.72 kg/m2   Reports weight loss, continue phentermine as before and re eval in 4 months with 10 poud weight loss target, minimum   Back pain Increased due to recent fall, approx 11/25/2020, being treated by chiropractor successfully reportedly  Depression, major, single episode, severe ([place]) Controlled, no change in medication   Depression with  anxiety Controlled, no change in medication     Follow Up Instructions:    I discussed the assessment and treatment plan with the patient. The patient was provided an opportunity to ask questions and all were answered. The patient agreed with the plan and demonstrated an understanding of the instructions.   The patient was advised to call back or seek an in-person evaluation if the symptoms worsen or if the condition fails to improve as anticipated.  I provided 22 minutes of non-face-to-face time during this encounter.   Tula Nakayama, MD

## 2020-12-03 NOTE — Patient Instructions (Addendum)
F/U in office in 4 months, re evaluate weight loss, call if you need me before  Non fasting HBA1C mid April  Continue phentermine as before, half tablet daily  Medication sent for infection, metronidazole and fluconazole.  Boric acid suppositories may be used for 30 days only ,and do NOT swallow It is important that you exercise regularly at least 30 minutes 5 times a week. If you develop chest pain, have severe difficulty breathing, or feel very tired, stop exercising immediately and seek medical attention  Think about what you will eat, plan ahead. Choose " clean, green, fresh or frozen" over canned, processed or packaged foods which are more sugary, salty and fatty. 70 to 75% of food eaten should be vegetables and fruit. Three meals at set times with snacks allowed between meals, but they must be fruit or vegetables. Aim to eat over a 12 hour period , example 7 am to 7 pm, and STOP after  your last meal of the day. Drink water,generally about 64 ounces per day, no other drink is as healthy. Fruit juice is best enjoyed in a healthy way, by EATING the fruit. Thanks for choosing [facility], we consider it a privelige to serve you.

## 2020-12-05 ENCOUNTER — Encounter (INDEPENDENT_AMBULATORY_CARE_PROVIDER_SITE_OTHER): Payer: Self-pay

## 2020-12-06 ENCOUNTER — Encounter: Payer: Self-pay | Admitting: Family Medicine

## 2020-12-06 NOTE — Assessment & Plan Note (Signed)
Controlled, no change in medication  

## 2020-12-06 NOTE — Assessment & Plan Note (Signed)
  Patient re-educated about  the importance of commitment to a  minimum of 150 minutes of exercise per week as able.  The importance of healthy food choices with portion control discussed, as well as eating regularly and within a 12 hour window most days. The need to choose "clean , green" food 50 to 75% of the time is discussed, as well as to make water the primary drink and set a goal of 64 ounces water daily.    Weight /BMI 11/25/2020 09/09/2020 08/27/2020  WEIGHT 340 lb 349 lb 343 lb  HEIGHT 5\' 7"  5\' 7"  5\' 7"   BMI 53.25 kg/m2 54.66 kg/m2 53.72 kg/m2   Reports weight loss, continue phentermine as before and re eval in 4 months with 10 poud weight loss target, minimum

## 2020-12-06 NOTE — Assessment & Plan Note (Addendum)
Recurrent BV, treated empirically with metronidazole, call back for persistent symptoms, advised on inquiry boric acid suppositories,  may be used for 30 days only, cautioned against ingestion

## 2020-12-06 NOTE — Assessment & Plan Note (Signed)
Patient educated about the importance of limiting  Carbohydrate intake , the need to commit to daily physical activity for a minimum of 30 minutes , and to commit weight loss. The fact that changes in all these areas will reduce or eliminate all together the development of diabetes is stressed.   Diabetic Labs Latest Ref Rng & Units 11/25/2020 09/04/2020 09/02/2020 05/29/2020 05/27/2020  HbA1c <5.7 % of total Hgb - 6.4(H) - - 6.1  Chol <200 mg/dL - - - 136 -  HDL > OR = 50 mg/dL - - - 58 -  Calc LDL mg/dL (calc) - - - 62 -  Triglycerides <150 mg/dL - - - 76 -  Creatinine 0.44 - 1.00 mg/dL 0.88 - 0.88 0.79 -   BP/Weight 11/25/2020 09/09/2020 08/27/2020 08/14/2020 07/14/2020 05/27/2020 6/43/3295  Systolic BP 188 416 606 301 601 093 235  Diastolic BP 88 95 89 84 97 97 84  Wt. (Lbs) 340 349 343 351 354 354 342  BMI 53.25 54.66 53.72 54.97 55.44 55.44 53.56   No flowsheet data found.   Updated lab needed at/ before next visit.

## 2020-12-06 NOTE — Assessment & Plan Note (Signed)
Increased due to recent fall, approx 11/25/2020, being treated by chiropractor successfully reportedly

## 2020-12-09 ENCOUNTER — Ambulatory Visit: Payer: 59 | Admitting: Nurse Practitioner

## 2020-12-16 ENCOUNTER — Ambulatory Visit (INDEPENDENT_AMBULATORY_CARE_PROVIDER_SITE_OTHER): Payer: 59 | Admitting: Nurse Practitioner

## 2020-12-16 ENCOUNTER — Encounter: Payer: Self-pay | Admitting: Nurse Practitioner

## 2020-12-16 ENCOUNTER — Other Ambulatory Visit: Payer: Self-pay

## 2020-12-16 ENCOUNTER — Other Ambulatory Visit: Payer: Self-pay | Admitting: Nurse Practitioner

## 2020-12-16 ENCOUNTER — Encounter: Payer: Self-pay | Admitting: Family Medicine

## 2020-12-16 ENCOUNTER — Ambulatory Visit: Payer: 59 | Admitting: Nurse Practitioner

## 2020-12-16 VITALS — BP 146/101 | HR 98 | Temp 97.1°F | Ht 67.0 in | Wt 345.4 lb

## 2020-12-16 VITALS — BP 142/91 | HR 114 | Temp 98.9°F | Resp 20 | Ht 67.0 in | Wt 342.0 lb

## 2020-12-16 DIAGNOSIS — R1030 Lower abdominal pain, unspecified: Secondary | ICD-10-CM | POA: Diagnosis not present

## 2020-12-16 DIAGNOSIS — K648 Other hemorrhoids: Secondary | ICD-10-CM | POA: Diagnosis not present

## 2020-12-16 DIAGNOSIS — N390 Urinary tract infection, site not specified: Secondary | ICD-10-CM | POA: Diagnosis not present

## 2020-12-16 DIAGNOSIS — K921 Melena: Secondary | ICD-10-CM

## 2020-12-16 DIAGNOSIS — R198 Other specified symptoms and signs involving the digestive system and abdomen: Secondary | ICD-10-CM | POA: Insufficient documentation

## 2020-12-16 DIAGNOSIS — R197 Diarrhea, unspecified: Secondary | ICD-10-CM | POA: Diagnosis not present

## 2020-12-16 DIAGNOSIS — R319 Hematuria, unspecified: Secondary | ICD-10-CM

## 2020-12-16 LAB — POCT URINALYSIS DIPSTICK
Bilirubin, UA: NEGATIVE
Glucose, UA: NEGATIVE
Ketones, UA: NEGATIVE
Nitrite, UA: NEGATIVE
Protein, UA: NEGATIVE
Spec Grav, UA: 1.02 (ref 1.010–1.025)
Urobilinogen, UA: 0.2 E.U./dL
pH, UA: 6 (ref 5.0–8.0)

## 2020-12-16 MED ORDER — SULFAMETHOXAZOLE-TRIMETHOPRIM 800-160 MG PO TABS: 1.0000 | ORAL_TABLET | Freq: Two times a day (BID) | ORAL | 0 refills | Status: DC

## 2020-12-16 MED ORDER — CIPROFLOXACIN HCL 500 MG PO TABS: 500.0000 mg | ORAL_TABLET | Freq: Two times a day (BID) | ORAL | 0 refills | Status: DC

## 2020-12-16 NOTE — Telephone Encounter (Signed)
I sent in bactrim.  

## 2020-12-16 NOTE — Addendum Note (Signed)
Addended by: Lonn Georgia on: 12/16/2020 02:25 PM   Modules accepted: Orders

## 2020-12-16 NOTE — Assessment & Plan Note (Signed)
-  these have been recurrent -has appt with urology -we discussed importance of keeping soap away from her vulva as this may increase her pH and contribute to her recurrence of UTIs and/or BV (had this previously) -Rx. cipro

## 2020-12-16 NOTE — Patient Instructions (Addendum)
Please tke the ciprofloxacin as prescribed.  Return to the clinic in 1 week if symptoms do not improve. Urinary Tract Infection, Adult  A urinary tract infection (UTI) is an infection of any part of the urinary tract. The urinary tract includes the kidneys, ureters, bladder, and urethra. These organs make, store, and get rid of urine in the body. An upper UTI affects the ureters and kidneys. A lower UTI affects the bladder and urethra. What are the causes? Most urinary tract infections are caused by bacteria in your genital area around your urethra, where urine leaves your body. These bacteria grow and cause inflammation of your urinary tract. What increases the risk? You are more likely to develop this condition if:  You have a urinary catheter that stays in place.  You are not able to control when you urinate or have a bowel movement (incontinence).  You are female and you: ? Use a spermicide or diaphragm for birth control. ? Have low estrogen levels. ? Are pregnant.  You have certain genes that increase your risk.  You are sexually active.  You take antibiotic medicines.  You have a condition that causes your flow of urine to slow down, such as: ? An enlarged prostate, if you are female. ? Blockage in your urethra. ? A kidney stone. ? A nerve condition that affects your bladder control (neurogenic bladder). ? Not getting enough to drink, or not urinating often.  You have certain medical conditions, such as: ? Diabetes. ? A weak disease-fighting system (immunesystem). ? Sickle cell disease. ? Gout. ? Spinal cord injury. What are the signs or symptoms? Symptoms of this condition include:  Needing to urinate right away (urgency).  Frequent urination. This may include small amounts of urine each time you urinate.  Pain or burning with urination.  Blood in the urine.  Urine that smells bad or unusual.  Trouble urinating.  Cloudy urine.  Vaginal discharge, if you  are female.  Pain in the abdomen or the lower back. You may also have:  Vomiting or a decreased appetite.  Confusion.  Irritability or tiredness.  A fever or chills.  Diarrhea. The first symptom in older adults may be confusion. In some cases, they may not have any symptoms until the infection has worsened. How is this diagnosed? This condition is diagnosed based on your medical history and a physical exam. You may also have other tests, including:  Urine tests.  Blood tests.  Tests for STIs (sexually transmitted infections). If you have had more than one UTI, a cystoscopy or imaging studies may be done to determine the cause of the infections. How is this treated? Treatment for this condition includes:  Antibiotic medicine.  Over-the-counter medicines to treat discomfort.  Drinking enough water to stay hydrated. If you have frequent infections or have other conditions such as a kidney stone, you may need to see a health care provider who specializes in the urinary tract (urologist). In rare cases, urinary tract infections can cause sepsis. Sepsis is a life-threatening condition that occurs when the body responds to an infection. Sepsis is treated in the hospital with IV antibiotics, fluids, and other medicines. Follow these instructions at home: Medicines  Take over-the-counter and prescription medicines only as told by your health care provider.  If you were prescribed an antibiotic medicine, take it as told by your health care provider. Do not stop using the antibiotic even if you start to feel better. General instructions  Make sure you: ? Empty  your bladder often and completely. Do not hold urine for long periods of time. ? Empty your bladder after sex. ? Wipe from front to back after urinating or having a bowel movement if you are female. Use each tissue only one time when you wipe.  Drink enough fluid to keep your urine pale yellow.  Keep all follow-up visits.  This is important.   Contact a health care provider if:  Your symptoms do not get better after 1-2 days.  Your symptoms go away and then return. Get help right away if:  You have severe pain in your back or your lower abdomen.  You have a fever or chills.  You have nausea or vomiting. Summary  A urinary tract infection (UTI) is an infection of any part of the urinary tract, which includes the kidneys, ureters, bladder, and urethra.  Most urinary tract infections are caused by bacteria in your genital area.  Treatment for this condition often includes antibiotic medicines.  If you were prescribed an antibiotic medicine, take it as told by your health care provider. Do not stop using the antibiotic even if you start to feel better.  Keep all follow-up visits. This is important. This information is not intended to replace advice given to you by your health care provider. Make sure you discuss any questions you have with your health care provider. Document Revised: 06/13/2020 Document Reviewed: 06/13/2020 Elsevier Patient Education  [place].

## 2020-12-16 NOTE — Telephone Encounter (Signed)
Pt made an appt 12-16-20 at 1:40 with Donneta Romberg

## 2020-12-16 NOTE — Patient Instructions (Signed)
Your health issues we discussed today were:   Abdominal pain: 1. I am glad you are doing better with this! 2. Continue to use Bentyl as needed for your abdominal pain 3. Call us for any worsening or severe symptoms  Diarrhea: 1. As we discussed, because dairy seems to be a significant trigger of your diarrhea, you can try lactase dairy digestive enzyme supplement 2. Follow the instructions on the box.  We generally take this with your first bite of dairy 3. This can help prevent diarrhea if you have an intolerance to lactose (a sugar found in milk) 4. You can also use Imodium as needed 5. Call if any worsening or severe symptoms  Hematochezia (blood in your stool): 1. Because you previously had blood in your stools I do recommend we proceed with rescheduling your colonoscopy 2. Your last colonoscopy did show you have hemorrhoids which could cause bleeding 3. However, it is better to be safe and make sure there is nothing else significant going on 4. We will help schedule your colonoscopy for you 5. Further recommendations will follow  Overall I recommend:  1. Continue other current medications 2. Return for follow-up in 4 months 3. Call us for any questions or concerns   ---------------------------------------------------------------  I am glad you have gotten your COVID-19 vaccination!  Even though you are fully vaccinated you should continue to follow CDC and state/local guidelines.  ---------------------------------------------------------------   At [facility] we value your feedback. You may receive a survey about your visit today. Please share your experience as we strive to create trusting relationships with our patients to provide genuine, compassionate, quality care.  We appreciate your understanding and patience as we review any laboratory studies, imaging, and other diagnostic tests that are ordered as we care for you. Our office policy is 5 business  days for review of these results, and any emergent or urgent results are addressed in a timely manner for your best interest. If you do not hear from our office in 1 week, please contact us.   We also encourage the use of MyChart, which contains your medical information for your review as well. If you are not enrolled in this feature, an access code is on this after visit summary for your convenience. Thank you for allowing [place] to be involved in your care.  It was great to see you today!  I hope you have a safe and warm winter!!

## 2020-12-16 NOTE — Progress Notes (Signed)
Referring Provider: [name], MD Primary Care Physician:  [name], MD Primary GI:  Dr. Gala Romney  Chief Complaint  Patient presents with  . Abdominal Pain    None recently  . Diarrhea    Mixed with constipation    HPI:   Jennifer [name] is a 45 y.o. female who presents for follow-up.  The patient was last seen in our office 09/09/2020 for lower abdominal pain, diarrhea, hematochezia.  Previous colonoscopy in 2015 with grade 3 hemorrhoids and scattered left-sided diverticula.  Single polyp at the cecum found to be lymphoid.  Recommended hemorrhoid banding.  She did have a history of diarrhea in the setting of Metformin.  Abdominal pain historically improved after bowel movement.  Abdominal x-ray and 2016 with no acute findings.  When she saw her PCP in October 2021 noted diffuse abdominal tenderness and recommended CT of the abdomen and C. difficile, referral to GI.  Stool for C. difficile was completed and negative.  CT abdomen and pelvis was scheduled but never completed.  At her last visit she noted left lower quadrant lower abdominal pain that is cramping improved with bowel movement, loose stools that stopped 2 weeks prior.  Pain unchanged however.  No prolonged straining.  Does not take anything for bowel movements.  Milk causes [place] 6-7 stools.  Does have slight hematochezia with her diarrhea.  Previously on Lomotil but has not taken in a while, and never tried Bentyl.  At that time it was noted that she had the CT scheduled (which ended up not being completed).  Recommended Bentyl 10 mg twice a day as needed, hold Bentyl for constipation, colonoscopy, follow-up in 3 months.  The patient was initially scheduled in November for colonoscopy but has had to be canceled because she tested positive for Covid.  It was rescheduled to 12/02/2020.  However, it appears this was canceled as well and there is no indication as to why.  Today states doing okay overall. She has  not had any abdominal pain in a while. She can usually tell when she may have abdominal pain flare at which point she takes a Bentyl and that helps prevent it. Still with diarrhea alternating with constipation. She generally has Has diarrhea about once or twice a week; last time was due to consuming dairy. Feels most of her diarrhea episodes are due to diet issues (dairy, excessive fruit). Occasional constipation about once every couple weeks; eventually resolves. She does have daily bowel movements. When constipated it will just take a while to pass. Denies N/V, hematochezia, melena, fever, chills, unintentional weight loss. Denies URI or flu-like symptoms. Denies loss of sense of taste or smell. The patient has received COVID-19 vaccination(s). Denies chest pain, dyspnea, dizziness, lightheadedness, syncope, near syncope. Denies any other upper or lower GI symptoms.  She had to cancel her colonoscopy due to the winter weather and her driver not comfortable driving in the snow/ice.  Past Medical History:  Diagnosis Date  . Abnormal vaginal Pap smear   . Alopecia   . Arthritis   . Depression   . Diabetes mellitus without complication (HCC)    prediabetic, takes no meds  . Diverticulosis   . Family history of diabetes mellitus   . GERD (gastroesophageal reflux disease)   . Hemorrhoids   . History of recurrent UTIs   . Hypertension   . Obesity     Past Surgical History:  Procedure Laterality Date  . APPENDECTOMY    .  COLONOSCOPY N/A 03/13/2014   Dr. Gala Romney- grade 3 hemorrhoids, colonic diverticulosis bx= benign lymphoid polyp  . KNEE ARTHROSCOPY     right    Current Outpatient Medications  Medication Sig Dispense Refill  . acetaminophen (TYLENOL) 500 MG tablet Take 500 mg by mouth every 6 (six) hours as needed for mild pain or moderate pain.    . busPIRone (BUSPAR) 7.5 MG tablet Take 1 tablet (7.5 mg total) by mouth 3 (three) times daily. 270 tablet 2  . Cholecalciferol (VITAMIN D3)  1.25 MG (50000 UT) CAPS TAKE 1 CAPSULE BY MOUTH ONCE A WEEK (Patient taking differently: Take 50,000 Units by mouth every Friday. TAKE 1 CAPSULE BY MOUTH ONCE A WEEK) 12 capsule 1  . ciprofloxacin (CIPRO) 500 MG tablet Take 1 tablet (500 mg total) by mouth 2 (two) times daily. 14 tablet 0  . clotrimazole-betamethasone (LOTRISONE) cream APPLY TO AFFECTED AREA TWICE A DAY (Patient taking differently: Apply 1 application topically 2 (two) times daily.) 45 g 1  . dicyclomine (BENTYL) 10 MG capsule Take 1 capsule (10 mg total) by mouth 2 (two) times daily as needed for spasms. 60 capsule 2  . fluticasone (FLONASE) 50 MCG/ACT nasal spray Place 2 sprays into both nostrils daily. 16 g 6  . hydrOXYzine (ATARAX/VISTARIL) 50 MG tablet TAKE ONE TABLET AT BEDTIME FOR SLEEP (Patient taking differently: Take 50 mg by mouth at bedtime as needed (sleep).) 90 tablet 0  . meloxicam (MOBIC) 15 MG tablet Take 15 mg by mouth daily.    . norethindrone (INCASSIA) 0.35 MG tablet Take 1 tablet (0.35 mg total) by mouth daily. 84 tablet 1  . phentermine (ADIPEX-P) 37.5 MG tablet Take one half tablet once daly at breakfast for weigh loss 30 tablet 1  . triamterene-hydrochlorothiazide (MAXZIDE-25) 37.5-25 MG tablet Take 1 tablet by mouth daily. 90 tablet 3  . venlafaxine XR (EFFEXOR-XR) 150 MG 24 hr capsule Take 1 capsule (150 mg total) by mouth daily with breakfast. 30 capsule 5   No current facility-administered medications for this visit.    Allergies as of 12/16/2020  . (No Known Allergies)    Family History  Adopted: Yes  Problem Relation Age of Onset  . Hypertension Mother   . Diabetes Mother   . Other Mother        cholangiocarcinoma, age 90, deceased  . Colon cancer Neg Hx     Social History   Socioeconomic History  . Marital status: Single    Spouse name: Not on file  . Number of children: 4  . Years of education: Not on file  . Highest education level: Not on file  Occupational History  .  Occupation: UNEMPLOYWED SINCE 10/2010  Tobacco Use  . Smoking status: Never Smoker  . Smokeless tobacco: Never Used  Vaping Use  . Vaping Use: Never used  Substance and Sexual Activity  . Alcohol use: No  . Drug use: No  . Sexual activity: Not Currently    Birth control/protection: I.U.D., Condom  Other Topics Concern  . Not on file  Social History Narrative  . Not on file   Social Determinants of Health   Financial Resource Strain: Not on file  Food Insecurity: Not on file  Transportation Needs: Not on file  Physical Activity: Not on file  Stress: Not on file  Social Connections: Not on file    Subjective: Review of Systems  Constitutional: Negative for chills, fever, malaise/fatigue and weight loss.  HENT: Negative for congestion and sore throat.  Respiratory: Negative for cough and shortness of breath.   Cardiovascular: Negative for chest pain and palpitations.  Gastrointestinal: Positive for abdominal pain (Less frequent) and diarrhea (Less frequent, usually food triggers). Negative for blood in stool, heartburn, melena, nausea and vomiting.  Musculoskeletal: Negative for joint pain and myalgias.  Skin: Negative for rash.  Neurological: Negative for dizziness and weakness.  Endo/Heme/Allergies: Does not bruise/bleed easily.  Psychiatric/Behavioral: Negative for depression. The patient is not nervous/anxious.   All other systems reviewed and are negative.    Objective: BP (!) 146/101   Pulse 98   Temp (!) 97.1 F (36.2 C)   Ht _0  (1.702 m)   Wt (!) 345 lb 6.4 oz (156.7 kg)   LMP 11/15/2020   BMI 54.10 kg/m  Physical Exam Vitals and nursing note reviewed.  Constitutional:      General: She is not in acute distress.    Appearance: Normal appearance. She is well-developed. She is obese. She is not ill-appearing, toxic-appearing or diaphoretic.  HENT:     Head: Normocephalic and atraumatic.     Nose: No congestion or rhinorrhea.  Eyes:     General: No  scleral icterus. Cardiovascular:     Rate and Rhythm: Normal rate and regular rhythm.     Heart sounds: Normal heart sounds.  Pulmonary:     Effort: Pulmonary effort is normal. No respiratory distress.     Breath sounds: Normal breath sounds.  Abdominal:     General: Bowel sounds are normal.     Palpations: Abdomen is soft. There is no hepatomegaly, splenomegaly or mass.     Tenderness: There is no abdominal tenderness. There is no guarding or rebound.     Hernia: No hernia is present.  Skin:    General: Skin is warm and dry.     Coloration: Skin is not jaundiced.     Findings: No rash.  Neurological:     General: No focal deficit present.     Mental Status: She is alert and oriented to person, place, and time.  Psychiatric:        Attention and Perception: Attention normal.        Mood and Affect: Mood normal.        Speech: Speech normal.        Behavior: Behavior normal.        Thought Content: Thought content normal.        Cognition and Memory: Cognition and memory normal.      Assessment:  Very pleasant 45 year old female who presents to follow-up on abdominal pain and diarrhea.  She was previously scheduled for colonoscopy but had to cancel because of the winter weather and her driver's inability to/discomfort driving in the ice and snow.  Clinically she is doing well.  No red flag/warning signs or symptoms.  Abdominal pain: Abdominal pain has significantly improved, much less common.  If she feels like she is going to have abdominal pain she will take her Bentyl and this helps hold it off.  She has not had a significant episode in some time.  Diarrhea: She still occasionally has diarrhea about once or twice a week.  Typically she feels this is due to dietary triggers such as dairy or excessive fiber.  We discussed the use of Lactaid/lactase trial with dairy to see if this will allow her to enjoy dairy but not have significant symptoms.  She does occasionally have  constipation but still with daily bowel movements, just requiring a  little excessive time to pass a stool.  She is not overly concerned about this at this time.  Need for colonoscopy: She is an African-American female age 61, currently due for colonoscopy.  She wants to proceed with rescheduling at this time   Proceed with colonoscopy on propofol/MAC by Dr. Gala Romney in near future: the risks, benefits, and alternatives have been discussed with the patient in detail. The patient states understanding and desires to proceed.  Patient is currently on BuSpar, Atarax, Effexor.  Her BMI is greater than 45. The patient is not on any other anticoagulants, anxiolytics, chronic pain medications, antidepressants, antidiabetics, or iron supplements.  We will plan for the procedure on propofol/MAC to promote adequate sedation.   Plan: 1. Continue current medications 2. Colonoscopy as described above 3. Follow-up in 4 months    Thank you for allowing [place] to participate in the care of [place], DNP, AGNP-C Adult & Gerontological Nurse Practitioner [facility]   12/16/2020 3:28 PM   Disclaimer: This note was dictated with voice recognition software. Similar sounding words can inadvertently be transcribed and may not be corrected upon review.

## 2020-12-16 NOTE — Progress Notes (Signed)
Acute Office Visit  Subjective:    Patient ID: Jennifer [name], female    DOB: [birth year], 45 y.o.   MRN: [id number]  Chief Complaint  Patient presents with  . Urinary Retention    Frequency x 4 days     HPI Patient is in today for urinary frequency x 4 days.  She states that her urine is dark and she had chills on Saturday, but has not had them since.  Past Medical History:  Diagnosis Date  . Abnormal vaginal Pap smear   . Alopecia   . Arthritis   . Depression   . Diabetes mellitus without complication (HCC)    prediabetic, takes no meds  . Diverticulosis   . Family history of diabetes mellitus   . GERD (gastroesophageal reflux disease)   . Hemorrhoids   . History of recurrent UTIs   . Hypertension   . Obesity     Past Surgical History:  Procedure Laterality Date  . APPENDECTOMY    . COLONOSCOPY N/A 03/13/2014   Dr. Jena Gauss- grade 3 hemorrhoids, colonic diverticulosis bx= benign lymphoid polyp  . KNEE ARTHROSCOPY     right    Family History  Adopted: Yes  Problem Relation Age of Onset  . Hypertension Mother   . Diabetes Mother   . Other Mother        cholangiocarcinoma, age 66, deceased  . Colon cancer Neg Hx     Social History   Socioeconomic History  . Marital status: Single    Spouse name: Not on file  . Number of children: 4  . Years of education: Not on file  . Highest education level: Not on file  Occupational History  . Occupation: UNEMPLOYWED SINCE 10/2010  Tobacco Use  . Smoking status: Never Smoker  . Smokeless tobacco: Never Used  Vaping Use  . Vaping Use: Never used  Substance and Sexual Activity  . Alcohol use: No  . Drug use: No  . Sexual activity: Not Currently    Birth control/protection: I.U.D., Condom  Other Topics Concern  . Not on file  Social History Narrative  . Not on file   Social Determinants of Health   Financial Resource Strain: Not on file  Food Insecurity: Not on file  Transportation Needs: Not on file   Physical Activity: Not on file  Stress: Not on file  Social Connections: Not on file  Intimate Partner Violence: Not on file    Outpatient Medications Prior to Visit  Medication Sig Dispense Refill  . acetaminophen (TYLENOL) 500 MG tablet Take 500 mg by mouth every 6 (six) hours as needed for mild pain or moderate pain.    . busPIRone (BUSPAR) 7.5 MG tablet Take 1 tablet (7.5 mg total) by mouth 3 (three) times daily. 270 tablet 2  . Cholecalciferol (VITAMIN D3) 1.25 MG (50000 UT) CAPS TAKE 1 CAPSULE BY MOUTH ONCE A WEEK (Patient taking differently: Take 50,000 Units by mouth every Friday. TAKE 1 CAPSULE BY MOUTH ONCE A WEEK) 12 capsule 1  . clotrimazole-betamethasone (LOTRISONE) cream APPLY TO AFFECTED AREA TWICE A DAY (Patient taking differently: Apply 1 application topically 2 (two) times daily.) 45 g 1  . dicyclomine (BENTYL) 10 MG capsule Take 1 capsule (10 mg total) by mouth 2 (two) times daily as needed for spasms. 60 capsule 2  . fluticasone (FLONASE) 50 MCG/ACT nasal spray Place 2 sprays into both nostrils daily. 16 g 6  . hydrOXYzine (ATARAX/VISTARIL) 50 MG tablet TAKE ONE TABLET AT  BEDTIME FOR SLEEP (Patient taking differently: Take 50 mg by mouth at bedtime as needed (sleep).) 90 tablet 0  . meloxicam (MOBIC) 15 MG tablet Take 15 mg by mouth daily.    . metroNIDAZOLE (FLAGYL) 500 MG tablet Take 1 tablet (500 mg total) by mouth 2 (two) times daily. 14 tablet 0  . norethindrone (INCASSIA) 0.35 MG tablet Take 1 tablet (0.35 mg total) by mouth daily. 84 tablet 1  . phentermine (ADIPEX-P) 37.5 MG tablet Take one half tablet once daly at breakfast for weigh loss 30 tablet 1  . triamterene-hydrochlorothiazide (MAXZIDE-25) 37.5-25 MG tablet Take 1 tablet by mouth daily. 90 tablet 3  . venlafaxine XR (EFFEXOR-XR) 150 MG 24 hr capsule Take 1 capsule (150 mg total) by mouth daily with breakfast. 30 capsule 5   No facility-administered medications prior to visit.    No Known  Allergies  Review of Systems  Constitutional: Positive for chills. Negative for fatigue and fever.  Genitourinary: Positive for frequency. Negative for hematuria and vaginal discharge.       Urine is dark; has vaginal odor       Objective:    Physical Exam Constitutional:      Appearance: She is obese.  Cardiovascular:     Rate and Rhythm: Normal rate and regular rhythm.     Pulses: Normal pulses.     Heart sounds: Normal heart sounds.  Pulmonary:     Effort: Pulmonary effort is normal.     Breath sounds: Normal breath sounds.  Neurological:     Mental Status: She is alert.     BP (!) 142/91   Pulse (!) 114   Temp 98.9 F (37.2 C)   Resp 20   Ht 5\' 7"  (1.702 m)   Wt (!) 342 lb (155.1 kg)   SpO2 95%   BMI 53.56 kg/m  Wt Readings from Last 3 Encounters:  12/16/20 (!) 342 lb (155.1 kg)  11/25/20 (!) 340 lb (154.2 kg)  09/09/20 (!) 349 lb (158.3 kg)    Health Maintenance Due  Topic Date Due  . COVID-19 Vaccine (3 - Booster for Moderna series) 08/28/2020    There are no preventive care reminders to display for this patient.   Lab Results  Component Value Date   TSH 2.21 05/29/2020   Lab Results  Component Value Date   WBC 6.0 05/29/2020   HGB 14.1 05/29/2020   HCT 42.2 05/29/2020   MCV 91.5 05/29/2020   PLT 315 05/29/2020   Lab Results  Component Value Date   NA 138 11/25/2020   K 3.7 11/25/2020   CO2 24 11/25/2020   GLUCOSE 100 (H) 11/25/2020   BUN 19 11/25/2020   CREATININE 0.88 11/25/2020   BILITOT 0.3 05/29/2020   ALKPHOS 48 12/08/2016   AST 21 05/29/2020   ALT 25 05/29/2020   PROT 7.1 05/29/2020   ALBUMIN 4.1 12/08/2016   CALCIUM 9.3 11/25/2020   ANIONGAP 11 11/25/2020   Lab Results  Component Value Date   CHOL 136 05/29/2020   Lab Results  Component Value Date   HDL 58 05/29/2020   Lab Results  Component Value Date   LDLCALC 62 05/29/2020   Lab Results  Component Value Date   TRIG 76 05/29/2020   Lab Results  Component  Value Date   CHOLHDL 2.3 05/29/2020   Lab Results  Component Value Date   HGBA1C 6.4 (H) 09/04/2020       Assessment & Plan:   Problem List Items  Addressed This Visit      Genitourinary   Urinary tract infection with hematuria - Primary    -these have been recurrent -has appt with urology -we discussed importance of keeping soap away from her vulva as this may increase her pH and contribute to her recurrence of UTIs and/or BV (had this previously) -Rx. cipro      Relevant Orders   Urine Culture       Meds ordered this encounter  Medications  . ciprofloxacin (CIPRO) 500 MG tablet    Sig: Take 1 tablet (500 mg total) by mouth 2 (two) times daily.    Dispense:  14 tablet    Refill:  0     Heather Roberts, NP

## 2020-12-17 ENCOUNTER — Encounter: Payer: Self-pay | Admitting: Internal Medicine

## 2020-12-18 LAB — URINE CULTURE

## 2020-12-18 NOTE — Progress Notes (Signed)
Urine culture did not grow anything. Is she feeling better?

## 2020-12-23 ENCOUNTER — Ambulatory Visit: Payer: 59 | Admitting: Nurse Practitioner

## 2020-12-23 ENCOUNTER — Other Ambulatory Visit: Payer: Self-pay | Admitting: Physical Medicine and Rehabilitation

## 2020-12-23 DIAGNOSIS — M545 Low back pain, unspecified: Secondary | ICD-10-CM

## 2020-12-23 DIAGNOSIS — M5416 Radiculopathy, lumbar region: Secondary | ICD-10-CM

## 2020-12-25 ENCOUNTER — Encounter: Payer: Self-pay | Admitting: Nurse Practitioner

## 2020-12-25 ENCOUNTER — Telehealth (INDEPENDENT_AMBULATORY_CARE_PROVIDER_SITE_OTHER): Payer: 59 | Admitting: Nurse Practitioner

## 2020-12-25 ENCOUNTER — Other Ambulatory Visit: Payer: Self-pay

## 2020-12-25 DIAGNOSIS — N390 Urinary tract infection, site not specified: Secondary | ICD-10-CM | POA: Insufficient documentation

## 2020-12-25 NOTE — Assessment & Plan Note (Signed)
-  resolved today -finished her bactrim, and urine culture grew mixed urogenital flora

## 2020-12-25 NOTE — Progress Notes (Signed)
Acute Office Visit  Subjective:    Patient ID: Jennifer [name], female    DOB: 1976/09/14, 45 y.o.   MRN: [id number]  Chief Complaint  Patient presents with  . Follow-up    Urinary tract symptoms have resolved.     HPI Patient is in today for f/u for UTI. She was to f/u in 1 week if symptoms did not improve, but she has no symptoms today. Her urine culture was unremarkable.  Past Medical History:  Diagnosis Date  . Abnormal vaginal Pap smear   . Alopecia   . Arthritis   . Depression   . Diabetes mellitus without complication (HCC)    prediabetic, takes no meds  . Diverticulosis   . Family history of diabetes mellitus   . GERD (gastroesophageal reflux disease)   . Hemorrhoids   . History of recurrent UTIs   . Hypertension   . Obesity     Past Surgical History:  Procedure Laterality Date  . APPENDECTOMY    . COLONOSCOPY N/A 03/13/2014   Dr. Jena Gauss- grade 3 hemorrhoids, colonic diverticulosis bx= benign lymphoid polyp  . KNEE ARTHROSCOPY     right    Family History  Adopted: Yes  Problem Relation Age of Onset  . Hypertension Mother   . Diabetes Mother   . Other Mother        cholangiocarcinoma, age 33, deceased  . Colon cancer Neg Hx     Social History   Socioeconomic History  . Marital status: Single    Spouse name: Not on file  . Number of children: 4  . Years of education: Not on file  . Highest education level: Not on file  Occupational History  . Occupation: UNEMPLOYWED SINCE 10/2010  Tobacco Use  . Smoking status: Never Smoker  . Smokeless tobacco: Never Used  Vaping Use  . Vaping Use: Never used  Substance and Sexual Activity  . Alcohol use: No  . Drug use: No  . Sexual activity: Not Currently    Birth control/protection: I.U.D., Condom  Other Topics Concern  . Not on file  Social History Narrative  . Not on file   Social Determinants of Health   Financial Resource Strain: Not on file  Food Insecurity: Not on file  Transportation  Needs: Not on file  Physical Activity: Not on file  Stress: Not on file  Social Connections: Not on file  Intimate Partner Violence: Not on file    Outpatient Medications Prior to Visit  Medication Sig Dispense Refill  . acetaminophen (TYLENOL) 500 MG tablet Take 500 mg by mouth every 6 (six) hours as needed for mild pain or moderate pain.    . busPIRone (BUSPAR) 7.5 MG tablet Take 1 tablet (7.5 mg total) by mouth 3 (three) times daily. 270 tablet 2  . Cholecalciferol (VITAMIN D3) 1.25 MG (50000 UT) CAPS TAKE 1 CAPSULE BY MOUTH ONCE A WEEK (Patient taking differently: Take 50,000 Units by mouth every Friday. TAKE 1 CAPSULE BY MOUTH ONCE A WEEK) 12 capsule 1  . clotrimazole-betamethasone (LOTRISONE) cream APPLY TO AFFECTED AREA TWICE A DAY (Patient taking differently: Apply 1 application topically 2 (two) times daily.) 45 g 1  . dicyclomine (BENTYL) 10 MG capsule Take 1 capsule (10 mg total) by mouth 2 (two) times daily as needed for spasms. 60 capsule 2  . fluticasone (FLONASE) 50 MCG/ACT nasal spray Place 2 sprays into both nostrils daily. 16 g 6  . hydrOXYzine (ATARAX/VISTARIL) 50 MG tablet TAKE ONE TABLET  AT BEDTIME FOR SLEEP (Patient taking differently: Take 50 mg by mouth at bedtime as needed (sleep).) 90 tablet 0  . meloxicam (MOBIC) 15 MG tablet Take 15 mg by mouth daily.    . norethindrone (INCASSIA) 0.35 MG tablet Take 1 tablet (0.35 mg total) by mouth daily. 84 tablet 1  . phentermine (ADIPEX-P) 37.5 MG tablet Take one half tablet once daly at breakfast for weigh loss 30 tablet 1  . triamterene-hydrochlorothiazide (MAXZIDE-25) 37.5-25 MG tablet Take 1 tablet by mouth daily. 90 tablet 3  . venlafaxine XR (EFFEXOR-XR) 150 MG 24 hr capsule Take 1 capsule (150 mg total) by mouth daily with breakfast. 30 capsule 5  . sulfamethoxazole-trimethoprim (BACTRIM DS) 800-160 MG tablet Take 1 tablet by mouth 2 (two) times daily. 14 tablet 0   No facility-administered medications prior to visit.     No Known Allergies  Review of Systems  Genitourinary: Positive for frequency. Negative for difficulty urinating, dysuria, flank pain, hematuria, pelvic pain and urgency.       She states she is urinating more since she is hydrating more; denies odor in urine       Objective:    Physical Exam  There were no vitals taken for this visit. Wt Readings from Last 3 Encounters:  12/16/20 (!) 345 lb 6.4 oz (156.7 kg)  12/16/20 (!) 342 lb (155.1 kg)  11/25/20 (!) 340 lb (154.2 kg)    Health Maintenance Due  Topic Date Due  . COVID-19 Vaccine (3 - Booster for Moderna series) 08/28/2020    There are no preventive care reminders to display for this patient.   Lab Results  Component Value Date   TSH 2.21 05/29/2020   Lab Results  Component Value Date   WBC 6.0 05/29/2020   HGB 14.1 05/29/2020   HCT 42.2 05/29/2020   MCV 91.5 05/29/2020   PLT 315 05/29/2020   Lab Results  Component Value Date   NA 138 11/25/2020   K 3.7 11/25/2020   CO2 24 11/25/2020   GLUCOSE 100 (H) 11/25/2020   BUN 19 11/25/2020   CREATININE 0.88 11/25/2020   BILITOT 0.3 05/29/2020   ALKPHOS 48 12/08/2016   AST 21 05/29/2020   ALT 25 05/29/2020   PROT 7.1 05/29/2020   ALBUMIN 4.1 12/08/2016   CALCIUM 9.3 11/25/2020   ANIONGAP 11 11/25/2020   Lab Results  Component Value Date   CHOL 136 05/29/2020   Lab Results  Component Value Date   HDL 58 05/29/2020   Lab Results  Component Value Date   LDLCALC 62 05/29/2020   Lab Results  Component Value Date   TRIG 76 05/29/2020   Lab Results  Component Value Date   CHOLHDL 2.3 05/29/2020   Lab Results  Component Value Date   HGBA1C 6.4 (H) 09/04/2020       Assessment & Plan:   Problem List Items Addressed This Visit      Genitourinary   UTI (urinary tract infection)    -resolved today -finished her bactrim, and urine culture grew mixed urogenital flora          No orders of the defined types were placed in this  encounter.  Date:  12/25/2020   Location of Patient: Home Location of Provider: Office Consent was obtain for visit to be over via telehealth. I verified that I am speaking with the correct person using two identifiers.  I connected with  Jennifer [name] on 12/25/20 via telephone and verified that I am  speaking with the correct person using two identifiers.   I discussed the limitations of evaluation and management by telemedicine. The patient expressed understanding and agreed to proceed.  Time spent: 6 minutes   Heather Roberts, NP

## 2021-01-04 ENCOUNTER — Ambulatory Visit
Admission: RE | Admit: 2021-01-04 | Discharge: 2021-01-04 | Disposition: A | Discharge status: Home / Self Care | Payer: 59 | Source: Ambulatory Visit | Attending: Physical Medicine and Rehabilitation | Admitting: Physical Medicine and Rehabilitation

## 2021-01-04 ENCOUNTER — Other Ambulatory Visit: Payer: Self-pay

## 2021-01-04 DIAGNOSIS — M5416 Radiculopathy, lumbar region: Secondary | ICD-10-CM

## 2021-01-04 DIAGNOSIS — M545 Low back pain, unspecified: Secondary | ICD-10-CM

## 2021-01-04 IMAGING — MR MR LUMBAR SPINE W/O CM
4 of 5 series · 18 of 48 positions shown · non-contrast
Comparison: Radiography [DATE]

CLINICAL DATA: Central low back pain. Lumbar radiculopathy with
bilateral knee pain.

EXAM:
MRI LUMBAR SPINE WITHOUT CONTRAST
TECHNIQUE: Multiplanar, multisequence MR imaging of the lumbar spine was
performed. No intravenous contrast was administered.

[Series 6: T2 · sagittal · 4.0mm · 0.73mm/px · 5 of 15 slices shown (1 of 2)]
[im 1/15]
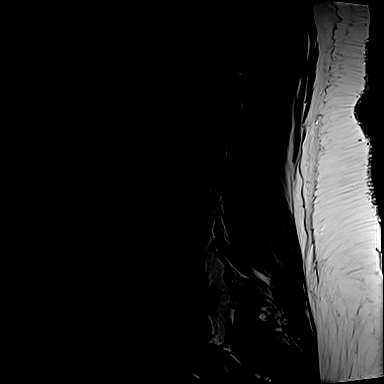
[im 4/15]
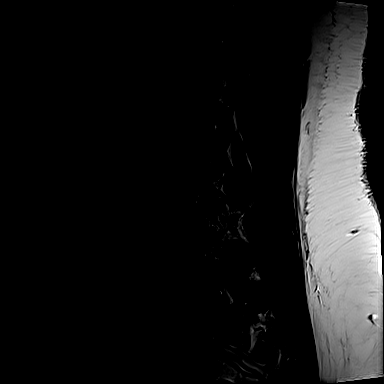
[im 8/15]
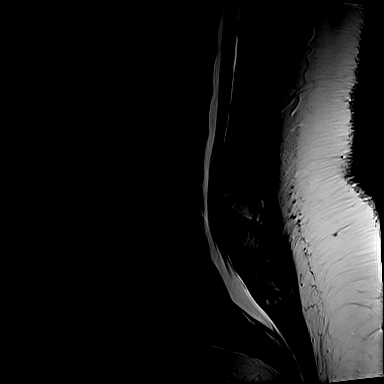
[im 11/15]
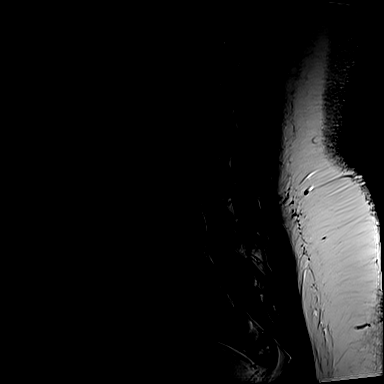
[im 15/15]
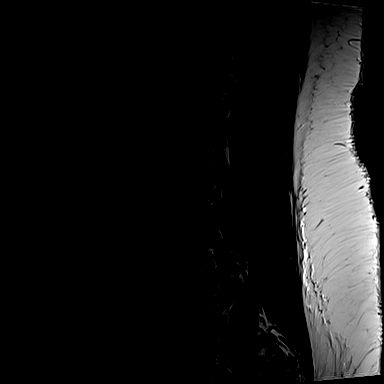

[Series 7: T1 · sagittal · 4.0mm · 0.88mm/px · 3 of 15 slices shown (1 of 2)]
[im 1/15]
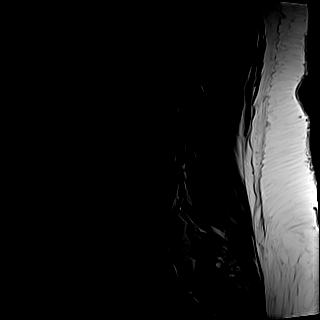
[im 8/15]
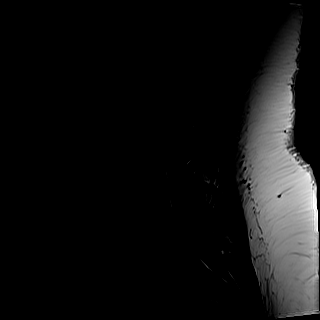
[im 15/15]
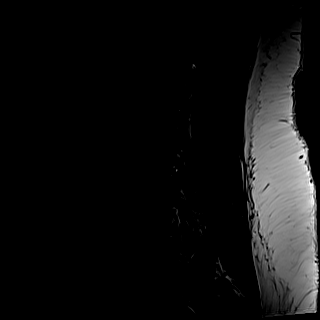

[Series 12: T2 · axial · 4.0mm · 0.28mm/px · z∈[-57,+134]mm · 7 of 42 slices shown (2 of 2)]
[im 3/42]
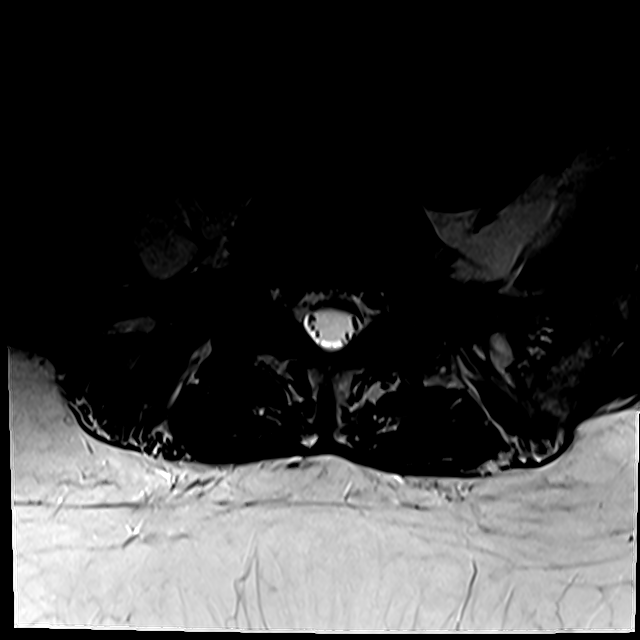
[im 6/42]
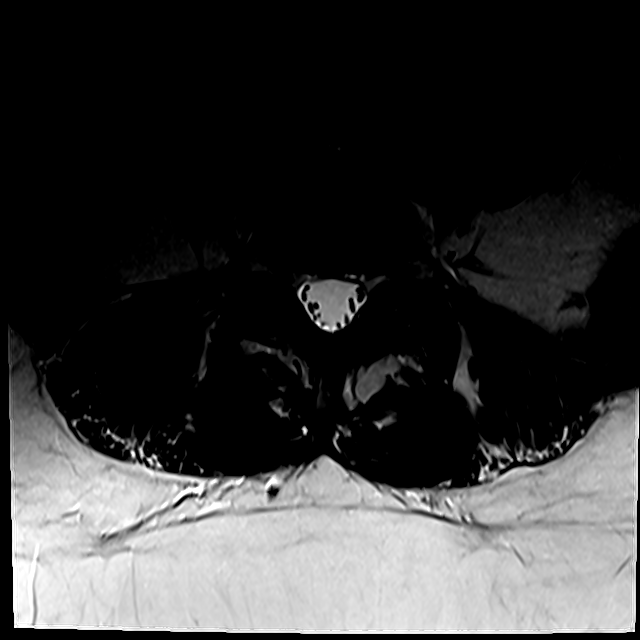
[im 9/42]
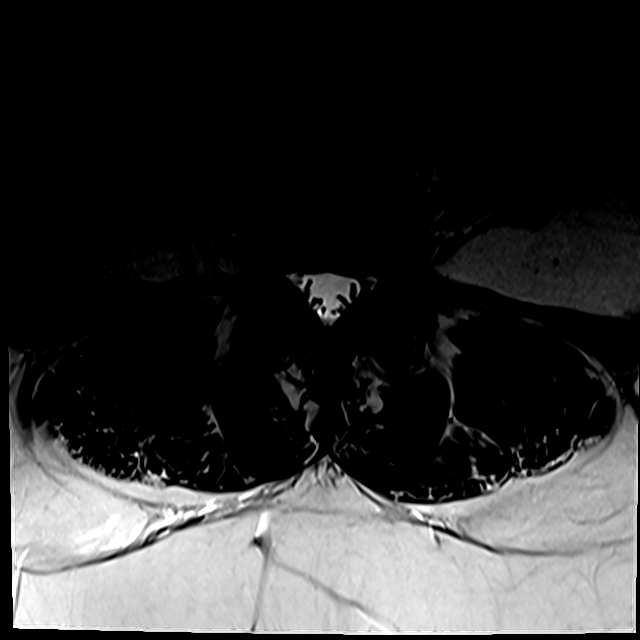
[im 14/42]
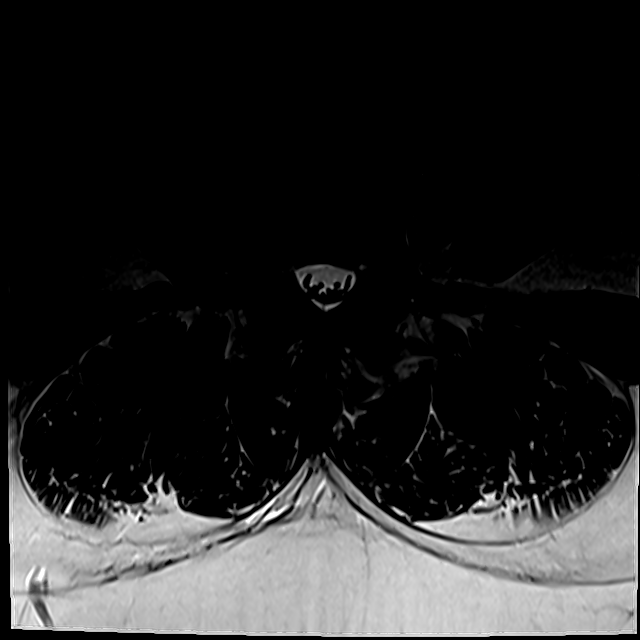
[im 20/42]
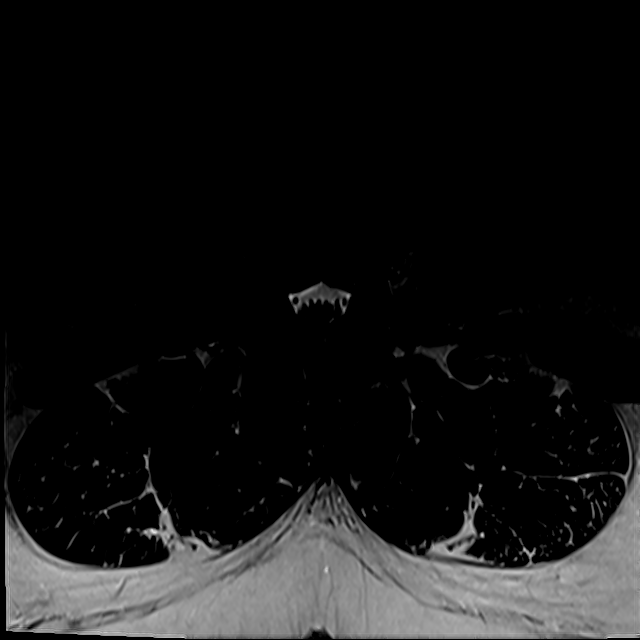
[im 22/42]
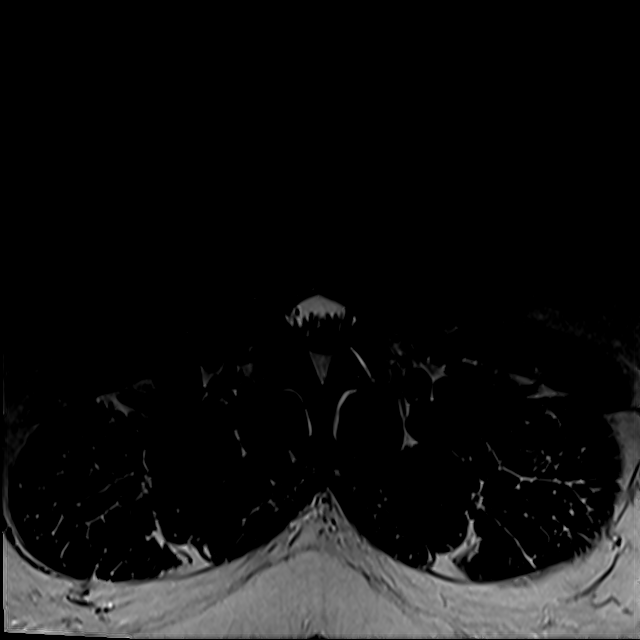
[im 36/42]
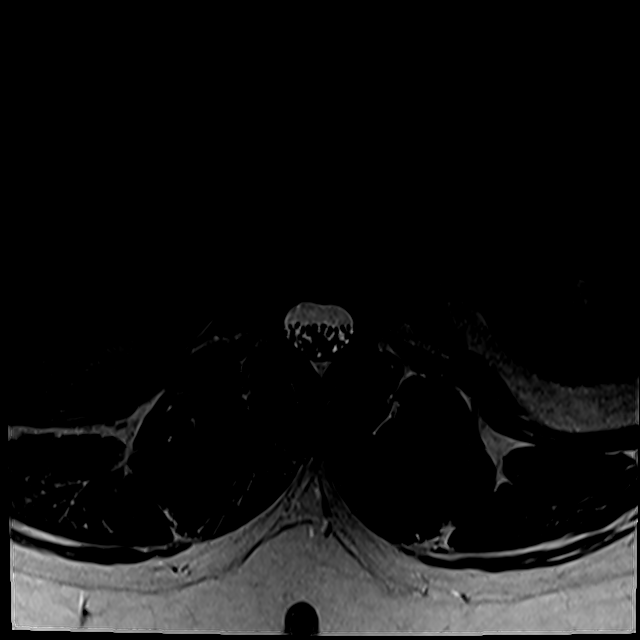

[Series 15: T1 · axial · 4.0mm · 0.28mm/px · z∈[-43,+134]mm · 3 of 42 slices shown (2 of 2)]
[im 6/42]
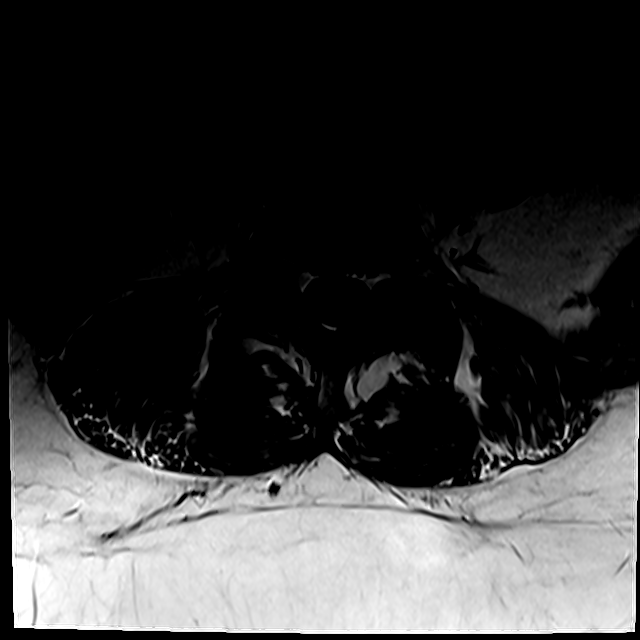
[im 22/42]
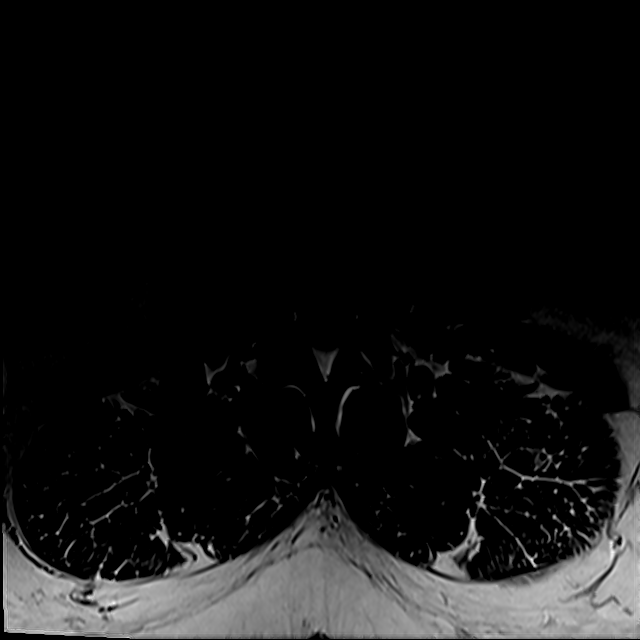
[im 36/42]
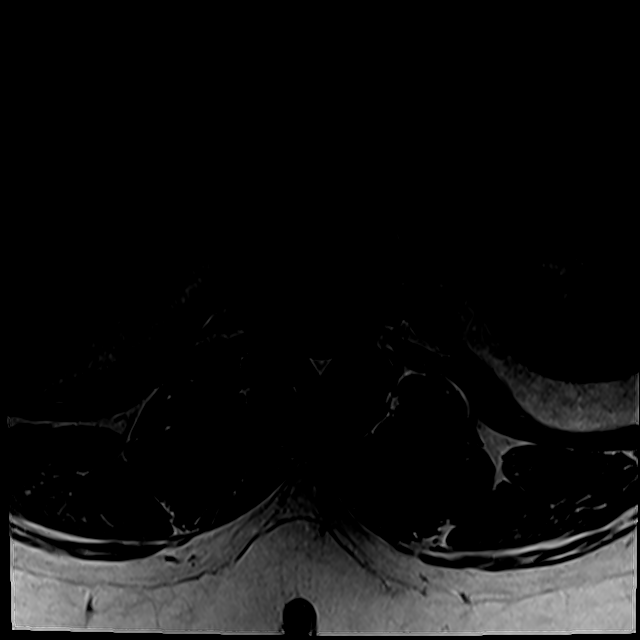

[18 of 48 positions shown; findings below may reference images not displayed]

FINDINGS: Segmentation:  5 lumbar type vertebrae

Alignment:  Grade 1 anterolisthesis at L4-5

Vertebrae:  No fracture, evidence of discitis, or bone lesion.

Conus medullaris and cauda equina: Conus extends to the L1 level.
Conus and cauda equina appear normal.

Paraspinal and other soft tissues: Negative.

Disc levels:

T12- L1: Unremarkable.

L1-L2: Unremarkable.

L2-L3: Unremarkable.

L3-L4: Left foraminal herniation contacting the left L3 nerve root.
Facet osteoarthritis with spurring and ligamentous thickening on
both sides.

L4-L5: Facet osteoarthritis with spurring and mild anterolisthesis.
Mild disc narrowing and bulging. Small left foraminal protrusion
without nerve root compression

L5-S1:Facet osteoarthritis with bulky spurring. No herniation or
impingement
IMPRESSION: 1. Dominant finding is facet osteoarthritis at L3-4 and below with
mild L4-5 anterolisthesis.
2. L3-4 left foraminal herniation potentially affecting the left L3
nerve root.

## 2021-01-08 ENCOUNTER — Other Ambulatory Visit: Payer: 59

## 2021-01-12 ENCOUNTER — Ambulatory Visit: Payer: Medicaid Other | Admitting: Urology

## 2021-01-12 DIAGNOSIS — R3912 Poor urinary stream: Secondary | ICD-10-CM

## 2021-01-16 ENCOUNTER — Other Ambulatory Visit: Payer: Self-pay | Admitting: Family Medicine

## 2021-01-21 NOTE — Patient Instructions (Signed)
Buffy DAMETRIA TUZZOLINO  01/21/2021     @PREFPERIOPPHARMACY @   Your procedure is scheduled on  01/26/2021.   Report to [facility] at  1000  A.M.   Call this number if you have problems the morning of surgery:  [phone]   Remember:  Follow the diet and prep instructions given to you by the office.                     Take these medicines the morning of surgery with A SIP OF WATER  Buspar, mobic(if needed), effexor.    Please brush your teeth.  Do not wear jewelry, make-up or nail polish.  Do not wear lotions, powders, or perfumes, or deodorant.  Do not shave 48 hours prior to surgery.  Men may shave face and neck.  Do not bring valuables to the hospital.  [facility] is not responsible for any belongings or valuables.   Contacts, dentures or bridgework may not be worn into surgery.  Leave your suitcase in the car.  After surgery it may be brought to your room.  For patients admitted to the hospital, discharge time will be determined by your treatment team.  Patients discharged the day of surgery will not be allowed to drive home and must have someone with them for 24 hours.     Special instructions:  DO NOT smoke tobacco or vape the morning of  your procedure.   Please read over the following fact sheets that you were given. Anesthesia Post-op Instructions and Care and Recovery After Surgery       Colonoscopy, Adult, Care After This sheet gives you information about how to care for yourself after your procedure. Your health care provider may also give you more specific instructions. If you have problems or questions, contact your health care provider. What can I expect after the procedure? After the procedure, it is common to have:  A small amount of blood in your stool for 24 hours after the procedure.  Some gas.  Mild cramping or bloating of your abdomen. Follow these instructions at home: Eating and drinking  Drink enough fluid to keep your urine pale  yellow.  Follow instructions from your health care provider about eating or drinking restrictions.  Resume your normal diet as instructed by your health care provider. Avoid heavy or fried foods that are hard to digest.   Activity  Rest as told by your health care provider.  Avoid sitting for a long time without moving. Get up to take short walks every 1-2 hours. This is important to improve blood flow and breathing. Ask for help if you feel weak or unsteady.  Return to your normal activities as told by your health care provider. Ask your health care provider what activities are safe for you. Managing cramping and bloating  Try walking around when you have cramps or feel bloated.  Apply heat to your abdomen as told by your health care provider. Use the heat source that your health care provider recommends, such as a moist heat pack or a heating pad. ? Place a towel between your skin and the heat source. ? Leave the heat on for 20-30 minutes. ? Remove the heat if your skin turns bright red. This is especially important if you are unable to feel pain, heat, or cold. You may have a greater risk of getting burned.   General instructions  If you were given a sedative during the  procedure, it can affect you for several hours. Do not drive or operate machinery until your health care provider says that it is safe.  For the first 24 hours after the procedure: ? Do not sign important documents. ? Do not drink alcohol. ? Do your regular daily activities at a slower pace than normal. ? Eat soft foods that are easy to digest.  Take over-the-counter and prescription medicines only as told by your health care provider.  Keep all follow-up visits as told by your health care provider. This is important. Contact a health care provider if:  You have blood in your stool 2-3 days after the procedure. Get help right away if you have:  More than a small spotting of blood in your stool.  Large blood  clots in your stool.  Swelling of your abdomen.  Nausea or vomiting.  A fever.  Increasing pain in your abdomen that is not relieved with medicine. Summary  After the procedure, it is common to have a small amount of blood in your stool. You may also have mild cramping and bloating of your abdomen.  If you were given a sedative during the procedure, it can affect you for several hours. Do not drive or operate machinery until your health care provider says that it is safe.  Get help right away if you have a lot of blood in your stool, nausea or vomiting, a fever, or increased pain in your abdomen. This information is not intended to replace advice given to you by your health care provider. Make sure you discuss any questions you have with your health care provider. Document Revised: 10/26/2019 Document Reviewed: 05/28/2019 Elsevier Patient Education  2021 [place] After This sheet gives you information about how to care for yourself after your procedure. Your health care provider may also give you more specific instructions. If you have problems or questions, contact your health care provider. What can I expect after the procedure? After the procedure, it is common to have:  Tiredness.  Forgetfulness about what happened after the procedure.  Impaired judgment for important decisions.  Nausea or vomiting.  Some difficulty with balance. Follow these instructions at home: For the time period you were told by your health care provider:  Rest as needed.  Do not participate in activities where you could fall or become injured.  Do not drive or use machinery.  Do not drink alcohol.  Do not take sleeping pills or medicines that cause drowsiness.  Do not make important decisions or sign legal documents.  Do not take care of children on your own.      Eating and drinking  Follow the diet that is recommended by your health care  provider.  Drink enough fluid to keep your urine pale yellow.  If you vomit: ? Drink water, juice, or soup when you can drink without vomiting. ? Make sure you have little or no nausea before eating solid foods. General instructions  Have a responsible adult stay with you for the time you are told. It is important to have someone help care for you until you are awake and alert.  Take over-the-counter and prescription medicines only as told by your health care provider.  If you have sleep apnea, surgery and certain medicines can increase your risk for breathing problems. Follow instructions from your health care provider about wearing your sleep device: ? Anytime you are sleeping, including during daytime naps. ? While taking prescription pain medicines,  sleeping medicines, or medicines that make you drowsy.  Avoid smoking.  Keep all follow-up visits as told by your health care provider. This is important. Contact a health care provider if:  You keep feeling nauseous or you keep vomiting.  You feel light-headed.  You are still sleepy or having trouble with balance after 24 hours.  You develop a rash.  You have a fever.  You have redness or swelling around the IV site. Get help right away if:  You have trouble breathing.  You have new-onset confusion at home. Summary  For several hours after your procedure, you may feel tired. You may also be forgetful and have poor judgment.  Have a responsible adult stay with you for the time you are told. It is important to have someone help care for you until you are awake and alert.  Rest as told. Do not drive or operate machinery. Do not drink alcohol or take sleeping pills.  Get help right away if you have trouble breathing, or if you suddenly become confused. This information is not intended to replace advice given to you by your health care provider. Make sure you discuss any questions you have with your health care  provider. Document Revised: 07/17/2020 Document Reviewed: 10/04/2019 Elsevier Patient Education  2021 [employer].

## 2021-01-22 ENCOUNTER — Other Ambulatory Visit: Payer: Self-pay

## 2021-01-22 ENCOUNTER — Other Ambulatory Visit (HOSPITAL_COMMUNITY)
Admission: RE | Admit: 2021-01-22 | Discharge: 2021-01-22 | Disposition: A | Discharge status: Home / Self Care | Payer: 59 | Source: Ambulatory Visit | Attending: Internal Medicine | Admitting: Internal Medicine

## 2021-01-22 ENCOUNTER — Encounter (HOSPITAL_COMMUNITY)
Admission: RE | Admit: 2021-01-22 | Discharge: 2021-01-22 | Disposition: A | Discharge status: Home / Self Care | Payer: 59 | Source: Ambulatory Visit | Attending: Internal Medicine | Admitting: Internal Medicine

## 2021-01-22 DIAGNOSIS — Z01818 Encounter for other preprocedural examination: Secondary | ICD-10-CM | POA: Insufficient documentation

## 2021-01-22 DIAGNOSIS — Z20822 Contact with and (suspected) exposure to covid-19: Secondary | ICD-10-CM | POA: Insufficient documentation

## 2021-01-22 LAB — PREGNANCY, URINE: Preg Test, Ur: NEGATIVE

## 2021-01-22 NOTE — Progress Notes (Signed)
   01/22/21 1447  OBSTRUCTIVE SLEEP APNEA  Have you ever been diagnosed with sleep apnea through a sleep study? No  Do you snore loudly (loud enough to be heard through closed doors)?  1  Do you often feel tired, fatigued, or sleepy during the daytime (such as falling asleep during driving or talking to someone)? 1  Has anyone observed you stop breathing during your sleep? 1  Do you have, or are you being treated for high blood pressure? 1  BMI more than 35 kg/m2? 1  Age > 50 (1-yes) 0  Neck circumference greater than:Female 16 inches or larger, Female 17inches or larger? 0  Female Gender (Yes=1) 0  Obstructive Sleep Apnea Score 5  Score 5 or greater  Results sent to PCP

## 2021-01-23 LAB — SARS CORONAVIRUS 2 (TAT 6-24 HRS): SARS Coronavirus 2: NEGATIVE

## 2021-01-26 ENCOUNTER — Ambulatory Visit (HOSPITAL_COMMUNITY): Payer: 59 | Admitting: Anesthesiology

## 2021-01-26 ENCOUNTER — Encounter (HOSPITAL_COMMUNITY): Payer: Self-pay | Admitting: Internal Medicine

## 2021-01-26 ENCOUNTER — Other Ambulatory Visit: Payer: Self-pay

## 2021-01-26 ENCOUNTER — Encounter (HOSPITAL_COMMUNITY)
Admission: RE | Disposition: A | Discharge status: Home / Self Care | Payer: Self-pay | Source: Home / Self Care | Attending: Internal Medicine

## 2021-01-26 ENCOUNTER — Ambulatory Visit (HOSPITAL_COMMUNITY)
Admission: RE | Admit: 2021-01-26 | Discharge: 2021-01-26 | Disposition: A | Discharge status: Home / Self Care | Payer: 59 | Attending: Internal Medicine | Admitting: Internal Medicine

## 2021-01-26 DIAGNOSIS — Z1211 Encounter for screening for malignant neoplasm of colon: Secondary | ICD-10-CM | POA: Insufficient documentation

## 2021-01-26 DIAGNOSIS — Z800 Family history of malignant neoplasm of digestive organs: Secondary | ICD-10-CM | POA: Insufficient documentation

## 2021-01-26 DIAGNOSIS — Z79899 Other long term (current) drug therapy: Secondary | ICD-10-CM | POA: Insufficient documentation

## 2021-01-26 DIAGNOSIS — Z8249 Family history of ischemic heart disease and other diseases of the circulatory system: Secondary | ICD-10-CM | POA: Insufficient documentation

## 2021-01-26 DIAGNOSIS — Z791 Long term (current) use of non-steroidal anti-inflammatories (NSAID): Secondary | ICD-10-CM | POA: Diagnosis not present

## 2021-01-26 DIAGNOSIS — D120 Benign neoplasm of cecum: Secondary | ICD-10-CM | POA: Diagnosis not present

## 2021-01-26 DIAGNOSIS — K635 Polyp of colon: Secondary | ICD-10-CM | POA: Diagnosis not present

## 2021-01-26 DIAGNOSIS — K644 Residual hemorrhoidal skin tags: Secondary | ICD-10-CM | POA: Diagnosis not present

## 2021-01-26 DIAGNOSIS — Z833 Family history of diabetes mellitus: Secondary | ICD-10-CM | POA: Insufficient documentation

## 2021-01-26 DIAGNOSIS — K5730 Diverticulosis of large intestine without perforation or abscess without bleeding: Secondary | ICD-10-CM | POA: Insufficient documentation

## 2021-01-26 DIAGNOSIS — Z87440 Personal history of urinary (tract) infections: Secondary | ICD-10-CM | POA: Diagnosis not present

## 2021-01-26 HISTORY — PX: COLONOSCOPY WITH PROPOFOL: SHX5780

## 2021-01-26 HISTORY — PX: POLYPECTOMY: SHX5525

## 2021-01-26 LAB — GLUCOSE, CAPILLARY: Glucose-Capillary: 112 mg/dL — ABNORMAL HIGH (ref 70–99)

## 2021-01-26 SURGERY — COLONOSCOPY WITH PROPOFOL
Anesthesia: General

## 2021-01-26 MED ORDER — LACTATED RINGERS IV SOLN: INTRAVENOUS | Status: DC

## 2021-01-26 MED ORDER — ESMOLOL HCL 100 MG/10ML IV SOLN
INTRAVENOUS | Status: AC
  Filled 2021-01-26: qty 10

## 2021-01-26 MED ORDER — IPRATROPIUM-ALBUTEROL 0.5-2.5 (3) MG/3ML IN SOLN
3.0000 mL | Freq: Once | RESPIRATORY_TRACT | Status: AC
  Administered 2021-01-26: 3 mL via RESPIRATORY_TRACT

## 2021-01-26 MED ORDER — STERILE WATER FOR IRRIGATION IR SOLN
Status: DC | PRN
  Administered 2021-01-26: 1.5 mL

## 2021-01-26 MED ORDER — EPHEDRINE SULFATE 50 MG/ML IJ SOLN
INTRAMUSCULAR | Status: DC | PRN
  Administered 2021-01-26: 20 mg via INTRAVENOUS

## 2021-01-26 MED ORDER — IPRATROPIUM-ALBUTEROL 0.5-2.5 (3) MG/3ML IN SOLN
RESPIRATORY_TRACT | Status: AC
  Filled 2021-01-26: qty 3

## 2021-01-26 MED ORDER — PROPOFOL 10 MG/ML IV BOLUS
INTRAVENOUS | Status: DC | PRN
  Administered 2021-01-26: 20 mg via INTRAVENOUS
  Administered 2021-01-26: 10 mg via INTRAVENOUS
  Administered 2021-01-26 (×4): 20 mg via INTRAVENOUS
  Administered 2021-01-26: 10 mg via INTRAVENOUS
  Administered 2021-01-26 (×2): 20 mg via INTRAVENOUS
  Administered 2021-01-26 (×2): 40 mg via INTRAVENOUS
  Administered 2021-01-26: 20 mg via INTRAVENOUS
  Administered 2021-01-26: 40 mg via INTRAVENOUS

## 2021-01-26 NOTE — Op Note (Signed)
[facility] Patient Name: Jennifer [name] Procedure Date: 01/26/2021 10:33 AM MRN: [id number] Date of Birth: 05-Nov-1976 Attending MD: Gennette Pac , MD CSN: [id number] Age: 45 Admit Type: Outpatient Procedure:                Colonoscopy Indications:              Screening for colorectal malignant neoplasm Providers:                Gennette Pac, MD, Angelica Ran, Kristine L.                            Jessee Avers, Technician Referring MD:              Medicines:                Propofol per Anesthesia Complications:            No immediate complications. Estimated Blood Loss:     Estimated blood loss was minimal. Procedure:                Pre-Anesthesia Assessment:                           - Prior to the procedure, a History and Physical                            was performed, and patient medications and                            allergies were reviewed. The patient's tolerance of                            previous anesthesia was also reviewed. The risks                            and benefits of the procedure and the sedation                            options and risks were discussed with the patient.                            All questions were answered, and informed consent                            was obtained. Prior Anticoagulants: The patient has                            taken no previous anticoagulant or antiplatelet                            agents. ASA Grade Assessment: III - A patient with                            severe systemic disease. After reviewing the risks  and benefits, the patient was deemed in                            satisfactory condition to undergo the procedure.                           After obtaining informed consent, the colonoscope                            was passed under direct vision. Throughout the                            procedure, the patient's blood pressure, pulse, and                             oxygen saturations were monitored continuously. The                            CF-HQ190L ([id number]) scope was introduced through                            the anus and advanced to the the cecum, identified                            by appendiceal orifice and ileocecal valve. The                            colonoscopy was performed without difficulty. The                            patient tolerated the procedure well. The quality                            of the bowel preparation was adequate. Scope In: 10:55:07 AM Scope Out: 11:10:07 AM Scope Withdrawal Time: 0 hours 9 minutes 39 seconds  Total Procedure Duration: 0 hours 15 minutes 0 seconds  Findings:      Hemorrhoids were found on perianal exam.      Skin tags were found on perianal exam.      Scattered medium-mouthed diverticula were found in the sigmoid colon,       descending colon, transverse colon and distal transverse colon.      A 3 mm polyp was found in the cecum. The polyp was sessile. The polyp       was removed with a cold snare. Resection and retrieval were complete.       Estimated blood loss was minimal.      The exam was otherwise without abnormality on direct and retroflexion       views. Impression:               - Hemorrhoids found on perianal exam.                           - Perianal skin tags found on perianal exam.                           -  Diverticulosis in the sigmoid colon, in the                            descending colon, in the transverse colon and in                            the distal transverse colon.                           - One 3 mm polyp in the cecum, removed with a cold                            snare. Resected and retrieved.                           - The examination was otherwise normal on direct                            and retroflexion views. Moderate Sedation:      Moderate (conscious) sedation was personally administered by an       anesthesia professional. The  following parameters were monitored: oxygen       saturation, heart rate, blood pressure, respiratory rate, EKG, adequacy       of pulmonary ventilation, and response to care. Recommendation:           - Patient has a contact number available for                            emergencies. The signs and symptoms of potential                            delayed complications were discussed with the                            patient. Return to normal activities tomorrow.                            Written discharge instructions were provided to the                            patient.                           - Advance diet as tolerated.                           - Continue present medications.                           - Repeat colonoscopy date to be determined after                            pending pathology results are reviewed for  surveillance based on pathology results.                           - Return to GI office in 3 months. Procedure Code(s):        --- Professional ---                           [phone], Colonoscopy, flexible; with removal of                            tumor(s), polyp(s), or other lesion(s) by snare                            technique Diagnosis Code(s):        --- Professional ---                           Z12.11, Encounter for screening for malignant                            neoplasm of colon                           K64.9, Unspecified hemorrhoids                           K63.5, Polyp of colon                           K64.4, Residual hemorrhoidal skin tags                           K57.30, Diverticulosis of large intestine without                            perforation or abscess without bleeding CPT copyright 2019 American Medical Association. All rights reserved. The codes documented in this report are preliminary and upon coder review may  be revised to meet current compliance requirements. Gerrit Friends. Hinley Brimage, MD Gennette Pac, MD 01/26/2021 11:20:32 AM This report has been signed electronically. Number of Addenda: 0

## 2021-01-26 NOTE — Anesthesia Preprocedure Evaluation (Addendum)
Anesthesia Evaluation  Patient identified by MRN, date of birth, ID band Patient awake    Reviewed: Allergy & Precautions, NPO status , Patient's Chart, lab work & pertinent test results  Airway Mallampati: II  TM Distance: >3 FB Neck ROM: Full    Dental  (+) Missing, Poor Dentition   Pulmonary shortness of breath and with exertion, pneumonia (as per patient chronic bronchitis ),    Pulmonary exam normal breath sounds clear to auscultation       Cardiovascular Exercise Tolerance: Poor hypertension, Pt. on medications  Rhythm:Regular Rate:Tachycardia - Systolic murmurs, - Diastolic murmurs and - Friction Rub 22-Jan-2021 14:42:13 [facility]-AP-300 ROUTINE RECORD Sinus tachycardia Cannot rule out Anterior infarct , age undetermined Abnormal ECG   Neuro/Psych PSYCHIATRIC DISORDERS Anxiety Depression negative neurological ROS     GI/Hepatic Neg liver ROS, GERD  Medicated and Controlled,  Endo/Other  diabetes, Well Controlled, Type 2  Renal/GU negative Renal ROS     Musculoskeletal  (+) Arthritis , Osteoarthritis,    Abdominal   Peds  Hematology negative hematology ROS (+)   Anesthesia Other Findings   Reproductive/Obstetrics negative OB ROS                          Anesthesia Physical Anesthesia Plan  ASA: III  Anesthesia Plan: General   Post-op Pain Management:    Induction: Intravenous  PONV Risk Score and Plan: Propofol infusion  Airway Management Planned: Nasal Cannula, Natural Airway and Simple Face Mask  Additional Equipment:   Intra-op Plan:   Post-operative Plan:   Informed Consent: I have reviewed the patients History and Physical, chart, labs and discussed the procedure including the risks, benefits and alternatives for the proposed anesthesia with the patient or authorized representative who has indicated his/her understanding and acceptance.     Dental  advisory given  Plan Discussed with: CRNA and Surgeon  Anesthesia Plan Comments: (Took phentermine on Friday, explained to the patient risks and told her that we will cancel the case if there is fluctuations in her blood pressures)     Anesthesia Quick Evaluation

## 2021-01-26 NOTE — Discharge Instructions (Signed)
  Colonoscopy Discharge Instructions  Read the instructions outlined below and refer to this sheet in the next few weeks. These discharge instructions provide you with general information on caring for yourself after you leave the hospital. Your doctor may also give you specific instructions. While your treatment has been planned according to the most current medical practices available, unavoidable complications occasionally occur. If you have any problems or questions after discharge, call Dr. Gala Romney at [phone]. ACTIVITY  You may resume your regular activity, but move at a slower pace for the next 24 hours.   Take frequent rest periods for the next 24 hours.   Walking will help get rid of the air and reduce the bloated feeling in your belly (abdomen).   No driving for 24 hours (because of the medicine (anesthesia) used during the test).    Do not sign any important legal documents or operate any machinery for 24 hours (because of the anesthesia used during the test).  NUTRITION  Drink plenty of fluids.   You may resume your normal diet as instructed by your doctor.   Begin with a light meal and progress to your normal diet. Heavy or fried foods are harder to digest and may make you feel sick to your stomach (nauseated).   Avoid alcoholic beverages for 24 hours or as instructed.  MEDICATIONS  You may resume your normal medications unless your doctor tells you otherwise.  WHAT YOU CAN EXPECT TODAY  Some feelings of bloating in the abdomen.   Passage of more gas than usual.   Spotting of blood in your stool or on the toilet paper.  IF YOU HAD POLYPS REMOVED DURING THE COLONOSCOPY:  No aspirin products for 7 days or as instructed.   No alcohol for 7 days or as instructed.   Eat a soft diet for the next 24 hours.  FINDING OUT THE RESULTS OF YOUR TEST Not all test results are available during your visit. If your test results are not back during the visit, make an appointment  with your caregiver to find out the results. Do not assume everything is normal if you have not heard from your caregiver or the medical facility. It is important for you to follow up on all of your test results.  SEEK IMMEDIATE MEDICAL ATTENTION IF:  You have more than a spotting of blood in your stool.   Your belly is swollen (abdominal distention).   You are nauseated or vomiting.   You have a temperature over 101.   You have abdominal pain or discomfort that is severe or gets worse throughout the day.    1 small polyp removed from your colon today  Further recommendations to follow pending review of pathology report  You have significant hemorrhoids-likely the source of rectal bleeding  You would be best served by seeing a surgeon to get them removed  Office visit with [place] in 3 months [place])  At patient request, I called Colleen Can at [phone] -discussed results `

## 2021-01-26 NOTE — Transfer of Care (Signed)
Immediate Anesthesia Transfer of Care Note  Patient: Jennifer [name]  Procedure(s) Performed: COLONOSCOPY WITH PROPOFOL (N/A ) POLYPECTOMY  Patient Location: PACU  Anesthesia Type:General  Level of Consciousness: awake, alert , oriented and patient cooperative  Airway & Oxygen Therapy: Patient Spontanous Breathing  Post-op Assessment: Report given to RN, Post -op Vital signs reviewed and stable and Patient moving all extremities  Post vital signs: Reviewed and stable  Last Vitals:  Vitals Value Taken Time  BP    Temp    Pulse    Resp    SpO2      Last Pain:  Vitals:   01/26/21 1047  PainSc: 0-No pain      Patients Stated Pain Goal: 7 (54/86/28 2417)  Complications: No complications documented.

## 2021-01-26 NOTE — Anesthesia Postprocedure Evaluation (Signed)
Anesthesia Post Note  Patient: Jennifer [name]  Procedure(s) Performed: COLONOSCOPY WITH PROPOFOL (N/A ) POLYPECTOMY  Patient location during evaluation: Phase II Anesthesia Type: General Level of consciousness: awake, oriented, awake and alert and patient cooperative Pain management: satisfactory to patient Vital Signs Assessment: post-procedure vital signs reviewed and stable Respiratory status: spontaneous breathing, respiratory function stable and nonlabored ventilation Cardiovascular status: stable Postop Assessment: no apparent nausea or vomiting Anesthetic complications: no   No complications documented.   Last Vitals:  Vitals:   01/26/21 1005 01/26/21 1120  BP: (!) 127/100 98/70  Pulse: (!) 103 96  Resp: 17 18  Temp:  36.4 C  SpO2: 96% 98%    Last Pain:  Vitals:   01/26/21 1120  TempSrc: Oral  PainSc: 0-No pain                 Natalin Bible

## 2021-01-26 NOTE — H&P (Signed)
@LOGO @   Primary Care Physician:  [name], MD Primary Gastroenterologist:  Dr. Gala Romney  Pre-Procedure History & Physical: HPI:  Jennifer [name] is a 45 y.o. female here for   Past Medical History:  Diagnosis Date  . Abnormal vaginal Pap smear   . Alopecia   . Arthritis   . Depression   . Diabetes mellitus without complication (HCC)    prediabetic, takes no meds  . Diverticulosis   . Family history of diabetes mellitus   . GERD (gastroesophageal reflux disease)   . Hemorrhoids   . History of recurrent UTIs   . Hypertension   . Obesity     Past Surgical History:  Procedure Laterality Date  . APPENDECTOMY    . COLONOSCOPY N/A 03/13/2014   Dr. Gala Romney- grade 3 hemorrhoids, colonic diverticulosis bx= benign lymphoid polyp  . KNEE ARTHROSCOPY     right    Prior to Admission medications   Medication Sig Start Date End Date Taking? Authorizing Provider  acetaminophen (TYLENOL) 500 MG tablet Take 1,000 mg by mouth every 6 (six) hours as needed for mild pain or moderate pain.   Yes [provider]  busPIRone (BUSPAR) 7.5 MG tablet Take 1 tablet (7.5 mg total) by mouth 3 (three) times daily. 09/23/20  Yes [name], MD  Cholecalciferol (VITAMIN D3) 1.25 MG (50000 UT) CAPS TAKE 1 CAPSULE BY MOUTH ONCE A WEEK Patient taking differently: Take 50,000 Units by mouth every Friday. TAKE 1 CAPSULE BY MOUTH ONCE A WEEK 09/23/20  Yes [name], MD  dicyclomine (BENTYL) 10 MG capsule Take 1 capsule (10 mg total) by mouth 2 (two) times daily as needed for spasms. 09/09/20  Yes Carlis Stable, NP  fluticasone (FLONASE) 50 MCG/ACT nasal spray Place 2 sprays into both nostrils daily. Patient taking differently: Place 2 sprays into both nostrils daily as needed for allergies. 02/06/18  Yes [name], MD  hydrOXYzine (ATARAX/VISTARIL) 50 MG tablet TAKE ONE TABLET AT BEDTIME FOR SLEEP Patient taking differently: Take 50 mg by mouth at bedtime as needed  (sleep). 09/09/20  Yes [name], MD  meloxicam (MOBIC) 15 MG tablet Take 15 mg by mouth daily. 10/13/20  Yes [provider]  norethindrone (INCASSIA) 0.35 MG tablet Take 1 tablet (0.35 mg total) by mouth daily. 12/03/20  Yes [name], MD  phentermine (ADIPEX-P) 37.5 MG tablet Take one half tablet once daly at breakfast for weigh loss Patient taking differently: Take 18.75 mg by mouth daily before breakfast. 12/03/20  Yes [name], MD  triamterene-hydrochlorothiazide (MAXZIDE-25) 37.5-25 MG tablet Take 1 tablet by mouth daily. 05/27/20  Yes [name], MD  venlafaxine XR (EFFEXOR-XR) 150 MG 24 hr capsule TAKE 1 CAPSULE BY MOUTH DAILY WITH BREAKFAST. 01/19/21  Yes [name], MD  clotrimazole-betamethasone (LOTRISONE) cream APPLY TO AFFECTED AREA TWICE A DAY Patient not taking: Reported on 01/12/2021 09/19/20   [name], MD    Allergies as of 12/16/2020  . (No Known Allergies)    Family History  Adopted: Yes  Problem Relation Age of Onset  . Hypertension Mother   . Diabetes Mother   . Other Mother        cholangiocarcinoma, age 47, deceased  . Colon cancer Neg Hx     Social History   Socioeconomic History  . Marital status: Single    Spouse name: Not on file  . Number of children: 4  . Years of education: Not  on file  . Highest education level: Not on file  Occupational History  . Occupation: UNEMPLOYWED SINCE 10/2010  Tobacco Use  . Smoking status: Never Smoker  . Smokeless tobacco: Never Used  Vaping Use  . Vaping Use: Never used  Substance and Sexual Activity  . Alcohol use: No  . Drug use: No  . Sexual activity: Not Currently    Birth control/protection: I.U.D., Condom  Other Topics Concern  . Not on file  Social History Narrative  . Not on file   Social Determinants of Health   Financial Resource Strain: Not on file  Food Insecurity: Not on file  Transportation Needs: Not on file  Physical  Activity: Not on file  Stress: Not on file  Social Connections: Not on file  Intimate Partner Violence: Not on file    Review of Systems: See HPI, otherwise negative ROS  Physical Exam: BP (!) 127/100   Pulse (!) 103   Resp 17   LMP 01/12/2021   SpO2 96%  General:   Alert, obese pleasant and cooperative in NAD Mouth:  No deformity or lesions. Neck:  Supple; no masses or thyromegaly. No significant cervical adenopathy. Lungs:  Clear throughout to auscultation.   No wheezes, crackles, or rhonchi. No acute distress. Heart:  Regular rate and rhythm; no murmurs, clicks, rubs,  or gallops. Abdomen: Non-distended, normal bowel sounds.  Soft and nontender without appreciable mass or hepatosplenomegaly.   Impression/Plan: 45 year old lady here for basically a screening colonoscopy.  Her prior history of rectal bleeding vomiting diarrhea but she symptoms are not active now.  I have offered the patient a screening colonoscopy today. The risks, benefits, limitations, alternatives and imponderables have been reviewed with the patient. Questions have been answered. All parties are agreeable.     Notice: This dictation was prepared with Dragon dictation along with smaller phrase technology. Any transcriptional errors that result from this process are unintentional and may not be corrected upon review.

## 2021-01-27 ENCOUNTER — Encounter: Payer: Self-pay | Admitting: Internal Medicine

## 2021-01-27 LAB — SURGICAL PATHOLOGY

## 2021-02-02 ENCOUNTER — Encounter (HOSPITAL_COMMUNITY): Payer: Self-pay | Admitting: Internal Medicine

## 2021-02-09 ENCOUNTER — Other Ambulatory Visit: Payer: Self-pay | Admitting: Family Medicine

## 2021-02-09 ENCOUNTER — Other Ambulatory Visit: Payer: Self-pay | Admitting: Nurse Practitioner

## 2021-02-09 DIAGNOSIS — K921 Melena: Secondary | ICD-10-CM

## 2021-02-09 DIAGNOSIS — R197 Diarrhea, unspecified: Secondary | ICD-10-CM

## 2021-02-09 DIAGNOSIS — E559 Vitamin D deficiency, unspecified: Secondary | ICD-10-CM

## 2021-02-09 DIAGNOSIS — R1030 Lower abdominal pain, unspecified: Secondary | ICD-10-CM

## 2021-02-13 ENCOUNTER — Ambulatory Visit: Payer: Medicaid Other | Admitting: Urology

## 2021-02-13 DIAGNOSIS — R39198 Other difficulties with micturition: Secondary | ICD-10-CM

## 2021-02-23 ENCOUNTER — Encounter: Payer: Self-pay | Admitting: Internal Medicine

## 2021-03-10 ENCOUNTER — Encounter: Payer: Self-pay | Admitting: Family Medicine

## 2021-03-11 ENCOUNTER — Other Ambulatory Visit: Payer: Self-pay | Admitting: Family Medicine

## 2021-03-13 ENCOUNTER — Other Ambulatory Visit: Payer: Self-pay | Admitting: Family Medicine

## 2021-03-13 NOTE — Telephone Encounter (Signed)
Should she be on both the HCTZ 25 and triamterene HCTZ?

## 2021-03-18 ENCOUNTER — Encounter: Payer: Self-pay | Admitting: Gastroenterology

## 2021-04-02 ENCOUNTER — Ambulatory Visit (INDEPENDENT_AMBULATORY_CARE_PROVIDER_SITE_OTHER): Payer: 59 | Admitting: Family Medicine

## 2021-04-02 ENCOUNTER — Encounter: Payer: Self-pay | Admitting: Family Medicine

## 2021-04-02 ENCOUNTER — Other Ambulatory Visit: Payer: Self-pay

## 2021-04-02 VITALS — BP 140/98 | HR 107 | Resp 16 | Ht 67.0 in | Wt 338.0 lb

## 2021-04-02 DIAGNOSIS — N3001 Acute cystitis with hematuria: Secondary | ICD-10-CM | POA: Diagnosis not present

## 2021-04-02 DIAGNOSIS — Z13220 Encounter for screening for lipoid disorders: Secondary | ICD-10-CM

## 2021-04-02 DIAGNOSIS — F322 Major depressive disorder, single episode, severe without psychotic features: Secondary | ICD-10-CM | POA: Diagnosis not present

## 2021-04-02 DIAGNOSIS — R3912 Poor urinary stream: Secondary | ICD-10-CM

## 2021-04-02 DIAGNOSIS — F411 Generalized anxiety disorder: Secondary | ICD-10-CM | POA: Diagnosis not present

## 2021-04-02 DIAGNOSIS — H547 Unspecified visual loss: Secondary | ICD-10-CM

## 2021-04-02 DIAGNOSIS — Z1231 Encounter for screening mammogram for malignant neoplasm of breast: Secondary | ICD-10-CM

## 2021-04-02 DIAGNOSIS — I10 Essential (primary) hypertension: Secondary | ICD-10-CM

## 2021-04-02 DIAGNOSIS — R7303 Prediabetes: Secondary | ICD-10-CM

## 2021-04-02 LAB — POCT URINALYSIS DIP (CLINITEK)
Bilirubin, UA: NEGATIVE
Glucose, UA: NEGATIVE mg/dL
Ketones, POC UA: NEGATIVE mg/dL
Nitrite, UA: NEGATIVE
Spec Grav, UA: 1.015 (ref 1.010–1.025)
Urobilinogen, UA: 1 E.U./dL
pH, UA: 7 (ref 5.0–8.0)

## 2021-04-02 MED ORDER — SULFAMETHOXAZOLE-TRIMETHOPRIM 800-160 MG PO TABS: 1.0000 | ORAL_TABLET | Freq: Two times a day (BID) | ORAL | 0 refills | Status: DC

## 2021-04-02 MED ORDER — BUSPIRONE HCL 10 MG PO TABS
ORAL_TABLET | ORAL | 3 refills | Status: DC
Start: 2021-04-02 — End: 2021-04-27

## 2021-04-02 MED ORDER — TRIAMTERENE-HCTZ 75-50 MG PO TABS: 1.0000 | ORAL_TABLET | Freq: Every day | ORAL | 3 refills | Status: DC

## 2021-04-02 NOTE — Assessment & Plan Note (Addendum)
Refer to therapy aggravated by failing health and chronic pain, and inability to care for herself and her daughter, not suicidal or homicidal

## 2021-04-02 NOTE — Progress Notes (Signed)
Jennifer [name]     MRN: [id number]      DOB: 04/08/1976   HPI [name] is here with uncontrolled back pain radiaiting to left leg, has MRI documenting nerve pressure and arthritis in back Left knee pain is unbearable, she is limping , has had injections in both knees Has fallenn because of knee and back pain , last month, approx 4 times since this year Handicap sticker needed Nees to lose weight before knee surgery can be done, now willing to look at surgical option, increasingly disabled due to weight and arthritis C/o bladder pressure and a feeling of incomplete emptying C/o film over eyes with poor vision, needs eye eval C/o uncontrolled depression and anxiety, interested in therapy, not suicidal or homicidal ROS Denies recent fever or chills. Denies sinus pressure, nasal congestion, ear pain or sore throat. Denies chest congestion, productive cough or wheezing. Denies chest pains, palpitations and leg swelling Denies abdominal pain, nausea, vomiting,diarrhea or constipation.   . Denies headaches, seizures, numbness, or tingling.  Denies skin break down or rash.   PE  BP (!) 140/98   Pulse (!) 107   Resp 16   Wt (!) 338 lb (153.3 kg)   SpO2 96%   BMI 52.94 kg/m   Patient alert and oriented and in no cardiopulmonary distress.  HEENT: No facial asymmetry, EOMI,     Neck supple .  Chest: Clear to auscultation bilaterally.  CVS: S1, S2 no murmurs, no S3.Regular rate.  ABD: Soft non tender.   Ext: No edema  MS: decreased ROM spine,  hips and knees.  Skin: Intact, no ulcerations or rash noted.  Psych: Good eye contact, flat affect.Tearful at times. Memory intact not anxious or depressed appearing.  CNS: CN 2-12 intact, power,  normal throughout.no focal deficits noted.   Assessment & Plan  Depression, major, single episode, severe (HCC) Refer to therapy aggravated by failing health and chronic pain, and inability to care for herself and her daughter, not  suicidal or homicidal  GAD (generalized anxiety disorder) Refer to therapy and inc dose of buspar  UTI (urinary tract infection) Symptomatic with abn UA, treat presumptively and f/u c/s  Acute cystitis with hematuria symptomaic with abn UA, treat presumptively and f/u C/s  Morbid obesity (HCC)  Patient re-educated about  the importance of commitment to a  minimum of 150 minutes of exercise per week as able.  The importance of healthy food choices with portion control discussed, as well as eating regularly and within a 12 hour window most days. The need to choose "clean , green" food 50 to 75% of the time is discussed, as well as to make water the primary drink and set a goal of 64 ounces water daily.    Weight /BMI 04/02/2021 01/22/2021 12/16/2020  WEIGHT 338 lb 322 lb 342 lb  HEIGHT - 5\' 7"  5\' 7"   BMI 52.94 kg/m2 50.43 kg/m2 53.56 kg/m2      Poor urinary stream Refer to urology for eval  Reduced vision Reports reduced vision with a film over her eye, refer opthalmology

## 2021-04-02 NOTE — Patient Instructions (Addendum)
F/u to re evaluate blood pressure , depression and anxiety in 4 to 6 weeks, call if you need me sooner   Fasting lipid, cmp and EGFr, hBA1C, TSH And vit D 5 to 7 days before next visit.  Mammogram to be scheduled   Antibiotic prescribed for presummed UTI  Higher dose of triamterene , start tomorrow,(if you have already taken medication for today) new  Dose to be collected today  Higher dose of buspar start today  You are referred to therapist  You are referred for eye exam  You are referred for bariatric surgery  Careful not to fall.  Speak with orthopedics re pain management  Handicap sticker today  Thanks for choosing [facility], we consider it a privelige to serve you.

## 2021-04-03 ENCOUNTER — Encounter: Payer: Self-pay | Admitting: Family Medicine

## 2021-04-03 DIAGNOSIS — H547 Unspecified visual loss: Secondary | ICD-10-CM | POA: Insufficient documentation

## 2021-04-03 NOTE — Assessment & Plan Note (Signed)
symptomaic with abn UA, treat presumptively and f/u C/s

## 2021-04-03 NOTE — Assessment & Plan Note (Signed)
Refer to therapy and inc dose of buspar

## 2021-04-03 NOTE — Assessment & Plan Note (Signed)
  Patient re-educated about  the importance of commitment to a  minimum of 150 minutes of exercise per week as able.  The importance of healthy food choices with portion control discussed, as well as eating regularly and within a 12 hour window most days. The need to choose "clean , green" food 50 to 75% of the time is discussed, as well as to make water the primary drink and set a goal of 64 ounces water daily.    Weight /BMI 04/02/2021 01/22/2021 12/16/2020  WEIGHT 338 lb 322 lb 342 lb  HEIGHT - 5\' 7"  5\' 7"   BMI 52.94 kg/m2 50.43 kg/m2 53.56 kg/m2

## 2021-04-03 NOTE — Assessment & Plan Note (Signed)
Reports reduced vision with a film over her eye, refer opthalmology

## 2021-04-03 NOTE — Assessment & Plan Note (Signed)
Symptomatic with abn UA, treat presumptively and f/u c/s

## 2021-04-03 NOTE — Assessment & Plan Note (Signed)
Refer to urology for eval

## 2021-04-04 LAB — URINE CULTURE

## 2021-04-07 ENCOUNTER — Ambulatory Visit: Payer: 59

## 2021-04-07 ENCOUNTER — Telehealth (INDEPENDENT_AMBULATORY_CARE_PROVIDER_SITE_OTHER): Payer: Self-pay | Admitting: Licensed Clinical Social Worker

## 2021-04-07 ENCOUNTER — Other Ambulatory Visit: Payer: Self-pay

## 2021-04-07 DIAGNOSIS — F322 Major depressive disorder, single episode, severe without psychotic features: Secondary | ICD-10-CM

## 2021-04-07 DIAGNOSIS — F411 Generalized anxiety disorder: Secondary | ICD-10-CM

## 2021-04-08 ENCOUNTER — Telehealth: Payer: Self-pay | Admitting: Licensed Clinical Social Worker

## 2021-04-08 DIAGNOSIS — F411 Generalized anxiety disorder: Secondary | ICD-10-CM

## 2021-04-08 DIAGNOSIS — F322 Major depressive disorder, single episode, severe without psychotic features: Secondary | ICD-10-CM

## 2021-04-08 NOTE — BH Specialist Note (Signed)
Virtual Behavioral Health Treatment Plan Team Note  MRN: [id number] NAME: Jennifer [name]  DATE: 04/09/21  Start time:  3p End time:  315p Total time:  15 min  Total number of Virtual [place] Treatment Team Plan encounters: 1/4  Treatment Team Attendees: Royal Piedra, LCSW; Dr. Modesta Messing, Psychiatrist  Diagnoses:    ICD-10-CM   1. Depression, major, single episode, severe ([place])  F32.2   2. GAD (generalized anxiety disorder)  F41.1     Goals, Interventions and Follow-up Plan Goals: Increase healthy adjustment to current life circumstances Increase motivation to adhere to plan of care Interventions: Motivational Interviewing Mindfulness or Relaxation Training Medication Management Recommendations: no change given that Buspar has been up titrated will consider up titration of Venlafaxine if no change in mood symptoms in up titration of Buspar Follow-up Plan: weekly VBH follow up  History of the present illness Presenting Problem/Current Symptoms: symptoms of depression and anxiety  Psychiatric History  Depression: Yes Anxiety: Yes Mania: No Psychosis: No PTSD symptoms: No  Past Psychiatric History/Hospitalization(s): Hospitalization for psychiatric illness: No Prior Suicide Attempts: No Prior Self-injurious behavior: No  Psychosocial stressors Flowsheet Row Virtual BH Phone Follow Up from 12/11/2018 in [facility]  Current Stressors --  [Wants to obtain a new job,  Family support has moved out of state. ]  Familial Stressors None  Sleep Decreased, Difficulty falling asleep  Appetite Decreased, Loss of appetite  Coping ability Deficient support system, Exhausted, Overwhelmed  Patient taking medications as prescribed Yes      Self-harm Behaviors Risk Assessment Flowsheet Row Virtual [place] Phone Follow Up from 12/11/2018 in [facility]  Self-harm risk factors --  [None Reported]  Have you recently had any thoughts about harming yourself? No       Screenings PHQ-9 Assessments:  Depression screen [facility] 2/9 04/02/2021 12/25/2020 12/16/2020  Decreased Interest 3 0 1  Down, Depressed, Hopeless 3 1 1   PHQ - 2 Score 6 1 2   Altered sleeping 2 1 1   Tired, decreased energy 3 1 1   Change in appetite 1 0 0  Feeling bad or failure about yourself  1 0 1  Trouble concentrating 0 0 0  Moving slowly or fidgety/restless 0 0 0  Suicidal thoughts 0 0 0  PHQ-9 Score 13 3 5   Difficult doing work/chores Somewhat difficult - Somewhat difficult  Some recent data might be hidden   GAD-7 Assessments:  GAD 7 : Generalized Anxiety Score 04/02/2021 06/04/2020 06/14/2019 12/11/2018  Nervous, Anxious, on Edge 3 2 3 2   Control/stop worrying 3 2 2 2   Worry too much - different things 3 1 2 2   Trouble relaxing 2 2 3 1   Restless 0 2 1 1   Easily annoyed or irritable 2 2 2  0  Afraid - awful might happen 3 1 1 1   Total GAD 7 Score 16 12 14 9   Anxiety Difficulty - Somewhat difficult Somewhat difficult Somewhat difficult    Past Medical History Past Medical History:  Diagnosis Date  . Abnormal vaginal Pap smear   . Alopecia   . Arthritis   . Depression   . Diabetes mellitus without complication (HCC)    prediabetic, takes no meds  . Diverticulosis   . Family history of diabetes mellitus   . GERD (gastroesophageal reflux disease)   . Hemorrhoids   . History of recurrent UTIs   . Hypertension   . Obesity     Vital signs: There were no vitals filed for this visit.  Allergies:  Allergies as of 04/08/2021  . (No Known Allergies)    Medication History Current medications:  Outpatient Encounter Medications as of 04/08/2021  Medication Sig  . acetaminophen (TYLENOL) 500 MG tablet Take 1,000 mg by mouth every 6 (six) hours as needed for mild pain or moderate pain.  . busPIRone (BUSPAR) 10 MG tablet Take one tablet by mouth three times daily for anxiety  . Cholecalciferol (D3-50) 1.25 MG (50000 UT) capsule TAKE 1 CAPSULE BY MOUTH ONE TIME PER WEEK  .  clotrimazole-betamethasone (LOTRISONE) cream APPLY TO AFFECTED AREA TWICE A DAY  . dicyclomine (BENTYL) 10 MG capsule TAKE 1 CAPSULE (10 MG TOTAL) BY MOUTH 2 (TWO) TIMES DAILY AS NEEDED FOR SPASMS.  . fluticasone (FLONASE) 50 MCG/ACT nasal spray Place 2 sprays into both nostrils daily. (Patient taking differently: Place 2 sprays into both nostrils daily as needed for allergies.)  . hydrOXYzine (ATARAX/VISTARIL) 50 MG tablet TAKE ONE TABLET AT BEDTIME FOR SLEEP (Patient taking differently: Take 50 mg by mouth at bedtime as needed (sleep).)  . meloxicam (MOBIC) 15 MG tablet Take 15 mg by mouth daily.  . norethindrone (INCASSIA) 0.35 MG tablet Take 1 tablet (0.35 mg total) by mouth daily.  Marland Kitchen sulfamethoxazole-trimethoprim (BACTRIM DS) 800-160 MG tablet Take 1 tablet by mouth 2 (two) times daily.  Marland Kitchen triamterene-hydrochlorothiazide (MAXZIDE) 75-50 MG tablet Take 1 tablet by mouth daily.  Marland Kitchen venlafaxine XR (EFFEXOR-XR) 150 MG 24 hr capsule TAKE 1 CAPSULE BY MOUTH DAILY WITH BREAKFAST.   No facility-administered encounter medications on file as of 04/08/2021.     Scribe for Treatment Team: Lubertha South, LCSW

## 2021-04-08 NOTE — BH Specialist Note (Signed)
[place] Initial Clinical Assessment  MRN: [id number] NAME: Jennifer [name] Date: 04/08/21  Start time:   1130aEnd time:  12p Total time:  30 min Call number:  video visit  Type of Contact:  video Patient consent obtained:  yes Reason for Visit today:  initiate services  Treatment History Patient recently received Inpatient Treatment:  n/a  Facility/Program:    Date of discharge:   Patient currently being seen by therapist/psychiatrist:   Patient currently receiving the following services:    Past Psychiatric History/Hospitalization(s): Anxiety: No Bipolar Disorder: No Depression: Yes Mania: No Psychosis: No Schizophrenia: No Personality Disorder: No Hospitalization for psychiatric illness: No History of Electroconvulsive Shock Therapy: No Prior Suicide Attempts: No  Clinical Assessment:  PHQ-9 Assessments: Depression screen [facility] 2/9 04/02/2021 12/25/2020 12/16/2020  Decreased Interest 3 0 1  Down, Depressed, Hopeless 3 1 1   PHQ - 2 Score 6 1 2   Altered sleeping 2 1 1   Tired, decreased energy 3 1 1   Change in appetite 1 0 0  Feeling bad or failure about yourself  1 0 1  Trouble concentrating 0 0 0  Moving slowly or fidgety/restless 0 0 0  Suicidal thoughts 0 0 0  PHQ-9 Score 13 3 5   Difficult doing work/chores Somewhat difficult - Somewhat difficult  Some recent data might be hidden    GAD-7 Assessments: GAD 7 : Generalized Anxiety Score 04/02/2021 06/04/2020 06/14/2019 12/11/2018  Nervous, Anxious, on Edge 3 2 3 2   Control/stop worrying 3 2 2 2   Worry too much - different things 3 1 2 2   Trouble relaxing 2 2 3 1   Restless 0 2 1 1   Easily annoyed or irritable 2 2 2  0  Afraid - awful might happen 3 1 1 1   Total GAD 7 Score 16 12 14 9   Anxiety Difficulty - Somewhat difficult Somewhat difficult Somewhat difficult     Social Functioning Social maturity:  WNL Social judgement:  WNL  Stress Current stressors:  currently on short term  disability Familial stressors:  none Sleep:  fair Appetite:  poor Coping ability:overwhelmed   Patient taking medications as prescribed:    Current medications:  Outpatient Encounter Medications as of 04/08/2021  Medication Sig  . acetaminophen (TYLENOL) 500 MG tablet Take 1,000 mg by mouth every 6 (six) hours as needed for mild pain or moderate pain.  . busPIRone (BUSPAR) 10 MG tablet Take one tablet by mouth three times daily for anxiety  . Cholecalciferol (D3-50) 1.25 MG (50000 UT) capsule TAKE 1 CAPSULE BY MOUTH ONE TIME PER WEEK  . clotrimazole-betamethasone (LOTRISONE) cream APPLY TO AFFECTED AREA TWICE A DAY  . dicyclomine (BENTYL) 10 MG capsule TAKE 1 CAPSULE (10 MG TOTAL) BY MOUTH 2 (TWO) TIMES DAILY AS NEEDED FOR SPASMS.  . fluticasone (FLONASE) 50 MCG/ACT nasal spray Place 2 sprays into both nostrils daily. (Patient taking differently: Place 2 sprays into both nostrils daily as needed for allergies.)  . hydrOXYzine (ATARAX/VISTARIL) 50 MG tablet TAKE ONE TABLET AT BEDTIME FOR SLEEP (Patient taking differently: Take 50 mg by mouth at bedtime as needed (sleep).)  . meloxicam (MOBIC) 15 MG tablet Take 15 mg by mouth daily.  . norethindrone (INCASSIA) 0.35 MG tablet Take 1 tablet (0.35 mg total) by mouth daily.  Marland Kitchen sulfamethoxazole-trimethoprim (BACTRIM DS) 800-160 MG tablet Take 1 tablet by mouth 2 (two) times daily.  Marland Kitchen triamterene-hydrochlorothiazide (MAXZIDE) 75-50 MG tablet Take 1 tablet by mouth daily.  Marland Kitchen venlafaxine XR (EFFEXOR-XR) 150 MG 24  hr capsule TAKE 1 CAPSULE BY MOUTH DAILY WITH BREAKFAST.   No facility-administered encounter medications on file as of 04/08/2021.    Self-harm Behaviors Risk Assessment Self-harm risk factors:   Patient endorses recent thoughts of harming self:    [place] Suicide Severity Rating Scale:  C-SRSS 08/15/2018 09/29/2018 12/11/2018 06/14/2019 01/22/2021 12-Feb-2021  1. Wish to be Dead No No No No No No  2. Suicidal Thoughts No No No No No No  3.  Suicidal Thoughts with Method Without Specific Plan or Intent to Act - - - No - -  4. Suicidal Intent Without Specific Plan - - - No - -  5. Suicide Intent with Specific Plan - - - No - -  6. Suicide Behavior Question No No No No No No  7. How long ago did you do any of these? - - - Within the last three months - -    Danger to Others Risk Assessment Danger to others risk factors:   Patient endorses recent thoughts of harming others:    Dynamic Appraisal of Situational Aggression (DASA): No flowsheet data found.  Substance Use Assessment Patient recently consumed alcohol:    Alcohol Use Disorder Identification Test (AUDIT):  Alcohol Use Disorder Test (AUDIT) 08/15/2018 09/29/2018 12/11/2018 04/06/2021  1. How often do you have a drink containing alcohol? 1 0 0 0  2. How many drinks containing alcohol do you have on a typical day when you are drinking? 0 0 0 -  3. How often do you have six or more drinks on one occasion? 0 0 0 0  AUDIT-C Score 1 0 0 -  Alcohol Brief Interventions/Follow-up AUDIT Score <7 follow-up not indicated AUDIT Score <7 follow-up not indicated AUDIT Score <7 follow-up not indicated -   Patient recently used drugs:    Opioid Risk Assessment:  Patient is concerned about dependence or abuse of substances:    ASAM Multidimensional Assessment Summary:  Dimension 1:    Dimension 1 Rating:    Dimension 2:    Dimension 2 Rating:    Dimension 3:    Dimension 3 Rating:    Dimension 4:    Dimension 4 Rating:    Dimension 5:    Dimension 5 Rating:    Dimension 6:    Dimension 6 Rating:   ASAM's Severity Rating Score:   ASAM Recommended Level of Treatment:     Goals, Interventions and Follow-up Plan Goals: Increase healthy adjustment to current life circumstances and Increase motivation to adhere to plan of care Interventions: Motivational Interviewing and Mindfulness or Relaxation Training Follow-up Plan: weekly VBH follow up  Summary of Clinical  Assessment Summary: Jennifer [name] is a 45 yr old that was referred by her PCP due to her depression. Jennifer [name] reports crying spells and sadness daily.  She reports panic attacks and feeling on edge several times per day. She states that she works at [employer] and is currently having difficulty with her medical diagnosis, chronic pain.  She is having difficulty with caring for herself and her teenage daughter.  She is currently taking Buspar, as directed. Scheduled for weekly VBH visits   Lubertha South, LCSW

## 2021-04-09 NOTE — Progress Notes (Signed)
Virtual behavioral Health Initiative ([place]) Psychiatric Consultant Case Review   Jennifer [name] is a 45 y.o. year old female with a history of depression, anxiety. This is the third time she was referred for vBHI (she was signed off previously due to no shows to the program). Psychosocial stressors including pain, financial strain, history of complicated grief of loss of her sister and her friend in 2019. She has a teenage daughter. Buspar was uptitrated by PCP.    Assessment/Provisional Diagnosis # MDD, recurrent  Will work on improving adherence to treatment. Will consider uptitration of venlafaxine if limited benefit from uptitration of Buspar.   Recommendation - Continue venlafaxine 150 mg daily. Consider uptitration in the future - Continue Buspar 10 mg three times a day   - [place] specialist to offer behavioral activation, provide psycho education.   Thank you for your consult. We will continue to follow the patient. Please contact [place]  for any questions or concerns.   The above treatment considerations and suggestions are based on consultation with the [facility] specialist and/or PCP and a review of information available in the shared registry and the patient's [facility] Record (EHR). I have not personally examined the patient. All recommendations should be implemented with consideration of the patient's relevant prior history and current clinical status. Please feel free to call me with any questions about the care of this patient.

## 2021-04-10 ENCOUNTER — Telehealth: Payer: Self-pay

## 2021-04-10 NOTE — Telephone Encounter (Signed)
Pls let her know that urine culture showed NO infection so she does not need to take the septra. If she has already started it then stop. If no symptoms of yeasts infection no need for another tablet. If she does have symptoms pls send in flucoanzole 150 mg ONE only

## 2021-04-10 NOTE — Telephone Encounter (Signed)
Patient called said this medicine sulfamethoxazole-trimethoprim (BACTRIM DS) 800-160 MG tablet needs to know if it is for a bladder infection and does she need anything for yeast infection? But has not have one yet just double checking to make sure she will not get a yeast infection. Need clarification. Call back # [phone]

## 2021-04-10 NOTE — Telephone Encounter (Signed)
Left message

## 2021-04-14 ENCOUNTER — Telehealth (INDEPENDENT_AMBULATORY_CARE_PROVIDER_SITE_OTHER): Payer: Self-pay | Admitting: Licensed Clinical Social Worker

## 2021-04-14 ENCOUNTER — Other Ambulatory Visit: Payer: Self-pay

## 2021-04-14 DIAGNOSIS — Z5329 Procedure and treatment not carried out because of patient's decision for other reasons: Secondary | ICD-10-CM

## 2021-04-14 NOTE — Telephone Encounter (Signed)
Pt informed. She does not currently feel like she has a yeast infection.

## 2021-04-15 ENCOUNTER — Telehealth: Payer: Self-pay | Admitting: Licensed Clinical Social Worker

## 2021-04-15 ENCOUNTER — Encounter: Payer: Self-pay | Admitting: Family Medicine

## 2021-04-15 NOTE — Telephone Encounter (Signed)
Unable to leave message; will call again at a later date

## 2021-04-16 ENCOUNTER — Ambulatory Visit: Payer: 59 | Admitting: Nurse Practitioner

## 2021-04-17 ENCOUNTER — Ambulatory Visit: Payer: 59 | Admitting: Gastroenterology

## 2021-04-22 ENCOUNTER — Telehealth (HOSPITAL_COMMUNITY): Payer: Self-pay | Admitting: Psychiatry

## 2021-04-22 ENCOUNTER — Other Ambulatory Visit: Payer: Self-pay

## 2021-04-22 ENCOUNTER — Telehealth (INDEPENDENT_AMBULATORY_CARE_PROVIDER_SITE_OTHER): Payer: 59 | Admitting: Licensed Clinical Social Worker

## 2021-04-22 DIAGNOSIS — F411 Generalized anxiety disorder: Secondary | ICD-10-CM

## 2021-04-22 DIAGNOSIS — F322 Major depressive disorder, single episode, severe without psychotic features: Secondary | ICD-10-CM

## 2021-04-22 NOTE — Telephone Encounter (Signed)
D:  Jennifer Piedra, LCSW referred pt to MH-IOP.  A:  Placed call to orient pt. "My insurance company probably won't pay anything."   Encouraged pt to verify her benefits since pt is concerned about the cost.  Pt is to call case manager back afterwards to schedule an assessment.  R: Pt receptive.

## 2021-04-24 ENCOUNTER — Other Ambulatory Visit: Payer: Self-pay | Admitting: Family Medicine

## 2021-04-28 ENCOUNTER — Telehealth: Payer: Self-pay | Admitting: Licensed Clinical Social Worker

## 2021-04-28 ENCOUNTER — Ambulatory Visit
Admission: EM | Admit: 2021-04-28 | Discharge: 2021-04-28 | Disposition: A | Discharge status: Home / Self Care | Payer: 59 | Attending: Family Medicine | Admitting: Family Medicine

## 2021-04-28 ENCOUNTER — Encounter: Payer: Self-pay | Admitting: Emergency Medicine

## 2021-04-28 DIAGNOSIS — R300 Dysuria: Secondary | ICD-10-CM | POA: Insufficient documentation

## 2021-04-28 DIAGNOSIS — F411 Generalized anxiety disorder: Secondary | ICD-10-CM

## 2021-04-28 DIAGNOSIS — F322 Major depressive disorder, single episode, severe without psychotic features: Secondary | ICD-10-CM

## 2021-04-28 DIAGNOSIS — N3090 Cystitis, unspecified without hematuria: Secondary | ICD-10-CM | POA: Insufficient documentation

## 2021-04-28 LAB — POCT URINALYSIS DIP (MANUAL ENTRY)
Glucose, UA: NEGATIVE mg/dL
Nitrite, UA: NEGATIVE
Spec Grav, UA: 1.02 (ref 1.010–1.025)
Urobilinogen, UA: 1 E.U./dL
pH, UA: 6 (ref 5.0–8.0)

## 2021-04-28 LAB — POCT URINE PREGNANCY: Preg Test, Ur: NEGATIVE

## 2021-04-28 MED ORDER — CEPHALEXIN 500 MG PO CAPS: 500.0000 mg | ORAL_CAPSULE | Freq: Two times a day (BID) | ORAL | 0 refills | Status: DC

## 2021-04-28 MED ORDER — FLUCONAZOLE 150 MG PO TABS: ORAL_TABLET | ORAL | 0 refills | Status: DC

## 2021-04-28 NOTE — ED Provider Notes (Signed)
[place]    ASSESSMENT & PLAN:  1. Cystitis   2. Dysuria    Begin: Meds ordered this encounter  Medications   cephALEXin (KEFLEX) 500 MG capsule    Sig: Take 1 capsule (500 mg total) by mouth 2 (two) times daily.    Dispense:  10 capsule    Refill:  0    No signs of pyelonephritis. Urine culture sent.   Recommend:  Follow-up Information     [name], MD.   Specialty: Family Medicine Why: If worsening or failing to improve as anticipated. Contact information: [address] [place] [id number] [phone]                  Outlined signs and symptoms indicating need for more acute intervention. Patient verbalized understanding. After Visit Summary given.  SUBJECTIVE:  Jennifer [name] is a 45 y.o. female who complains of urinary frequency, urgency and dysuria for the past week or so. Without associated flank pain, fever, chills, vaginal discharge or bleeding. Mild lower abd cramping at times. Gross hematuria: not present. No specific aggravating or alleviating factors reported. No LE edema. Normal PO intake without n/v/d. Without specific abdominal pain. Ambulatory without difficulty. No tx PTA.  LMP: Patient's last menstrual period was 04/15/2021.   OBJECTIVE:  Vitals:   04/28/21 1420  BP: 138/69  Pulse: 98  Resp: 19  Temp: (!) 97.5 F (36.4 C)  TempSrc: Temporal  SpO2: 94%   General appearance: alert; no distress Lungs: unlabored respirations Abdomen: soft, NT GU: deferred Back: no CVA tenderness Extremities: no edema; symmetrical with no gross deformities Skin: warm and dry Neurologic: normal gait Psychological: alert and cooperative; normal mood and affect  Labs Reviewed  POCT URINALYSIS DIP (MANUAL ENTRY) - Abnormal; Notable for the following components:      Result Value   Clarity, UA cloudy (*)    Bilirubin, UA small (*)    Ketones, POC UA trace (5) (*)    Blood, UA large (*)    Protein Ur,  POC trace (*)    Leukocytes, UA Large (3+) (*)    All other components within normal limits  URINE CULTURE  POCT URINE PREGNANCY    No Known Allergies  Past Medical History:  Diagnosis Date   Abnormal vaginal Pap smear    Alopecia    Arthritis    Depression    Diabetes mellitus without complication (HCC)    prediabetic, takes no meds   Diverticulosis    Family history of diabetes mellitus    GERD (gastroesophageal reflux disease)    Hemorrhoids    History of recurrent UTIs    Hypertension    Obesity    Social History   Socioeconomic History   Marital status: Single    Spouse name: Not on file   Number of children: 4   Years of education: Not on file   Highest education level: Some college, no degree  Occupational History   Occupation: UNEMPLOYWED SINCE 10/2010  Tobacco Use   Smoking status: Never   Smokeless tobacco: Never  Vaping Use   Vaping Use: Never used  Substance and Sexual Activity   Alcohol use: No   Drug use: No   Sexual activity: Not Currently    Birth control/protection: I.U.D., Condom  Other Topics Concern   Not on file  Social History Narrative   Not on file   Social Determinants of Health   Financial Resource Strain: Medium Risk  Difficulty of Paying Living Expenses: Somewhat hard  Food Insecurity: Food Insecurity Present   Worried About Charity fundraiser in the Last Year: Sometimes true   Ran Out of Food in the Last Year: Never true  Transportation Needs: Unmet Transportation Needs   Lack of Transportation (Medical): Yes   Lack of Transportation (Non-Medical): Yes  Physical Activity: Unknown   Days of Exercise per Week: 0 days   Minutes of Exercise per Session: Not on file  Stress: Stress Concern Present   Feeling of Stress : Rather much  Social Connections: Moderately Isolated   Frequency of Communication with Friends and Family: Twice a week   Frequency of Social Gatherings with Friends and Family: Once a week   Attends  Religious Services: Never   Marine scientist or Organizations: Yes   Attends Archivist Meetings: Never   Marital Status: Never married  Human resources officer Violence: Not on file   Family History  Adopted: Yes  Problem Relation Age of Onset   Hypertension Mother    Diabetes Mother    Other Mother        cholangiocarcinoma, age 21, deceased   Colon cancer Neg Hx         Vanessa Kick, MD 04/28/21 1452

## 2021-04-28 NOTE — ED Triage Notes (Addendum)
Lower abd pain, cramping and burning with urination x 10 days.  Is supposed to see a urologist on Monday but states she is unable to take the pain that long.

## 2021-04-28 NOTE — BH Specialist Note (Signed)
Virtual Behavioral Health Treatment Plan Team Note  MRN: [id number] NAME: Jennifer [name]  DATE: 04/28/21  Start time:   240pEnd time:  245p Total time:  5 min  Total number of Virtual [place] Treatment Team Plan encounters: 2/4  Treatment Team Attendees: Royal Piedra, LCSW & Dr. Modesta Messing, Psychiatrist  Diagnoses:    ICD-10-CM   1. Depression, major, single episode, severe ([place])  F32.2     2. GAD (generalized anxiety disorder)  F41.1       Goals, Interventions and Follow-up Plan Goals: Increase healthy adjustment to current life circumstances Interventions: Motivational Interviewing Solution-Focused Strategies Mindfulness or Artist Activation Medication Management Recommendations: continue the current medication given there is questionable adherence. We will work on medication adherence. Follow-up Plan: Refer to [facility]  History of the present illness Presenting Problem/Current Symptoms: symptoms of her anxiety and depression  Psychiatric History  Depression: Yes Anxiety: No Mania: No Psychosis: No PTSD symptoms: No  Past Psychiatric History/Hospitalization(s): Hospitalization for psychiatric illness: No Prior Suicide Attempts: No Prior Self-injurious behavior: No  Psychosocial stressors Flowsheet Row Virtual [place] Phone Follow Up from 12/11/2018 in [facility]  Current Stressors --  [Wants to obtain a new job,  Family support has moved out of state. ]  Familial Stressors None  Sleep Decreased, Difficulty falling asleep  Appetite Decreased, Loss of appetite  Coping ability Deficient support system, Exhausted, Overwhelmed  Patient taking medications as prescribed Yes       Self-harm Behaviors Risk Assessment Flowsheet Row Virtual [place] Phone Follow Up from 12/11/2018 in [facility]  Self-harm risk factors --  [None Reported]  Have you recently had any thoughts about harming yourself? No        Screenings PHQ-9 Assessments:  Depression screen [facility] 2/9 04/02/2021 12/25/2020 12/16/2020  Decreased Interest 3 0 1  Down, Depressed, Hopeless 3 1 1   PHQ - 2 Score 6 1 2   Altered sleeping 2 1 1   Tired, decreased energy 3 1 1   Change in appetite 1 0 0  Feeling bad or failure about yourself  1 0 1  Trouble concentrating 0 0 0  Moving slowly or fidgety/restless 0 0 0  Suicidal thoughts 0 0 0  PHQ-9 Score 13 3 5   Difficult doing work/chores Somewhat difficult - Somewhat difficult  Some recent data might be hidden   GAD-7 Assessments:  GAD 7 : Generalized Anxiety Score 04/02/2021 06/04/2020 06/14/2019 12/11/2018  Nervous, Anxious, on Edge 3 2 3 2   Control/stop worrying 3 2 2 2   Worry too much - different things 3 1 2 2   Trouble relaxing 2 2 3 1   Restless 0 2 1 1   Easily annoyed or irritable 2 2 2  0  Afraid - awful might happen 3 1 1 1   Total GAD 7 Score 16 12 14 9   Anxiety Difficulty - Somewhat difficult Somewhat difficult Somewhat difficult    Past Medical History Past Medical History:  Diagnosis Date   Abnormal vaginal Pap smear    Alopecia    Arthritis    Depression    Diabetes mellitus without complication (HCC)    prediabetic, takes no meds   Diverticulosis    Family history of diabetes mellitus    GERD (gastroesophageal reflux disease)    Hemorrhoids    History of recurrent UTIs    Hypertension    Obesity     Vital signs: There were no vitals filed for this visit.  Allergies:  Allergies as of  04/28/2021   (No Known Allergies)    Medication History Current medications:  Outpatient Encounter Medications as of 04/28/2021  Medication Sig   acetaminophen (TYLENOL) 500 MG tablet Take 1,000 mg by mouth every 6 (six) hours as needed for mild pain or moderate pain.   busPIRone (BUSPAR) 10 MG tablet TAKE ONE TABLET BY MOUTH THREE TIMES DAILY FOR ANXIETY   cephALEXin (KEFLEX) 500 MG capsule Take 1 capsule (500 mg total) by mouth 2 (two) times daily.   Cholecalciferol  (D3-50) 1.25 MG (50000 UT) capsule TAKE 1 CAPSULE BY MOUTH ONE TIME PER WEEK   clotrimazole-betamethasone (LOTRISONE) cream APPLY TO AFFECTED AREA TWICE A DAY   dicyclomine (BENTYL) 10 MG capsule TAKE 1 CAPSULE (10 MG TOTAL) BY MOUTH 2 (TWO) TIMES DAILY AS NEEDED FOR SPASMS.   fluconazole (DIFLUCAN) 150 MG tablet Take one tablet by mouth as a single dose. May repeat in 3 days if symptoms persist.   fluticasone (FLONASE) 50 MCG/ACT nasal spray Place 2 sprays into both nostrils daily. (Patient taking differently: Place 2 sprays into both nostrils daily as needed for allergies.)   hydrOXYzine (ATARAX/VISTARIL) 50 MG tablet TAKE ONE TABLET AT BEDTIME FOR SLEEP (Patient taking differently: Take 50 mg by mouth at bedtime as needed (sleep).)   meloxicam (MOBIC) 15 MG tablet Take 15 mg by mouth daily.   norethindrone (INCASSIA) 0.35 MG tablet Take 1 tablet (0.35 mg total) by mouth daily.   sulfamethoxazole-trimethoprim (BACTRIM DS) 800-160 MG tablet Take 1 tablet by mouth 2 (two) times daily.   triamterene-hydrochlorothiazide (MAXZIDE) 75-50 MG tablet Take 1 tablet by mouth daily.   venlafaxine XR (EFFEXOR-XR) 150 MG 24 hr capsule TAKE 1 CAPSULE BY MOUTH DAILY WITH BREAKFAST.   No facility-administered encounter medications on file as of 04/28/2021.     Scribe for Treatment Team: Lubertha South, LCSW

## 2021-04-28 NOTE — Progress Notes (Signed)
[place] Follow Up Assessment  MRN: [id number] NAME: Skarlet LAJEANA STROUGH Date: 04/28/21  Start time: 12 End time: 1230p Total time: 30  Type of Contact: Follow up Call  Current concerns/stressors: financial concerns and lack of motivation  Screens/Assessment Tools:  PHQ-9 & GAD-7 Assessments: This is an evidence based assessment tool for depression and anxiety symptoms in adolescents and adults.  Score cut-off points for each section are as follows: 5-9: Mild, 10-14: Moderate, 15+: Severe  PHQ-9 for Depression = 15   GAD-7 for Anxiety = 10   How difficult have these problems made it for you to do your work, take care of things at home, or get along with other people? difficult  Functional Assessment:  Sleep: poor (sleeps most of the day) Appetite: overeats, "eating makes me feel better" Coping ability: overwhelmed Patient taking medications as prescribed:    Current medications:  Outpatient Encounter Medications as of 04/22/2021  Medication Sig   acetaminophen (TYLENOL) 500 MG tablet Take 1,000 mg by mouth every 6 (six) hours as needed for mild pain or moderate pain.   Cholecalciferol (D3-50) 1.25 MG (50000 UT) capsule TAKE 1 CAPSULE BY MOUTH ONE TIME PER WEEK   clotrimazole-betamethasone (LOTRISONE) cream APPLY TO AFFECTED AREA TWICE A DAY   dicyclomine (BENTYL) 10 MG capsule TAKE 1 CAPSULE (10 MG TOTAL) BY MOUTH 2 (TWO) TIMES DAILY AS NEEDED FOR SPASMS.   fluticasone (FLONASE) 50 MCG/ACT nasal spray Place 2 sprays into both nostrils daily. (Patient taking differently: Place 2 sprays into both nostrils daily as needed for allergies.)   hydrOXYzine (ATARAX/VISTARIL) 50 MG tablet TAKE ONE TABLET AT BEDTIME FOR SLEEP (Patient taking differently: Take 50 mg by mouth at bedtime as needed (sleep).)   meloxicam (MOBIC) 15 MG tablet Take 15 mg by mouth daily.   norethindrone (INCASSIA) 0.35 MG tablet Take 1 tablet (0.35 mg total) by mouth daily.    sulfamethoxazole-trimethoprim (BACTRIM DS) 800-160 MG tablet Take 1 tablet by mouth 2 (two) times daily.   triamterene-hydrochlorothiazide (MAXZIDE) 75-50 MG tablet Take 1 tablet by mouth daily.   venlafaxine XR (EFFEXOR-XR) 150 MG 24 hr capsule TAKE 1 CAPSULE BY MOUTH DAILY WITH BREAKFAST.   [DISCONTINUED] busPIRone (BUSPAR) 10 MG tablet Take one tablet by mouth three times daily for anxiety   No facility-administered encounter medications on file as of 04/22/2021.    Self-harm and/or Suicidal Behaviors Risk Assessment Self-harm risk factors: no Patient endorses recent self injurious thoughts and/or behaviors: No   Suicide ideations: No plan to harm self or others   Danger to Others Risk Assessment Danger to others risk factors: no Patient endorses recent thoughts of harming others: No    Substance Use Assessment Patient recently consumed alcohol: No  Patient recent12ly used drugs: No  Patient is concerned about dependence or abuse of substances: No    Goals, Interventions and Follow-up Plan Goals: Increase healthy adjustment to current life circumstances Interventions: Motivational Interviewing, Solution-Focused Strategies, Mindfulness or Psychologist, educational, and Ecologist of Clinical Assessment  Vidhi is a 45 year old woman referred by her PCP, Dr. Moshe Cipro.  Reagann continues to have difficulty with her depressed mood (lack of motivation, interest, sadness, crying spells and poor ADLs).  She is currently on short term disability due to her poor health.  She has a young child that she attempts to care for; daughter will be visiting with family during the summer.  Discussion of preparing fro the day and having a priority list.  Sharnee currently stressed about finances and requested referrals for community support. Discussion of [facility] and Patient is open to the service.   Follow-up Plan: Refer to [facility] [place], [place]

## 2021-04-30 LAB — URINE CULTURE

## 2021-05-04 ENCOUNTER — Ambulatory Visit (INDEPENDENT_AMBULATORY_CARE_PROVIDER_SITE_OTHER): Payer: 59 | Admitting: Urology

## 2021-05-04 ENCOUNTER — Other Ambulatory Visit: Payer: Self-pay

## 2021-05-04 ENCOUNTER — Encounter: Payer: Self-pay | Admitting: Urology

## 2021-05-04 VITALS — BP 137/92 | HR 97 | Ht 67.0 in | Wt 325.0 lb

## 2021-05-04 DIAGNOSIS — R3915 Urgency of urination: Secondary | ICD-10-CM

## 2021-05-04 DIAGNOSIS — R3129 Other microscopic hematuria: Secondary | ICD-10-CM | POA: Diagnosis not present

## 2021-05-04 LAB — MICROSCOPIC EXAMINATION
Epithelial Cells (non renal): >10 /hpf — AB (ref 0–10)
Renal Epithel, UA: NONE SEEN /hpf
WBC, UA: >30 /hpf — AB (ref 0–5)

## 2021-05-04 LAB — URINALYSIS, ROUTINE W REFLEX MICROSCOPIC
Bilirubin, UA: NEGATIVE
Glucose, UA: NEGATIVE
Ketones, UA: NEGATIVE
Nitrite, UA: NEGATIVE
Protein,UA: NEGATIVE
Specific Gravity, UA: 1.01 (ref 1.005–1.030)
Urobilinogen, Ur: 0.2 mg/dL (ref 0.2–1.0)
pH, UA: 7 (ref 5.0–7.5)

## 2021-05-04 MED ORDER — URIBEL 118 MG PO CAPS: 1.0000 | ORAL_CAPSULE | Freq: Two times a day (BID) | ORAL | 5 refills | Status: DC | PRN

## 2021-05-04 NOTE — Progress Notes (Signed)
05/04/2021 1:42 PM   Jennifer [name] 1976-04-19 [id number]  Referring provider: [name], MD [address] [place],  [place] [address]  Urgency   HPI: New pt -   1) gross hematuria, frequency, urgency - she's had "crampy" bladder discomfort before and after she voids for a few weeks.Stream usually good but sometimes slow. No dysuria but she noted pink colored. She took abx but stopped early. No prior bladder meds. No bladder or pelvic surgery. She typically drinks clear soda like Sprite. Some constipation - diverticulosis. BM relieves bladder pressure. No NG risk. She has DM, but no meds.   She has knee and back issues.   UA today with mod bacteria, 11-30 rbcs. Non-smoker. No chemo, xrt or occupational exposure. Never had a kidney stones.    PMH: Past Medical History:  Diagnosis Date   Abnormal vaginal Pap smear    Alopecia    Arthritis    Depression    Diabetes mellitus without complication (HCC)    prediabetic, takes no meds   Diverticulosis    Family history of diabetes mellitus    GERD (gastroesophageal reflux disease)    Hemorrhoids    History of recurrent UTIs    Hypertension    Obesity     Surgical History: Past Surgical History:  Procedure Laterality Date   APPENDECTOMY     COLONOSCOPY N/A 03/13/2014   Dr. Jena Gauss- grade 3 hemorrhoids, colonic diverticulosis bx= benign lymphoid polyp   COLONOSCOPY WITH PROPOFOL N/A 01/26/2021   Procedure: COLONOSCOPY WITH PROPOFOL;  Surgeon: Corbin Ade, MD;  Location: [facility];  Service: Endoscopy;  Laterality: N/A;  AM   KNEE ARTHROSCOPY     right   POLYPECTOMY  01/26/2021   Procedure: POLYPECTOMY;  Surgeon: Corbin Ade, MD;  Location: [facility];  Service: Endoscopy;;    Home Medications:  Allergies as of 05/04/2021   No Known Allergies      Medication List        Accurate as of May 04, 2021  1:42 PM. If you have any questions, ask your nurse or doctor.           acetaminophen 500 MG tablet Commonly known as: TYLENOL Take 1,000 mg by mouth every 6 (six) hours as needed for mild pain or moderate pain.   busPIRone 10 MG tablet Commonly known as: BUSPAR TAKE ONE TABLET BY MOUTH THREE TIMES DAILY FOR ANXIETY   cephALEXin 500 MG capsule Commonly known as: KEFLEX Take 1 capsule (500 mg total) by mouth 2 (two) times daily.   clotrimazole-betamethasone cream Commonly known as: LOTRISONE APPLY TO AFFECTED AREA TWICE A DAY   D3-50 1.25 MG (50000 UT) capsule Generic drug: Cholecalciferol TAKE 1 CAPSULE BY MOUTH ONE TIME PER WEEK   dicyclomine 10 MG capsule Commonly known as: BENTYL TAKE 1 CAPSULE (10 MG TOTAL) BY MOUTH 2 (TWO) TIMES DAILY AS NEEDED FOR SPASMS.   fluconazole 150 MG tablet Commonly known as: Diflucan Take one tablet by mouth as a single dose. May repeat in 3 days if symptoms persist.   fluticasone 50 MCG/ACT nasal spray Commonly known as: FLONASE Place 2 sprays into both nostrils daily. What changed:  when to take this reasons to take this   hydrOXYzine 50 MG tablet Commonly known as: ATARAX/VISTARIL TAKE ONE TABLET AT BEDTIME FOR SLEEP What changed: See the new instructions.   meloxicam 15 MG tablet Commonly known as: MOBIC Take 15 mg by mouth daily.  norethindrone 0.35 MG tablet Commonly known as: Incassia Take 1 tablet (0.35 mg total) by mouth daily.   sulfamethoxazole-trimethoprim 800-160 MG tablet Commonly known as: BACTRIM DS Take 1 tablet by mouth 2 (two) times daily.   triamterene-hydrochlorothiazide 75-50 MG tablet Commonly known as: Maxzide Take 1 tablet by mouth daily.   venlafaxine XR 150 MG 24 hr capsule Commonly known as: EFFEXOR-XR TAKE 1 CAPSULE BY MOUTH DAILY WITH BREAKFAST.        Allergies: No Known Allergies  Family History: Family History  Adopted: Yes  Problem Relation Age of Onset   Hypertension Mother    Diabetes Mother    Other Mother        cholangiocarcinoma, age 36,  deceased   Colon cancer Neg Hx     Social History:  reports that she has never smoked. She has never used smokeless tobacco. She reports that she does not drink alcohol and does not use drugs.   Physical Exam: LMP 04/15/2021   Constitutional:  Alert and oriented, No acute distress. HEENT: [place] AT, moist mucus membranes.  Trachea midline, no masses. Cardiovascular: No clubbing, cyanosis, or edema. Respiratory: Normal respiratory effort, no increased work of breathing. GI: Abdomen is soft, nontender, nondistended, no abdominal masses GU: No CVA tenderness Skin: No rashes, bruises or suspicious lesions. Neurologic: Grossly intact, no focal deficits, moving all 4 extremities. Psychiatric: Normal mood and affect.  Laboratory Data: Lab Results  Component Value Date   WBC 6.0 05/29/2020   HGB 14.1 05/29/2020   HCT 42.2 05/29/2020   MCV 91.5 05/29/2020   PLT 315 05/29/2020    Lab Results  Component Value Date   CREATININE 0.88 11/25/2020    No results found for: PSA  No results found for: TESTOSTERONE  Lab Results  Component Value Date   HGBA1C 6.4 (H) 09/04/2020    Urinalysis    Component Value Date/Time   COLORURINE YELLOW 07/27/2019 1000   APPEARANCEUR CLOUDY (A) 07/27/2019 1000   LABSPEC 1.021 07/27/2019 1000   PHURINE 6.0 07/27/2019 1000   GLUCOSEU NEGATIVE 07/27/2019 1000   HGBUR LARGE (A) 07/27/2019 1000   HGBUR large 11/17/2009 0945   BILIRUBINUR small (A) 04/28/2021 1433   BILIRUBINUR negative 12/16/2020 1421   KETONESUR trace (5) (A) 04/28/2021 1433   KETONESUR NEGATIVE 07/27/2019 1000   PROTEINUR trace (A) 04/28/2021 1433   PROTEINUR Negative 12/16/2020 1421   PROTEINUR 100 (A) 07/27/2019 1000   UROBILINOGEN 1.0 04/28/2021 1433   UROBILINOGEN 0.2 11/17/2009 0945   NITRITE Negative 04/28/2021 1433   NITRITE negative 12/16/2020 1421   NITRITE NEGATIVE 07/27/2019 1000   LEUKOCYTESUR Large (3+) (A) 04/28/2021 1433   LEUKOCYTESUR MODERATE (A) 07/27/2019  1000    Lab Results  Component Value Date   BACTERIA RARE (A) 07/27/2019      Assessment & Plan:    1) urgency - discussed nature r/b of trial of Uribel or anticholinergic.   2) microhematuria - check CT scan and cystoscopy - she elects to proceed.    No follow-ups on file.  Jerilee Field, MD  [facility]  [address] [place], [place] [address] [phone]

## 2021-05-04 NOTE — Progress Notes (Signed)
post void residual=105  Urological Symptom Review  Patient is experiencing the following symptoms: Frequent urination Hard to postpone urination Burning/pain with urination Get up at night to urinate Leakage of urine Trouble starting stream Blood in urine Urinary tract infection Vaginal bleeding (female only) Weak stream   Review of Systems  Gastrointestinal (upper)  : Nausea Indigestion/heartburn  Gastrointestinal (lower) : Diarrhea Constipation  Constitutional : Fever Night Sweats Fatigue  Skin: Negative for skin symptoms  Eyes: Negative for eye symptoms  Ear/Nose/Throat : Sinus problems  Hematologic/Lymphatic: Negative for Hematologic/Lymphatic symptoms  Cardiovascular : Negative for cardiovascular symptoms  Respiratory : Shortness of breath  Endocrine: Negative for endocrine symptoms  Musculoskeletal: Back pain Joint pain  Neurological: Negative for neurological symptoms  Psychologic: Anxiety

## 2021-05-06 ENCOUNTER — Ambulatory Visit (INDEPENDENT_AMBULATORY_CARE_PROVIDER_SITE_OTHER): Payer: 59 | Admitting: Family Medicine

## 2021-05-06 ENCOUNTER — Other Ambulatory Visit: Payer: Self-pay

## 2021-05-06 ENCOUNTER — Encounter: Payer: Self-pay | Admitting: Family Medicine

## 2021-05-06 VITALS — BP 130/86 | HR 96 | Resp 16 | Ht 67.0 in | Wt 337.0 lb

## 2021-05-06 DIAGNOSIS — I10 Essential (primary) hypertension: Secondary | ICD-10-CM | POA: Diagnosis not present

## 2021-05-06 DIAGNOSIS — Z13220 Encounter for screening for lipoid disorders: Secondary | ICD-10-CM

## 2021-05-06 DIAGNOSIS — F411 Generalized anxiety disorder: Secondary | ICD-10-CM

## 2021-05-06 DIAGNOSIS — F322 Major depressive disorder, single episode, severe without psychotic features: Secondary | ICD-10-CM

## 2021-05-06 DIAGNOSIS — Z599 Problem related to housing and economic circumstances, unspecified: Secondary | ICD-10-CM

## 2021-05-06 DIAGNOSIS — R7303 Prediabetes: Secondary | ICD-10-CM

## 2021-05-06 LAB — URINE CULTURE

## 2021-05-06 MED ORDER — BUSPIRONE HCL 15 MG PO TABS: 15.0000 mg | ORAL_TABLET | Freq: Three times a day (TID) | ORAL | 2 refills | Status: DC

## 2021-05-06 NOTE — Patient Instructions (Addendum)
F/U  in 8 weeks , call if you need me before  New higher dose of buspar 15 mg three times daily  We will follow up on your request for assistance through [place] at checkout  Labs today ordered in May, lipid, cmp and eGFr, hBA1C  Thanks for choosing [facility], we consider it a privelige to serve you.

## 2021-05-07 ENCOUNTER — Telehealth: Payer: Self-pay

## 2021-05-07 ENCOUNTER — Telehealth: Payer: Self-pay | Admitting: *Deleted

## 2021-05-07 DIAGNOSIS — R7303 Prediabetes: Secondary | ICD-10-CM

## 2021-05-07 LAB — LIPID PANEL
Chol/HDL Ratio: 3.1 ratio (ref 0.0–4.4)
Cholesterol, Total: 152 mg/dL (ref 100–199)
HDL: 49 mg/dL (ref >39)
LDL Chol Calc (NIH): 88 mg/dL (ref 0–99)
Triglycerides: 79 mg/dL (ref 0–149)
VLDL Cholesterol Cal: 15 mg/dL (ref 5–40)

## 2021-05-07 LAB — CMP14+EGFR
ALT: 21 IU/L (ref 0–32)
AST: 17 IU/L (ref 0–40)
Albumin/Globulin Ratio: 1.5 (ref 1.2–2.2)
Albumin: 4.3 g/dL (ref 3.8–4.8)
Alkaline Phosphatase: 164 IU/L — ABNORMAL HIGH (ref 44–121)
BUN/Creatinine Ratio: 15 (ref 9–23)
BUN: 13 mg/dL (ref 6–24)
Bilirubin Total: <0.2 mg/dL (ref 0.0–1.2)
CO2: 24 mmol/L (ref 20–29)
Calcium: 10 mg/dL (ref 8.7–10.2)
Chloride: 99 mmol/L (ref 96–106)
Creatinine, Ser: 0.85 mg/dL (ref 0.57–1.00)
Globulin, Total: 2.9 g/dL (ref 1.5–4.5)
Glucose: 88 mg/dL (ref 65–99)
Potassium: 4.7 mmol/L (ref 3.5–5.2)
Sodium: 138 mmol/L (ref 134–144)
Total Protein: 7.2 g/dL (ref 6.0–8.5)
eGFR: 86 mL/min/{1.73_m2} (ref >59)

## 2021-05-07 LAB — HEMOGLOBIN A1C
Est. average glucose Bld gHb Est-mCnc: 140 mg/dL
Hgb A1c MFr Bld: 6.5 % — ABNORMAL HIGH (ref 4.8–5.6)

## 2021-05-07 MED ORDER — METFORMIN HCL 500 MG PO TABS: 500.0000 mg | ORAL_TABLET | Freq: Every day | ORAL | 1 refills | Status: DC

## 2021-05-07 NOTE — Progress Notes (Signed)
Jennifer [name]     MRN: [id number]      DOB: [birth year]   HPI Jennifer [name] is here for follow up and re-evaluation of chronic medical conditions, medication management and review of any available recent lab and radiology data.  Preventive health is updated, specifically  Cancer screening and Immunization.   Questions or concerns regarding consultations or procedures which the PT has had in the interim are  addressed. The PT denies any adverse reactions to current medications since the last visit.  C/o increasing financial difficulty now that she is unable to work and is asking for assistance with this  ROS Denies recent fever or chills. Denies sinus pressure, nasal congestion, ear pain or sore throat. Denies chest congestion, productive cough or wheezing. Denies chest pains, palpitations and leg swelling Denies abdominal pain, nausea, vomiting,diarrhea or constipation.   Denies dysuria, frequency, hesitancy . C/o  joint pain, and limitation in mobility. Denies headaches, seizures,  C/o  depression, anxiety and insomnia.Not suicidal or homicidal Denies skin break down or rash.   PE  BP 130/86   Pulse 96   Resp 16   Ht 5\' 7"  (1.702 m)   Wt (!) 337 lb (152.9 kg)   LMP 04/15/2021   SpO2 96%   BMI 52.78 kg/m    Patient alert and oriented and in no cardiopulmonary distress.  HEENT: No facial asymmetry, EOMI,     Neck supple .  Chest: Clear to auscultation bilaterally.  CVS: S1, S2 no murmurs, no S3.Regular rate.  ABD: Soft non tender.   Ext: No edema  MS: decreased  ROM spine,  hips and knees.  Skin: Intact, no ulcerations or rash noted.  Psych: Good eye contact, normal affect. Memory intact mildly anxious and depressed appearing.  CNS: CN 2-12 intact, power,  normal throughout.no focal deficits noted.   Assessment & Plan  Depression, major, single episode, severe (HCC) Slightly improved, no change in medication, continue therapy  Essential  hypertension Controlled, no change in medication DASH diet and commitment to daily physical activity for a minimum of 30 minutes discussed and encouraged, as a part of hypertension management. The importance of attaining a healthy weight is also discussed.  BP/Weight 05/06/2021 05/04/2021 04/28/2021 04/02/2021 01/26/2021 01/22/2021 12/16/2020  Systolic BP 130 137 138 140 98 158 142  Diastolic BP 86 92 69 98 70 95 91  Wt. (Lbs) 337 325 - 338 - 322 342  BMI 52.78 50.9 - 52.94 - 50.43 53.56       Morbid obesity (HCC) Interested in surgery, completing survey, I recommended bypass over sleeve for her  Patient re-educated about  the importance of commitment to a  minimum of 150 minutes of exercise per week as able.  The importance of healthy food choices with portion control discussed, as well as eating regularly and within a 12 hour window most days. The need to choose "clean , green" food 50 to 75% of the time is discussed, as well as to make water the primary drink and set a goal of 64 ounces water daily.    Weight /BMI 05/06/2021 05/04/2021 04/02/2021  WEIGHT 337 lb 325 lb 338 lb  HEIGHT 5\' 7"  5\' 7"  5\' 7"   BMI 52.78 kg/m2 50.9 kg/m2 52.94 kg/m2      Prediabetes Patient educated about the importance of limiting  Carbohydrate intake , the need to commit to daily physical activity for a minimum of 30 minutes , and to commit weight loss. The fact that changes  in all these areas will reduce or eliminate all together the development of diabetes is stressed.  Deteiorated  Diabetic Labs Latest Ref Rng & Units 05/06/2021 11/25/2020 09/04/2020 09/02/2020 05/29/2020  HbA1c 4.8 - 5.6 % 6.5(H) - 6.4(H) - -  Chol 100 - 199 mg/dL 409 - - - 811  HDL >91 mg/dL 49 - - - 58  Calc LDL 0 - 99 mg/dL 88 - - - 62  Triglycerides 0 - 149 mg/dL 79 - - - 76  Creatinine 0.57 - 1.00 mg/dL 4.78 2.95 - 6.21 3.08   BP/Weight 05/06/2021 05/04/2021 04/28/2021 04/02/2021 01/26/2021 01/22/2021 12/16/2020  Systolic BP 130 137 138  140 98 158 142  Diastolic BP 86 92 69 98 70 95 91  Wt. (Lbs) 337 325 - 338 - 322 342  BMI 52.78 50.9 - 52.94 - 50.43 53.56   No flowsheet data found.    Financial difficulties Unemployed because of health and experiencing increased financial  challenges, requests help, referral entered  GAD (generalized anxiety disorder) Not at goal, still uncontrolled, increase buspar to 15 mg tid

## 2021-05-07 NOTE — Telephone Encounter (Signed)
-----   Message from [name], MD sent at 05/07/2021  7:51 AM EDT ----- Pls refer her for diabetic ed, dx obesity and type 2 diabetes

## 2021-05-07 NOTE — Chronic Care Management (AMB) (Signed)
Care Management   Note  05/07/2021 Name: Jennifer [name] MRN: [id number] DOB: 07/27/1976  Jennifer [name] is a 45 y.o. year old female who is a primary care patient of Lodema Hong Milus Mallick, MD. I reached out to Jennifer [name] by phone today in response to a referral sent by Jennifer [name]'s PCP, DR. Simpson.    Jennifer [name] was given information about care management services today including:  Care management services include personalized support from designated clinical staff supervised by her physician, including individualized plan of care and coordination with other care providers 24/7 contact phone numbers for assistance for urgent and routine care needs. The patient may stop care management services at any time by phone call to the office staff.  Patient agreed to services and verbal consent obtained.   Follow up plan: Telephone appointment with care management team member scheduled for: 05/08/2021  [facility] Guide, Embedded Care Coordination [facility] Management

## 2021-05-08 ENCOUNTER — Ambulatory Visit: Payer: 59 | Admitting: *Deleted

## 2021-05-08 ENCOUNTER — Telehealth: Payer: Self-pay

## 2021-05-08 DIAGNOSIS — Z599 Problem related to housing and economic circumstances, unspecified: Secondary | ICD-10-CM

## 2021-05-08 DIAGNOSIS — F322 Major depressive disorder, single episode, severe without psychotic features: Secondary | ICD-10-CM

## 2021-05-08 NOTE — Chronic Care Management (AMB) (Signed)
Chronic Care Management    Clinical Social Work Note  05/08/2021 Name: Jennifer [name] MRN: [id number] DOB: 1976/01/02  Jennifer [name] is a 45 y.o. year old female who is a primary care patient of Lodema Hong, Milus Mallick, MD. The CCM team was consulted to assist the patient with chronic disease management and/or care coordination needs related to: [employer] , Mental Health Counseling and Resources, and Financial Difficulties related to unemployment .   Engaged with patient by telephone for initial visit in response to provider referral for social work chronic care management and care coordination services.   Consent to Services:  The patient was given information about Chronic Care Management services, agreed to services, and gave verbal consent prior to initiation of services.  Please see initial visit note for detailed documentation.   Patient agreed to services and consent obtained.   Assessment: Review of patient past medical history, allergies, medications, and health status, including review of relevant consultants reports was performed today as part of a comprehensive evaluation and provision of chronic care management and care coordination services.     SDOH (Social Determinants of Health) assessments and interventions performed:  SDOH Interventions    Flowsheet Row Most Recent Value  SDOH Interventions   Depression Interventions/Treatment  Medication, Counseling, Currently on Treatment        Advanced Directives Status: Not addressed in this encounter.  CCM Care Plan  No Known Allergies  Outpatient Encounter Medications as of 05/08/2021  Medication Sig   busPIRone (BUSPAR) 15 MG tablet Take 1 tablet (15 mg total) by mouth 3 (three) times daily.   venlafaxine XR (EFFEXOR-XR) 150 MG 24 hr capsule TAKE 1 CAPSULE BY MOUTH DAILY WITH BREAKFAST.   acetaminophen (TYLENOL) 500 MG tablet Take 1,000 mg by mouth every 6 (six) hours as needed for mild pain or moderate pain.    Cholecalciferol (D3-50) 1.25 MG (50000 UT) capsule TAKE 1 CAPSULE BY MOUTH ONE TIME PER WEEK   clotrimazole-betamethasone (LOTRISONE) cream APPLY TO AFFECTED AREA TWICE A DAY   dicyclomine (BENTYL) 10 MG capsule TAKE 1 CAPSULE (10 MG TOTAL) BY MOUTH 2 (TWO) TIMES DAILY AS NEEDED FOR SPASMS.   fluticasone (FLONASE) 50 MCG/ACT nasal spray Place 2 sprays into both nostrils daily. (Patient taking differently: Place 2 sprays into both nostrils daily as needed for allergies.)   hydrOXYzine (ATARAX/VISTARIL) 50 MG tablet TAKE ONE TABLET AT BEDTIME FOR SLEEP (Patient taking differently: Take 50 mg by mouth at bedtime as needed (sleep).)   meloxicam (MOBIC) 15 MG tablet Take 15 mg by mouth daily.   metFORMIN (GLUCOPHAGE) 500 MG tablet Take 1 tablet (500 mg total) by mouth daily with breakfast.   Meth-Hyo-M Bl-Na Phos-Ph Sal (URIBEL) 118 MG CAPS Take 1 capsule (118 mg total) by mouth 2 (two) times daily as needed.   norethindrone (INCASSIA) 0.35 MG tablet Take 1 tablet (0.35 mg total) by mouth daily.   triamterene-hydrochlorothiazide (MAXZIDE) 75-50 MG tablet Take 1 tablet by mouth daily.   No facility-administered encounter medications on file as of 05/08/2021.    Patient Active Problem List   Diagnosis Date Noted   Reduced vision 04/03/2021   Hematochezia 09/09/2020   Abdominal pain 08/31/2020   Right wrist pain 07/21/2020   Unsteady gait 07/14/2020   Poor urinary stream 05/27/2020   Depression, major, single episode, severe (HCC) 06/18/2019   Environmental allergies 06/18/2019   Candidiasis of skin 01/22/2019   Back pain 11/28/2017   Essential hypertension 03/22/2016   Diverticulosis 11/23/2015  Hemorrhoids 03/26/2015   Acute cystitis with hematuria 12/21/2014   Insomnia due to other mental disorder 07/10/2014   GAD (generalized anxiety disorder) 07/10/2014   FHx: cancer of digestive organ 02/16/2014   Piles (hemorrhoids) 02/14/2014   Abnormal finding on Pap smear, HPV DNA positive  11/29/2013   Allergic rhinitis 02/12/2013   Prediabetes 03/26/2012   Dyspepsia 03/22/2012   TUBERCULOSIS 11/02/2010   Alopecia (capitis) totalis 11/02/2010   Chronic fatigue 07/23/2009   Morbid obesity (HCC) 11/24/2007   Depression with anxiety 11/24/2007    Conditions to be addressed/monitored: Anxiety, Depression, and chronic pain and immobility related to BMI, back and knees ; Financial constraints related to unemployment, Mental Health Concerns , and Lacks knowledge of community resources  Care Plan : LCSW Plan of Care  Updates made by Buck Mam, LCSW since 05/08/2021 12:00 AM     Problem: Financial strains due to pain and inabilty to work exacerbating depression and anxiety   Priority: High     Goal: Quality of Life Maintained   Note:   Evidence-based guidance:  Assess patient's thoughts about quality of life, goals and expectations, and dissatisfaction or desire to improve.  Identify issues of primary importance such as mental health, illness, exercise tolerance, pain, sexual function and intimacy, cognitive change, social isolation, finances and relationships.  Assess and monitor for signs/symptoms of psychosocial concerns, especially depression or ideations regarding harm to others or self; provide or refer for mental health services as needed.  Identify sensory issues that impact quality of life such as hearing loss, vision deficit; strategize ways to maintain or improve hearing, vision.  Promote access to services in the community to support independence such as support groups, home visiting programs, financial assistance, handicapped parking tags, durable medical equipment and emergency responder.  Promote activities to decrease social isolation such as group support or social, leisure and recreational activities, employment, use of social media; consider safety concerns about being out of home for activities.  Provide patient an opportunity to share by storytelling or  a "life review" to give positive meaning to life and to assist with coping and negative experiences.  Encourage patient to tap into hope to improve sense of self.  Counsel based on prognosis and as early as possible about end-of-life and palliative care; consider referral to palliative care provider.  Advocate for the development of palliative care plan that may include avoidance of unnecessary testing and intervention, symptom control, discontinuation of medications, hospice and organ donation.  Counsel as early as possible those with life-limiting chronic disease about palliative care; consider referral to palliative care provider.  Advocate for the development of palliative care plan.   Notes:       Follow Up Plan: [facility] Guide will reach out to patient for assistance with financial resources. and Appointment scheduled for SW follow up with client by phone on: 05/22/2021      Reece Levy MSW, LCSW Licensed Clinical Social Worker [facility] [phone]  Chronic Care Management    Clinical Social Work Note  05/08/2021 Name: Jennifer [name] MRN: [id number] DOB: 1975-11-29  Jennifer [name] is a 45 y.o. year old female who is a primary care patient of [name], MD. The CCM team was consulted to assist the patient with chronic disease management and/or care coordination needs related to: [employer] , Mental Health Counseling and Resources, and Financial Difficulties related to unemployment .   Engaged with patient by telephone for initial visit in response  to provider referral for social work chronic care management and care coordination services.   Consent to Services:  The patient was given information about Chronic Care Management services, agreed to services, and gave verbal consent prior to initiation of services.  Please see initial visit note for detailed documentation.   Patient agreed to services and consent obtained.   Assessment:  Review of patient past medical history, allergies, medications, and health status, including review of relevant consultants reports was performed today as part of a comprehensive evaluation and provision of chronic care management and care coordination services.     SDOH (Social Determinants of Health) assessments and interventions performed:  SDOH Interventions    Flowsheet Row Most Recent Value  SDOH Interventions   Depression Interventions/Treatment  Medication, Counseling, Currently on Treatment        Advanced Directives Status: Not addressed in this encounter.  CCM Care Plan  No Known Allergies  Outpatient Encounter Medications as of 05/08/2021  Medication Sig   busPIRone (BUSPAR) 15 MG tablet Take 1 tablet (15 mg total) by mouth 3 (three) times daily.   venlafaxine XR (EFFEXOR-XR) 150 MG 24 hr capsule TAKE 1 CAPSULE BY MOUTH DAILY WITH BREAKFAST.   acetaminophen (TYLENOL) 500 MG tablet Take 1,000 mg by mouth every 6 (six) hours as needed for mild pain or moderate pain.   Cholecalciferol (D3-50) 1.25 MG (50000 UT) capsule TAKE 1 CAPSULE BY MOUTH ONE TIME PER WEEK   clotrimazole-betamethasone (LOTRISONE) cream APPLY TO AFFECTED AREA TWICE A DAY   dicyclomine (BENTYL) 10 MG capsule TAKE 1 CAPSULE (10 MG TOTAL) BY MOUTH 2 (TWO) TIMES DAILY AS NEEDED FOR SPASMS.   fluticasone (FLONASE) 50 MCG/ACT nasal spray Place 2 sprays into both nostrils daily. (Patient taking differently: Place 2 sprays into both nostrils daily as needed for allergies.)   hydrOXYzine (ATARAX/VISTARIL) 50 MG tablet TAKE ONE TABLET AT BEDTIME FOR SLEEP (Patient taking differently: Take 50 mg by mouth at bedtime as needed (sleep).)   meloxicam (MOBIC) 15 MG tablet Take 15 mg by mouth daily.   metFORMIN (GLUCOPHAGE) 500 MG tablet Take 1 tablet (500 mg total) by mouth daily with breakfast.   Meth-Hyo-M Bl-Na Phos-Ph Sal (URIBEL) 118 MG CAPS Take 1 capsule (118 mg total) by mouth 2 (two) times daily as needed.    norethindrone (INCASSIA) 0.35 MG tablet Take 1 tablet (0.35 mg total) by mouth daily.   triamterene-hydrochlorothiazide (MAXZIDE) 75-50 MG tablet Take 1 tablet by mouth daily.   No facility-administered encounter medications on file as of 05/08/2021.    Patient Active Problem List   Diagnosis Date Noted   Reduced vision 04/03/2021   Hematochezia 09/09/2020   Abdominal pain 08/31/2020   Right wrist pain 07/21/2020   Unsteady gait 07/14/2020   Poor urinary stream 05/27/2020   Depression, major, single episode, severe (HCC) 06/18/2019   Environmental allergies 06/18/2019   Candidiasis of skin 01/22/2019   Back pain 11/28/2017   Essential hypertension 03/22/2016   Diverticulosis 11/23/2015   Hemorrhoids 03/26/2015   Acute cystitis with hematuria 12/21/2014   Insomnia due to other mental disorder 07/10/2014   GAD (generalized anxiety disorder) 07/10/2014   FHx: cancer of digestive organ 02/16/2014   Piles (hemorrhoids) 02/14/2014   Abnormal finding on Pap smear, HPV DNA positive 11/29/2013   Allergic rhinitis 02/12/2013   Prediabetes 03/26/2012   Dyspepsia 03/22/2012   TUBERCULOSIS 11/02/2010   Alopecia (capitis) totalis 11/02/2010   Chronic fatigue 07/23/2009   Morbid obesity (HCC) 11/24/2007   Depression  with anxiety 11/24/2007    Conditions to be addressed/monitored: Anxiety, Depression, and chronic pain related to BMI, joint pain ; Financial constraints related to unemployment and Mental Health Concerns   Care Plan : LCSW Plan of Care  Updates made by Buck Mam, LCSW since 05/08/2021 12:00 AM     Problem: Financial strains due to pain and inabilty to work exacerbating depression and anxiety   Priority: High     Goal: Quality of Life Maintained   Note:   Evidence-based guidance:  Assess patient's thoughts about quality of life, goals and expectations, and dissatisfaction or desire to improve.  Identify issues of primary importance such as mental health, illness,  exercise tolerance, pain, sexual function and intimacy, cognitive change, social isolation, finances and relationships.  Assess and monitor for signs/symptoms of psychosocial concerns, especially depression or ideations regarding harm to others or self; provide or refer for mental health services as needed.  Identify sensory issues that impact quality of life such as hearing loss, vision deficit; strategize ways to maintain or improve hearing, vision.  Promote access to services in the community to support independence such as support groups, home visiting programs, financial assistance, handicapped parking tags, durable medical equipment and emergency responder.  Promote activities to decrease social isolation such as group support or social, leisure and recreational activities, employment, use of social media; consider safety concerns about being out of home for activities.  Provide patient an opportunity to share by storytelling or a "life review" to give positive meaning to life and to assist with coping and negative experiences.  Encourage patient to tap into hope to improve sense of self.  Counsel based on prognosis and as early as possible about end-of-life and palliative care; consider referral to palliative care provider.  Advocate for the development of palliative care plan that may include avoidance of unnecessary testing and intervention, symptom control, discontinuation of medications, hospice and organ donation.  Counsel as early as possible those with life-limiting chronic disease about palliative care; consider referral to palliative care provider.  Advocate for the development of palliative care plan.   Notes:       Follow Up Plan: [facility] Guide will reach out to patient for assistance with financial resources. and Appointment scheduled for SW follow up with client by phone on: 05/22/2021      Reece Levy MSW, LCSW Licensed Clinical Social Systems analyst  Primary Care [phone]

## 2021-05-08 NOTE — Telephone Encounter (Signed)
Telephone encounter was:  Successful.  05/08/2021 Name: Jennifer [name] MRN: [id number] DOB: 1976/02/09  Jennifer [name] is a 45 y.o. year old female who is a primary care patient of Lodema Hong Milus Mallick, MD . The community resource team was consulted for assistance with  financial resources.  Care guide performed the following interventions: Spoke with patient confirmed email address [email or web address] sent resources for utility, rent, employment and food assistance.  Follow Up Plan:  Care guide will follow up with patient by phone over the next 7 days.  Lawren Sexson, AAS Paralegal, [facility] Care Guide  Embedded Care Coordination [place]  Care Management  [address] [place], [place] [address] ??[email or web address]  ?? [phone]   [email or web address]

## 2021-05-08 NOTE — Patient Instructions (Signed)
Visit Information   PATIENT GOALS:   Goals Addressed               This Visit's Progress     Find Help in My Community (pt-stated)        Timeframe:  Short-Term Goal Priority:  High Start Date:    05/08/21                         Expected End Date:  06/05/2021          Follow Up Date 05/22/2021  -expect a call from our Care Guide to link you with community resources/support -follow up with current agency support for additional support (help with [facility]?) -follow up with your counselor to schedule next visit asap -continue taking meds as prescribed -call 911 for any medical or mental health emergencies - begin a notebook of services in my neighborhood or community - call 211 when I need some help - follow-up on any referrals for help I am given - think ahead to make sure my need does not become an emergency - make a note about what I need to have by the phone or take with me, like an identification card or social security number have a back-up plan - have a back-up plan - make a list of family or friends that I can call    Why is this important?   Knowing how and where to find help for yourself or family in your neighborhood and community is an important skill.  You will want to take some steps to learn how.    Notes:          Consent to CCM Services: Jennifer [name] was given information about Chronic Care Management services today including:  CCM service includes personalized support from designated clinical staff supervised by her physician, including individualized plan of care and coordination with other care providers 24/7 contact phone numbers for assistance for urgent and routine care needs. Service will only be billed when office clinical staff spend 20 minutes or more in a month to coordinate care. Only one practitioner may furnish and bill the service in a calendar month. The patient may stop CCM services at any time (effective at the end of the month) by phone call  to the office staff. The patient will be responsible for cost sharing (co-pay) of up to 20% of the service fee (after annual deductible is met).  Patient agreed to services and verbal consent obtained.   The patient verbalized understanding of instructions, educational materials, and care plan provided today and declined offer to receive copy of patient instructions, educational materials, and care plan.   Telephone follow up appointment with care management team member scheduled for:05/22/21  Jennifer [name] MSW, LCSW Licensed Clinical Social Worker [facility] [phone]   CLINICAL CARE PLAN: Patient Care Plan: LCSW Plan of Care     Problem Identified: Financial strains due to pain and inabilty to work exacerbating depression and anxiety   Priority: High     Goal: Quality of Life Maintained   Note:   Evidence-based guidance:  Assess patient's thoughts about quality of life, goals and expectations, and dissatisfaction or desire to improve.  Identify issues of primary importance such as mental health, illness, exercise tolerance, pain, sexual function and intimacy, cognitive change, social isolation, finances and relationships.  Assess and monitor for signs/symptoms of psychosocial concerns, especially depression or ideations regarding harm to others or self; provide or  refer for mental health services as needed.  Identify sensory issues that impact quality of life such as hearing loss, vision deficit; strategize ways to maintain or improve hearing, vision.  Promote access to services in the community to support independence such as support groups, home visiting programs, financial assistance, handicapped parking tags, durable medical equipment and emergency responder.  Promote activities to decrease social isolation such as group support or social, leisure and recreational activities, employment, use of social media; consider safety concerns about being out of home for  activities.  Provide patient an opportunity to share by storytelling or a "life review" to give positive meaning to life and to assist with coping and negative experiences.  Encourage patient to tap into hope to improve sense of self.  Counsel based on prognosis and as early as possible about end-of-life and palliative care; consider referral to palliative care provider.  Advocate for the development of palliative care plan that may include avoidance of unnecessary testing and intervention, symptom control, discontinuation of medications, hospice and organ donation.  Counsel as early as possible those with life-limiting chronic disease about palliative care; consider referral to palliative care provider.  Advocate for the development of palliative care plan.   Notes:

## 2021-05-09 ENCOUNTER — Encounter: Payer: Self-pay | Admitting: Family Medicine

## 2021-05-09 DIAGNOSIS — Z599 Problem related to housing and economic circumstances, unspecified: Secondary | ICD-10-CM | POA: Insufficient documentation

## 2021-05-09 NOTE — Assessment & Plan Note (Signed)
Not at goal, still uncontrolled, increase buspar to 15 mg tid

## 2021-05-09 NOTE — Assessment & Plan Note (Signed)
Slightly improved, no change in medication, continue therapy

## 2021-05-09 NOTE — Assessment & Plan Note (Signed)
Interested in surgery, completing survey, I recommended bypass over sleeve for her  Patient re-educated about  the importance of commitment to a  minimum of 150 minutes of exercise per week as able.  The importance of healthy food choices with portion control discussed, as well as eating regularly and within a 12 hour window most days. The need to choose "clean , green" food 50 to 75% of the time is discussed, as well as to make water the primary drink and set a goal of 64 ounces water daily.    Weight /BMI 05/06/2021 05/04/2021 04/02/2021  WEIGHT 337 lb 325 lb 338 lb  HEIGHT 5\' 7"  5\' 7"  5\' 7"   BMI 52.78 kg/m2 50.9 kg/m2 52.94 kg/m2

## 2021-05-09 NOTE — Assessment & Plan Note (Signed)
Controlled, no change in medication DASH diet and commitment to daily physical activity for a minimum of 30 minutes discussed and encouraged, as a part of hypertension management. The importance of attaining a healthy weight is also discussed.  BP/Weight 05/06/2021 05/04/2021 04/28/2021 04/02/2021 01/26/2021 4/32/7614 7/0/9295  Systolic BP 747 340 370 964 98 383 818  Diastolic BP 86 92 69 98 70 95 91  Wt. (Lbs) 337 325 - 338 - 322 342  BMI 52.78 50.9 - 52.94 - 50.43 53.56

## 2021-05-09 NOTE — Assessment & Plan Note (Signed)
Patient educated about the importance of limiting  Carbohydrate intake , the need to commit to daily physical activity for a minimum of 30 minutes , and to commit weight loss. The fact that changes in all these areas will reduce or eliminate all together the development of diabetes is stressed.  Deteiorated  Diabetic Labs Latest Ref Rng & Units 05/06/2021 11/25/2020 09/04/2020 09/02/2020 05/29/2020  HbA1c 4.8 - 5.6 % 6.5(H) - 6.4(H) - -  Chol 100 - 199 mg/dL 152 - - - 136  HDL >39 mg/dL 49 - - - 58  Calc LDL 0 - 99 mg/dL 88 - - - 62  Triglycerides 0 - 149 mg/dL 79 - - - 76  Creatinine 0.57 - 1.00 mg/dL 0.85 0.88 - 0.88 0.79   BP/Weight 05/06/2021 05/04/2021 04/28/2021 04/02/2021 01/26/2021 2/70/6237 04/16/8314  Systolic BP 176 160 737 106 98 269 485  Diastolic BP 86 92 69 98 70 95 91  Wt. (Lbs) 337 325 - 338 - 322 342  BMI 52.78 50.9 - 52.94 - 50.43 53.56   No flowsheet data found.

## 2021-05-09 NOTE — Assessment & Plan Note (Signed)
Unemployed because of health and experiencing increased financial  challenges, requests help, referral entered

## 2021-05-12 ENCOUNTER — Telehealth: Payer: Self-pay

## 2021-05-12 NOTE — Telephone Encounter (Signed)
Telephone encounter was:  Successful.  05/12/2021 Name: NASYA KOSS MRN: [id number] DOB: 10-29-1976  Zipporah MARELIS CELIS is a 45 y.o. year old female who is a primary care patient of Lodema Hong Milus Mallick, MD . The community resource team was consulted for assistance with  housing, food and employment.  Care guide performed the following interventions: Spoke with patient she has received emailed resources for housing, food and employment.  Patient plans on contacting resources this week. .  Follow Up Plan:  No further follow up planned at this time. The patient has been provided with needed resources.  Gerlene Glassburn, AAS Paralegal, [facility] Care Guide  Embedded Care Coordination [place]  Care Management  [address] [place], [place] [address] ??[email or web address]  ?? [phone]   [email or web address]

## 2021-05-22 ENCOUNTER — Telehealth: Payer: Self-pay | Admitting: *Deleted

## 2021-05-22 ENCOUNTER — Telehealth: Payer: 59

## 2021-05-22 NOTE — Telephone Encounter (Signed)
Care Management   Follow Up Note   05/22/2021 Name: Jennifer [name] MRN: [id number] DOB: [birth year]   Referred by: [name], MD Reason for referral : No chief complaint on file.   An unsuccessful telephone outreach was attempted today. The patient was referred to the case management team for assistance with care management and care coordination.   Follow Up Plan: The care management team will reach out to the patient again over the next 10 days.   Reece Levy MSW, LCSW Licensed Occupational psychologist Primary Care [phone]

## 2021-05-26 ENCOUNTER — Telehealth: Payer: 59

## 2021-05-29 ENCOUNTER — Ambulatory Visit: Payer: 59 | Admitting: Dietician

## 2021-05-29 ENCOUNTER — Other Ambulatory Visit: Payer: Self-pay | Admitting: Family Medicine

## 2021-05-31 ENCOUNTER — Other Ambulatory Visit: Payer: Self-pay | Admitting: Family Medicine

## 2021-05-31 ENCOUNTER — Encounter: Payer: Self-pay | Admitting: Family Medicine

## 2021-06-01 ENCOUNTER — Telehealth: Payer: Self-pay

## 2021-06-01 NOTE — Telephone Encounter (Signed)
Message sent to MD

## 2021-06-02 ENCOUNTER — Ambulatory Visit: Payer: 59

## 2021-06-02 ENCOUNTER — Other Ambulatory Visit: Payer: Self-pay

## 2021-06-02 DIAGNOSIS — R3915 Urgency of urination: Secondary | ICD-10-CM

## 2021-06-02 MED ORDER — SOLIFENACIN SUCCINATE 5 MG PO TABS: 5.0000 mg | ORAL_TABLET | Freq: Every day | ORAL | 11 refills | Status: DC

## 2021-06-02 NOTE — Telephone Encounter (Signed)
Rx sent in. My chart message sent to patient.

## 2021-06-08 ENCOUNTER — Encounter: Payer: Self-pay | Admitting: Advanced Practice Midwife

## 2021-06-08 ENCOUNTER — Ambulatory Visit (INDEPENDENT_AMBULATORY_CARE_PROVIDER_SITE_OTHER): Payer: 59 | Admitting: Advanced Practice Midwife

## 2021-06-08 ENCOUNTER — Other Ambulatory Visit (HOSPITAL_COMMUNITY)
Admission: RE | Admit: 2021-06-08 | Discharge: 2021-06-08 | Disposition: A | Discharge status: Home / Self Care | Payer: 59 | Source: Ambulatory Visit | Attending: Advanced Practice Midwife | Admitting: Advanced Practice Midwife

## 2021-06-08 ENCOUNTER — Other Ambulatory Visit: Payer: Self-pay

## 2021-06-08 VITALS — BP 114/77 | HR 92 | Ht 67.0 in | Wt 337.0 lb

## 2021-06-08 DIAGNOSIS — N939 Abnormal uterine and vaginal bleeding, unspecified: Secondary | ICD-10-CM

## 2021-06-08 DIAGNOSIS — Z113 Encounter for screening for infections with a predominantly sexual mode of transmission: Secondary | ICD-10-CM | POA: Insufficient documentation

## 2021-06-08 NOTE — Addendum Note (Signed)
Addended by: Octaviano Glow on: 06/08/2021 04:50 PM   Modules accepted: Orders

## 2021-06-08 NOTE — Progress Notes (Signed)
Subjective:     Patient ID: Jennifer [name], female   DOB: 1976/07/25, 45 y.o.   MRN: [id number]  Jennifer [name] is a 45 y.o. G1P1001 who is here today with abnormal uterine bleeding. She states that she had bleeding beginning of June for about 1.5-2 weeks. Then around June 24 she had another "period" that went into July. She is unsure of the exact day it ended. Now she is having more spotting that started around 7/18. She reports that right now it is mostly spotting. She reports that it starts very light, and then gets heavy with clots. Patient is on a POP, and reports that she has been on this birth control for "years", and that she does not miss any pills.   Vaginal Bleeding The patient's primary symptoms include pelvic pain and vaginal bleeding. This is a new problem. Episode onset: around 7/18 or 7/19. The problem affects both sides. The vaginal discharge was bloody. Nothing aggravates the symptoms. She has tried nothing for the symptoms. She is sexually active (with a female partner).    Review of Systems  Genitourinary:  Positive for pelvic pain and vaginal bleeding.  All other systems reviewed and are negative.     Objective:   Physical Exam Vitals and nursing note reviewed. Exam conducted with a chaperone present.  Constitutional:      General: She is not in acute distress. HENT:     Head: Normocephalic.  Eyes:     Pupils: Pupils are equal, round, and reactive to light.  Cardiovascular:     Rate and Rhythm: Normal rate.  Pulmonary:     Effort: Pulmonary effort is normal.  Abdominal:     Palpations: Abdomen is soft.  Genitourinary:    Comments: External: ? Wart or HPV lesion on the left thigh/gluteal  Vagina: small amount of bright red blood seen  Cervix: pink, smooth, no CMT   Skin:    General: Skin is warm and dry.  Neurological:     Mental Status: She is alert and oriented to person, place, and time.  Psychiatric:        Mood and Affect: Mood normal.        Assessment:     1. Abnormal uterine bleeding        Plan:     - GC/CT testing today - Pap normal 08/2020 - Will get pelvic US - FU with MD provider for [place] results and plan DW patient that this could be normal perimenopausal bleeding, the POPs could be contributing. Will continue POPs with no change for now and evaluate after [place].   Thressa Sheller DNP, CNM  06/08/21  3:38 PM

## 2021-06-10 LAB — CERVICOVAGINAL ANCILLARY ONLY
Chlamydia: NEGATIVE
Comment: NEGATIVE
Comment: NEGATIVE
Comment: NORMAL
Neisseria Gonorrhea: NEGATIVE
Trichomonas: POSITIVE — AB

## 2021-06-10 MED ORDER — METRONIDAZOLE 500 MG PO TABS: 2000.0000 mg | ORAL_TABLET | Freq: Once | ORAL | 0 refills | Status: AC

## 2021-06-10 NOTE — Addendum Note (Signed)
Addended by: Marcille Buffy D on: 06/10/2021 01:45 PM   Modules accepted: Orders

## 2021-06-17 ENCOUNTER — Ambulatory Visit: Payer: 59 | Admitting: Gastroenterology

## 2021-06-22 ENCOUNTER — Other Ambulatory Visit: Payer: Self-pay

## 2021-06-22 ENCOUNTER — Ambulatory Visit (HOSPITAL_COMMUNITY)
Admission: RE | Admit: 2021-06-22 | Discharge: 2021-06-22 | Disposition: A | Discharge status: Home / Self Care | Payer: 59 | Source: Ambulatory Visit | Attending: Urology | Admitting: Urology

## 2021-06-22 DIAGNOSIS — R3129 Other microscopic hematuria: Secondary | ICD-10-CM | POA: Insufficient documentation

## 2021-06-22 IMAGING — CT CT ABD-PELV W/O CM
2 of 4 series · 15 of 46 positions shown, 17 images · non-contrast
Comparison: CT [DATE] and MRI lumbar spine [DATE]

CLINICAL DATA: Hematuria times 2-3 months.

EXAM:
CT ABDOMEN AND PELVIS WITHOUT CONTRAST
TECHNIQUE: Multidetector CT imaging of the abdomen and pelvis was performed
following the standard protocol without IV contrast.

[Series 2: axial st · axial · 0.98mm/px · z∈[+1077,+1537]mm · 12 of 110 slices shown, 14 images]
[im 9/110  soft-tissue]
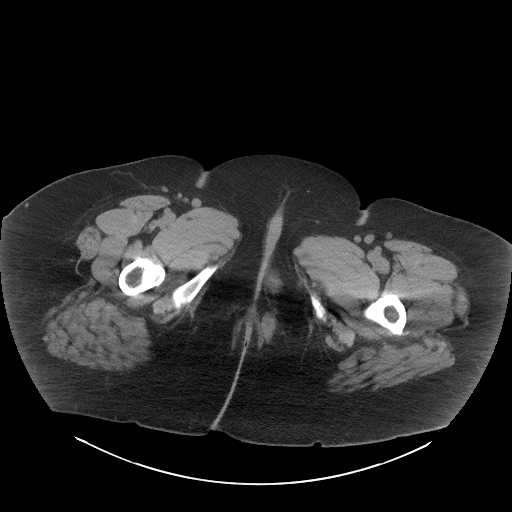
[im 9/110  bone]
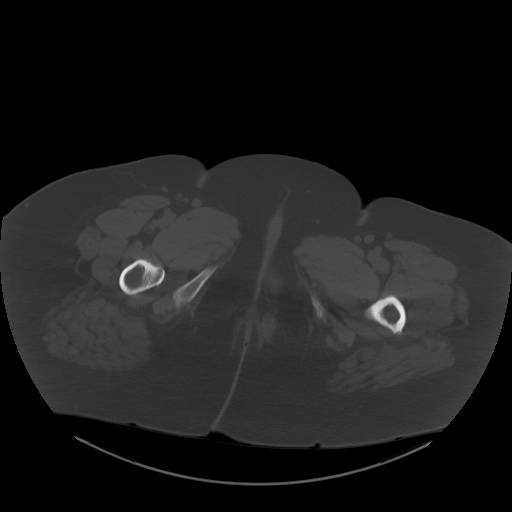
[im 17/110  soft-tissue]
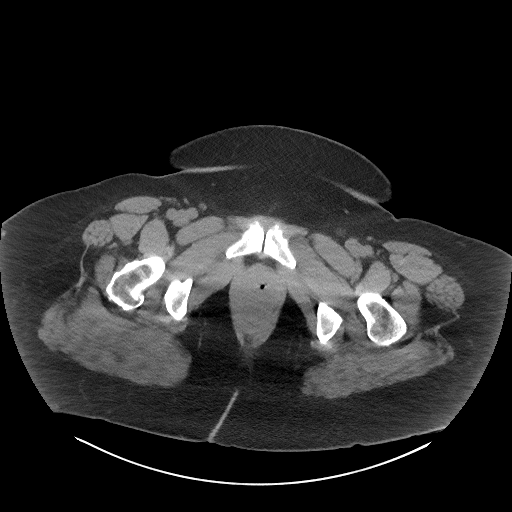
[im 26/110  soft-tissue]
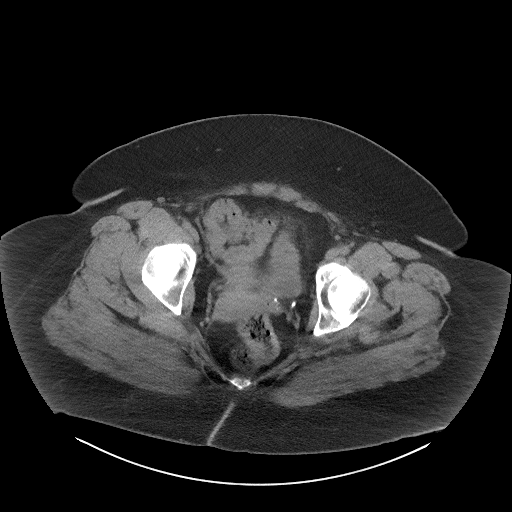
[im 34/110  soft-tissue]
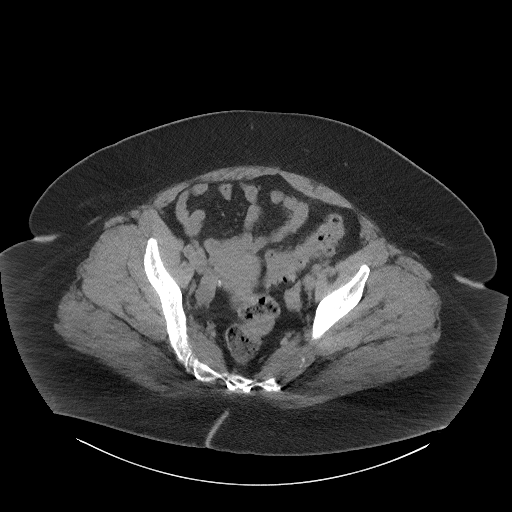
[im 42/110  soft-tissue]
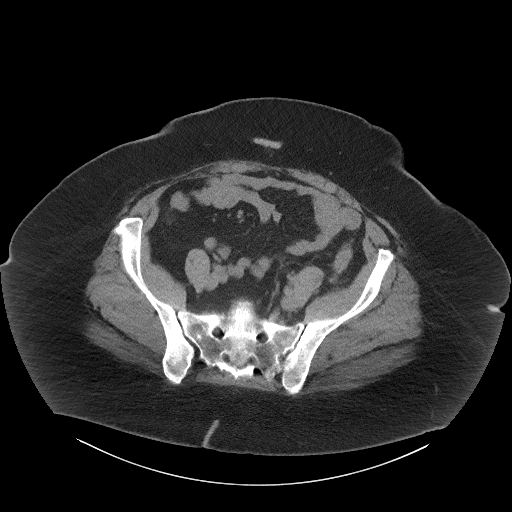
[im 51/110  soft-tissue]
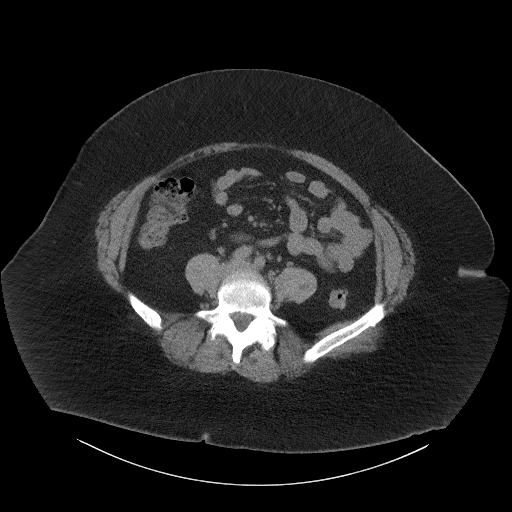
[im 59/110  soft-tissue]
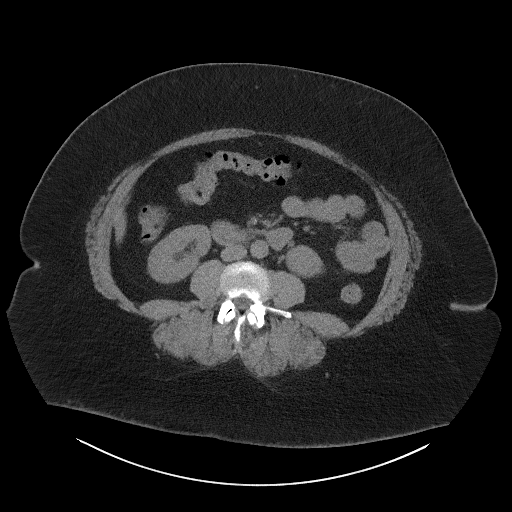
[im 68/110  soft-tissue]
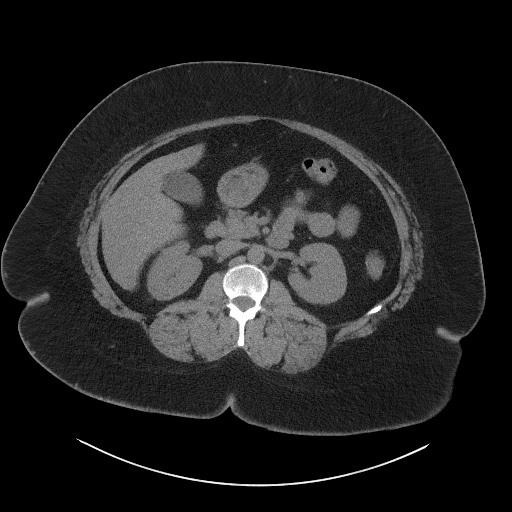
[im 76/110  soft-tissue]
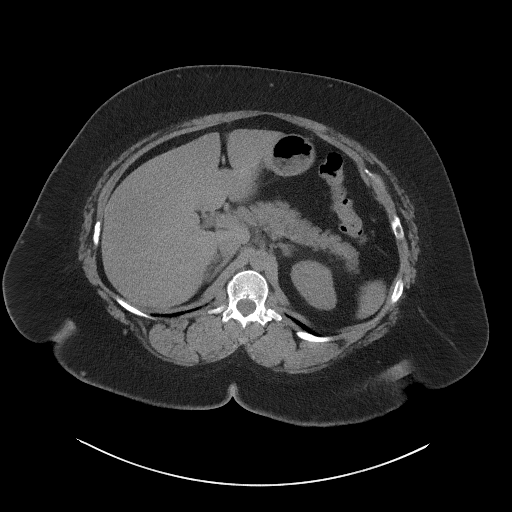
[im 76/110  bone]
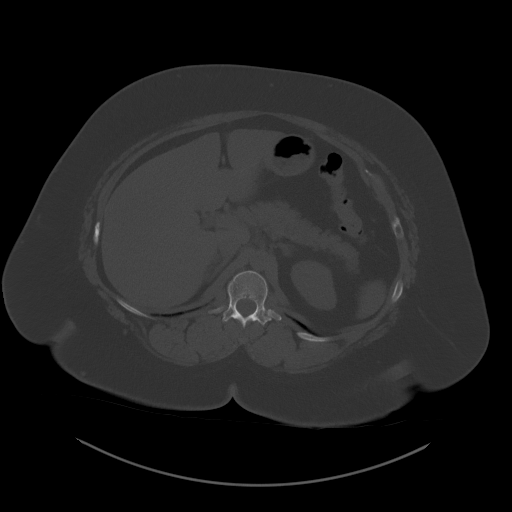
[im 84/110  soft-tissue]
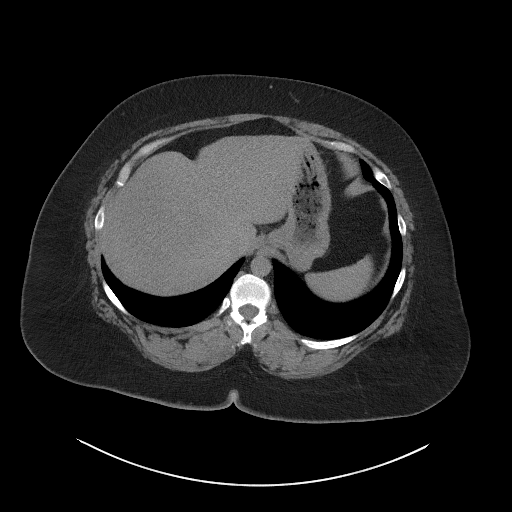
[im 93/110  soft-tissue]
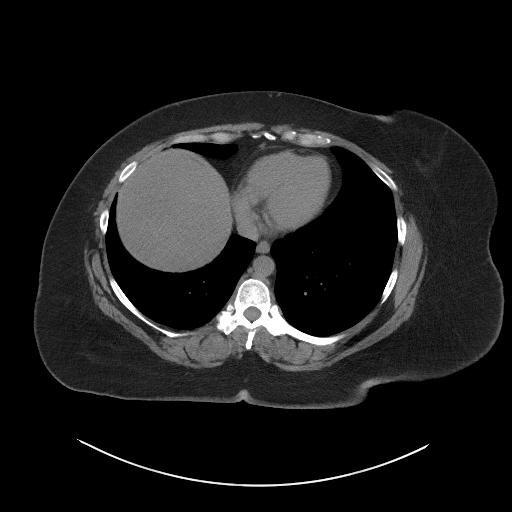
[im 101/110  soft-tissue]
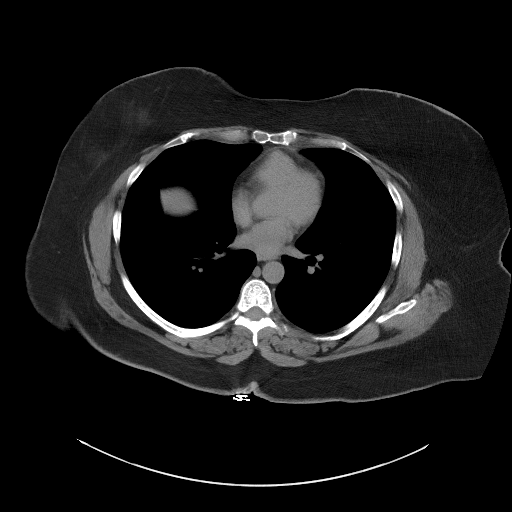

[Series 5: coronal st · coronal · 0.85mm/px · 3 of 134 slices shown]
[im 45/134  soft-tissue]
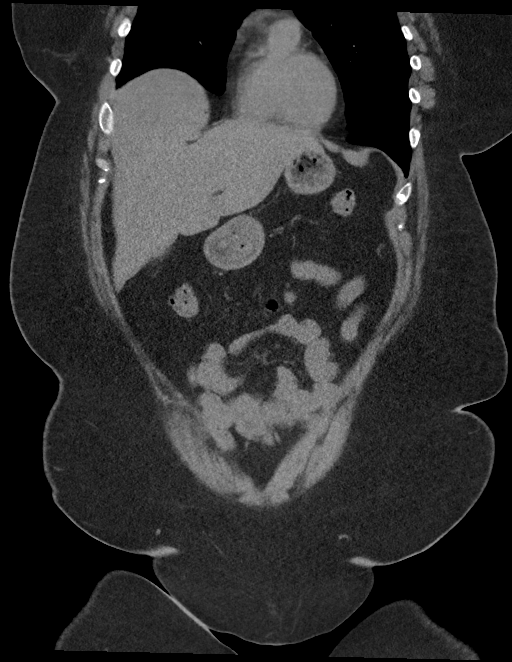
[im 60/134  soft-tissue]
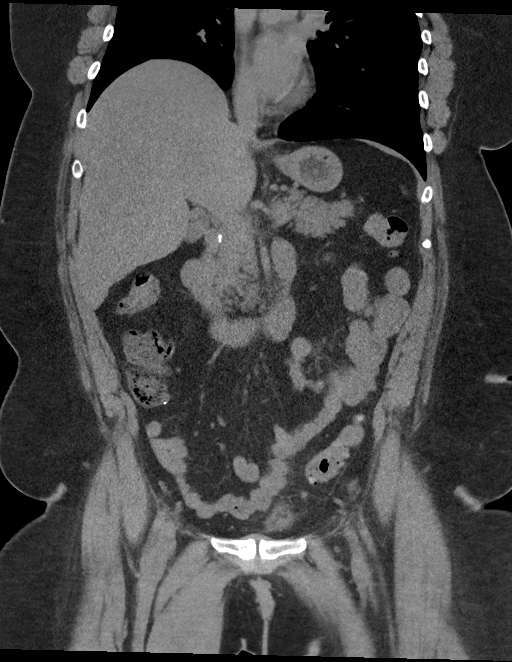
[im 74/134  soft-tissue]
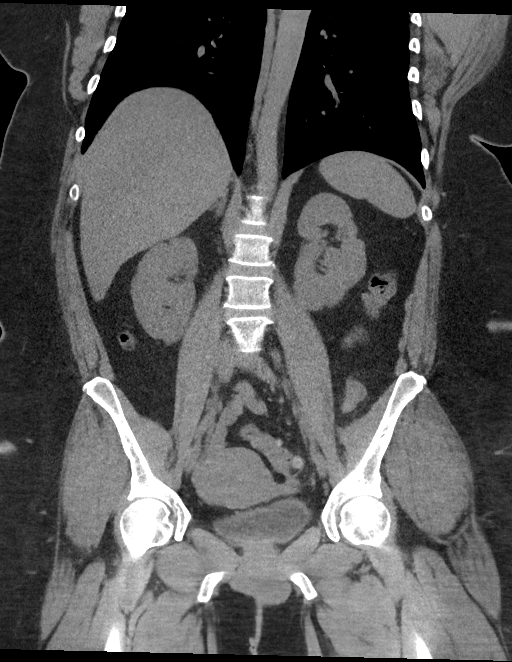

[15 of 46 positions shown; findings below may reference images not displayed]

FINDINGS: Lower chest: No acute abnormality.

Hepatobiliary: Unremarkable noncontrast appearance of the hepatic
parenchyma gallbladder is unremarkable. No biliary ductal dilation.

Pancreas: Unremarkable noncontrast appearance the pancreatic
parenchyma. No pancreatic ductal dilation. Calcification at the
pancreaticoduodenal groove which may represent sequela prior groove
pancreatitis.

Spleen: Within normal limits

Adrenals/Urinary Tract: Adrenal glands are unremarkable. Kidneys are
normal, without renal calculi, contour deforming lesion, or
hydronephrosis.

Gas in the urinary bladder with tenting of the left superior urinary
bladder with adjacent stellate soft tissue extending to the sigmoid
colon best seen on coronal image 67.

Stomach/Bowel: Stomach is unremarkable. No pathologic dilation of
small bowel. Appendix appears surgically absent. Terminal ileum
appears normal. Colonic diverticulosis with mild wall thickening and
adjacent stranding involving the sigmoid colon. Inflammation wall
thickening extending to a stellate areas soft tissue which extends
to the urinary bladder further described above.

Vascular/Lymphatic: No significant vascular findings are present. No
pathologically enlarged abdominal or pelvic lymph nodes.

Reproductive: Calcified uterine leiomyomas.

Other: No significant abdominopelvic ascites. No walled off fluid
collections. No pneumoperitoneum.

Musculoskeletal: Multilevel degenerative changes spine. No
aggressive lytic or blastic lesion of bone.
IMPRESSION: Colon diverticulosis with mild wall thickening and adjacent
inflammatory stranding involving the sigmoid, corresponding with the
previous area of sigmoid diverticulosis from [12]. There is now
stellate soft tissue extending from the sigmoid colon to the urinary
bladder with tenting of the urinary bladder and gas in the urinary
bladder. Findings which likely reflect a colovesical fistula as
sequela prior sigmoid diverticulitis, with a possible superimposed
acute component of mild sigmoid diverticulitis.

These results will be called to the ordering clinician or
representative by the Radiologist Assistant, and communication
documented in the PACS or [REDACTED].

## 2021-06-24 ENCOUNTER — Other Ambulatory Visit: Payer: Self-pay | Admitting: Adult Health

## 2021-06-24 MED ORDER — METRONIDAZOLE 500 MG PO TABS: 500.0000 mg | ORAL_TABLET | Freq: Two times a day (BID) | ORAL | 0 refills | Status: DC

## 2021-06-24 NOTE — Progress Notes (Signed)
Rx flagyl  

## 2021-06-29 ENCOUNTER — Ambulatory Visit (INDEPENDENT_AMBULATORY_CARE_PROVIDER_SITE_OTHER): Payer: 59 | Admitting: Urology

## 2021-06-29 ENCOUNTER — Telehealth: Payer: Self-pay | Admitting: Internal Medicine

## 2021-06-29 ENCOUNTER — Other Ambulatory Visit: Payer: Self-pay

## 2021-06-29 DIAGNOSIS — R3129 Other microscopic hematuria: Secondary | ICD-10-CM | POA: Diagnosis not present

## 2021-06-29 DIAGNOSIS — R3915 Urgency of urination: Secondary | ICD-10-CM | POA: Diagnosis not present

## 2021-06-29 DIAGNOSIS — K5792 Diverticulitis of intestine, part unspecified, without perforation or abscess without bleeding: Secondary | ICD-10-CM

## 2021-06-29 LAB — MICROSCOPIC EXAMINATION
Epithelial Cells (non renal): >10 /hpf — AB (ref 0–10)
Renal Epithel, UA: NONE SEEN /hpf

## 2021-06-29 LAB — URINALYSIS, ROUTINE W REFLEX MICROSCOPIC
Bilirubin, UA: NEGATIVE
Glucose, UA: NEGATIVE
Ketones, UA: NEGATIVE
Nitrite, UA: NEGATIVE
Protein,UA: NEGATIVE
Specific Gravity, UA: 1.015 (ref 1.005–1.030)
Urobilinogen, Ur: 0.2 mg/dL (ref 0.2–1.0)
pH, UA: 7 (ref 5.0–7.5)

## 2021-06-29 MED ORDER — CIPROFLOXACIN HCL 500 MG PO TABS
500.0000 mg | ORAL_TABLET | Freq: Once | ORAL | Status: AC
  Administered 2021-06-29: 500 mg via ORAL

## 2021-06-29 MED ORDER — TOLTERODINE TARTRATE ER 4 MG PO CP24: 4.0000 mg | ORAL_CAPSULE | Freq: Every day | ORAL | 11 refills | Status: DC

## 2021-06-29 NOTE — Telephone Encounter (Signed)
Pt called stating that she seen [facility] and they did a ct that showed changes in her diverticulosis. Pt states that they told her that she had gas in her urinary bladder. Pt has an appt with you in Oct and wants to know if there is anything that she should be worried about or need to do before her appointment.

## 2021-06-29 NOTE — Progress Notes (Signed)
   06/29/21  CC: No chief complaint on file.  F/u -  Here for cysto and management of OAB -   HPI:  1) frequency, urgency - she's had "crampy" bladder discomfort before and after she voids for a few weeks.Stream usually good but sometimes slow. No dysuria. She took abx but stopped early. No prior bladder meds. No bladder or pelvic surgery. She typically drinks clear soda like Sprite. Some constipation - diverticulosis. BM relieves bladder pressure. No NG risk. She has DM, but no meds. Urine cx was mixed growth.    She rx'd Vesicare 5 mg one po daily and Uribel prn but expensive and didn't start either.    2) hematuria - she noted pink urine on occasion. UA with mod bacteria, 11-30 rbcs. Non-smoker. No chemo, xrt or occupational exposure. Never had a kidney stones. She does not recall significant pneumaturia.   CT A/P  done 06/22/2021 with some tenting of the left superior bladder with adjacent soft tissue extending to the sigmoid colon and a small amount of air in the bladder c/w possible colovesical fistula. She also had colonic diverticulosis with mild wall thickening and adjacent stranding involving the sigmoid colon  - I reviewed all the images.   GI with Dr. Gala Romney.    There were no vitals taken for this visit. NED. A&Ox3.   No respiratory distress   Abd soft, NT, ND Normal external genitalia with patent urethral meatus  Chaperone for cysto and exam - Amanda   Cystoscopy Procedure Note  Patient identification was confirmed, informed consent was obtained, and patient was prepped using Betadine solution.  Lidocaine jelly was administered per urethral meatus.  She took a Cipro.   Procedure: - Flexible cystoscope introduced, without any difficulty.   - Thorough search of the bladder revealed:    normal urethral meatus    normal urothelium - in the left superior posterior bladder there is a subtle smooth "divot" that could be a healed fistula or a fistula trying to form - but there  was no obvious breach in the mucosa or erythema or edema     no stones    no ulcers     no tumors    no urethral polyps    no trabeculation  - Ureteral orifices were normal in position and appearance.  Post-Procedure: - Patient tolerated the procedure well  Assessment/ Plan:  Hematuria - benign GU eval. Will refer to Dr. Gala Romney to review CT and consider any further treatment or diagnosis.   Urgency - rx for tolterodine sent .  F/u 3 mo.      No follow-ups on file.  Festus Aloe, MD

## 2021-06-29 NOTE — Telephone Encounter (Signed)
PLEASE CALL PATIENT, SHE SAID HER PCP SAID SHE HAD DIVERTICULITIS AND SHE WOULD LIKE TO SPEAK TO A NURSE.

## 2021-06-30 MED ORDER — FLUCONAZOLE 150 MG PO TABS: ORAL_TABLET | ORAL | 0 refills | Status: DC

## 2021-06-30 MED ORDER — AMOXICILLIN-POT CLAVULANATE 875-125 MG PO TABS: 1.0000 | ORAL_TABLET | Freq: Two times a day (BID) | ORAL | 0 refills | Status: DC

## 2021-06-30 NOTE — Telephone Encounter (Signed)
Noted  

## 2021-06-30 NOTE — Telephone Encounter (Addendum)
Spoke to patient, she is urinating a lot. Has to urinate on the hour. Lots of nocturia. Feels like sometimes can't empty bladder. Passed gas during urination once. Some pain in the vagina area/pelvic area. Nausea. No vomiting. No fever. Holding urine a lot makes her feel sick. BM regular. No melena, brbpr.   Completing flagyl for trichomonas.   Discussed case with Dr. Gala Romney. There is concern for colovesical fistula and possible superimposed acute component of mild sigmoid diverticulitis.  She had cystoscopy yesterday with urology that showed subtle smooth "divot" that could be a healed fistula or a fistula trying to form but no obvious breach of the mucosa or erythema or edema.  GU evaluation felt to be benign patient encouraged to follow-up with [place] regarding CT findings.  prescription has been sent for urinary urgency but patient states she has not been able to afford it.    Dr. Gala Romney recommends patient to have antibiotics for diverticulitis, patient to call with progress report once antibiotics completed.  If ongoing symptoms/concern for fistula, she may need MR to further evaluate.  Keep current follow-up appointment for October for now but if she does not improve we to move that up.  Patient voiced understanding.  Augmentin twice daily for 10 days sent to her pharmacy.

## 2021-06-30 NOTE — Addendum Note (Signed)
Addended by: Mahala Menghini on: 06/30/2021 10:53 AM   Modules accepted: Orders

## 2021-07-01 ENCOUNTER — Other Ambulatory Visit: Payer: Self-pay

## 2021-07-01 ENCOUNTER — Ambulatory Visit (INDEPENDENT_AMBULATORY_CARE_PROVIDER_SITE_OTHER): Payer: 59 | Admitting: Family Medicine

## 2021-07-01 VITALS — BP 140/88 | HR 102 | Temp 99.0°F | Resp 18 | Ht 67.0 in | Wt 337.1 lb

## 2021-07-01 DIAGNOSIS — F322 Major depressive disorder, single episode, severe without psychotic features: Secondary | ICD-10-CM | POA: Diagnosis not present

## 2021-07-01 DIAGNOSIS — I10 Essential (primary) hypertension: Secondary | ICD-10-CM

## 2021-07-01 DIAGNOSIS — F411 Generalized anxiety disorder: Secondary | ICD-10-CM

## 2021-07-01 DIAGNOSIS — M5440 Lumbago with sciatica, unspecified side: Secondary | ICD-10-CM

## 2021-07-01 DIAGNOSIS — R0683 Snoring: Secondary | ICD-10-CM

## 2021-07-01 DIAGNOSIS — R7301 Impaired fasting glucose: Secondary | ICD-10-CM

## 2021-07-01 MED ORDER — VENLAFAXINE HCL ER 75 MG PO CP24: 75.0000 mg | ORAL_CAPSULE | Freq: Every day | ORAL | 3 refills | Status: DC

## 2021-07-01 NOTE — Patient Instructions (Addendum)
F/U in 10 weeks , re evaluate depression, weight and blood pressure, call if you need me sooner  Higher dose of effexor 225 mg daily, 150 mg tablet and 75 mg capsule take both together every day  HBA1C , chem 7 and eGFR 4 days before 10 week f/u visit  We will complete weight loss form and send it in ( nurse please assist as able, thanks!)  Anxiety improved stay on buspar 15 mg dose  Orthopedics or chiropractor to determine how long you are out on disability, we will release your med info on request to s/s  Thanks for choosing [facility], we consider it a privelige to serve you.  C/o depression and improvement in er 

## 2021-07-06 ENCOUNTER — Encounter: Payer: Self-pay | Admitting: Family Medicine

## 2021-07-06 DIAGNOSIS — R0683 Snoring: Secondary | ICD-10-CM | POA: Insufficient documentation

## 2021-07-06 NOTE — Assessment & Plan Note (Signed)
Improved , and controlled on current dose of buspar, continue same

## 2021-07-06 NOTE — Assessment & Plan Note (Signed)
Not at goal increase effexor dose and re eval

## 2021-07-06 NOTE — Assessment & Plan Note (Signed)
  Patient re-educated about  the importance of commitment to a  minimum of 150 minutes of exercise per week as able.  The importance of healthy food choices with portion control discussed, as well as eating regularly and within a 12 hour window most days. The need to choose "clean , green" food 50 to 75% of the time is discussed, as well as to make water the primary drink and set a goal of 64 ounces water daily.    Weight /BMI 07/01/2021 06/08/2021 05/06/2021  WEIGHT 337 lb 1.3 oz 337 lb 337 lb  HEIGHT '5\' 7"'$  '5\' 7"'$  '5\' 7"'$   BMI 52.79 kg/m2 52.78 kg/m2 52.78 kg/m2    Paperwork to be completed for bariatric surgery

## 2021-07-06 NOTE — Assessment & Plan Note (Signed)
C/o snoring, excess daytime sleepiness, and fatigue , refer to pulmonary

## 2021-07-06 NOTE — Assessment & Plan Note (Signed)
Chronic , disabling , limits mobility

## 2021-07-06 NOTE — Assessment & Plan Note (Signed)
DASH diet and commitment to daily physical activity for a minimum of 30 minutes discussed and encouraged, as a part of hypertension management. The importance of attaining a healthy weight is also discussed.  BP/Weight 07/01/2021 06/08/2021 05/06/2021 05/04/2021 04/28/2021 04/02/2021 [id number]  Systolic BP [id number] 98  Diastolic BP 88 77 86 92 69 98 70  Wt. (Lbs) 337.08 337 337 325 - 338 -  BMI 52.79 52.78 52.78 50.9 - 52.94 -   Elevated at visit, will re evaluate before med adjustment, anxious

## 2021-07-06 NOTE — Progress Notes (Signed)
Jennifer [name]     MRN: [id number]      DOB: [birth year]   HPI Jennifer [name] is here for follow up and re-evaluation of chronic medical conditions, medication management and review of any available recent lab and radiology data.  Preventive health is updated, specifically  Cancer screening and Immunization.   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  Now requesting that I maintain her out of work status and complete forms,  and I advise that she needs to continue with the specialist caring for her.  Of note she also sees orthopedics regarding the 2 problems.  She says financial concerns as a part of the problem.  She has already been [facility] for  financial assistance as needed.  Has paperwork trying to qualify her for bariatric surgery and I am happy to complete the forms.  She has tried over the years with little success to lose weight and suffers from severe morbid obesity.  ROS Denies recent fever or chills. Denies sinus pressure, nasal congestion, ear pain or sore throat. Denies chest congestion, productive cough or wheezing. Denies chest pains, palpitations and leg swelling Denies abdominal pain, nausea, vomiting,diarrhea or constipation.   Denies dysuria, frequency, hesitancy or incontinence. . Denies headaches, seizures, numbness, or tingling. C/o  depression, improvement in her anxiety denies  insomnia. Denies skin break down or rash.   PE  BP 140/88   Pulse (!) 102   Temp 99 F (37.2 C)   Resp 18   Ht 5\' 7"  (1.702 m)   Wt (!) 337 lb 1.3 oz (152.9 kg)   SpO2 95%   BMI 52.79 kg/m   Patient alert and oriented and in no cardiopulmonary distress.  HEENT: No facial asymmetry, EOMI,     Neck supple .  Chest: Clear to auscultation bilaterally.  CVS: S1, S2 no murmurs, no S3.Regular rate.  ABD: Soft non tender.   Ext: No edema  MS: decreased  ROM spine,  and knees.  Skin: Intact, no ulcerations or rash noted.  Psych:  Good eye contact, normal affect. Memory intact not anxious mildly depressed appearing.  CNS: CN 2-12 intact, power,  normal throughout.no focal deficits noted.   Assessment & Plan  Depression, major, single episode, severe (HCC) Not at goal increase effexor dose and re eval  GAD (generalized anxiety disorder) Improved , and controlled on current dose of buspar, continue same  Essential hypertension DASH diet and commitment to daily physical activity for a minimum of 30 minutes discussed and encouraged, as a part of hypertension management. The importance of attaining a healthy weight is also discussed.  BP/Weight 07/01/2021 06/08/2021 05/06/2021 05/04/2021 04/28/2021 04/02/2021 01/26/2021  Systolic BP 140 114 130 137 138 140 98  Diastolic BP 88 77 86 92 69 98 70  Wt. (Lbs) 337.08 337 337 325 - 338 -  BMI 52.79 52.78 52.78 50.9 - 52.94 -   Elevated at visit, will re evaluate before med adjustment, anxious    Back pain Chronic , disabling , limits mobility  Morbid obesity (HCC)  Patient re-educated about  the importance of commitment to a  minimum of 150 minutes of exercise per week as able.  The importance of healthy food choices with portion control discussed, as well as eating regularly and within a 12 hour window most days. The need to choose "clean , green" food 50 to 75% of the time is discussed, as well as to make water  the primary drink and set a goal of 64 ounces water daily.    Weight /BMI 07/01/2021 06/08/2021 05/06/2021  WEIGHT 337 lb 1.3 oz 337 lb 337 lb  HEIGHT 5\' 7"  5\' 7"  5\' 7"   BMI 52.79 kg/m2 52.78 kg/m2 52.78 kg/m2    Paperwork to be completed for bariatric surgery  Snoring C/o snoring, excess daytime sleepiness, and fatigue , refer to pulmonary

## 2021-07-07 ENCOUNTER — Other Ambulatory Visit: Payer: Self-pay

## 2021-07-07 ENCOUNTER — Encounter: Payer: Self-pay | Admitting: Nutrition

## 2021-07-07 ENCOUNTER — Encounter: Payer: 59 | Attending: Family Medicine | Admitting: Nutrition

## 2021-07-07 VITALS — Ht 67.0 in | Wt 337.0 lb

## 2021-07-07 DIAGNOSIS — I10 Essential (primary) hypertension: Secondary | ICD-10-CM

## 2021-07-07 DIAGNOSIS — IMO0002 Reserved for concepts with insufficient information to code with codable children: Secondary | ICD-10-CM

## 2021-07-07 DIAGNOSIS — E1165 Type 2 diabetes mellitus with hyperglycemia: Secondary | ICD-10-CM | POA: Insufficient documentation

## 2021-07-07 DIAGNOSIS — E118 Type 2 diabetes mellitus with unspecified complications: Secondary | ICD-10-CM | POA: Diagnosis not present

## 2021-07-07 NOTE — Progress Notes (Signed)
Medical Nutrition Therapy  Appointment Start time:  1330  Appointment End time:  1430  Primary concerns today: Obesity, Type 2 Dm  Referral diagnosis: E11.8, E66.01. Preferred learning style: auditory Learning readiness: Ready   NUTRITION ASSESSMENT  Since seeing Dr. Moshe Cipro, she has been eating more fruit and vegetables.  Is seeing a chiropractor for knees and back. Dr.Lineberry in [place] and sees an ortho MD in [place].  Anthropometrics  Lab Results  Component Value Date   HGBA1C 6.5 (H) 05/06/2021      Clinical Medical Hx: HTN, Obesity  Medications: .Metformin 500 mg once a day Labs:  Lab Results  Component Value Date   HGBA1C 6.5 (H) 05/06/2021   CMP Latest Ref Rng & Units 05/06/2021 11/25/2020 09/02/2020  Glucose 65 - 99 mg/dL 88 100(H) 87  BUN 6 - 24 mg/dL '13 19 10  '$ Creatinine 0.57 - 1.00 mg/dL 0.85 0.88 0.88  Sodium 134 - 144 mmol/L 138 138 138  Potassium 3.5 - 5.2 mmol/L 4.7 3.7 4.5  Chloride 96 - 106 mmol/L 99 103 101  CO2 20 - 29 mmol/L '24 24 28  '$ Calcium 8.7 - 10.2 mg/dL 10.0 9.3 9.3  Total Protein 6.0 - 8.5 g/dL 7.2 - -  Total Bilirubin 0.0 - 1.2 mg/dL <0.2 - -  Alkaline Phos 44 - 121 IU/L 164(H) - -  AST 0 - 40 IU/L 17 - -  ALT 0 - 32 IU/L 21 - -   Lab Results  Component Value Date   HGBA1C 6.5 (H) 05/06/2021    Notable Signs/Symptoms: Tired, fatigued. Increased urination. UTI and yeast infections.  Lifestyle & Dietary Hx Lives by herself and has her daughter for 10 days. Had diverticulosis. Sees GI. Is on antibiotic now..  Estimated daily fluid intake: 40 oz Supplements:  Sleep: 6-8 Stress / self-care: none Current average weekly physical activity: ADL,   24-Hr Dietary Recall First Meal:  1-2 am: doritos  7 am nectarine, water V8 Juice, Snack:  Second Meal: Snack: 4  fried chicken tender, Kevita drink and water Third Meal: Salad spinach, cheese, [place], croutons, water Snack:  Beverages: Water, Nurse, children's   Estimated Energy Needs Calories:  1200 Carbohydrate: 135g Protein: 90g Fat: 33g   NUTRITION DIAGNOSIS  NB-1.1 Food and nutrition-related knowledge deficit As related to Type 2 DM and Obesity.  As evidenced by A1C 6.5% and BMI > 30.   NUTRITION INTERVENTION  Nutrition education (E-1) on the following topics:  Nutrition and Diabetes education provided on My Plate, CHO counting, meal planning, portion sizes, timing of meals, avoiding snacks between meals unless having a low blood sugar, target ranges for A1C and blood sugars, signs/symptoms and treatment of hyper/hypoglycemia, monitoring blood sugars, taking medications as prescribed, benefits of exercising 30 minutes per day and prevention of complications of DM.   Handouts Provided Include  My Plate  Meal Plan Card Plant based meal plan/lifestyle Diabetes instructions  Learning Style & Readiness for Change Teaching method utilized: Visual & Auditory  Demonstrated degree of understanding via: Teach Back  Barriers to learning/adherence to lifestyle change: none  Goals Established by Pt Goals  Cut out late night snacking Drink water -100 oz per day Drink Kavita instead of snacking in the middle of the night. Increase low carb vegetables. Schedule time to go to the gym with your friend. Keep food journal Lose 1-2 lbs per week   MONITORING & EVALUATION Dietary intake, weekly physical activity, and blood sugars in 1 month.  Next Steps  Patient is  to work on meal planning and better food choices.Marland Kitchen

## 2021-07-07 NOTE — Patient Instructions (Signed)
Goals  Cut out late night snacking Drink water -100 oz per day Drink Kavita instead of snacking in the middle of the night. Increase low carb vegetables. Schedule time to go to the gym with your friend. Keep food journal Lose 1-2 lbs per week

## 2021-07-08 ENCOUNTER — Telehealth: Payer: Self-pay

## 2021-07-08 DIAGNOSIS — R3915 Urgency of urination: Secondary | ICD-10-CM

## 2021-07-08 NOTE — Telephone Encounter (Signed)
Pharmacy states insurance will not approve tolteradine

## 2021-07-14 ENCOUNTER — Ambulatory Visit (INDEPENDENT_AMBULATORY_CARE_PROVIDER_SITE_OTHER): Payer: 59 | Admitting: Obstetrics & Gynecology

## 2021-07-14 ENCOUNTER — Other Ambulatory Visit: Payer: Self-pay

## 2021-07-14 ENCOUNTER — Encounter: Payer: Self-pay | Admitting: Obstetrics & Gynecology

## 2021-07-14 ENCOUNTER — Ambulatory Visit (INDEPENDENT_AMBULATORY_CARE_PROVIDER_SITE_OTHER): Payer: 59

## 2021-07-14 VITALS — BP 132/92 | HR 111 | Wt 337.0 lb

## 2021-07-14 DIAGNOSIS — N920 Excessive and frequent menstruation with regular cycle: Secondary | ICD-10-CM | POA: Diagnosis not present

## 2021-07-14 DIAGNOSIS — N946 Dysmenorrhea, unspecified: Secondary | ICD-10-CM | POA: Diagnosis not present

## 2021-07-14 DIAGNOSIS — D250 Submucous leiomyoma of uterus: Secondary | ICD-10-CM

## 2021-07-14 DIAGNOSIS — N939 Abnormal uterine and vaginal bleeding, unspecified: Secondary | ICD-10-CM | POA: Diagnosis not present

## 2021-07-14 MED ORDER — MEGESTROL ACETATE 40 MG PO TABS: ORAL_TABLET | ORAL | 4 refills | Status: DC

## 2021-07-14 NOTE — Progress Notes (Signed)
Follow up appointment for results  Chief Complaint  Patient presents with   U/S    Blood pressure (!) 132/92, pulse (!) 111, weight (!) 337 lb (152.9 kg), last menstrual period 07/06/2021.  US PELVIC COMPLETE WITH TRANSVAGINAL  Result Date: 07/14/2021 Images from the original result were not included.  [facility] @ [facility] [address] ,[place] [address]                                                                                                                        GYNECOLOGIC SONOGRAM Jennifer [name] is a 45 y.o. G1P1001 LMP 07/06/2021 She is here for a pelvic sonogram for abnormal uterine bleeding. Uterus                      7.1 x 5.7 x 6.3 cm, Total uterine volume 132 cc, heterogeneous anteverted uterus with mult fibroids,(#1) posterior right fundal submucosal fibroid 2.5 x 2.2 x 2.3 cm,(#2) posterior left submucosal fibroid 2.5 x 2.3 x 2.6 cm,(#3) posterior calcified intramural fibroid 13 x 9 x 8 mm Endometrium          6.2 mm, symmetrical, distorted by submucosal fibroids Right ovary             2.9 x 1.6 x 2.9 cm, wnl Left ovary                2.8 x 3.1 x 2.6 cm, homogeneous left ovarian mass with posterior shadowing (? Dermoid) 2.3 x 2.1 x 1.1 cm No free fluid Technician Comments: PELVIC US TA/TV: heterogeneous anteverted uterus with mult fibroids,(#1) posterior right fundal submucosal fibroid 2.5 x 2.2 x 2.3 cm,(#2) posterior left submucosal fibroid 2.5 x 2.3 x 2.6 cm,(#3) posterior calcified intramural fibroid 13 x 9 x 8 mm,EEC 6.2 mm,normal right ovary,homogeneous left ovarian mass with posterior shadowing (? Dermoid) 2.3 x 2.1 x 1.1 cm, ovaries appear mobile,no free fluid,no pain during ultrasound Chaperone [address] Heide Guile 07/14/2021 3:54 PM Clinical Impression and recommendations: I have reviewed the sonogram results above, combined with the patient's current clinical course, below are my impressions and any appropriate recommendations for management  based on the sonographic findings. Uterus is overall normal size but with several small fibroids 1 of which is submucosal Endometrium normal Ovaries are normal with a left benign cystic teratoma [name] 07/14/2021 4:08 PM     MEDS ordered this encounter: Meds ordered this encounter  Medications   megestrol (MEGACE) 40 MG tablet    Sig: 3 tablets a day for 5 days, 2 tablets a day for 5 days then 1 tablet daily    Dispense:  45 tablet    Refill:  4    Orders for this encounter: No orders of the defined types were placed in this encounter.   Impression:   ICD-10-CM   1. Menorrhagia with regular cycle  N92.0     2. Dysmenorrhea  N94.6  3. Fibroids, submucosal  D25.0        Plan: Sonogram reviewed Megestrol trial Follow up 3 months  Follow Up: Return in about 3 months (around 10/14/2021) for Follow up, with Dr Elonda Husky.     All questions were answered.  Past Medical History:  Diagnosis Date   Abnormal vaginal Pap smear    Acid reflux    Alopecia    Anxiety    Arthritis    Depression    Diabetes mellitus without complication (HCC)    prediabetic, takes no meds   Diverticulosis    Family history of diabetes mellitus    GERD (gastroesophageal reflux disease)    Hemorrhoids    History of recurrent UTIs    Hypertension    Obesity     Past Surgical History:  Procedure Laterality Date   APPENDECTOMY     COLONOSCOPY N/A 03/13/2014   Dr. Gala Romney- grade 3 hemorrhoids, colonic diverticulosis bx= benign lymphoid polyp   COLONOSCOPY WITH PROPOFOL N/A 01/26/2021   Procedure: COLONOSCOPY WITH PROPOFOL;  Surgeon: Daneil Dolin, MD;  [facility];  Service: Endoscopy;  Laterality: N/A;  AM   KNEE ARTHROSCOPY     right   POLYPECTOMY  01/26/2021   Procedure: POLYPECTOMY;  Surgeon: Daneil Dolin, MD;  [facility];  Service: Endoscopy;;    OB History     Gravida  1   Para  1   Term  1   Preterm      AB      Living  1      SAB       IAB      Ectopic      Multiple      Live Births  1           No Known Allergies  Social History   Socioeconomic History   Marital status: Single    Spouse name: Not on file   Number of children: 4   Years of education: Not on file   Highest education level: Some college, no degree  Occupational History   Occupation: UNEMPLOYWED SINCE 10/2010  Tobacco Use   Smoking status: Never   Smokeless tobacco: Never  Vaping Use   Vaping Use: Never used  Substance and Sexual Activity   Alcohol use: No   Drug use: No   Sexual activity: Yes    Birth control/protection: Pill  Other Topics Concern   Not on file  Social History Narrative   Not on file   Social Determinants of Health   Financial Resource Strain: High Risk   Difficulty of Paying Living Expenses: Hard  Food Insecurity: No Food Insecurity   Worried About Running Out of Food in the Last Year: Never true   Ran Out of Food in the Last Year: Never true  Transportation Needs: No Transportation Needs   Lack of Transportation (Medical): No   Lack of Transportation (Non-Medical): No  Physical Activity: Inactive   Days of Exercise per Week: 0 days   Minutes of Exercise per Session: 0 min  Stress: No Stress Concern Present   Feeling of Stress : Only a little  Social Connections: Moderately Isolated   Frequency of Communication with Friends and Family: More than three times a week   Frequency of Social Gatherings with Friends and Family: Three times a week   Attends Religious Services: 1 to 4 times per year   Active Member of Clubs or Organizations: No   Attends  Club or Organization Meetings: Never   Marital Status: Never married    Family History  Adopted: Yes  Problem Relation Age of Onset   Hypertension Mother    Diabetes Mother    Other Mother        cholangiocarcinoma, age 89, deceased   Colon cancer Neg Hx

## 2021-07-14 NOTE — Progress Notes (Signed)
PELVIC US TA/TV: heterogeneous anteverted uterus with mult fibroids,(#1) posterior right fundal submucosal fibroid 2.5 x 2.2 x 2.3 cm,(#2) posterior left submucosal fibroid 2.5 x 2.3 x 2.6 cm,(#3) posterior calcified intramural fibroid 13 x 9 x 8 mm,EEC 6.2 mm,normal right ovary,homogeneous left ovarian mass with posterior shadowing (? Dermoid) 2.3 x 2.1 x 1.1 cm, ovaries appear mobile,no free fluid,no pain during ultrasound  Chaperone Peggy

## 2021-07-16 ENCOUNTER — Encounter: Payer: Self-pay | Admitting: Internal Medicine

## 2021-07-26 ENCOUNTER — Other Ambulatory Visit: Payer: Self-pay | Admitting: Family Medicine

## 2021-07-26 DIAGNOSIS — E559 Vitamin D deficiency, unspecified: Secondary | ICD-10-CM

## 2021-07-28 ENCOUNTER — Encounter: Payer: Self-pay | Admitting: Nutrition

## 2021-08-04 ENCOUNTER — Telehealth: Payer: Self-pay | Admitting: *Deleted

## 2021-08-04 ENCOUNTER — Other Ambulatory Visit: Payer: Self-pay | Admitting: *Deleted

## 2021-08-05 ENCOUNTER — Institutional Professional Consult (permissible substitution): Payer: 59 | Admitting: Pulmonary Disease

## 2021-08-07 NOTE — Telephone Encounter (Signed)
Oxyburynin or Oxybutynin ER

## 2021-08-11 MED ORDER — OXYBUTYNIN CHLORIDE ER 10 MG PO TB24: 10.0000 mg | ORAL_TABLET | Freq: Every day | ORAL | 11 refills | Status: DC

## 2021-08-11 NOTE — Telephone Encounter (Signed)
Medication sent to pharmacy  

## 2021-08-12 ENCOUNTER — Ambulatory Visit: Payer: 59 | Admitting: Nutrition

## 2021-08-16 ENCOUNTER — Other Ambulatory Visit: Payer: Self-pay | Admitting: Family Medicine

## 2021-08-17 ENCOUNTER — Encounter: Payer: Self-pay | Admitting: Obstetrics & Gynecology

## 2021-08-20 ENCOUNTER — Other Ambulatory Visit: Payer: Self-pay | Admitting: Family Medicine

## 2021-08-21 ENCOUNTER — Ambulatory Visit: Payer: Self-pay | Admitting: *Deleted

## 2021-08-21 DIAGNOSIS — R1030 Lower abdominal pain, unspecified: Secondary | ICD-10-CM

## 2021-08-21 DIAGNOSIS — M25562 Pain in left knee: Secondary | ICD-10-CM

## 2021-08-21 DIAGNOSIS — F322 Major depressive disorder, single episode, severe without psychotic features: Secondary | ICD-10-CM

## 2021-08-21 DIAGNOSIS — F411 Generalized anxiety disorder: Secondary | ICD-10-CM

## 2021-08-21 NOTE — Chronic Care Management (AMB) (Signed)
Chronic Care Management    Clinical Social Work Note  08/21/2021 Name: Jennifer [name] MRN: [id number] DOB: January 08, 1976  Jennifer [name] is a 45 y.o. year old female who is a primary care patient of Lodema Hong, Milus Mallick, MD. The CCM team was consulted to assist the patient with chronic disease management and/or care coordination needs related to: [employer] , Mental Health Counseling and Resources, Financial Difficulties related to being out of work, and chronic pain/obesity .   Engaged with patient by telephone for follow up visit in response to provider referral for social work chronic care management and care coordination services.   Consent to Services:  The patient was given information about Chronic Care Management services, agreed to services, and gave verbal consent prior to initiation of services.  Please see initial visit note for detailed documentation.   Patient agreed to services and consent obtained.   Assessment: Review of patient past medical history, allergies, medications, and health status, including review of relevant consultants reports was performed today as part of a comprehensive evaluation and provision of chronic care management and care coordination services.     SDOH (Social Determinants of Health) assessments and interventions performed:    Advanced Directives Status: Not addressed in this encounter.  CCM Care Plan  No Known Allergies  Outpatient Encounter Medications as of 08/21/2021  Medication Sig Note   acetaminophen (TYLENOL) 500 MG tablet Take 1,000 mg by mouth every 6 (six) hours as needed for mild pain or moderate pain.    amoxicillin-clavulanate (AUGMENTIN) 875-125 MG tablet Take 1 tablet by mouth 2 (two) times daily. 07/01/2021: Has not picked up yet   busPIRone (BUSPAR) 15 MG tablet TAKE 1 TABLET BY MOUTH 3 TIMES DAILY.    Cholecalciferol (VITAMIN D3) 1.25 MG (50000 UT) CAPS TAKE 1 CAPSULE BY MOUTH ONE TIME PER WEEK     clotrimazole-betamethasone (LOTRISONE) cream APPLY TO AFFECTED AREA TWICE A DAY    dicyclomine (BENTYL) 10 MG capsule TAKE 1 CAPSULE (10 MG TOTAL) BY MOUTH 2 (TWO) TIMES DAILY AS NEEDED FOR SPASMS.    fluconazole (DIFLUCAN) 150 MG tablet Take one dose every 72 hours X 3.    fluticasone (FLONASE) 50 MCG/ACT nasal spray Place 2 sprays into both nostrils daily. (Patient taking differently: Place 2 sprays into both nostrils daily as needed for allergies.)    hydrochlorothiazide (HYDRODIURIL) 25 MG tablet TAKE 1 TABLET BY MOUTH EVERY DAY    hydrOXYzine (ATARAX/VISTARIL) 50 MG tablet TAKE ONE TABLET AT BEDTIME FOR SLEEP (Patient taking differently: Take 50 mg by mouth at bedtime as needed (sleep).)    INCASSIA 0.35 MG tablet TAKE 1 TABLET BY MOUTH EVERY DAY    megestrol (MEGACE) 40 MG tablet 3 tablets a day for 5 days, 2 tablets a day for 5 days then 1 tablet daily    meloxicam (MOBIC) 15 MG tablet Take 15 mg by mouth daily.    metFORMIN (GLUCOPHAGE) 500 MG tablet TAKE 1 TABLET BY MOUTH EVERY DAY WITH BREAKFAST    oxybutynin (DITROPAN-XL) 10 MG 24 hr tablet Take 1 tablet (10 mg total) by mouth daily.    triamterene-hydrochlorothiazide (MAXZIDE) 75-50 MG tablet Take 1 tablet by mouth daily.    venlafaxine XR (EFFEXOR XR) 75 MG 24 hr capsule Take 1 capsule (75 mg total) by mouth daily with breakfast.    venlafaxine XR (EFFEXOR-XR) 150 MG 24 hr capsule TAKE 1 CAPSULE BY MOUTH DAILY WITH BREAKFAST.    No facility-administered encounter medications on file as  of 08/21/2021.    Patient Active Problem List   Diagnosis Date Noted   Snoring 07/06/2021   Financial difficulties 05/09/2021   Reduced vision 04/03/2021   Hematochezia 09/09/2020   Abdominal pain 08/31/2020   Right wrist pain 07/21/2020   Unsteady gait 07/14/2020   Poor urinary stream 05/27/2020   Depression, major, single episode, severe (HCC) 06/18/2019   Environmental allergies 06/18/2019   Candidiasis of skin 01/22/2019   Back pain  11/28/2017   Essential hypertension 03/22/2016   Diverticulosis 11/23/2015   Hemorrhoids 03/26/2015   Insomnia due to other mental disorder 07/10/2014   GAD (generalized anxiety disorder) 07/10/2014   FHx: cancer of digestive organ 02/16/2014   Piles (hemorrhoids) 02/14/2014   Abnormal finding on Pap smear, HPV DNA positive 11/29/2013   Allergic rhinitis 02/12/2013   Prediabetes 03/26/2012   Dyspepsia 03/22/2012   TUBERCULOSIS 11/02/2010   Alopecia (capitis) totalis 11/02/2010   Chronic fatigue 07/23/2009   Morbid obesity (HCC) 11/24/2007   Depression with anxiety 11/24/2007    Conditions to be addressed/monitored: Depression; Financial constraints related to out of work and Mental Health Concerns   Care Plan : LCSW Plan of Care  Updates made by Buck Mam, LCSW since 08/21/2021 12:00 AM     Problem: Financial strains due to pain and inabilty to work exacerbating depression and anxiety   Priority: High     Goal: Quality of Life Maintained   Start Date: 08/04/2021  Expected End Date: 10/14/2021  This Visit's Progress: On track  Recent Progress: On track  Priority: High  Note:   Current Barriers:  Acute Mental Health needs related to depression Financial constraints related to being out of work, Mental Health Concerns , and chronic pain/obesity Suicidal Ideation/Homicidal Ideation: No  Clinical Social Work Goal(s):  patient will work with SW     by telephone to reduce or manage symptoms related to depression demonstrate a reduction in symptoms related to :depression   Interventions: Patient shared with CSW she has completed the paperwork for weight loss surgery and is awaiting their follow up. She is hopeful with weight loss surgery she will have less aches and pain and able able to work. Pt  Patient interviewed and appropriate assessments performed: brief mental health assessment PHQ9 score of "7" last month- mild. Pt reports "about the same" . No SI/HI 1:1  collaboration with [name], MD regarding development and update of comprehensive plan of care as evidenced by provider attestation and co-signature Patient interviewed and appropriate assessments performed Referred patient to community resources care guide team for assistance with financial needs-  Advised patient to call [employer] [facility] Navigator ) to inquire about support services through them Motivational Interviewing  Solution-Focused Strategies    Emotional Support Provided Provided brief CBT  Reviewed mental health medications with patient and discussed importance of compliance:  Participation in counseling encouraged   Patient Self Care Activities:  Self administers medications as prescribed Attends all scheduled provider appointments Calls provider office for new concerns or questions Ability for insight Independent living Motivation for treatment  Patient Coping Strengths:  Supportive Relationships Hopefulness Self Advocate Able to Communicate Effectively  Patient Self Care Deficits:  Declines being linked with mental health counseling due to being overwhelmed with life; CSW  stressed the benefit of having therapy to help her develop coping skills and to work through things.    Patient Goals:  -call insurance line for behavioral health support/services offered through their [facility] Navigator - avoid  negative self-talk - develop a plan to deal with triggers like holidays, anniversaries - have a plan for how to handle bad days - spend time or talk with others every day - watch for early signs of feeling worse - begin personal counseling - check out volunteer opportunities - start or continue a personal journal - talk about feelings with a friend, family or spiritual advisor - practice positive thinking and self-talk   Follow Up Plan: SW will follow up with patient by phone over the next 4-6 weeks        Follow Up Plan:  Appointment scheduled for SW follow up with client by phone on: 10/02/21      Reece Levy MSW, LCSW Licensed Clinical Social Systems analyst Primary Care [phone]

## 2021-08-21 NOTE — Patient Instructions (Signed)
Visit Information  PATIENT GOALS:  Goals Addressed   None     The patient verbalized understanding of instructions, educational materials, and care plan provided today and declined offer to receive copy of patient instructions, educational materials, and care plan.   Telephone follow up appointment with care management team member scheduled for:10/02/21  Eduard Clos MSW, LCSW Licensed Clinical Social Education officer, environmental Primary Care [phone]

## 2021-09-01 ENCOUNTER — Encounter: Payer: Self-pay | Admitting: Pulmonary Disease

## 2021-09-01 ENCOUNTER — Ambulatory Visit (INDEPENDENT_AMBULATORY_CARE_PROVIDER_SITE_OTHER): Payer: 59 | Admitting: Pulmonary Disease

## 2021-09-01 ENCOUNTER — Other Ambulatory Visit: Payer: Self-pay

## 2021-09-01 ENCOUNTER — Ambulatory Visit: Payer: 59 | Admitting: Gastroenterology

## 2021-09-01 VITALS — BP 120/80 | HR 95 | Temp 98.2°F | Ht 67.0 in | Wt 334.0 lb

## 2021-09-01 DIAGNOSIS — R0683 Snoring: Secondary | ICD-10-CM

## 2021-09-01 NOTE — Progress Notes (Signed)
Jennifer [name]    [id number]    07-03-1976  Primary Care Physician:Simpson, Milus Mallick, MD  Referring Physician: [name], MD [address] [place],  [place] [address]  Chief complaint:   Patient with a history of snoring, witnessed apneas  HPI:  Complains of daytime sleepiness  Significant snoring No significant dryness of her mouth in the mornings No significant headaches No sore throat Memory is good  Usually goes to bed about 11 PM to 12 AM Sometimes has difficulty falling asleep 3-4 awakenings  She is sleepy during the day and sometimes has to take a nap during the day  She does have high blood pressure that is well controlled, diabetes is controlled  Has chronic knee pain  History of depression not on medications  No pets, home environment is conducive to sleep Outpatient Encounter Medications as of 09/01/2021  Medication Sig   acetaminophen (TYLENOL) 500 MG tablet Take 1,000 mg by mouth every 6 (six) hours as needed for mild pain or moderate pain.   busPIRone (BUSPAR) 15 MG tablet TAKE 1 TABLET BY MOUTH 3 TIMES DAILY.   Cholecalciferol (VITAMIN D3) 1.25 MG (50000 UT) CAPS TAKE 1 CAPSULE BY MOUTH ONE TIME PER WEEK   clotrimazole-betamethasone (LOTRISONE) cream APPLY TO AFFECTED AREA TWICE A DAY   dicyclomine (BENTYL) 10 MG capsule TAKE 1 CAPSULE (10 MG TOTAL) BY MOUTH 2 (TWO) TIMES DAILY AS NEEDED FOR SPASMS.   fluticasone (FLONASE) 50 MCG/ACT nasal spray Place 2 sprays into both nostrils daily. (Patient taking differently: Place 2 sprays into both nostrils daily as needed for allergies.)   hydrochlorothiazide (HYDRODIURIL) 25 MG tablet TAKE 1 TABLET BY MOUTH EVERY DAY   hydrOXYzine (ATARAX/VISTARIL) 50 MG tablet TAKE ONE TABLET AT BEDTIME FOR SLEEP (Patient taking differently: Take 50 mg by mouth at bedtime as needed (sleep).)   INCASSIA 0.35 MG tablet TAKE 1 TABLET BY MOUTH EVERY DAY   megestrol (MEGACE) 40 MG tablet 3 tablets a  day for 5 days, 2 tablets a day for 5 days then 1 tablet daily   meloxicam (MOBIC) 15 MG tablet Take 15 mg by mouth daily.   metFORMIN (GLUCOPHAGE) 500 MG tablet TAKE 1 TABLET BY MOUTH EVERY DAY WITH BREAKFAST   oxybutynin (DITROPAN-XL) 10 MG 24 hr tablet Take 1 tablet (10 mg total) by mouth daily.   triamterene-hydrochlorothiazide (MAXZIDE) 75-50 MG tablet Take 1 tablet by mouth daily.   venlafaxine XR (EFFEXOR XR) 75 MG 24 hr capsule Take 1 capsule (75 mg total) by mouth daily with breakfast.   venlafaxine XR (EFFEXOR-XR) 150 MG 24 hr capsule TAKE 1 CAPSULE BY MOUTH DAILY WITH BREAKFAST.   [DISCONTINUED] amoxicillin-clavulanate (AUGMENTIN) 875-125 MG tablet Take 1 tablet by mouth 2 (two) times daily. (Patient not taking: Reported on 09/01/2021)   [DISCONTINUED] fluconazole (DIFLUCAN) 150 MG tablet Take one dose every 72 hours X 3. (Patient not taking: Reported on 09/01/2021)   No facility-administered encounter medications on file as of 09/01/2021.    Allergies as of 09/01/2021   (No Known Allergies)    Past Medical History:  Diagnosis Date   Abnormal vaginal Pap smear    Acid reflux    Alopecia    Anxiety    Arthritis    Depression    Diabetes mellitus without complication (HCC)    prediabetic, takes no meds   Diverticulosis    Family history of diabetes mellitus    GERD (gastroesophageal reflux  disease)    Hemorrhoids    History of recurrent UTIs    Hypertension    Obesity     Past Surgical History:  Procedure Laterality Date   APPENDECTOMY     COLONOSCOPY N/A 03/13/2014   Dr. Jena Gauss- grade 3 hemorrhoids, colonic diverticulosis bx= benign lymphoid polyp   COLONOSCOPY WITH PROPOFOL N/A 01/26/2021   Procedure: COLONOSCOPY WITH PROPOFOL;  Surgeon: Corbin Ade, MD;  [facility];  Service: Endoscopy;  Laterality: N/A;  AM   KNEE ARTHROSCOPY     right   POLYPECTOMY  01/26/2021   Procedure: POLYPECTOMY;  Surgeon: Corbin Ade, MD;  [facility];   Service: Endoscopy;;    Family History  Adopted: Yes  Problem Relation Age of Onset   Hypertension Mother    Diabetes Mother    Other Mother        cholangiocarcinoma, age 36, deceased   Colon cancer Neg Hx    Tuberous sclerosis Neg Hx    Alpha-1 antitrypsin deficiency Neg Hx     Social History   Socioeconomic History   Marital status: Single    Spouse name: Not on file   Number of children: 4   Years of education: Not on file   Highest education level: Some college, no degree  Occupational History   Occupation: UNEMPLOYWED SINCE 10/2010  Tobacco Use   Smoking status: Never   Smokeless tobacco: Never  Vaping Use   Vaping Use: Never used  Substance and Sexual Activity   Alcohol use: No   Drug use: No   Sexual activity: Yes    Birth control/protection: Pill  Other Topics Concern   Not on file  Social History Narrative   Not on file   Social Determinants of Health   Financial Resource Strain: High Risk   Difficulty of Paying Living Expenses: Hard  Food Insecurity: No Food Insecurity   Worried About Running Out of Food in the Last Year: Never true   Ran Out of Food in the Last Year: Never true  Transportation Needs: No Transportation Needs   Lack of Transportation (Medical): No   Lack of Transportation (Non-Medical): No  Physical Activity: Inactive   Days of Exercise per Week: 0 days   Minutes of Exercise per Session: 0 min  Stress: No Stress Concern Present   Feeling of Stress : Only a little  Social Connections: Moderately Isolated   Frequency of Communication with Friends and Family: More than three times a week   Frequency of Social Gatherings with Friends and Family: Three times a week   Attends Religious Services: 1 to 4 times per year   Active Member of Clubs or Organizations: No   Attends Banker Meetings: Never   Marital Status: Never married  Catering manager Violence: Not At Risk   Fear of Current or Ex-Partner: No   Emotionally  Abused: No   Physically Abused: No   Sexually Abused: No    Review of Systems  Constitutional:  Positive for fatigue.  Psychiatric/Behavioral:  Positive for sleep disturbance.    Vitals:   09/01/21 1021  BP: 120/80  Pulse: 95  Temp: 98.2 F (36.8 C)  SpO2: 95%     Physical Exam Constitutional:      Appearance: She is obese.  HENT:     Head: Normocephalic.     Nose: Nose normal.     Mouth/Throat:     Mouth: Mucous membranes are moist.  Comments: Macroglossia, crowded oropharynx, Mallampati 4 Eyes:     Pupils: Pupils are equal, round, and reactive to light.  Cardiovascular:     Rate and Rhythm: Normal rate and regular rhythm.     Heart sounds: No murmur heard.   No friction rub.  Pulmonary:     Effort: No respiratory distress.     Breath sounds: No stridor. No wheezing or rhonchi.  Musculoskeletal:     Cervical back: No rigidity or tenderness.  Neurological:     Mental Status: She is alert.  Psychiatric:        Mood and Affect: Mood normal.   Results of the Epworth flowsheet 09/01/2021  Sitting and reading 2  Watching TV 1  Sitting, inactive in a public place (e.g. a theatre or a meeting) 0  As a passenger in a car for an hour without a break 2  Lying down to rest in the afternoon when circumstances permit 2  Sitting and talking to someone 1  Sitting quietly after a lunch without alcohol 0  In a car, while stopped for a few minutes in traffic 0  Total score 8    Data Reviewed: No previous study available  Assessment:  Moderate likelihood of significant obstructive sleep apnea  Excessive daytime sleepiness  Obesity  History of diabetes, hypertension  Pathophysiology of sleep disordered breathing discussed with the patient Treatment options discussed with the patient  Plan/Recommendations: Will schedule patient for home sleep study  Will update results of once reviewed  Encouraged about weight loss efforts  Tentative follow-up in about 4  months  Virl Diamond MD [facility] 09/01/2021, 10:49 AM  CC: [name], MD

## 2021-09-01 NOTE — Patient Instructions (Signed)
Moderate probability of significant obstructive sleep apnea  We will schedule you for home sleep study  Continue weight loss efforts  We will see you back in about 4 months  Call with significant concerns   Sleep Apnea Sleep apnea affects breathing during sleep. It causes breathing to stop for 10 seconds or more, or to become shallow. People with sleep apnea usually snore loudly. It can also increase the risk of: Heart attack. Stroke. Being very overweight (obese). Diabetes. Heart failure. Irregular heartbeat. High blood pressure. The goal of treatment is to help you breathe normally again. What are the causes? The most common cause of this condition is a collapsed or blocked airway. There are three kinds of sleep apnea: Obstructive sleep apnea. This is caused by a blocked or collapsed airway. Central sleep apnea. This happens when the brain does not send the right signals to the muscles that control breathing. Mixed sleep apnea. This is a combination of obstructive and central sleep apnea. What increases the risk? Being overweight. Smoking. Having a small airway. Being older. Being female. Drinking alcohol. Taking medicines to calm yourself (sedatives or tranquilizers). Having family members with the condition. Having a tongue or tonsils that are larger than normal. What are the signs or symptoms? Trouble staying asleep. Loud snoring. Headaches in the morning. Waking up gasping. Dry mouth or sore throat in the morning. Being sleepy or tired during the day. If you are sleepy or tired during the day, you may also: Not be able to focus your mind (concentrate). Forget things. Get angry a lot and have mood swings. Feel sad (depressed). Have changes in your personality. Have less interest in sex, if you are female. Be unable to have an erection, if you are female. How is this treated?  Sleeping on your side. Using a medicine to get rid of mucus in your nose  (decongestant). Avoiding the use of alcohol, medicines to help you relax, or certain pain medicines (narcotics). Losing weight, if needed. Changing your diet. Quitting smoking. Using a machine to open your airway while you sleep, such as: An oral appliance. This is a mouthpiece that shifts your lower jaw forward. A CPAP device. This device blows air through a mask when you breathe out (exhale). An EPAP device. This has valves that you put in each nostril. A BPAP device. This device blows air through a mask when you breathe in (inhale) and breathe out. Having surgery if other treatments do not work. Follow these instructions at home: Lifestyle Make changes that your doctor recommends. Eat a healthy diet. Lose weight if needed. Avoid alcohol, medicines to help you relax, and some pain medicines. Do not smoke or use any products that contain nicotine or tobacco. If you need help quitting, ask your doctor. General instructions Take over-the-counter and prescription medicines only as told by your doctor. If you were given a machine to use while you sleep, use it only as told by your doctor. If you are having surgery, make sure to tell your doctor you have sleep apnea. You may need to bring your device with you. Keep all follow-up visits. Contact a doctor if: The machine that you were given to use during sleep bothers you or does not seem to be working. You do not get better. You get worse. Get help right away if: Your chest hurts. You have trouble breathing in enough air. You have an uncomfortable feeling in your back, arms, or stomach. You have trouble talking. One side of your  body feels weak. A part of your face is hanging down. These symptoms may be an emergency. Get help right away. Call your local emergency services (911 in the [place] Do not wait to see if the symptoms will go away. Do not drive yourself to the hospital. Summary This condition affects breathing during  sleep. The most common cause is a collapsed or blocked airway. The goal of treatment is to help you breathe normally while you sleep. This information is not intended to replace advice given to you by your health care provider. Make sure you discuss any questions you have with your health care provider. Document Revised: 10/10/2020 Document Reviewed: 10/10/2020 Elsevier Patient Education  2022 [employer].

## 2021-09-02 ENCOUNTER — Encounter: Payer: 59 | Attending: Family Medicine | Admitting: Nutrition

## 2021-09-02 DIAGNOSIS — R7303 Prediabetes: Secondary | ICD-10-CM | POA: Insufficient documentation

## 2021-09-02 DIAGNOSIS — I10 Essential (primary) hypertension: Secondary | ICD-10-CM | POA: Insufficient documentation

## 2021-09-02 NOTE — Patient Instructions (Addendum)
Goals  Increase more lower carb vegetables. Cut out candy. Get back into journaling for emotions and stress. Eat three meals per day   Lifestyle Medicine - Whole Food, Plant Predominant Nutrition is highly recommended: Eat Plenty of vegetables, Mushrooms, fruits, Legumes, Whole Grains, Nuts, seeds in lieu of processed meats, processed snacks/pastries red meat, poultry, eggs.    -It is better to avoid simple carbohydrates including: Cakes, Sweet Desserts, Ice Cream, Soda (diet and regular), Sweet Tea, Candies, Chips, Cookies, Store Bought Juices, Alcohol in Excess of  1-2 drinks a day, Lemonade,  Artificial Sweeteners, Doughnuts, Coffee Creamers, "Sugar-free" Products, etc, etc.  This is not a complete list.....  Exercise: If you are able: 30 -60 minutes a day ,4 days a week, or 150 minutes a week.  The longer the better.  Combine stretch, strength, and aerobic activities.  If you were told in the past that you have high risk for cardiovascular diseases, you may seek evaluation by your heart doctor prior to initiating moderate to intense exercise programs.

## 2021-09-02 NOTE — Progress Notes (Signed)
Medical Nutrition Therapy  Appointment Start time:  1330  Appointment End time:  1430  Primary concerns today: Obesity, Type 2 Dm  Referral diagnosis: E11.8, E66.01. Preferred learning style: auditory Learning readiness: Ready   NUTRITION ASSESSMENT  Since seeing Dr. Moshe Cipro, she has been eating more fruit and vegetables.  Is seeing a chiropractor for knees and back. Dr.Lineberry in [place] and sees an ortho MD in [place]. Doesn't cook a lot and has been eating out fast foods but trying to work on eating more at home.  She does UBER some. Feels overwhelmed in getting her health back into line and eating better. Has physical pain that limits her activity. Willing to work on weight loss to help elevate pain and improve DM.  Anthropometrics  Wt Readings from Last 3 Encounters:  09/09/21 (!) 335 lb 0.6 oz (152 kg)  09/02/21 (!) 335 lb (152 kg)  09/01/21 (!) 334 lb (151.5 kg)   Ht Readings from Last 3 Encounters:  09/09/21 5\' 7"  (1.702 m)  09/02/21 5\' 7"  (1.702 m)  09/01/21 5\' 7"  (1.702 m)   Body mass index is 52.47 kg/m. @BMIFA @ Facility age limit for growth percentiles is 20 years. Facility age limit for growth percentiles is 20 years.   Clinical Medical Hx: HTN, Obesity  Medications: .Metformin 500 mg once a day Labs:  Lab Results  Component Value Date   HGBA1C 6.5 (H) 05/06/2021   CMP Latest Ref Rng & Units 05/06/2021 11/25/2020 09/02/2020  Glucose 65 - 99 mg/dL 88 100(H) 87  BUN 6 - 24 mg/dL 13 19 10   Creatinine 0.57 - 1.00 mg/dL 0.85 0.88 0.88  Sodium 134 - 144 mmol/L 138 138 138  Potassium 3.5 - 5.2 mmol/L 4.7 3.7 4.5  Chloride 96 - 106 mmol/L 99 103 101  CO2 20 - 29 mmol/L 24 24 28   Calcium 8.7 - 10.2 mg/dL 10.0 9.3 9.3  Total Protein 6.0 - 8.5 g/dL 7.2 - -  Total Bilirubin 0.0 - 1.2 mg/dL <0.2 - -  Alkaline Phos 44 - 121 IU/L 164(H) - -  AST 0 - 40 IU/L 17 - -  ALT 0 - 32 IU/L 21 - -   Lab Results  Component Value Date   HGBA1C 6.5 (H) 05/06/2021    Notable  Signs/Symptoms: Tired, fatigued. Increased urination. UTI and yeast infections.  Lifestyle & Dietary Hx Lives by herself and has her daughter for 10 days. Had diverticulosis. Sees GI. Is on antibiotic now..  Estimated daily fluid intake: 40 oz Supplements:  Sleep: 6-8 Stress / self-care: none Current average weekly physical activity: ADL,   24-Hr Dietary Recall First Meal:  1-2 am: doritos  7 am nectarine, water V8 Juice, Snack:  Second Meal: Snack: 4  fried chicken tender, Kevita drink and water Third Meal: Salad spinach, cheese, [place], croutons, water Snack:  Beverages: Water, Nurse, children's   Estimated Energy Needs Calories: 1200 Carbohydrate: 135g Protein: 90g Fat: 33g   NUTRITION DIAGNOSIS  NB-1.1 Food and nutrition-related knowledge deficit As related to Type 2 DM and Obesity.  As evidenced by A1C 6.5% and BMI > 30.   NUTRITION INTERVENTION  Nutrition education (E-1) on the following topics:  Nutrition and Diabetes education provided on My Plate, CHO counting, meal planning, portion sizes, timing of meals, avoiding snacks between meals unless having a low blood sugar, target ranges for A1C and blood sugars, signs/symptoms and treatment of hyper/hypoglycemia, monitoring blood sugars, taking medications as prescribed, benefits of exercising 30 minutes per day and prevention  of complications of DM.   Handouts Provided Include  My Plate  Meal Plan Card Plant based meal plan/lifestyle Diabetes instructions  Learning Style & Readiness for Change Teaching method utilized: Visual & Auditory  Demonstrated degree of understanding via: Teach Back  Barriers to learning/adherence to lifestyle change: none  Goals Established by Pt Goals  Cut out late night snacking Drink water -100 oz per day Drink Kavita instead of snacking in the middle of the night. Increase low carb vegetables. Schedule time to go to the gym with your friend. Keep food journal Lose 1-2 lbs per week   Lifestyle Medicine - Whole Food, Plant Predominant Nutrition is highly recommended: Eat Plenty of vegetables, Mushrooms, fruits, Legumes, Whole Grains, Nuts, seeds in lieu of processed meats, processed snacks/pastries red meat, poultry, eggs.    -It is better to avoid simple carbohydrates including: Cakes, Sweet Desserts, Ice Cream, Soda (diet and regular), Sweet Tea, Candies, Chips, Cookies, Store Bought Juices, Alcohol in Excess of  1-2 drinks a day, Lemonade,  Artificial Sweeteners, Doughnuts, Coffee Creamers, "Sugar-free" Products, etc, etc.  This is not a complete list.....  Exercise: If you are able: 30 -60 minutes a day ,4 days a week, or 150 minutes a week.  The longer the better.  Combine stretch, strength, and aerobic activities.  If you were told in the past that you have high risk for cardiovascular diseases, you may seek evaluation by your heart doctor prior to initiating moderate to intense exercise programs.   MONITORING & EVALUATION Dietary intake, weekly physical activity, and blood sugars in 1 month.  Next Steps  Patient is to work on meal planning and better food choices.Marland Kitchen

## 2021-09-04 ENCOUNTER — Other Ambulatory Visit: Payer: Self-pay

## 2021-09-04 ENCOUNTER — Ambulatory Visit
Admission: RE | Admit: 2021-09-04 | Discharge: 2021-09-04 | Disposition: A | Discharge status: Home / Self Care | Payer: 59 | Source: Ambulatory Visit | Attending: Family Medicine | Admitting: Family Medicine

## 2021-09-04 IMAGING — MG MM DIGITAL SCREENING BILAT W/ TOMO AND CAD
6 of 10 series · 6 of 30 positions shown · non-contrast
Comparison: Previous exam(s).

ACR Breast Density Category a: The breast tissue is almost entirely
fatty.

CLINICAL DATA: Screening.

EXAM:
DIGITAL SCREENING BILATERAL MAMMOGRAM WITH TOMOSYNTHESIS AND CAD
TECHNIQUE: Bilateral screening digital craniocaudal and mediolateral oblique
mammograms were obtained. Bilateral screening digital breast
tomosynthesis was performed. The images were evaluated with
computer-aided detection.

[L MLO synth-2D (1 of 2)]
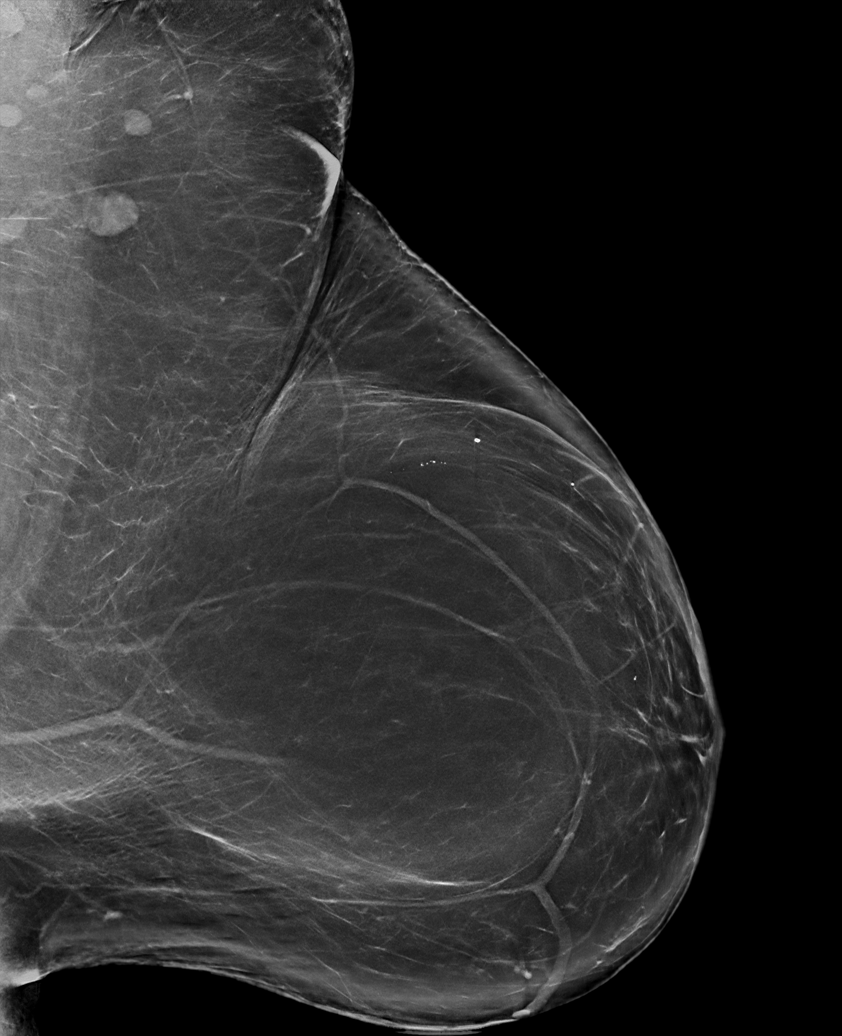

[L CC synth-2D]
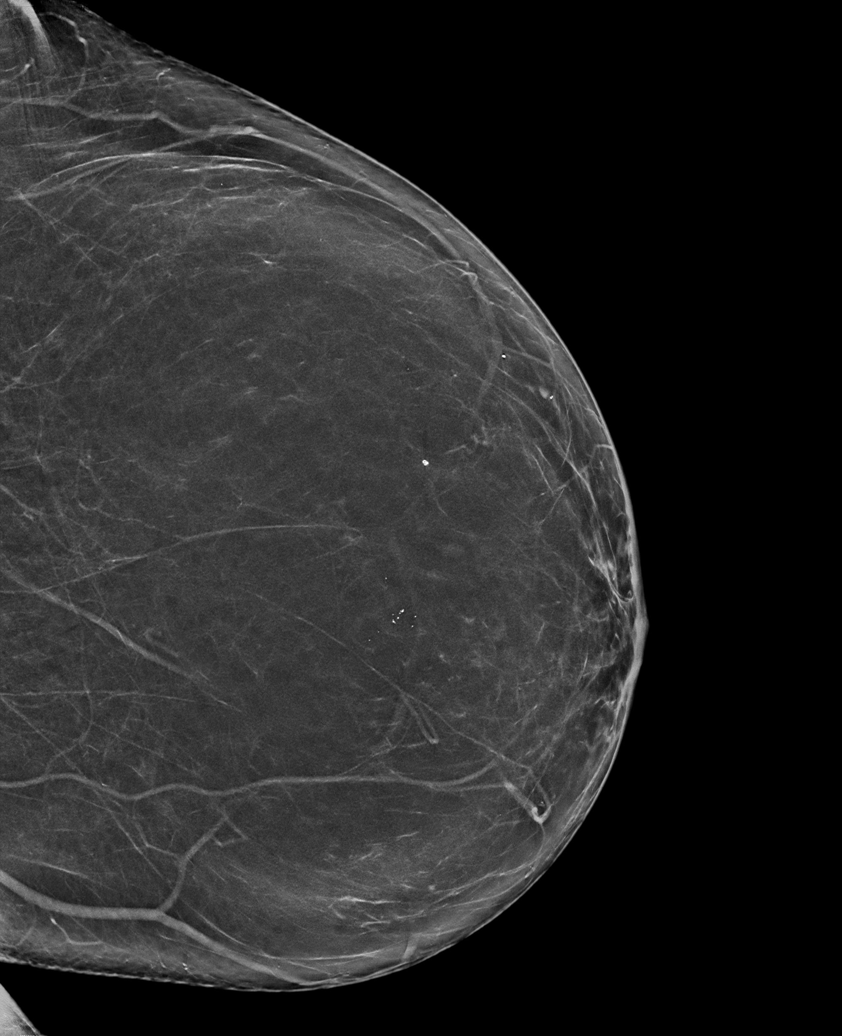

[R CC synth-2D]
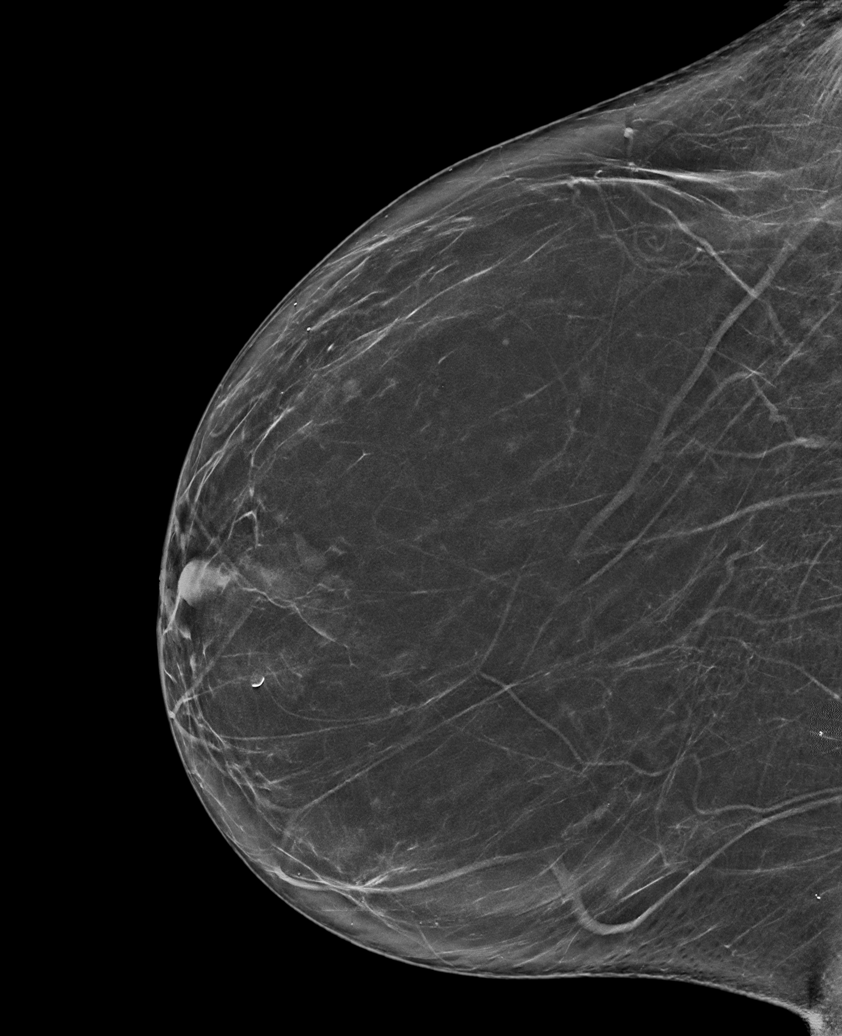

[R MLO synth-2D]
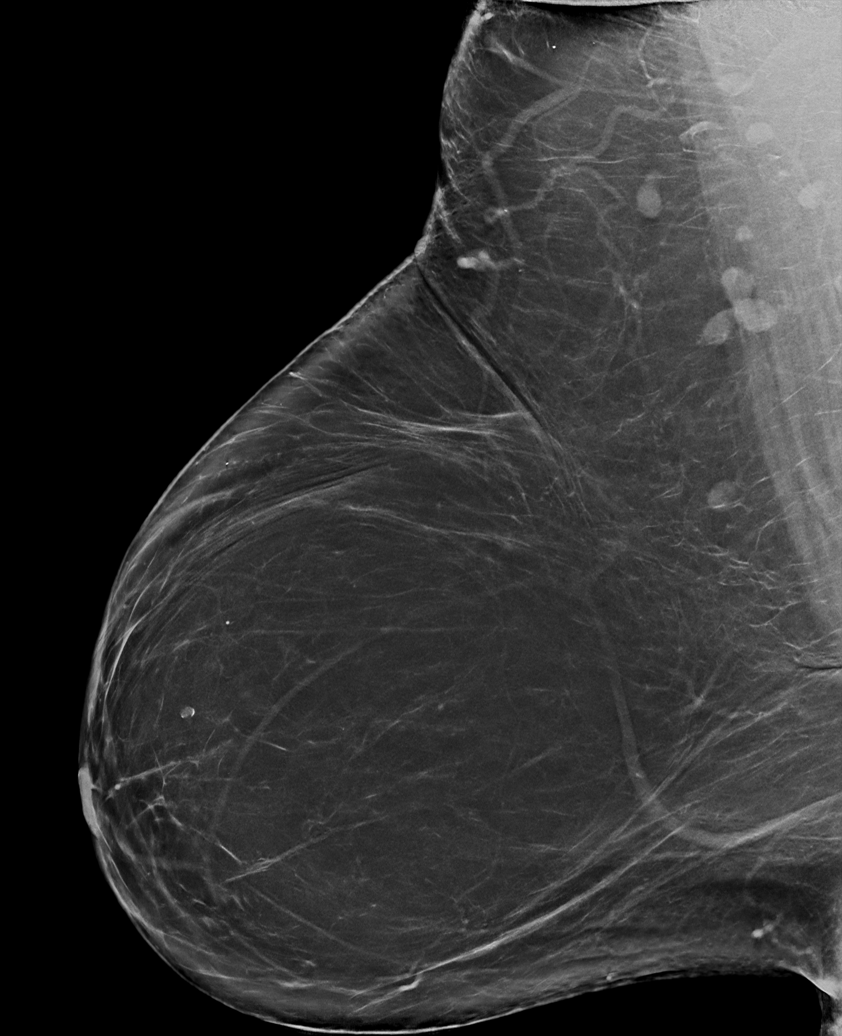

[L MLO synth-2D (2 of 2)]
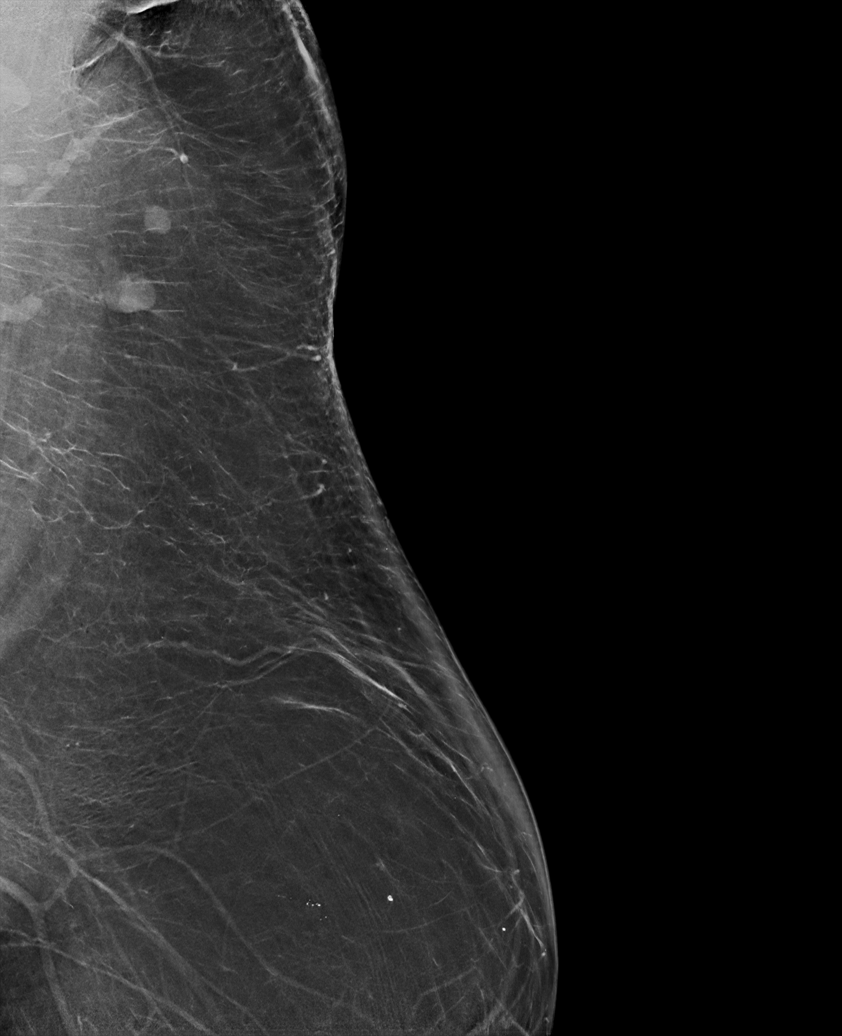

[L MLO tomo · tomo slice 51/100.0]
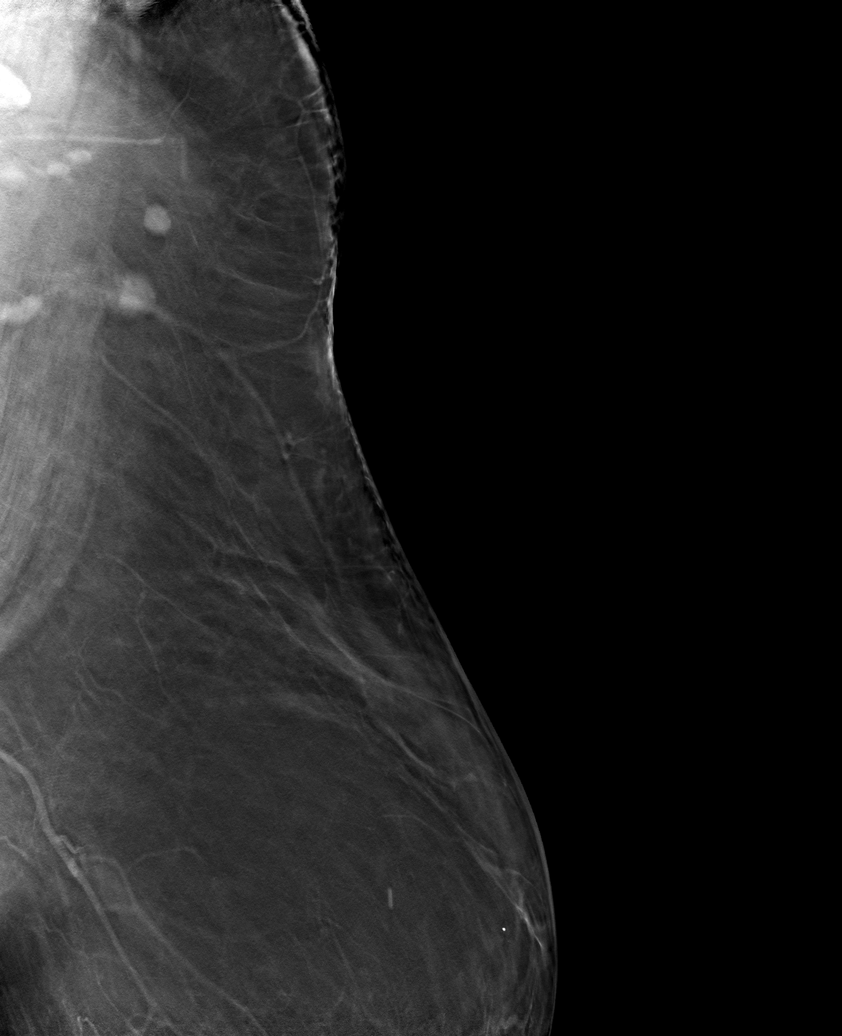

[6 of 30 positions shown; findings below may reference images not displayed]

FINDINGS: In the left breast, calcifications warrant further evaluation. In
the right breast, no findings suspicious for malignancy.
IMPRESSION: Further evaluation is suggested for calcifications in the left
breast.

RECOMMENDATION:
Diagnostic mammogram of the left breast. (Code:[VL])

The patient will be contacted regarding the findings, and additional
imaging will be scheduled.

BI-RADS CATEGORY  0: Incomplete. Need additional imaging evaluation
and/or prior mammograms for comparison.

## 2021-09-09 ENCOUNTER — Other Ambulatory Visit (HOSPITAL_COMMUNITY): Payer: Self-pay | Admitting: Family Medicine

## 2021-09-09 ENCOUNTER — Ambulatory Visit (INDEPENDENT_AMBULATORY_CARE_PROVIDER_SITE_OTHER): Payer: 59 | Admitting: Family Medicine

## 2021-09-09 ENCOUNTER — Encounter: Payer: Self-pay | Admitting: Family Medicine

## 2021-09-09 ENCOUNTER — Other Ambulatory Visit: Payer: Self-pay

## 2021-09-09 VITALS — BP 125/87 | HR 100 | Resp 19 | Ht 67.0 in | Wt 335.0 lb

## 2021-09-09 DIAGNOSIS — M1990 Unspecified osteoarthritis, unspecified site: Secondary | ICD-10-CM

## 2021-09-09 DIAGNOSIS — I10 Essential (primary) hypertension: Secondary | ICD-10-CM | POA: Diagnosis not present

## 2021-09-09 DIAGNOSIS — Z1231 Encounter for screening mammogram for malignant neoplasm of breast: Secondary | ICD-10-CM

## 2021-09-09 DIAGNOSIS — E118 Type 2 diabetes mellitus with unspecified complications: Secondary | ICD-10-CM | POA: Diagnosis not present

## 2021-09-09 DIAGNOSIS — F418 Other specified anxiety disorders: Secondary | ICD-10-CM

## 2021-09-09 DIAGNOSIS — Z23 Encounter for immunization: Secondary | ICD-10-CM | POA: Diagnosis not present

## 2021-09-09 MED ORDER — TIRZEPATIDE 2.5 MG/0.5ML ~~LOC~~ SOAJ: 2.5000 mg | SUBCUTANEOUS | 1 refills | Status: DC

## 2021-09-09 NOTE — Assessment & Plan Note (Signed)
  Patient re-educated about  the importance of commitment to a  minimum of 150 minutes of exercise per week as able.  The importance of healthy food choices with portion control discussed, as well as eating regularly and within a 12 hour window most days. The need to choose "clean , green" food 50 to 75% of the time is discussed, as well as to make water the primary drink and set a goal of 64 ounces water daily.    Weight /BMI 09/09/2021 09/02/2021 09/01/2021  WEIGHT 335 lb 0.6 oz 335 lb 334 lb  HEIGHT 5\' 7"  5\' 7"  5\' 7"   BMI 52.47 kg/m2 52.47 kg/m2 52.31 kg/m2    Start mounjaro

## 2021-09-09 NOTE — Assessment & Plan Note (Signed)
Controlled, no change in medication DASH diet and commitment to daily physical activity for a minimum of 30 minutes discussed and encouraged, as a part of hypertension management. The importance of attaining a healthy weight is also discussed.  BP/Weight 09/09/2021 09/02/2021 09/01/2021 07/14/2021 07/07/2021 07/01/2021 1/61/0960  Systolic BP 454 - 098 119 - 147 829  Diastolic BP 87 - 80 92 - 88 77  Wt. (Lbs) 335.04 335 334 337 337 337.08 337  BMI 52.47 52.47 52.31 52.78 52.78 52.79 52.78

## 2021-09-09 NOTE — Assessment & Plan Note (Signed)
Significant arthritis in back and knees, ambulates with a cane at times, handicap parking sticker prepared

## 2021-09-09 NOTE — Patient Instructions (Addendum)
Annual exam in 7 weeks, call if you need me sooner  Flu and pneumonia 20 vaccines  Please get covid booster at pharmacy  New for diabetes is mounjaro weekly, nurse please give coupon, stop metformin once you start this  Call gyne re megace  Please give 1200 and 1500 cal diet sheets  Call for diagnostic mammogram (patient)  Handicap sticker   Thanks for choosing [facility], we consider it a privelige to serve you.

## 2021-09-09 NOTE — Assessment & Plan Note (Signed)
Ms. Jennifer [name] is reminded of the importance of commitment to daily physical activity for 30 minutes or more, as able and the need to limit carbohydrate intake to 30 to 60 grams per meal to help with blood sugar control.   The need to take medication as prescribed, test blood sugar as directed, and to call between visits if there is a concern that blood sugar is uncontrolled is also discussed.   Jennifer [name] is reminded of the importance of daily foot exam, annual eye examination, and good blood sugar, blood pressure and cholesterol control.  Diabetic Labs Latest Ref Rng & Units 05/06/2021 11/25/2020 09/04/2020 09/02/2020 05/29/2020  HbA1c 4.8 - 5.6 % 6.5(H) - 6.4(H) - -  Chol 100 - 199 mg/dL 152 - - - 136  HDL >39 mg/dL 49 - - - 58  Calc LDL 0 - 99 mg/dL 88 - - - 62  Triglycerides 0 - 149 mg/dL 79 - - - 76  Creatinine 0.57 - 1.00 mg/dL 0.85 0.88 - 0.88 0.79   BP/Weight 09/09/2021 09/02/2021 09/01/2021 07/14/2021 07/07/2021 07/01/2021 9/43/7005  Systolic BP 259 - 102 890 - 228 406  Diastolic BP 87 - 80 92 - 88 77  Wt. (Lbs) 335.04 335 334 337 337 337.08 337  BMI 52.47 52.47 52.31 52.78 52.78 52.79 52.78   No flowsheet data found.    Start mounjaro and stop metformin

## 2021-09-09 NOTE — Progress Notes (Signed)
TESHAWNA [name]     MRN: [id number]      DOB: 14-Jan-1976   HPI Jennifer [name] is here for follow up and re-evaluation of chronic medical conditions, medication management and review of any available recent lab and radiology data.  Preventive health is updated, specifically  Cancer screening and Immunization.   Left knee pain since 08/09/2021, nisbeing managed by Ortho, requests handicap sticker Recently put on megace for bleeding but notes increased appetite wants this changed as weight remains a major struggle Depression is better, does not need therapy  ROS Denies recent fever or chills. Denies sinus pressure, nasal congestion, ear pain or sore throat. Denies chest congestion, productive cough or wheezing. Denies chest pains, palpitations and leg swelling Denies abdominal pain, nausea, vomiting,diarrhea or constipation.   Denies dysuria, frequency, hesitancy or incontinence.  Denies headaches, seizures, numbness, or tingling. Denies uncontrolled  depression, anxiety or insomnia. Denies skin break down or rash.   PE  BP 125/87   Pulse 100   Resp 19   Ht 5\' 7"  (1.702 m)   Wt (!) 335 lb 0.6 oz (152 kg)   LMP 08/28/2021 (Approximate)   SpO2 98%   BMI 52.47 kg/m   Patient alert and oriented and in no cardiopulmonary distress.  HEENT: No facial asymmetry, EOMI,     Neck supple .  Chest: Clear to auscultation bilaterally.  CVS: S1, S2 no murmurs, no S3.Regular rate.  ABD: Soft non tender.   Ext: No edema  MS: Adequate ROM spine, shoulders, hips and markedly reduced in left knee.  Skin: Intact, no ulcerations or rash noted.  Psych: Good eye contact, normal affect. Memory intact not anxious or depressed appearing.  CNS: CN 2-12 intact, power,  normal throughout.no focal deficits noted.   Assessment & Plan  Controlled diabetes mellitus type 2 with complications [facility]) [name] is reminded of the importance of commitment to daily physical activity for 30 minutes or  more, as able and the need to limit carbohydrate intake to 30 to 60 grams per meal to help with blood sugar control.   The need to take medication as prescribed, test blood sugar as directed, and to call between visits if there is a concern that blood sugar is uncontrolled is also discussed.   [name] is reminded of the importance of daily foot exam, annual eye examination, and good blood sugar, blood pressure and cholesterol control.  Diabetic Labs Latest Ref Rng & Units 05/06/2021 11/25/2020 09/04/2020 09/02/2020 05/29/2020  HbA1c 4.8 - 5.6 % 6.5(H) - 6.4(H) - -  Chol 100 - 199 mg/dL 034 - - - 742  HDL >59 mg/dL 49 - - - 58  Calc LDL 0 - 99 mg/dL 88 - - - 62  Triglycerides 0 - 149 mg/dL 79 - - - 76  Creatinine 0.57 - 1.00 mg/dL 5.63 8.75 - 6.43 3.29   BP/Weight 09/09/2021 09/02/2021 09/01/2021 07/14/2021 07/07/2021 07/01/2021 06/08/2021  Systolic BP 125 - 120 132 - 140 114  Diastolic BP 87 - 80 92 - 88 77  Wt. (Lbs) 335.04 335 334 337 337 337.08 337  BMI 52.47 52.47 52.31 52.78 52.78 52.79 52.78   No flowsheet data found.    Start mounjaro and stop metformin  Depression with anxiety Improved and controlled on current meds , continue same  Essential hypertension Controlled, no change in medication DASH diet and commitment to daily physical activity for a minimum of 30 minutes discussed and encouraged, as a part of hypertension management.  The importance of attaining a healthy weight is also discussed.  BP/Weight 09/09/2021 09/02/2021 09/01/2021 07/14/2021 07/07/2021 07/01/2021 06/08/2021  Systolic BP 125 - 120 132 - 140 114  Diastolic BP 87 - 80 92 - 88 77  Wt. (Lbs) 335.04 335 334 337 337 337.08 337  BMI 52.47 52.47 52.31 52.78 52.78 52.79 52.78       Morbid obesity (HCC)  Patient re-educated about  the importance of commitment to a  minimum of 150 minutes of exercise per week as able.  The importance of healthy food choices with portion control discussed, as well as  eating regularly and within a 12 hour window most days. The need to choose "clean , green" food 50 to 75% of the time is discussed, as well as to make water the primary drink and set a goal of 64 ounces water daily.    Weight /BMI 09/09/2021 09/02/2021 09/01/2021  WEIGHT 335 lb 0.6 oz 335 lb 334 lb  HEIGHT 5\' 7"  5\' 7"  5\' 7"   BMI 52.47 kg/m2 52.47 kg/m2 52.31 kg/m2    Start mounjaro  Generalized arthritis Significant arthritis in back and knees, ambulates with a cane at times, handicap parking sticker prepared

## 2021-09-09 NOTE — Assessment & Plan Note (Signed)
Improved and controlled on current meds , continue same

## 2021-09-10 ENCOUNTER — Other Ambulatory Visit (HOSPITAL_COMMUNITY): Payer: Self-pay | Admitting: Family Medicine

## 2021-09-10 DIAGNOSIS — R928 Other abnormal and inconclusive findings on diagnostic imaging of breast: Secondary | ICD-10-CM

## 2021-09-23 ENCOUNTER — Ambulatory Visit (HOSPITAL_COMMUNITY): Payer: 59

## 2021-09-24 ENCOUNTER — Encounter: Payer: Self-pay | Admitting: Nutrition

## 2021-10-02 ENCOUNTER — Telehealth: Payer: 59 | Admitting: *Deleted

## 2021-10-05 ENCOUNTER — Ambulatory Visit: Payer: 59 | Admitting: Urology

## 2021-10-11 ENCOUNTER — Other Ambulatory Visit: Payer: Self-pay | Admitting: Obstetrics & Gynecology

## 2021-10-11 ENCOUNTER — Other Ambulatory Visit: Payer: Self-pay | Admitting: Family Medicine

## 2021-10-12 ENCOUNTER — Ambulatory Visit: Payer: 59 | Admitting: Obstetrics & Gynecology

## 2021-10-13 ENCOUNTER — Other Ambulatory Visit: Payer: Self-pay

## 2021-10-13 ENCOUNTER — Ambulatory Visit (HOSPITAL_COMMUNITY)
Admission: RE | Admit: 2021-10-13 | Discharge: 2021-10-13 | Disposition: A | Discharge status: Home / Self Care | Payer: 59 | Source: Ambulatory Visit | Attending: Family Medicine | Admitting: Family Medicine

## 2021-10-13 DIAGNOSIS — R928 Other abnormal and inconclusive findings on diagnostic imaging of breast: Secondary | ICD-10-CM | POA: Insufficient documentation

## 2021-10-13 IMAGING — MG MM DIGITAL DIAGNOSTIC UNILAT*L* W/ TOMO W/ CAD
4 series · 4 of 8 positions shown · non-contrast
Comparison: Previous exams.

ACR Breast Density Category a: The breast tissue is almost entirely
fatty.

CLINICAL DATA: Screening recall for left breast calcifications.

EXAM:
DIGITAL DIAGNOSTIC UNILATERAL LEFT MAMMOGRAM WITH TOMOSYNTHESIS AND
CAD
TECHNIQUE: Left digital diagnostic mammography and breast tomosynthesis was
performed. The images were evaluated with computer-aided detection.

[L CC]
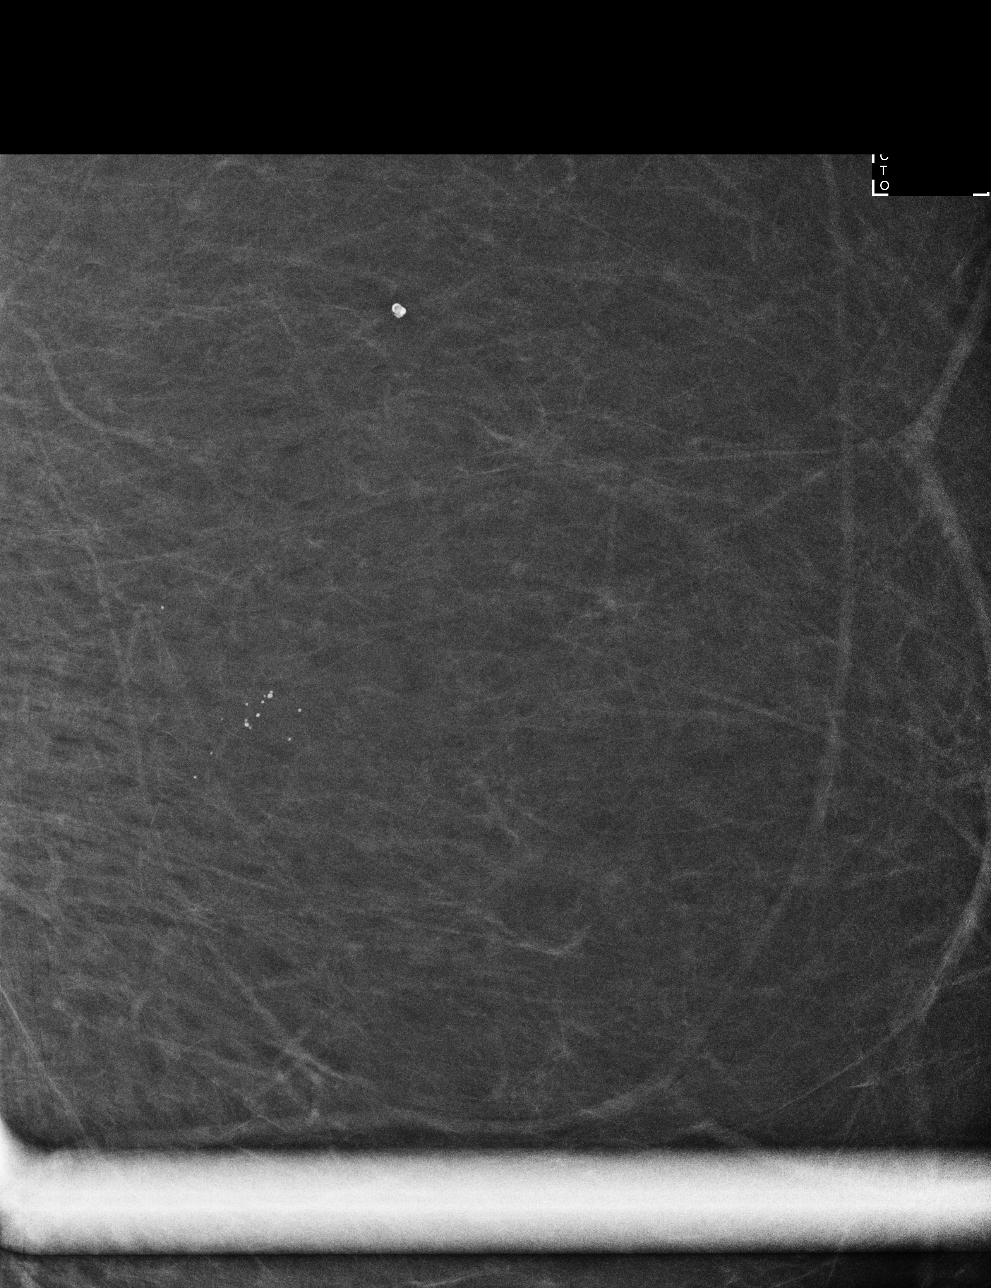

[L ML]
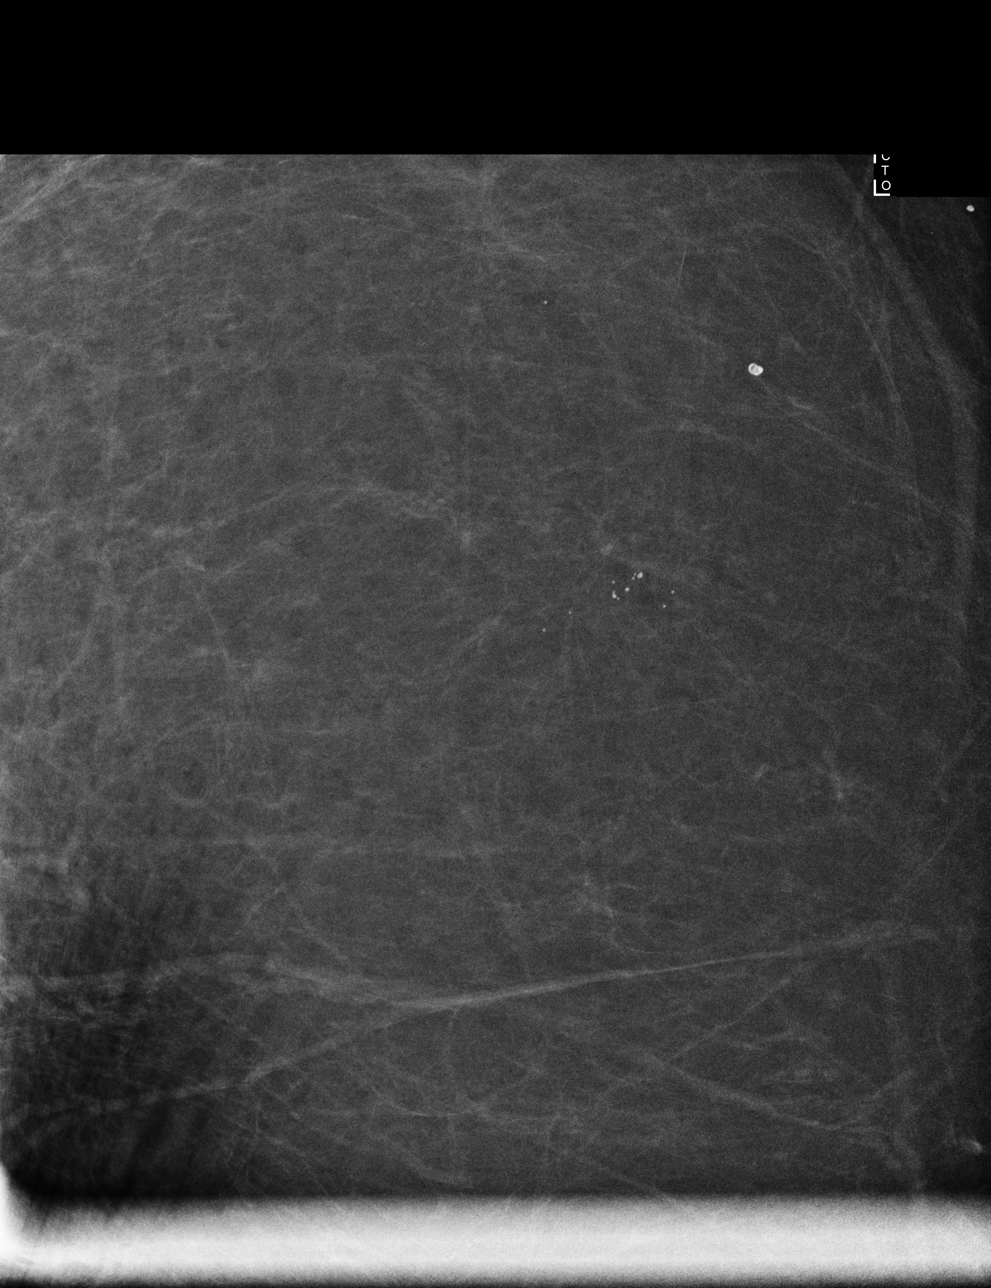

[L ML synth-2D]
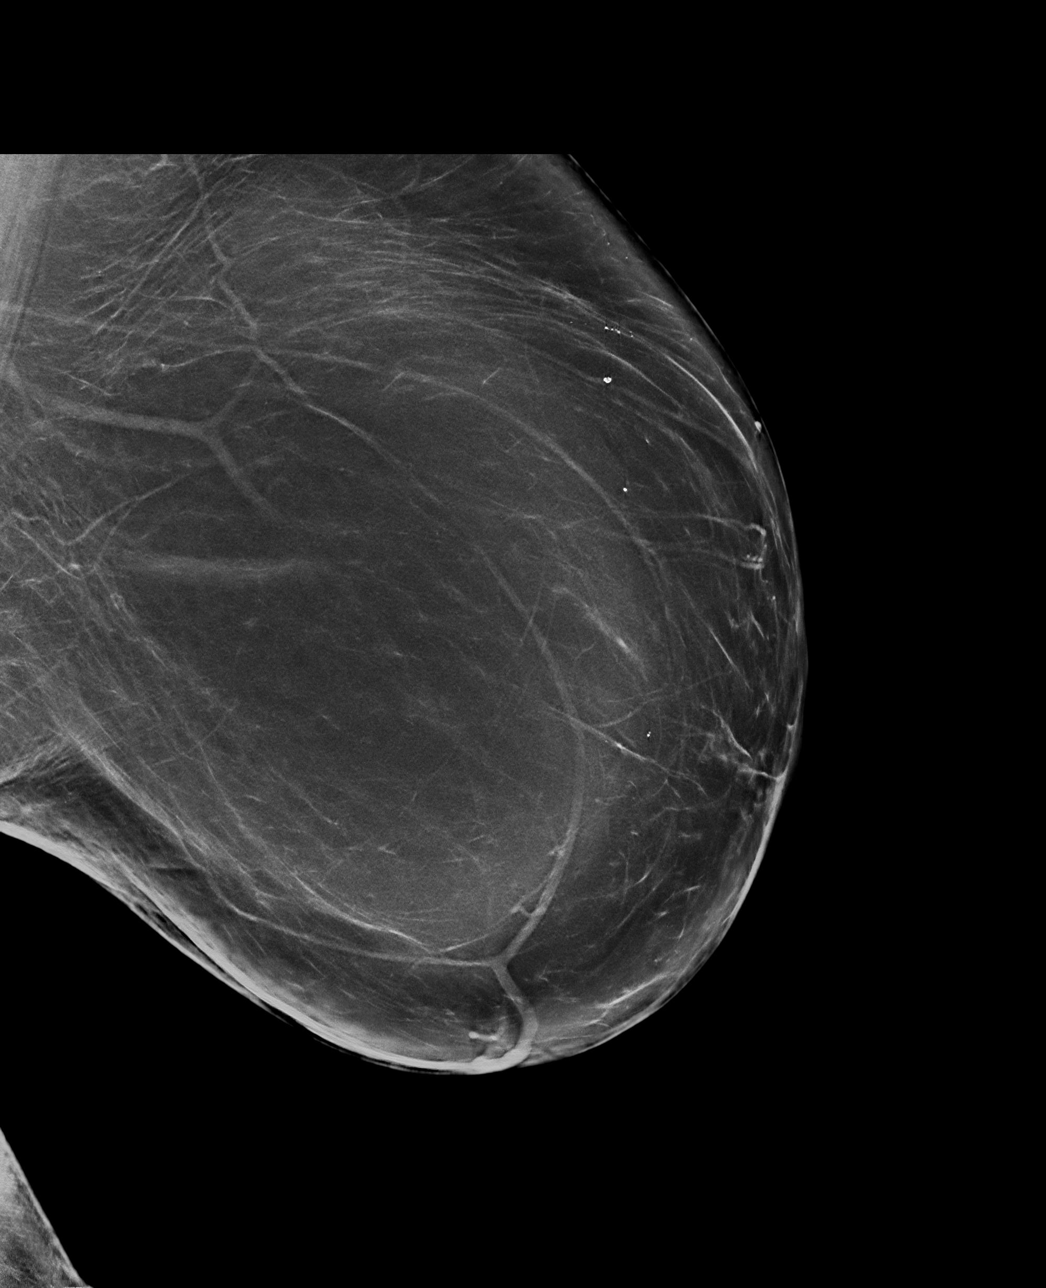

[L ML tomo · tomo slice 48/95.0]
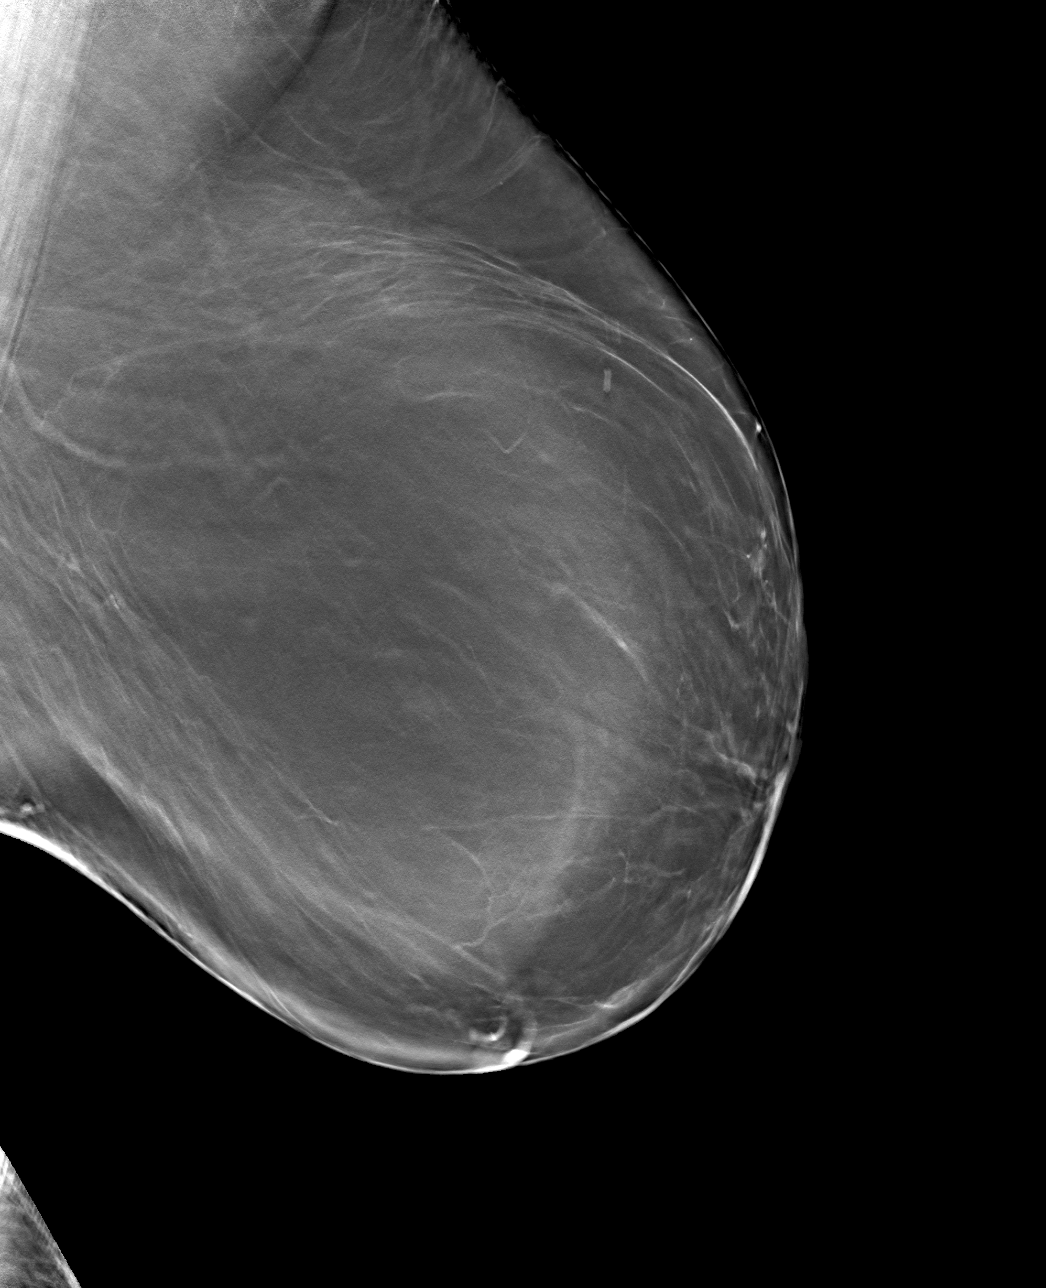

[4 of 8 positions shown; findings below may reference images not displayed]

FINDINGS: Spot compression magnification views were performed over the upper
central left breast demonstrating a 1.4 cm group round
calcifications, 1 of which may be lucent centered.
IMPRESSION: Probably benign left breast calcifications.

RECOMMENDATION:
Recommend diagnostic mammography with magnification views of the
left breast in 6 months.

I have discussed the findings and recommendations with the patient.
If applicable, a reminder letter will be sent to the patient
regarding the next appointment.

BI-RADS CATEGORY  3: Probably benign.

## 2021-10-20 NOTE — Progress Notes (Signed)
No show

## 2021-11-04 ENCOUNTER — Encounter: Payer: Self-pay | Admitting: Family Medicine

## 2021-11-04 ENCOUNTER — Encounter: Payer: Self-pay | Admitting: Obstetrics & Gynecology

## 2021-11-05 ENCOUNTER — Other Ambulatory Visit: Payer: Self-pay | Admitting: Family Medicine

## 2021-11-24 ENCOUNTER — Other Ambulatory Visit: Payer: Self-pay

## 2021-11-24 ENCOUNTER — Encounter: Payer: Self-pay | Admitting: Nutrition

## 2021-11-24 ENCOUNTER — Encounter: Payer: 59 | Attending: Family Medicine | Admitting: Nutrition

## 2021-11-24 DIAGNOSIS — E118 Type 2 diabetes mellitus with unspecified complications: Secondary | ICD-10-CM | POA: Diagnosis present

## 2021-11-24 NOTE — Progress Notes (Signed)
Medical Nutrition Therapy Obesity Follow up Appointment Start time:  61 Appointment End time:  1115  Primary concerns today: Obesity, Type 2 Dm  Referral diagnosis: E11.8, E66.01. Preferred learning style: auditory Learning readiness: Ready   NUTRITION ASSESSMENT Follow up Lost 9 lbs.  Has been following the meal plan. Cut out  snacks and eating between meals and late at night. Feels much since eating healthier foods. Started a food journal and then stopped but that helped her get engaged with what she needed to do for her weight loss. Had been taking Monjero for a month. Will take it again once it comes back on the market.  Continues to have a lot of leg and back pain. PCP Dr. Moshe Cipro  Anthropometrics  Wt Readings from Last 3 Encounters:  09/09/21 (!) 335 lb 0.6 oz (152 kg)  09/02/21 (!) 335 lb (152 kg)  09/01/21 (!) 334 lb (151.5 kg)   Ht Readings from Last 3 Encounters:  09/09/21 5\' 7"  (1.702 m)  09/02/21 5\' 7"  (1.702 m)  09/01/21 5\' 7"  (1.702 m)   There is no height or weight on file to calculate BMI. @BMIFA @ Facility age limit for growth percentiles is 20 years. Facility age limit for growth percentiles is 20 years.   Clinical Medical Hx: HTN, Obesity  Medications: .Metformin 500 mg once a day Labs:  Lab Results  Component Value Date   HGBA1C 6.5 (H) 05/06/2021   CMP Latest Ref Rng & Units 05/06/2021 11/25/2020 09/02/2020  Glucose 65 - 99 mg/dL 88 100(H) 87  BUN 6 - 24 mg/dL 13 19 10   Creatinine 0.57 - 1.00 mg/dL 0.85 0.88 0.88  Sodium 134 - 144 mmol/L 138 138 138  Potassium 3.5 - 5.2 mmol/L 4.7 3.7 4.5  Chloride 96 - 106 mmol/L 99 103 101  CO2 20 - 29 mmol/L 24 24 28   Calcium 8.7 - 10.2 mg/dL 10.0 9.3 9.3  Total Protein 6.0 - 8.5 g/dL 7.2 - -  Total Bilirubin 0.0 - 1.2 mg/dL <0.2 - -  Alkaline Phos 44 - 121 IU/L 164(H) - -  AST 0 - 40 IU/L 17 - -  ALT 0 - 32 IU/L 21 - -   Lab Results  Component Value Date   HGBA1C 6.5 (H) 05/06/2021    Notable  Signs/Symptoms: Tired, fatigued. Increased urination. UTI and yeast infections.  Lifestyle & Dietary Hx Lives by herself and has her daughter for 10 days. Had diverticulosis. Sees GI. Is on antibiotic now..  Estimated daily fluid intake: 80z Supplements:  Sleep: 6-8 Stress / self-care: none Current average weekly physical activity: ADL,   24-Hr Dietary Recall First Meal:  5 am Banana   8 am orange Lunch broccoli and cheese Dinner Broccoli,  Beverages: Water, Nurse, children's   Estimated Energy Needs Calories: 1200 Carbohydrate: 135g Protein: 90g Fat: 33g   NUTRITION DIAGNOSIS  NB-1.1 Food and nutrition-related knowledge deficit As related to Type 2 DM and Obesity.  As evidenced by A1C 6.5% and BMI > 30.   NUTRITION INTERVENTION  Nutrition education (E-1) on the following topics:  Plant based nutrition  Handouts Provided Include  Nutrient density of foods. Plant based meal plan/lifestyle Diabetes instructions  Learning Style & Readiness for Change Teaching method utilized: Visual & Auditory  Demonstrated degree of understanding via: Teach Back  Barriers to learning/adherence to lifestyle change: none  Goals Established by Pt Goals Drink water -100 oz per day Increase low carb vegetables andn protein Increase high fiber foods. Schedule time to go  to the gym with your friend. Lose 1-2 lbs per week    Lifestyle Medicine - Whole Food, Plant Predominant Nutrition is highly recommended: Eat Plenty of vegetables, Mushrooms, fruits, Legumes, Whole Grains, Nuts, seeds in lieu of processed meats, processed snacks/pastries red meat, poultry, eggs.    -It is better to avoid simple carbohydrates including: Cakes, Sweet Desserts, Ice Cream, Soda (diet and regular), Sweet Tea, Candies, Chips, Cookies, Store Bought Juices, Alcohol in Excess of  1-2 drinks a day, Lemonade,  Artificial Sweeteners, Doughnuts, Coffee Creamers, "Sugar-free" Products, etc, etc.  This is not a complete  list.....  Exercise: If you are able: 30 -60 minutes a day ,4 days a week, or 150 minutes a week.  The longer the better.  Combine stretch, strength, and aerobic activities.  If you were told in the past that you have high risk for cardiovascular diseases, you may seek evaluation by your heart doctor prior to initiating moderate to intense exercise programs.   MONITORING & EVALUATION Dietary intake, weekly physical activity, and blood sugars in 1 month.  Recommend to start Metformin 500 mg BID for DM Type 2.   Next Steps  Patient is to work on meal planning and better food choices.Marland Kitchen

## 2021-11-24 NOTE — Patient Instructions (Signed)
°  Goals Established by Pt Goals Drink water -100 oz per day Increase low carb vegetables and protein of fish, chicken, beans or eggs. Increase high fiber foods. Schedule time to go to the gym with your friend. Lose 1-2 lbs per week  Avoid dairy products

## 2021-11-25 ENCOUNTER — Ambulatory Visit (INDEPENDENT_AMBULATORY_CARE_PROVIDER_SITE_OTHER): Payer: 59

## 2021-11-25 DIAGNOSIS — R0683 Snoring: Secondary | ICD-10-CM

## 2021-11-25 DIAGNOSIS — G4733 Obstructive sleep apnea (adult) (pediatric): Secondary | ICD-10-CM | POA: Diagnosis not present

## 2021-11-26 ENCOUNTER — Telehealth: Payer: Self-pay | Admitting: *Deleted

## 2021-11-26 NOTE — Telephone Encounter (Signed)
Care Management   Follow Up Note   11/26/2021 Name: Jennifer [name] MRN: [id number] DOB: 07-29-1976   Referred by: [name], MD Reason for referral : No chief complaint on file.   An unsuccessful telephone outreach was attempted today. The patient was referred to the case management team for assistance with care management and care coordination.   Follow Up Plan: The care management team will reach out to the patient again over the next 7-10 days.  Reece Levy MSW, LCSW Licensed Occupational psychologist Primary Care [phone]

## 2021-11-27 ENCOUNTER — Telehealth: Payer: Self-pay | Admitting: Pulmonary Disease

## 2021-11-27 ENCOUNTER — Encounter: Payer: Self-pay | Admitting: Pulmonary Disease

## 2021-11-27 DIAGNOSIS — G4733 Obstructive sleep apnea (adult) (pediatric): Secondary | ICD-10-CM | POA: Diagnosis not present

## 2021-11-27 NOTE — Telephone Encounter (Signed)
MyChart message sent to patient.

## 2021-11-27 NOTE — Telephone Encounter (Signed)
Call patient  Sleep study result  Date of study: 11/26/2021  Impression: Mild obstructive sleep apnea  Recommendation:  Options of treatment for mild sleep disordered breathing will include   1.  CPAP therapy if there is significant daytime sleepiness or other comorbidities including history of CVA or cardiac disease -If CPAP is chosen as an option of treatment auto titrating CPAP with a pressure setting of 5-15 will be appropriate   2.  Watchful waiting with emphasis on weight loss, sleep position modification to optimize lateral sleep, elevating the head of the bed by about 30 degrees may also help   3.  An oral device may be fashioned for the treatment of mild sleep disordered breathing, will involve referral to dentist.  Follow-up as previously scheduled

## 2021-12-02 ENCOUNTER — Other Ambulatory Visit: Payer: Self-pay | Admitting: Family Medicine

## 2021-12-03 ENCOUNTER — Ambulatory Visit (INDEPENDENT_AMBULATORY_CARE_PROVIDER_SITE_OTHER): Payer: 59 | Admitting: Family Medicine

## 2021-12-03 ENCOUNTER — Other Ambulatory Visit (HOSPITAL_COMMUNITY): Payer: Self-pay | Admitting: Family Medicine

## 2021-12-03 ENCOUNTER — Other Ambulatory Visit: Payer: Self-pay

## 2021-12-03 ENCOUNTER — Encounter: Payer: Self-pay | Admitting: Family Medicine

## 2021-12-03 VITALS — BP 140/100 | HR 101 | Resp 16 | Ht 67.0 in | Wt 325.0 lb

## 2021-12-03 DIAGNOSIS — F322 Major depressive disorder, single episode, severe without psychotic features: Secondary | ICD-10-CM

## 2021-12-03 DIAGNOSIS — E559 Vitamin D deficiency, unspecified: Secondary | ICD-10-CM

## 2021-12-03 DIAGNOSIS — M170 Bilateral primary osteoarthritis of knee: Secondary | ICD-10-CM | POA: Insufficient documentation

## 2021-12-03 DIAGNOSIS — E118 Type 2 diabetes mellitus with unspecified complications: Secondary | ICD-10-CM

## 2021-12-03 DIAGNOSIS — Z0000 Encounter for general adult medical examination without abnormal findings: Secondary | ICD-10-CM | POA: Diagnosis not present

## 2021-12-03 DIAGNOSIS — R921 Mammographic calcification found on diagnostic imaging of breast: Secondary | ICD-10-CM

## 2021-12-03 DIAGNOSIS — Z599 Problem related to housing and economic circumstances, unspecified: Secondary | ICD-10-CM

## 2021-12-03 DIAGNOSIS — I10 Essential (primary) hypertension: Secondary | ICD-10-CM

## 2021-12-03 DIAGNOSIS — R7303 Prediabetes: Secondary | ICD-10-CM | POA: Diagnosis not present

## 2021-12-03 DIAGNOSIS — M1712 Unilateral primary osteoarthritis, left knee: Secondary | ICD-10-CM | POA: Insufficient documentation

## 2021-12-03 MED ORDER — AMLODIPINE BESYLATE 5 MG PO TABS: 5.0000 mg | ORAL_TABLET | Freq: Every day | ORAL | 3 refills | Status: DC

## 2021-12-03 NOTE — Patient Instructions (Addendum)
F/u in 8 to 10 weeks, re evaluate blood pressure, call if you need me sooner  New additional medication for bP, amlodipine 5 mg daily, continue all other medications you are taking Please schedule diagnostic left mammogram first week in June  Lab today CBC, TSH, HBA1C, chem 7 and EGFR, CBC and vit D  You are referred to Ortho re knee pain  You are referred to therapist  I will directly reach out to your case worker , re your current situation

## 2021-12-04 ENCOUNTER — Ambulatory Visit (INDEPENDENT_AMBULATORY_CARE_PROVIDER_SITE_OTHER): Payer: 59 | Admitting: Obstetrics & Gynecology

## 2021-12-04 ENCOUNTER — Other Ambulatory Visit: Payer: Self-pay | Admitting: Family Medicine

## 2021-12-04 ENCOUNTER — Encounter: Payer: Self-pay | Admitting: Obstetrics & Gynecology

## 2021-12-04 DIAGNOSIS — N920 Excessive and frequent menstruation with regular cycle: Secondary | ICD-10-CM | POA: Diagnosis not present

## 2021-12-04 DIAGNOSIS — D250 Submucous leiomyoma of uterus: Secondary | ICD-10-CM | POA: Diagnosis not present

## 2021-12-04 DIAGNOSIS — N946 Dysmenorrhea, unspecified: Secondary | ICD-10-CM

## 2021-12-04 LAB — BMP8+EGFR
BUN/Creatinine Ratio: 12 (ref 9–23)
BUN: 14 mg/dL (ref 6–24)
CO2: 19 mmol/L — ABNORMAL LOW (ref 20–29)
Calcium: 10.3 mg/dL — ABNORMAL HIGH (ref 8.7–10.2)
Chloride: 100 mmol/L (ref 96–106)
Creatinine, Ser: 1.2 mg/dL — ABNORMAL HIGH (ref 0.57–1.00)
Glucose: 96 mg/dL (ref 70–99)
Potassium: 3.7 mmol/L (ref 3.5–5.2)
Sodium: 139 mmol/L (ref 134–144)
eGFR: 56 mL/min/{1.73_m2} — ABNORMAL LOW (ref >59)

## 2021-12-04 LAB — VITAMIN D 25 HYDROXY (VIT D DEFICIENCY, FRACTURES): Vit D, 25-Hydroxy: 91.5 ng/mL (ref 30.0–100.0)

## 2021-12-04 LAB — CBC
Hematocrit: 39.4 % (ref 34.0–46.6)
Hemoglobin: 13.4 g/dL (ref 11.1–15.9)
MCH: 30.3 pg (ref 26.6–33.0)
MCHC: 34 g/dL (ref 31.5–35.7)
MCV: 89 fL (ref 79–97)
Platelets: 385 10*3/uL (ref 150–450)
RBC: 4.42 x10E6/uL (ref 3.77–5.28)
RDW: 13.2 % (ref 11.7–15.4)
WBC: 8.1 10*3/uL (ref 3.4–10.8)

## 2021-12-04 LAB — HEMOGLOBIN A1C
Est. average glucose Bld gHb Est-mCnc: 131 mg/dL
Hgb A1c MFr Bld: 6.2 % — ABNORMAL HIGH (ref 4.8–5.6)

## 2021-12-04 LAB — TSH: TSH: 2.74 u[IU]/mL (ref 0.450–4.500)

## 2021-12-04 MED ORDER — MYFEMBREE 40-1-0.5 MG PO TABS: 1.0000 | ORAL_TABLET | Freq: Every day | ORAL | 11 refills | Status: DC

## 2021-12-04 MED ORDER — MEGESTROL ACETATE 40 MG PO TABS: ORAL_TABLET | ORAL | 1 refills | Status: DC

## 2021-12-04 NOTE — Progress Notes (Signed)
Follow up appointment for response:  Chief Complaint  Patient presents with   Follow-up    Last menstrual period 11/30/2021.      [facility] [address] [place] [address]                                                                                                                        GYNECOLOGIC SONOGRAM     Jennifer [name] is a 46 y.o. G1P1001 LMP 07/06/2021 She is here for a pelvic sonogram for abnormal uterine bleeding.   Uterus                      7.1 x 5.7 x 6.3 cm, Total uterine volume 132 cc, heterogeneous anteverted uterus with mult fibroids,(#1) posterior right fundal submucosal fibroid 2.5 x 2.2 x 2.3 cm,(#2) posterior left submucosal fibroid 2.5 x 2.3 x 2.6 cm,(#3) posterior calcified intramural fibroid 13 x 9 x 8 mm   Endometrium          6.2 mm, symmetrical, distorted by submucosal fibroids   Right ovary             2.9 x 1.6 x 2.9 cm, wnl   Left ovary                2.8 x 3.1 x 2.6 cm, homogeneous left ovarian mass with posterior shadowing (? Dermoid) 2.3 x 2.1 x 1.1 cm   No free fluid    Technician Comments:   PELVIC US TA/TV: heterogeneous anteverted uterus with mult fibroids,(#1) posterior right fundal submucosal fibroid 2.5 x 2.2 x 2.3 cm,(#2) posterior left submucosal fibroid 2.5 x 2.3 x 2.6 cm,(#3) posterior calcified intramural fibroid 13 x 9 x 8 mm,EEC 6.2 mm,normal right ovary,homogeneous left ovarian mass with posterior shadowing (? Dermoid) 2.3 x 2.1 x 1.1 cm, ovaries appear mobile,no free fluid,no pain during ultrasound   Chaperone [address] Jennifer [name] 07/14/2021 3:54 PM   Clinical Impression and recommendations:   I have reviewed the sonogram results above, combined with the patient's current clinical course, below are my impressions and any appropriate recommendations for management based on the sonographic findings.   Uterus is overall normal size but with several small fibroids 1 of which is  submucosal Endometrium normal Ovaries are normal with a left benign cystic teratoma     Jennifer [name] 07/14/2021 4:08 PM  MEDS ordered this encounter: Meds ordered this encounter  Medications   Relugolix-Estradiol-Norethind (MYFEMBREE) 40-1-0.5 MG TABS    Sig: Take 1 tablet by mouth daily.    Dispense:  30 tablet    Refill:  11   megestrol (MEGACE) 40 MG tablet    Sig: 2 tablets daily    Dispense:  60 tablet    Refill:  1    Orders for this encounter: No orders of the defined types were placed in this encounter.   Impression:  ICD-10-CM   1. Menorrhagia with regular cycle  N92.0     2. Dysmenorrhea  N94.6     3. Fibroids, submucosal  D25.0        Plan: Trial of myfembree, then consider endometrial ablation(has small submucosal myoma) depending on response  Follow Up: Return in about 3 months (around 03/04/2022) for Follow up, with Dr Elonda Husky.      All questions were answered.  Past Medical History:  Diagnosis Date   Abnormal vaginal Pap smear    Acid reflux    Alopecia    Anxiety    Arthritis    Depression    Diabetes mellitus without complication (HCC)    prediabetic, takes no meds   Diverticulosis    Family history of diabetes mellitus    GERD (gastroesophageal reflux disease)    Hemorrhoids    History of recurrent UTIs    Hypertension    Obesity     Past Surgical History:  Procedure Laterality Date   APPENDECTOMY     COLONOSCOPY N/A 03/13/2014   Dr. Gala Romney- grade 3 hemorrhoids, colonic diverticulosis bx= benign lymphoid polyp   COLONOSCOPY WITH PROPOFOL N/A 01/26/2021   Procedure: COLONOSCOPY WITH PROPOFOL;  Surgeon: Daneil Dolin, MD;  Location: [facility];  Service: Endoscopy;  Laterality: N/A;  AM   KNEE ARTHROSCOPY     right   POLYPECTOMY  01/26/2021   Procedure: POLYPECTOMY;  Surgeon: Daneil Dolin, MD;  Location: [facility];  Service: Endoscopy;;    OB History     Gravida  1   Para  1   Term  1   Preterm      AB       Living  1      SAB      IAB      Ectopic      Multiple      Live Births  1           No Known Allergies  Social History   Socioeconomic History   Marital status: Single    Spouse name: Not on file   Number of children: 4   Years of education: Not on file   Highest education level: Some college, no degree  Occupational History   Occupation: UNEMPLOYWED SINCE 10/2010  Tobacco Use   Smoking status: Never   Smokeless tobacco: Never  Vaping Use   Vaping Use: Never used  Substance and Sexual Activity   Alcohol use: No   Drug use: No   Sexual activity: Not Currently    Birth control/protection: Pill  Other Topics Concern   Not on file  Social History Narrative   Not on file   Social Determinants of Health   Financial Resource Strain: High Risk   Difficulty of Paying Living Expenses: Hard  Food Insecurity: No Food Insecurity   Worried About Running Out of Food in the Last Year: Never true   Ran Out of Food in the Last Year: Never true  Transportation Needs: No Transportation Needs   Lack of Transportation (Medical): No   Lack of Transportation (Non-Medical): No  Physical Activity: Inactive   Days of Exercise per Week: 0 days   Minutes of Exercise per Session: 0 min  Stress: No Stress Concern Present   Feeling of Stress : Only a little  Social Connections: Moderately Isolated   Frequency of Communication with Friends and Family: More than three times a week   Frequency of Social Gatherings with Friends  and Family: Three times a week   Attends Religious Services: 1 to 4 times per year   Active Member of Clubs or Organizations: No   Attends Music therapist: Never   Marital Status: Never married    Family History  Adopted: Yes  Problem Relation Age of Onset   Hypertension Mother    Diabetes Mother    Other Mother        cholangiocarcinoma, age 3, deceased   Breast cancer Maternal Aunt    Colon cancer Neg Hx    Tuberous sclerosis Neg  Hx    Alpha-1 antitrypsin deficiency Neg Hx

## 2021-12-06 ENCOUNTER — Encounter: Payer: Self-pay | Admitting: Family Medicine

## 2021-12-06 NOTE — Assessment & Plan Note (Signed)
Severe and debilitating, refer ortho

## 2021-12-06 NOTE — Assessment & Plan Note (Signed)
°  Patient re-educated about  the importance of commitment to a  minimum of 150 minutes of exercise per week as able.  The importance of healthy food choices with portion control discussed, as well as eating regularly and within a 12 hour window most days. The need to choose "clean , green" food 50 to 75% of the time is discussed, as well as to make water the primary drink and set a goal of 64 ounces water daily.    Weight /BMI 12/03/2021 11/24/2021 09/09/2021  WEIGHT 325 lb 326 lb 335 lb 0.6 oz  HEIGHT 5\' 7"  5\' 7"  5\' 7"   BMI 50.9 kg/m2 51.06 kg/m2 52.47 kg/m2

## 2021-12-06 NOTE — Progress Notes (Signed)
Jennifer [name]     MRN: [id number]      DOB: 1976/01/25  HPI: Patient is in for annual physical exam. Uncontrolled hypertension is addressed C/o increasingly disabling knee pain and instability, neds Ortho eval S/o increased financial stress, as effective 11/15/2021 NO income. Desperately wants to work but is physically unable, has no long term disability, and short term disability has run out Overwhelming and depressing, has family support , motivated because of her only child to strive for better days Losing weight and finds nutrition counseling very useful Unable to afford co pay for [facility] [facility], no income Immunization is reviewed , and  updated if needed.   PE: BP (!) 140/100    Pulse (!) 101    Resp 16    Ht 5\' 7"  (1.702 m)    Wt (!) 325 lb (147.4 kg)    LMP 11/30/2021 (Approximate)    SpO2 95%    BMI 50.90 kg/m   Pleasant  female, alert and oriented x 3, in no cardio-pulmonary distress. Afebrile. HEENT No facial trauma or asymetry. Sinuses non tender.  Extra occullar muscles intact.. External ears normal, . Neck: supple, no adenopathy,JVD or thyromegaly.No bruits.  Chest: Clear to ascultation bilaterally.No crackles or wheezes. Non tender to palpation  Breast: No asymetry,no masses or lumps. No tenderness. No nipple discharge or inversion. No axillary or supraclavicular adenopathy  Cardiovascular system; Heart sounds normal,  S1 and  S2 ,no S3.  No murmur, or thrill. Apical beat not displaced Peripheral pulses normal.  Abdomen: Soft, non tender,   Musculoskeletal exam: Decreased  ROM of spine,  and markedly reduced in knees.  deformity ,swelling and  crepitus noted.in knees No muscle wasting or atrophy.   Neurologic: Cranial nerves 2 to 12 intact. Power, tone ,sensation  normal throughout. disturbance in gait. No tremor.  Skin: Intact, no ulceration, erythema , scaling or rash noted. Pigmentation normal throughout  Psych; Depressed  mood   Assessment &  Plan:  Annual physical exam Annual exam as documented.  Immunization and cancer screening needs are specifically addressed at this visit.   Essential hypertension Uncontrolled , add amlodipine and re eval DASH diet and commitment to daily physical activity for a minimum of 30 minutes discussed and encouraged, as a part of hypertension management. The importance of attaining a healthy weight is also discussed.  BP/Weight 12/03/2021 11/24/2021 09/09/2021 09/02/2021 09/01/2021 07/14/2021 07/07/2021  Systolic BP 140 - 125 - 120 132 -  Diastolic BP 100 - 87 - 80 92 -  Wt. (Lbs) 325 326 335.04 335 334 337 337  BMI 50.9 51.06 52.47 52.47 52.31 52.78 52.78       Financial difficulties Worsened since 11/2021, out of short term disability and no long term disability Wants to , however physically incapable of work,aby sits intermittently but not able, tried to drive with uber, her car is unreliable hence unable Will reach out specifically to social work and [facility] tean to see where there are any opportunities to provide help for the patient Family is supportive, addresses needs of her daughter  Depression, major, single episode, severe (HCC) Refer therapy  Osteoarthritis of both knees Severe and debilitating, refer ortho  Morbid obesity (HCC)  Patient re-educated about  the importance of commitment to a  minimum of 150 minutes of exercise per week as able.  The importance of healthy food choices with portion control discussed, as well as eating regularly and within a 12 hour window most days. The need to  choose "clean , green" food 50 to 75% of the time is discussed, as well as to make water the primary drink and set a goal of 64 ounces water daily.    Weight /BMI 12/03/2021 11/24/2021 09/09/2021  WEIGHT 325 lb 326 lb 335 lb 0.6 oz  HEIGHT 5\' 7"  5\' 7"  5\' 7"   BMI 50.9 kg/m2 51.06 kg/m2 52.47 kg/m2

## 2021-12-06 NOTE — Assessment & Plan Note (Signed)
Uncontrolled , add amlodipine and re eval DASH diet and commitment to daily physical activity for a minimum of 30 minutes discussed and encouraged, as a part of hypertension management. The importance of attaining a healthy weight is also discussed.  BP/Weight 12/03/2021 11/24/2021 09/09/2021 09/02/2021 09/01/2021 07/14/2021 05/29/9538  Systolic BP 672 - 897 - 915 041 -  Diastolic BP 364 - 87 - 80 92 -  Wt. (Lbs) 325 326 335.04 335 334 337 337  BMI 50.9 51.06 52.47 52.47 52.31 52.78 52.78

## 2021-12-06 NOTE — Assessment & Plan Note (Signed)
Refer therapy

## 2021-12-06 NOTE — Assessment & Plan Note (Signed)
Annual exam as documented. . Immunization and cancer screening needs are specifically addressed at this visit.  

## 2021-12-06 NOTE — Assessment & Plan Note (Signed)
Worsened since 11/2021, out of short term disability and no long term disability Wants to , however physically incapable of work,aby sits intermittently but not able, tried to drive with uber, her car is unreliable hence unable Will reach out specifically to social work and [place] to see where there are any opportunities to provide help for the patient Family is supportive, addresses needs of her daughter

## 2021-12-07 ENCOUNTER — Telehealth: Payer: Self-pay | Admitting: *Deleted

## 2021-12-07 NOTE — Chronic Care Management (AMB) (Signed)
Care Management   Note  12/07/2021 Name: Jennifer [name] MRN: [id number] DOB: [birth year]  Jennifer [name] is a 46 y.o. year old female who is a primary care patient of Lodema Hong Milus Mallick, MD. I reached out to Jennifer [name] by phone today in response to a referral sent by Jennifer [name]'s primary care provider.   Jennifer [name] was given information about care management services today including:  Care management services include personalized support from designated clinical staff supervised by her physician, including individualized plan of care and coordination with other care providers 24/7 contact phone numbers for assistance for urgent and routine care needs. The patient may stop care management services at any time by phone call to the office staff.  Patient agreed to services and verbal consent obtained.   Follow up plan: Telephone appointment with care management team member scheduled for:12/11/21  [facility] Guide, Embedded Care Coordination [facility]   Care Management  Direct Dial: [phone]

## 2021-12-08 ENCOUNTER — Ambulatory Visit: Payer: 59 | Admitting: Gastroenterology

## 2021-12-08 ENCOUNTER — Other Ambulatory Visit: Payer: Self-pay

## 2021-12-08 ENCOUNTER — Encounter: Payer: Self-pay | Admitting: Gastroenterology

## 2021-12-08 VITALS — BP 150/91 | HR 112 | Temp 96.9°F | Ht 67.0 in | Wt 323.0 lb

## 2021-12-08 DIAGNOSIS — N321 Vesicointestinal fistula: Secondary | ICD-10-CM | POA: Insufficient documentation

## 2021-12-08 DIAGNOSIS — R198 Other specified symptoms and signs involving the digestive system and abdomen: Secondary | ICD-10-CM

## 2021-12-08 DIAGNOSIS — K5790 Diverticulosis of intestine, part unspecified, without perforation or abscess without bleeding: Secondary | ICD-10-CM | POA: Diagnosis not present

## 2021-12-08 NOTE — Progress Notes (Signed)
Primary Care Physician: [name], MD  Primary Gastroenterologist:  Garfield Cornea, MD   Chief Complaint  Patient presents with   Constipation    When constipated feels like a knot in mid lower abd    HPI: Jennifer [name] is a 46 y.o. female here for follow-up.  Patient last seen in February 2022.  She has a history of abdominal pain, diarrhea alternating with constipation.  Patient completed colonoscopy since her last office visit.  Colonoscopy 01/2021: - Hemorrhoids found on perianal exam. - Perianal skin tags found on perianal exam. - Diverticulosis in the sigmoid colon, in the descending colon, in the transverse colon and in the distal transverse colon. - One 3 mm polyp in the cecum, removed with a cold snare. Resected and retrieved.  Tubular adenoma. - The examination was otherwise normal on direct and retroflexion views. -Colonoscopy in 7 years  Since her last office visit she had a CT abdomen pelvis without contrast August 2022 ordered by her urologist for hematuria.  Gas noted in the urinary bladder with tenting of the left superior urinary bladder with adjacent stellate soft tissue extending to the sigmoid colon.  Colonic diverticulosis with mild wall thickening and adjacent stranding involving the sigmoid colon.  Inflammation, wall thickening extending to the stellate areas soft tissue which extends to the urinary bladder.  Findings concerning for colovesical fistula superimposed acute component of mild sigmoid diverticulitis.  Notably in November 2016 she had CT with contrast showing inflammatory process in the left mid to lower abdomen likely due to acute diverticulitis of the sigmoid colon with perforation.  She had resulting peritonitis with secondary inflammation of several adjacent small bowel loops and focal ileus.  Possible developing diverticular abscess.  Treated conservatively with antibiotics.  She had cystoscopy June 29, 2021 that showed a subtle  smooth divot that could be a healed fistula or a fistula trying to form noted in the left superior posterior bladder.  Refer back to GI.  CT findings discussed with Dr. Gala Romney in 06/2021. He recommended antibiotics for diverticulitis.  If ongoing symptoms/concern for fistula, consider MR to further evaluate.  Today: She reports frequent UTIs last year.  Has not seen urology since August.  Overall UTIs have improved.  She is on Ditropan daily.  Has been dealing with heavy menses, followed closely by GYN.  History of uterine fibroids.  Patient unable to tell whether or not her urine has debris/stool because of frequent vaginal bleeding.  She continues to have nocturia at least twice per night.  She has lower abdominal fullness and discomfort, worse with constipation.  Continues to alternate between constipation and diarrhea lately seems to be more constipation predominant.  Typically controls with diet as opposed to medication because of her tendency to go back towards diarrhea.  Has not noted any melena or rectal bleeding.  Only occasionally uses dicyclomine for abdominal spasms.  No significant reflux symptoms.  Appointment pending for possible bariatric surgery.  Has chronic knee pain, needing surgery but has to lose some weight.   Current Outpatient Medications  Medication Sig Dispense Refill   acetaminophen (TYLENOL) 500 MG tablet Take 1,000 mg by mouth every 6 (six) hours as needed for mild pain or moderate pain.     amLODipine (NORVASC) 5 MG tablet Take 1 tablet (5 mg total) by mouth daily. 30 tablet 3   busPIRone (BUSPAR) 15 MG tablet TAKE 1 TABLET BY MOUTH THREE TIMES A DAY 270 tablet 1  clotrimazole-betamethasone (LOTRISONE) cream APPLY TO AFFECTED AREA TWICE A DAY 45 g 1   dicyclomine (BENTYL) 10 MG capsule TAKE 1 CAPSULE (10 MG TOTAL) BY MOUTH 2 (TWO) TIMES DAILY AS NEEDED FOR SPASMS. 60 capsule 2   fluticasone (FLONASE) 50 MCG/ACT nasal spray Place 2 sprays into both nostrils daily. (Patient  taking differently: Place 2 sprays into both nostrils daily as needed for allergies.) 16 g 6   hydrOXYzine (ATARAX/VISTARIL) 50 MG tablet TAKE ONE TABLET AT BEDTIME FOR SLEEP (Patient taking differently: Take 50 mg by mouth at bedtime as needed (sleep).) 90 tablet 0   INCASSIA 0.35 MG tablet TAKE 1 TABLET BY MOUTH EVERY DAY 84 tablet 1   megestrol (MEGACE) 40 MG tablet 2 tablets daily 60 tablet 1   meloxicam (MOBIC) 15 MG tablet Take 15 mg by mouth daily.     oxybutynin (DITROPAN-XL) 10 MG 24 hr tablet Take 1 tablet (10 mg total) by mouth daily. 30 tablet 11   Relugolix-Estradiol-Norethind (MYFEMBREE) 40-1-0.5 MG TABS Take 1 tablet by mouth daily. 30 tablet 11   tirzepatide (MOUNJARO) 2.5 MG/0.5ML Pen Inject 2.5 mg into the skin once a week. 2 mL 1   triamterene-hydrochlorothiazide (MAXZIDE) 75-50 MG tablet Take 1 tablet by mouth daily. 90 tablet 3   venlafaxine XR (EFFEXOR-XR) 150 MG 24 hr capsule TAKE 1 CAPSULE BY MOUTH DAILY WITH BREAKFAST. 90 capsule 3   No current facility-administered medications for this visit.    Allergies as of 12/08/2021   (No Known Allergies)    ROS:  General: Negative for anorexia,unintentional weight loss, fever, chills,+ fatigue, weakness. Intentional weight loss of 30 pounds in the past 18 months. ENT: Negative for hoarseness, difficulty swallowing , nasal congestion. CV: Negative for chest pain, angina, palpitations, dyspnea on exertion, peripheral edema.  Respiratory: Negative for dyspnea at rest, dyspnea on exertion, cough, sputum, wheezing.  GI: See history of present illness. GU:  Negative for dysuria, hematuria, urinary incontinence, urinary frequency, nocturnal urination.  Endo: Negative for unusual weight change.    Physical Examination:   BP (!) 150/91    Pulse (!) 112    Temp (!) 96.9 F (36.1 C) (Temporal)    Ht 5\' 7"  (1.702 m)    Wt (!) 323 lb (146.5 kg)    LMP 11/30/2021 (Approximate)    BMI 50.59 kg/m   General: Well-nourished,  well-developed in no acute distress.  Eyes: No icterus. Mouth: Oropharyngeal mucosa moist and pink , no lesions erythema or exudate. Lungs: Clear to auscultation bilaterally.  Heart: Regular rate and rhythm, no murmurs rubs or gallops.  Abdomen: Bowel sounds are normal, nontender, nondistended, no hepatosplenomegaly or masses, no abdominal bruits or hernia , no rebound or guarding.   Extremities: No lower extremity edema. No clubbing or deformities. Neuro: Alert and oriented x 4   Skin: Warm and dry, no jaundice.   Psych: Alert and cooperative, normal mood and affect.  Labs:  Lab Results  Component Value Date   CREATININE 1.20 (H) 12/03/2021   BUN 14 12/03/2021   NA 139 12/03/2021   K 3.7 12/03/2021   CL 100 12/03/2021   CO2 19 (L) 12/03/2021   Lab Results  Component Value Date   ALT 21 05/06/2021   AST 17 05/06/2021   ALKPHOS 164 (H) 05/06/2021   BILITOT <0.2 05/06/2021   Lab Results  Component Value Date   WBC 8.1 12/03/2021   HGB 13.4 12/03/2021   HCT 39.4 12/03/2021   MCV 89 12/03/2021  PLT 385 12/03/2021   Lab Results  Component Value Date   HGBA1C 6.2 (H) 12/03/2021   Lab Results  Component Value Date   TSH 2.740 12/03/2021     Imaging Studies: No results found.   Assessment:  Alternating diarrhea/constipation with lower abdominal pain: symptoms chronically. Occasionally uses bentyl with relief. Currently tendency towards constipation but very sensitive to medication. She can control with eating fiber or drinking coffee. Still having BMs most days. Notes low abdominal pain, exacerbating by constipation.   Question of colovesical fistula: noted on CT 06/2021 (non-contrast) as part of work up by urology for microhematuria, urgency. Cystoscopy with subtle smooth divot that could be a healed fistula or a fistula trying to form. Continues to have urinary urgency, and lower abdominal discomfort (although could be multifactorial in setting of constipation/uterine  fibroids/urinary urgency). Less frequent UTIs. She is not sure if she is passing stool or debris in her urine due to ongoing vaginal bleeding. ?passing air?Marland Kitchen She is requesting additional imaging which is reasonable.    Plan: She will add Colace 200 mg or miralax 17 grams at bedtime on days she does not have adequate BM. Will determine appropriate imaging for concern for colovesical fistula.

## 2021-12-08 NOTE — Patient Instructions (Signed)
For constipation: if you have a day that you don't have a good bowel movement, take a stool softener such as docusate sodium 200mg  at bedtime OR miralax 17 grams at bedtime.  I will discuss with radiology and Dr. Gala Romney regarding imaging to rule out fistula. We will be in touch.

## 2021-12-09 ENCOUNTER — Other Ambulatory Visit: Payer: Self-pay | Admitting: Family Medicine

## 2021-12-09 ENCOUNTER — Ambulatory Visit (INDEPENDENT_AMBULATORY_CARE_PROVIDER_SITE_OTHER): Payer: 59

## 2021-12-09 ENCOUNTER — Ambulatory Visit: Payer: Self-pay

## 2021-12-09 ENCOUNTER — Encounter: Payer: Self-pay | Admitting: Orthopaedic Surgery

## 2021-12-09 ENCOUNTER — Ambulatory Visit (INDEPENDENT_AMBULATORY_CARE_PROVIDER_SITE_OTHER): Payer: 59 | Admitting: Orthopaedic Surgery

## 2021-12-09 DIAGNOSIS — G8929 Other chronic pain: Secondary | ICD-10-CM | POA: Diagnosis not present

## 2021-12-09 DIAGNOSIS — Z6841 Body Mass Index (BMI) 40.0 and over, adult: Secondary | ICD-10-CM | POA: Diagnosis not present

## 2021-12-09 DIAGNOSIS — M25562 Pain in left knee: Secondary | ICD-10-CM | POA: Diagnosis not present

## 2021-12-09 DIAGNOSIS — M25561 Pain in right knee: Secondary | ICD-10-CM

## 2021-12-09 DIAGNOSIS — E559 Vitamin D deficiency, unspecified: Secondary | ICD-10-CM

## 2021-12-09 MED ORDER — ACETAMINOPHEN-CODEINE #3 300-30 MG PO TABS: 1.0000 | ORAL_TABLET | Freq: Three times a day (TID) | ORAL | 2 refills | Status: DC | PRN

## 2021-12-09 NOTE — Progress Notes (Signed)
Office Visit Note   Patient: Jennifer [name]           Date of Birth: 1976-10-02           MRN: [id number] Visit Date: 12/09/2021              Requested by: [name], MD [address] [place],  [place] [address] PCP: [name], MD   Assessment & Plan: Visit Diagnoses:  1. Chronic pain of left knee   2. Chronic pain of right knee   3. Body mass index 50.0-59.9, adult (HCC)   4. Morbid obesity (HCC)     Plan: Impression is advanced tricompartmental degenerative changes both knees with windswept appearance.  The patient has currently failed conservative treatment options but as at a BMI of almost 51 which does not allow her to proceed with definitive treatment of total knee arthroplasty.  She is currently seeing a dietitian and has been referred to a bariatric clinic for which she sees next month.  We have discussed that she needs to get to a weight of 250 pounds in order to get to a BMI of under 40 to proceed with surgery.  She understands and agrees.  I have agreed to call in Tylenol 3 in the meantime.  Call with concerns or questions.  The patient meets the AMA guidelines for Morbid (severe) obesity with a BMI > 40.0 and I have recommended weight loss.  Follow-Up Instructions: Return if symptoms worsen or fail to improve.   Orders:  Orders Placed This Encounter  Procedures   XR KNEE 3 VIEW LEFT   XR KNEE 3 VIEW RIGHT   Meds ordered this encounter  Medications   acetaminophen-codeine (TYLENOL #3) 300-30 MG tablet    Sig: Take 1 tablet by mouth every 8 (eight) hours as needed for moderate pain.    Dispense:  30 tablet    Refill:  2      Procedures: No procedures performed   Clinical Data: No additional findings.   Subjective: Chief Complaint  Patient presents with   Right Knee - Pain   Left Knee - Pain    HPI patient is a pleasant 46 year old female who comes in today with bilateral knee pain left greater than right.  She has had  pain for over a year which is located to the entire aspect of both knees.  Pain is worse with walking as well as going from a seated to standing position.  She does elevate her legs at night which does seem to help.  She has been taking meloxicam without relief.  She has had previous cortisone injections at [facility] which have provided little relief.  She is currently seeing a dietitian and has recently been referred to bariatric clinic for which she sees next month.  Review of Systems as detailed in HPI.  All others reviewed and are negative.   Objective: Vital Signs: LMP 11/30/2021 (Approximate)   Physical Exam well-developed well-nourished female no acute distress.  Alert and oriented x3.  Ortho Exam left knee exam shows varus deformity.  Range of motion 0 to 90 degrees.  Medial joint line tenderness.  Moderate patellofemoral crepitus.  Right knee exam shows valgus deformity.  Medial joint line tenderness.  Moderate patellofemoral crepitus.  She is neurovascular tact distally.  Specialty Comments:  No specialty comments available.  Imaging: No results found.   PMFS History: Patient Active Problem List   Diagnosis Date Noted  Colovesical fistula 12/08/2021    Class: Question of   Osteoarthritis of both knees 12/03/2021   Controlled diabetes mellitus type 2 with complications (HCC) 09/09/2021   Generalized arthritis 09/09/2021   Snoring 07/06/2021   Financial difficulties 05/09/2021   Reduced vision 04/03/2021   Alternating constipation and diarrhea 12/16/2020   Hematochezia 09/09/2020   Right wrist pain 07/21/2020   Unsteady gait 07/14/2020   Poor urinary stream 05/27/2020   Depression, major, single episode, severe (HCC) 06/18/2019   Environmental allergies 06/18/2019   Candidiasis of skin 01/22/2019   Back pain 11/28/2017   Essential hypertension 03/22/2016   Diverticulosis 11/23/2015   Hemorrhoids 03/26/2015   Insomnia due to other mental disorder 07/10/2014   GAD  (generalized anxiety disorder) 07/10/2014   FHx: cancer of digestive organ 02/16/2014   Piles (hemorrhoids) 02/14/2014   Abnormal finding on Pap smear, HPV DNA positive 11/29/2013   Allergic rhinitis 02/12/2013   Annual physical exam 06/24/2012   Prediabetes 03/26/2012   Dyspepsia 03/22/2012   TUBERCULOSIS 11/02/2010   Alopecia (capitis) totalis 11/02/2010   Chronic fatigue 07/23/2009   Morbid obesity (HCC) 11/24/2007   Depression with anxiety 11/24/2007   Past Medical History:  Diagnosis Date   Abnormal vaginal Pap smear    Acid reflux    Alopecia    Anxiety    Arthritis    Depression    Diabetes mellitus without complication (HCC)    prediabetic, takes no meds   Diverticulosis    Family history of diabetes mellitus    GERD (gastroesophageal reflux disease)    Hemorrhoids    History of recurrent UTIs    Hypertension    Obesity     Family History  Adopted: Yes  Problem Relation Age of Onset   Hypertension Mother    Diabetes Mother    Other Mother        cholangiocarcinoma, age 73, deceased   Breast cancer Maternal Aunt    Colon cancer Neg Hx    Tuberous sclerosis Neg Hx    Alpha-1 antitrypsin deficiency Neg Hx     Past Surgical History:  Procedure Laterality Date   APPENDECTOMY     COLONOSCOPY N/A 03/13/2014   Dr. Jena Gauss- grade 3 hemorrhoids, colonic diverticulosis bx= benign lymphoid polyp   COLONOSCOPY WITH PROPOFOL N/A 01/26/2021   Procedure: COLONOSCOPY WITH PROPOFOL;  Surgeon: Corbin Ade, MD;  Location: [facility];  Service: Endoscopy;  Laterality: N/A;  AM   KNEE ARTHROSCOPY     right   POLYPECTOMY  01/26/2021   Procedure: POLYPECTOMY;  Surgeon: Corbin Ade, MD;  Location: [facility];  Service: Endoscopy;;   Social History   Occupational History   Occupation: UNEMPLOYWED SINCE 10/2010  Tobacco Use   Smoking status: Never   Smokeless tobacco: Never  Vaping Use   Vaping Use: Never used  Substance and Sexual Activity   Alcohol use: No    Drug use: No   Sexual activity: Not Currently    Birth control/protection: Pill

## 2021-12-10 ENCOUNTER — Encounter: Payer: Self-pay | Admitting: Family Medicine

## 2021-12-11 ENCOUNTER — Ambulatory Visit: Payer: 59 | Admitting: Licensed Clinical Social Worker

## 2021-12-11 DIAGNOSIS — M170 Bilateral primary osteoarthritis of knee: Secondary | ICD-10-CM

## 2021-12-11 DIAGNOSIS — Z599 Problem related to housing and economic circumstances, unspecified: Secondary | ICD-10-CM

## 2021-12-11 DIAGNOSIS — I10 Essential (primary) hypertension: Secondary | ICD-10-CM

## 2021-12-11 DIAGNOSIS — F322 Major depressive disorder, single episode, severe without psychotic features: Secondary | ICD-10-CM

## 2021-12-11 DIAGNOSIS — E118 Type 2 diabetes mellitus with unspecified complications: Secondary | ICD-10-CM

## 2021-12-11 DIAGNOSIS — M1990 Unspecified osteoarthritis, unspecified site: Secondary | ICD-10-CM

## 2021-12-11 DIAGNOSIS — F411 Generalized anxiety disorder: Secondary | ICD-10-CM

## 2021-12-11 NOTE — Patient Instructions (Addendum)
Visit Information  Patient Goals:  Depressive Symptoms Identified. Manage Depression Symptoms. Manage anxiety symptoms  Time Frame:  Short Term Goal Priority:  High Progress:  Not On Track  Start Date:  12/11/21  Expected End Date: 03/04/22  Follow Up Date:  12/25/21 at 10:00 AM  Depressive Symptoms Identified.  Manage Depression Symptoms. Manage Anxiety symptoms   Patient Self Care Activities:  Attends medical appointments  Patient Coping Strengths:  Has emotional support from nephew, [place] Latulippe  Patient Self Care Deficits:  Financial challenges Sleep issues Depression issues Anxiety issues  Patient Goals:  - spend time or talk with others at least 2 to 3 times per week - practice relaxation or meditation daily - keep a calendar with appointment dates  Follow Up Plan: LCSW to call client on 12/25/21 at 10:00 AM to assess her needs   Norva Riffle.Mayci Haning MSW, [place] [employer] representative [facility] Care Management [phone]

## 2021-12-11 NOTE — Chronic Care Management (AMB) (Signed)
Chronic Care Management    Clinical Social Work Note  12/11/2021 Name: Jennifer [name] MRN: [id number] DOB: 12/08/1975  Jennifer [name] is a 46 y.o. year old female who is a primary care patient of Lodema Hong, Milus Mallick, MD. The CCM team was consulted to assist the patient with chronic disease management and/or care coordination needs related to: [employer] .   Engaged with patient by telephone for initial visit in response to provider referral for social work chronic care management and care coordination services.   Consent to Services:  The patient was given the following information about Chronic Care Management services today, agreed to services, and gave verbal consent: 1. CCM service includes personalized support from designated clinical staff supervised by the primary care provider, including individualized plan of care and coordination with other care providers 2. 24/7 contact phone numbers for assistance for urgent and routine care needs. 3. Service will only be billed when office clinical staff spend 20 minutes or more in a month to coordinate care. 4. Only one practitioner may furnish and bill the service in a calendar month. 5.The patient may stop CCM services at any time (effective at the end of the month) by phone call to the office staff. 6. The patient will be responsible for cost sharing (co-pay) of up to 20% of the service fee (after annual deductible is met). Patient agreed to services and consent obtained.  Patient agreed to services and consent obtained.   Assessment: Review of patient past medical history, allergies, medications, and health status, including review of relevant consultants reports was performed today as part of a comprehensive evaluation and provision of chronic care management and care coordination services.     SDOH (Social Determinants of Health) assessments and interventions performed:  SDOH Interventions    Flowsheet Row Most Recent Value  SDOH  Interventions   Physical Activity Interventions Other (Comments)  [walking challenges. has pain in her knees.has pain in her back. uses cane to help her walk]  Stress Interventions Provide Counseling  [client has stress related to financial challenges. client has stress related to managing medical needs]  Depression Interventions/Treatment  Currently on Treatment, Counseling        Advanced Directives Status: See Vynca application for related entries.  CCM Care Plan  No Known Allergies  Outpatient Encounter Medications as of 12/11/2021  Medication Sig Note   acetaminophen (TYLENOL) 500 MG tablet Take 1,000 mg by mouth every 6 (six) hours as needed for mild pain or moderate pain.    acetaminophen-codeine (TYLENOL #3) 300-30 MG tablet Take 1 tablet by mouth every 8 (eight) hours as needed for moderate pain.    amLODipine (NORVASC) 5 MG tablet TAKE 1 TABLET (5 MG TOTAL) BY MOUTH DAILY.    busPIRone (BUSPAR) 15 MG tablet TAKE 1 TABLET BY MOUTH THREE TIMES A DAY    Cholecalciferol (VITAMIN D3) 1.25 MG (50000 UT) CAPS TAKE 1 CAPSULE BY MOUTH ONE TIME PER WEEK    clotrimazole-betamethasone (LOTRISONE) cream APPLY TO AFFECTED AREA TWICE A DAY    dicyclomine (BENTYL) 10 MG capsule TAKE 1 CAPSULE (10 MG TOTAL) BY MOUTH 2 (TWO) TIMES DAILY AS NEEDED FOR SPASMS.    fluticasone (FLONASE) 50 MCG/ACT nasal spray Place 2 sprays into both nostrils daily. (Patient taking differently: Place 2 sprays into both nostrils daily as needed for allergies.)    hydrOXYzine (ATARAX/VISTARIL) 50 MG tablet TAKE ONE TABLET AT BEDTIME FOR SLEEP (Patient taking differently: Take 50 mg by mouth at bedtime  as needed (sleep).)    INCASSIA 0.35 MG tablet TAKE 1 TABLET BY MOUTH EVERY DAY    megestrol (MEGACE) 40 MG tablet 2 tablets daily    meloxicam (MOBIC) 15 MG tablet Take 15 mg by mouth daily.    oxybutynin (DITROPAN-XL) 10 MG 24 hr tablet Take 1 tablet (10 mg total) by mouth daily. 09/09/2021: Pt not taking this med any  more she is taking something else    Relugolix-Estradiol-Norethind (MYFEMBREE) 40-1-0.5 MG TABS Take 1 tablet by mouth daily.    tirzepatide [facility]) 2.5 MG/0.5ML Pen Inject 2.5 mg into the skin once a week.    triamterene-hydrochlorothiazide (MAXZIDE) 75-50 MG tablet Take 1 tablet by mouth daily.    venlafaxine XR (EFFEXOR-XR) 150 MG 24 hr capsule TAKE 1 CAPSULE BY MOUTH DAILY WITH BREAKFAST.    No facility-administered encounter medications on file as of 12/11/2021.    Patient Active Problem List   Diagnosis Date Noted   Colovesical fistula 12/08/2021    Class: Question of   Osteoarthritis of both knees 12/03/2021   Controlled diabetes mellitus type 2 with complications (HCC) 09/09/2021   Generalized arthritis 09/09/2021   Snoring 07/06/2021   Financial difficulties 05/09/2021   Reduced vision 04/03/2021   Alternating constipation and diarrhea 12/16/2020   Hematochezia 09/09/2020   Right wrist pain 07/21/2020   Unsteady gait 07/14/2020   Poor urinary stream 05/27/2020   Depression, major, single episode, severe (HCC) 06/18/2019   Environmental allergies 06/18/2019   Candidiasis of skin 01/22/2019   Back pain 11/28/2017   Essential hypertension 03/22/2016   Diverticulosis 11/23/2015   Hemorrhoids 03/26/2015   Insomnia due to other mental disorder 07/10/2014   GAD (generalized anxiety disorder) 07/10/2014   FHx: cancer of digestive organ 02/16/2014   Piles (hemorrhoids) 02/14/2014   Abnormal finding on Pap smear, HPV DNA positive 11/29/2013   Allergic rhinitis 02/12/2013   Annual physical exam 06/24/2012   Prediabetes 03/26/2012   Dyspepsia 03/22/2012   TUBERCULOSIS 11/02/2010   Alopecia (capitis) totalis 11/02/2010   Chronic fatigue 07/23/2009   Morbid obesity (HCC) 11/24/2007   Depression with anxiety 11/24/2007    Conditions to be addressed/monitored: monitor client management of depression issues  Care Plan : LCSW Care Plan  Updates made by Jennifer Blakes,  LCSW since 12/11/2021 12:00 AM     Problem: Emotional Distress      Goal: Emotional Health Supported.  Manage Depression issues. Manage Anxiety issues. Manage financial needs   Start Date: 12/11/2021  Expected End Date: 03/03/2022  This Visit's Progress: Not on track  Priority: High  Note:   Current Barriers:  Financial barriers Depression issues Anxiety issues Suicidal Ideation/Homicidal Ideation: No  Clinical Social Work Goal(s):  patient will work with SW  in next 30 days by telephone or in person to reduce or manage symptoms related to depression or anxiety  Client to attend scheduled medical appointments in next 30 days Client to communicate with RNCM as needed for nursing support Client will talk with SW in next 30 days about financial needs of client  Interventions: 1:1 collaboration with Jennifer Perches, MD regarding development and update of comprehensive plan of care as evidenced by provider attestation and co-signature Discussed client needs with Jennifer [name]. Discussed food needs of client. She said she receives food stamps benefit monthly Discussed support from Jennifer [name].  Jennifer [name] said that agency helped her recently in paying her light bill Reviewed client support with Jennifer [name], Jennifer [name].  Reviewed social support  of client. She talks daily with her nephew , Jennifer [name]. Jennifer [name] does live in [name].  She has niece who can help her occasionally. Discussed financial challenges. She is concerned about not being able to pay her mortgage or car payment. She had been receiving funds from [employer] program through [employer]. She called program recently and they currently do not have available funds Discussed medication procurement of client. She has difficulty sometimes in paying for her medications.  Provided counseling support for client Reviewed CCM program support Reviewed pain issues. She spoke of knee pain and back pain issues Discussed dental  care needs Discussed her insurance:  [employer]. She said this is a health plan offered through the [employer] [place]. Discussed relaxation techniques. Likes to use internet to relax. She said it is hard to concentrate since she gets stressed out.  She said she is sleeping a good deal. She said she is sleeping more than she used to sleep Discussed local food pantries with client Discussed Long Term Disability. She applied and was denied. She appealed decision. She is working with attorney to continue to apply for Long Term Disability Discussed client going to [employer] for internet access and looking up information regarding Vocational Rehab.  Reviewed self esteem issues. She said she has low motivation and low self esteem. LCSW talked with client about counseling support.  LCSW congratulated Jennifer [name] on her progress with Jennifer [name], dietician.    Patient Self Care Activities:  Attends medical appointments  Patient Coping Strengths:  Has emotional support from nephew, Jennifer [name]  Patient Self Care Deficits:  Financial challenges Sleep issues Depression issues Anxiety issues  Patient Goals:  - spend time or talk with others at least 2 to 3 times per week - practice relaxation or meditation daily - keep a calendar with appointment dates  Follow Up Plan: LCSW to call client on 12/25/21 at 10:00 AM to assess her needs     Jennifer [name] MSW, LCSW Licensed Visual merchandiser [facility] Care Management [phone]

## 2021-12-25 ENCOUNTER — Ambulatory Visit: Payer: 59 | Admitting: Licensed Clinical Social Worker

## 2021-12-25 DIAGNOSIS — Z599 Problem related to housing and economic circumstances, unspecified: Secondary | ICD-10-CM

## 2021-12-25 DIAGNOSIS — F411 Generalized anxiety disorder: Secondary | ICD-10-CM

## 2021-12-25 DIAGNOSIS — M170 Bilateral primary osteoarthritis of knee: Secondary | ICD-10-CM

## 2021-12-25 DIAGNOSIS — F322 Major depressive disorder, single episode, severe without psychotic features: Secondary | ICD-10-CM

## 2021-12-25 NOTE — Patient Instructions (Addendum)
Visit Information  Patient Goals:  Depressive Symptoms Identified.  Manage Depression Symptoms. Manage Anxiety symptoms  Time Frame:  Short Term Goal Priority:  High Progress:  Not On Track  Start Date:  12/11/21  Expected End Date: 03/04/22  Follow Up Date:  02/19/22 at 2:00 PM   Depressive Symptoms Identified.  Manage Depression Symptoms. Manage Anxiety symptoms   Patient Self Care Activities:  Attends medical appointments  Patient Coping Strengths:  Has emotional support from nephew, Myrissa Chipley  Patient Self Care Deficits:  Financial challenges Sleep issues Depression issues Anxiety issues  Patient Goals:  - spend time or talk with others at least 2 to 3 times per week - practice relaxation or meditation daily - keep a calendar with appointment dates  Follow Up Plan: LCSW to call client on 02/19/22 at 2:00 PM to assess her needs   Norva Riffle.Iesha Summerhill MSW, [employer] representative [facility] Care Management [phone]

## 2021-12-25 NOTE — Chronic Care Management (AMB) (Signed)
Chronic Care Management    Clinical Social Work Note  12/25/2021 Name: Jennifer [name] MRN: [id number] DOB: 01-Apr-1976  Jennifer [name] is a 46 y.o. year old female who is a primary care patient of Lodema Hong, Milus Mallick, MD. The CCM team was consulted to assist the patient with chronic disease management and/or care coordination needs related to: [employer] .   Engaged with patient by telephone for follow up visit in response to provider referral for social work chronic care management and care coordination services.   Consent to Services:  The patient was given information about Chronic Care Management services, agreed to services, and gave verbal consent prior to initiation of services.  Please see initial visit note for detailed documentation.   Patient agreed to services and consent obtained.   Assessment: Review of patient past medical history, allergies, medications, and health status, including review of relevant consultants reports was performed today as part of a comprehensive evaluation and provision of chronic care management and care coordination services.     SDOH (Social Determinants of Health) assessments and interventions performed:  SDOH Interventions    Flowsheet Row Most Recent Value  SDOH Interventions   Financial Strain Interventions Other (Comment)  [financial stress,  she has no income at present]  Stress Interventions Provide Counseling  [client has stress related to financial needs. client has stress related to housing needs]  Depression Interventions/Treatment  Counseling        Advanced Directives Status: See Vynca application for related entries.  CCM Care Plan  No Known Allergies  Outpatient Encounter Medications as of 12/25/2021  Medication Sig Note   acetaminophen (TYLENOL) 500 MG tablet Take 1,000 mg by mouth every 6 (six) hours as needed for mild pain or moderate pain.    acetaminophen-codeine (TYLENOL #3) 300-30 MG tablet Take 1 tablet by  mouth every 8 (eight) hours as needed for moderate pain.    amLODipine (NORVASC) 5 MG tablet TAKE 1 TABLET (5 MG TOTAL) BY MOUTH DAILY.    busPIRone (BUSPAR) 15 MG tablet TAKE 1 TABLET BY MOUTH THREE TIMES A DAY    Cholecalciferol (VITAMIN D3) 1.25 MG (50000 UT) CAPS TAKE 1 CAPSULE BY MOUTH ONE TIME PER WEEK    clotrimazole-betamethasone (LOTRISONE) cream APPLY TO AFFECTED AREA TWICE A DAY    dicyclomine (BENTYL) 10 MG capsule TAKE 1 CAPSULE (10 MG TOTAL) BY MOUTH 2 (TWO) TIMES DAILY AS NEEDED FOR SPASMS.    fluticasone (FLONASE) 50 MCG/ACT nasal spray Place 2 sprays into both nostrils daily. (Patient taking differently: Place 2 sprays into both nostrils daily as needed for allergies.)    hydrOXYzine (ATARAX/VISTARIL) 50 MG tablet TAKE ONE TABLET AT BEDTIME FOR SLEEP (Patient taking differently: Take 50 mg by mouth at bedtime as needed (sleep).)    INCASSIA 0.35 MG tablet TAKE 1 TABLET BY MOUTH EVERY DAY    megestrol (MEGACE) 40 MG tablet 2 tablets daily    meloxicam (MOBIC) 15 MG tablet Take 15 mg by mouth daily.    oxybutynin (DITROPAN-XL) 10 MG 24 hr tablet Take 1 tablet (10 mg total) by mouth daily. 09/09/2021: Pt not taking this med any more she is taking something else    Relugolix-Estradiol-Norethind (MYFEMBREE) 40-1-0.5 MG TABS Take 1 tablet by mouth daily.    tirzepatide [facility]) 2.5 MG/0.5ML Pen Inject 2.5 mg into the skin once a week.    triamterene-hydrochlorothiazide (MAXZIDE) 75-50 MG tablet Take 1 tablet by mouth daily.    venlafaxine XR (EFFEXOR-XR) 150 MG  24 hr capsule TAKE 1 CAPSULE BY MOUTH DAILY WITH BREAKFAST.    No facility-administered encounter medications on file as of 12/25/2021.    Patient Active Problem List   Diagnosis Date Noted   Colovesical fistula 12/08/2021    Class: Question of   Osteoarthritis of both knees 12/03/2021   Controlled diabetes mellitus type 2 with complications (HCC) 09/09/2021   Generalized arthritis 09/09/2021   Snoring 07/06/2021    Financial difficulties 05/09/2021   Reduced vision 04/03/2021   Alternating constipation and diarrhea 12/16/2020   Hematochezia 09/09/2020   Right wrist pain 07/21/2020   Unsteady gait 07/14/2020   Poor urinary stream 05/27/2020   Depression, major, single episode, severe (HCC) 06/18/2019   Environmental allergies 06/18/2019   Candidiasis of skin 01/22/2019   Back pain 11/28/2017   Essential hypertension 03/22/2016   Diverticulosis 11/23/2015   Hemorrhoids 03/26/2015   Insomnia due to other mental disorder 07/10/2014   GAD (generalized anxiety disorder) 07/10/2014   FHx: cancer of digestive organ 02/16/2014   Piles (hemorrhoids) 02/14/2014   Abnormal finding on Pap smear, HPV DNA positive 11/29/2013   Allergic rhinitis 02/12/2013   Annual physical exam 06/24/2012   Prediabetes 03/26/2012   Dyspepsia 03/22/2012   TUBERCULOSIS 11/02/2010   Alopecia (capitis) totalis 11/02/2010   Chronic fatigue 07/23/2009   Morbid obesity (HCC) 11/24/2007   Depression with anxiety 11/24/2007    Conditions to be addressed/monitored: monitor client management of depression and anxiety  Care Plan : LCSW Care Plan  Updates made by Isaiah Blakes, LCSW since 12/25/2021 12:00 AM     Problem: Emotional Distress      Goal: Emotional Health Supported.  Manage Depression issues. Manage Anxiety issues. Manage financial needs   Start Date: 12/11/2021  Expected End Date: 03/03/2022  This Visit's Progress: Not on track  Recent Progress: Not on track  Priority: High  Note:   Current Barriers:  Financial barriers Depression issues Anxiety issues Suicidal Ideation/Homicidal Ideation: No  Clinical Social Work Goal(s):  patient will work with SW  in next 30 days by telephone or in person to reduce or manage symptoms related to depression or anxiety  Client to attend scheduled medical appointments in next 30 days Client to communicate with RNCM as needed for nursing support Client will talk with SW  in next 30 days about financial needs of client  Interventions: 1:1 collaboration with [name], MD regarding development and update of comprehensive plan of care as evidenced by provider attestation and co-signature Discussed client needs with Elianna. Discussed food needs of client. She said she receives food stamps benefit monthly Discussed support from [employer] [facility].  Stephana said that agency helped her recently in paying her light bill. However, she said that agency can only help her one time yearly with light bill Reviewed client support with DSS [place], [place]. She said she has had support from [facility] DSS Reviewed social support of client. She talks daily with her nephew , Domingo Dimes . Miata said she is now staying at home of her niece. Client home has no heating at present.  Discussed financial challenges. She is concerned about not being able to pay her mortgage or car payment. She had been receiving funds from [employer] program through [employer]. She called program recently and they currently do not have available funds Discussed medication procurement of client. She has difficulty sometimes in paying for her medications. She said she sometimes has to borrow money from relatives to  help pay for her medications Provided counseling support for client Reviewed CCM program support Discussed her insurance:  [employer]. She said this is a health plan offered through the [employer] [facility] [place]. Discussed Long Term Disability. She applied and was denied. She appealed decision. She is working with attorney to continue to apply for Long Term Disability.  LCSW mentioned to Giannah that sometimes these Disability Appeals can take a number of months to resolve Discussed client going to [employer] for internet access and looking up information regarding Vocational Rehab. She said she has not been able to go to [employer] yet Client is scheduled to work with [employer], [employer]. LCSW congratulated client on her plans to work with [employer].  Patient Self Care Activities:  Attends medical appointments  Patient Coping Strengths:  Has emotional support from nephew, Tallyn Fittro  Patient Self Care Deficits:  Financial challenges Sleep issues Depression issues Anxiety issues  Patient Goals:  - spend time or talk with others at least 2 to 3 times per week - practice relaxation or meditation daily - keep a calendar with appointment dates  Follow Up Plan: LCSW to call client on 02/19/22 at 2:00 PM to assess her needs      Kelton Pillar.Sutter Ahlgren MSW, LCSW Licensed Visual merchandiser [facility] Care Management [phone]

## 2021-12-28 ENCOUNTER — Other Ambulatory Visit: Payer: Self-pay | Admitting: Obstetrics & Gynecology

## 2021-12-30 ENCOUNTER — Ambulatory Visit: Payer: 59 | Admitting: Nutrition

## 2021-12-31 ENCOUNTER — Telehealth: Payer: Self-pay | Admitting: Gastroenterology

## 2021-12-31 DIAGNOSIS — N321 Vesicointestinal fistula: Secondary | ICD-10-CM

## 2021-12-31 NOTE — Telephone Encounter (Signed)
Please let pt know that I discussed with radiology the most appropriate testing to try and confirm fistula between the sigmoid colon and urinary bladder.   Radiologist advised MRI pelvis with contrast (may require rectal contrast).  She will need updated basic metabolic panel to see what kidney function is now, recently it was elevated.   Dx: concern for colovesical fistula by CT (06/2021) and subsequent cystoscopy with questionable findings.

## 2022-01-01 ENCOUNTER — Other Ambulatory Visit: Payer: Self-pay

## 2022-01-01 ENCOUNTER — Other Ambulatory Visit: Payer: Self-pay | Admitting: Family Medicine

## 2022-01-01 DIAGNOSIS — N321 Vesicointestinal fistula: Secondary | ICD-10-CM

## 2022-01-01 MED ORDER — TIRZEPATIDE 5 MG/0.5ML ~~LOC~~ SOAJ: 5.0000 mg | SUBCUTANEOUS | 0 refills | Status: DC

## 2022-01-01 NOTE — Addendum Note (Signed)
Addended by: Hassan Rowan on: 01/01/2022 10:42 AM   Modules accepted: Orders

## 2022-01-01 NOTE — Telephone Encounter (Signed)
Pt was made aware and verbalized understanding. Lab was ordered. Routing to clinical pool to schedule MRI.

## 2022-01-01 NOTE — Telephone Encounter (Signed)
Called [employer], spoke to Sheldon, she advised they don't do those MRI's just with contrast. She called [facility] radiology and connected MRI tech to call. Explained to MRI tech the reason for MRI. MRI tech advised me to order MRI pelvis with/without contrast.  MRI pelvis w/wo contrast scheduled for 01/15/22 at 11:00am, arrive at 10:30am. Called and informed pt of MRI appt. Letter mailed.  FYI to Neil Crouch PA.

## 2022-01-04 ENCOUNTER — Other Ambulatory Visit: Payer: Self-pay | Admitting: Family Medicine

## 2022-01-04 NOTE — Telephone Encounter (Signed)
Yes, MRI with and without contrast as ordered. Rectal contrast if needed as outlined previously.

## 2022-01-05 NOTE — Telephone Encounter (Signed)
PA for MRI pelvis faxed to [employer].

## 2022-01-06 NOTE — Telephone Encounter (Signed)
Received fax from [employer], MRI approved. PA# [id number], valid 01/05/22-04/04/22.

## 2022-01-07 ENCOUNTER — Encounter: Payer: Self-pay | Admitting: Pulmonary Disease

## 2022-01-10 ENCOUNTER — Other Ambulatory Visit: Payer: Self-pay | Admitting: Family Medicine

## 2022-01-13 NOTE — Telephone Encounter (Signed)
Pre-service center called office. PA was approved for CPT code [phone] (MRI pelvis w/contrast). Pt is having MRI pelvis w/without contrast, CPT code [phone].  ? ?Called [employer], spoke to Trezevant. Previous PA only covers [id number]. New PA will need to be submitted.  ? ?PA for MRI pelvis w/without contrast (CPT code [phone]) faxed to [employer]. ?

## 2022-01-13 NOTE — Telephone Encounter (Signed)
Received fax from [employer], MRI pelvis w/wo contrast approved. PA# [id number], valid 01/13/22-04/15/22. ? ?Tried to call Melissa at pre-service center, [facility] to inform her of [place]. [phone], ext [phone]. ?

## 2022-01-14 ENCOUNTER — Encounter: Payer: Self-pay | Admitting: Obstetrics & Gynecology

## 2022-01-15 ENCOUNTER — Ambulatory Visit (HOSPITAL_COMMUNITY): Payer: 59

## 2022-01-22 ENCOUNTER — Other Ambulatory Visit: Payer: Self-pay | Admitting: Obstetrics & Gynecology

## 2022-01-27 ENCOUNTER — Telehealth: Payer: Self-pay | Admitting: Gastroenterology

## 2022-01-27 NOTE — Telephone Encounter (Signed)
Pt was supposed to have bmp done before MRI. MRI is scheduled for tomorrow and pt did not have done. Does she still need to go have this done? ?

## 2022-01-27 NOTE — Telephone Encounter (Signed)
Pt was made aware to be there no later than 6:45. Pt verbalized understanding.  ?

## 2022-01-27 NOTE — Telephone Encounter (Signed)
Let's have her go today, order lab STAT so it will be back in time. Best if she has done at [facility]. ?

## 2022-01-27 NOTE — Telephone Encounter (Signed)
Patient called asking if she was supposed to have labs before her MRI, said she couldn't remember  ?

## 2022-01-27 NOTE — Telephone Encounter (Signed)
Pt is unable to go to lab today. ?

## 2022-01-27 NOTE — Telephone Encounter (Signed)
She will need to arrive to her appt for her MRI tomorrow no later than the time she was advised which I believe was at 6:45. They will have to do her blood work when she arrives tomorrow, Scientist, research (medical) which is not as good as regular blood draw.  ?

## 2022-01-28 ENCOUNTER — Other Ambulatory Visit: Payer: Self-pay

## 2022-01-28 ENCOUNTER — Ambulatory Visit (HOSPITAL_COMMUNITY)
Admission: RE | Admit: 2022-01-28 | Discharge: 2022-01-28 | Disposition: A | Discharge status: Home / Self Care | Payer: 59 | Source: Ambulatory Visit | Attending: Gastroenterology | Admitting: Gastroenterology

## 2022-01-28 DIAGNOSIS — N321 Vesicointestinal fistula: Secondary | ICD-10-CM | POA: Insufficient documentation

## 2022-01-28 IMAGING — MR MR PELVIS WO/W CM
13 series · 48 of 48 positions shown · IV contrast (gadavist)
Comparison: [DATE] CT abdomen/pelvis.

CLINICAL DATA: History of diverticular disease in the sigmoid colon
with concern for colovesical fistula on prior CT study. Reported
equivocal findings on cystoscopy.

EXAM:
MRI PELVIS WITHOUT AND WITH CONTRAST
TECHNIQUE: Multiplanar multisequence MR imaging of the pelvis was performed
both before and after administration of intravenous contrast.
CONTRAST:  10mL GADAVIST GADOBUTROL 1 MMOL/ML IV SOLN

[Series 3: T2 · sagittal · 3.0mm · 1.19mm/px · 4 of 41 slices shown (1 of 3)]
[im 1/41]
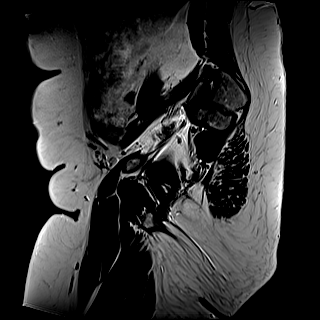
[im 14/41]
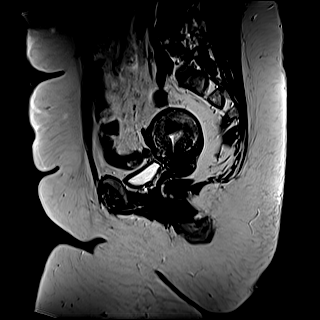
[im 27/41]
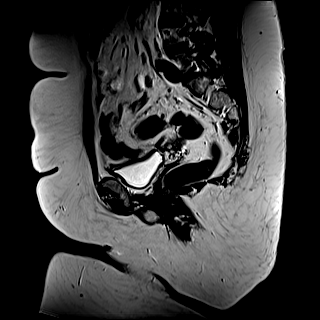
[im 41/41]
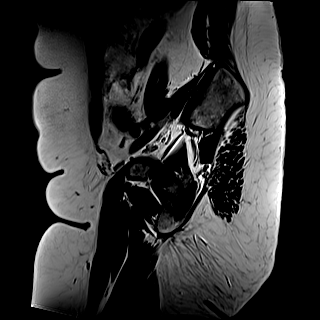

[Series 4: T2 fat-sat · sagittal · 3.0mm · 1.19mm/px · 4 of 41 slices shown (1 of 3)]
[im 1/41]
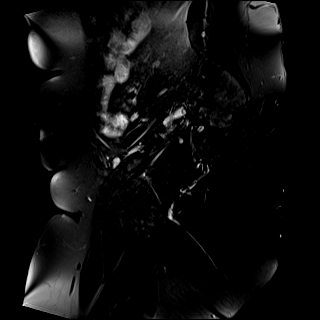
[im 14/41]
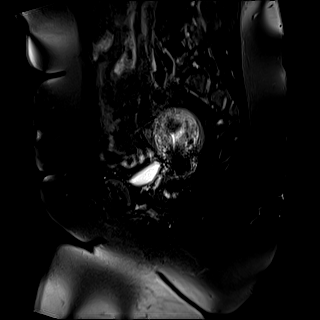
[im 27/41]
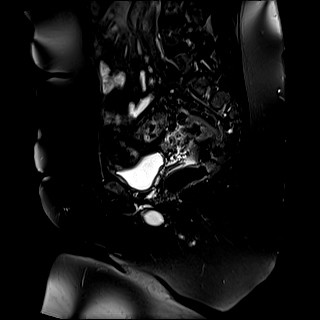
[im 41/41]
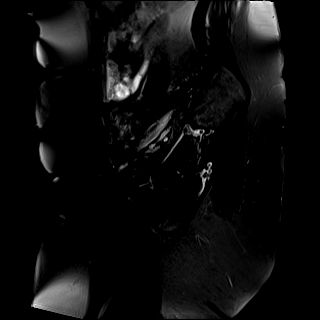

[Series 5: ax dwi_tracew · axial · 4.0mm · 1.48mm/px · z∈[-140,+16]mm · 4 of 40 slices shown (1 of 3)]
[im 1/40]
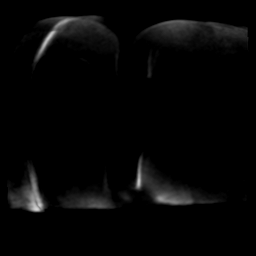
[im 14/40]
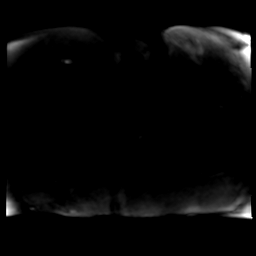
[im 27/40]
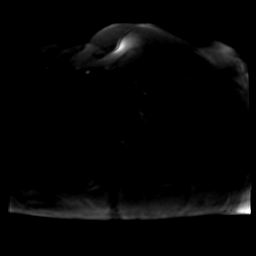
[im 40/40]
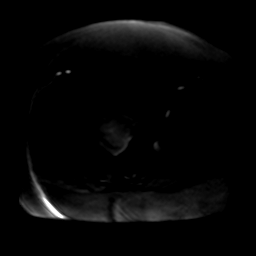

[Series 5: ax dwi_tracew · axial · 4.0mm · 1.48mm/px · z∈[-140,+16]mm · 4 of 40 slices shown (2 of 3)]
[im 1/40]
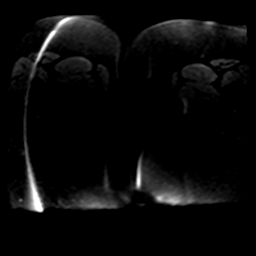
[im 14/40]
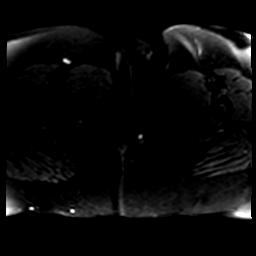
[im 27/40]
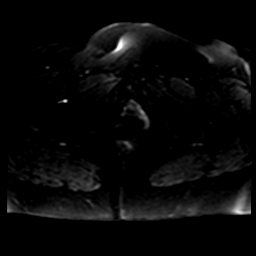
[im 40/40]
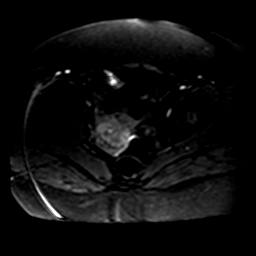

[Series 5: ax dwi_tracew · axial · 4.0mm · 1.48mm/px · z∈[-140,+16]mm · 4 of 40 slices shown (3 of 3)]
[im 1/40]
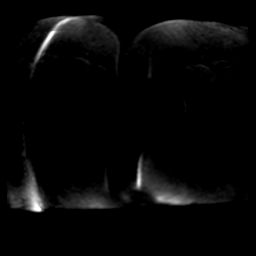
[im 14/40]
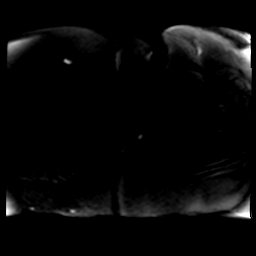
[im 27/40]
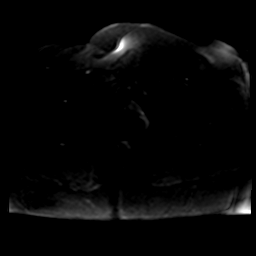
[im 40/40]
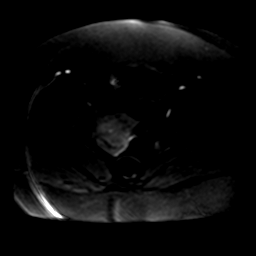

[Series 6: ax dwi_adc · axial · 4.0mm · 1.48mm/px · z∈[-140,+16]mm · 4 of 40 slices shown]
[im 1/40]
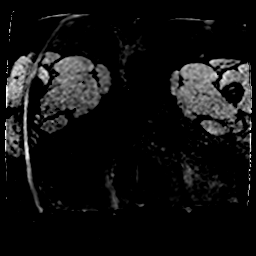
[im 14/40]
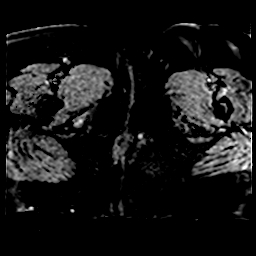
[im 27/40]
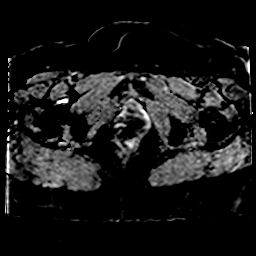
[im 40/40]
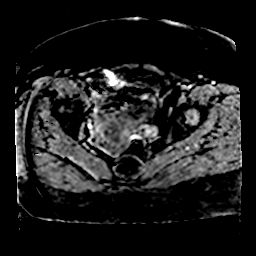

[Series 7: ax dwi_calc_bval · axial · 4.0mm · 1.48mm/px · z∈[-140,+16]mm · 4 of 40 slices shown]
[im 1/40]
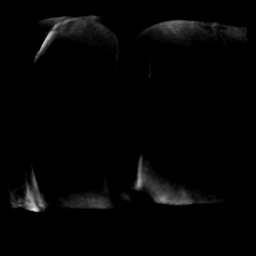
[im 14/40]
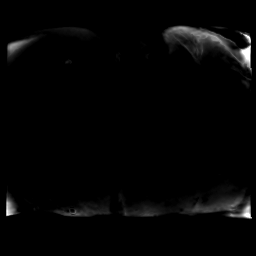
[im 27/40]
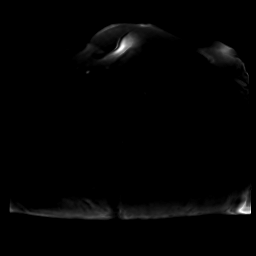
[im 40/40]
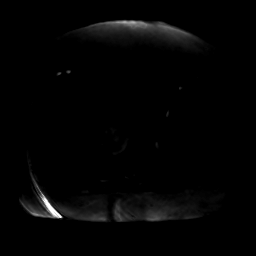

[Series 8: T2 · axial · 5.0mm · 1.32mm/px · z∈[-150,+96]mm · 4 of 42 slices shown (2 of 3)]
[im 1/42]
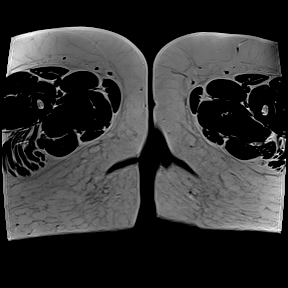
[im 14/42]
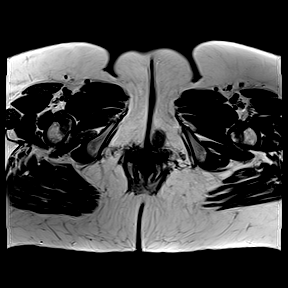
[im 28/42]
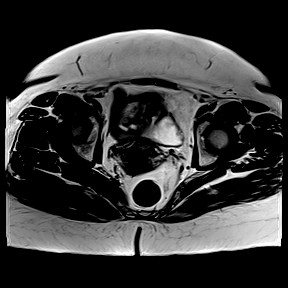
[im 42/42]
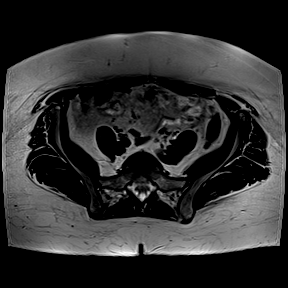

[Series 9: T1 · axial · 4.0mm · 0.86mm/px · z∈[-119,+34]mm · 3 of 33 slices shown]
[im 1/33]
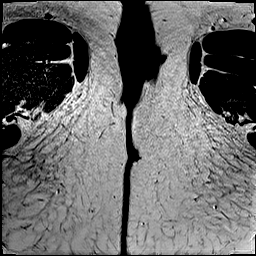
[im 17/33]
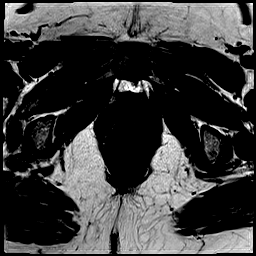
[im 33/33]
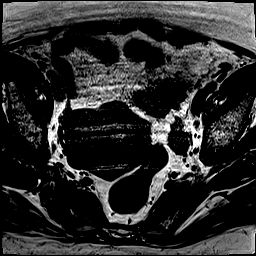

[Series 10: T2 · axial · 4.0mm · 0.86mm/px · z∈[-119,+34]mm · 3 of 33 slices shown (3 of 3)]
[im 1/33]
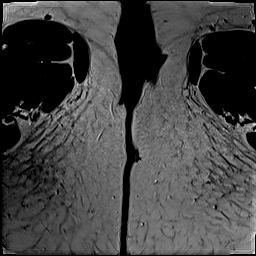
[im 17/33]
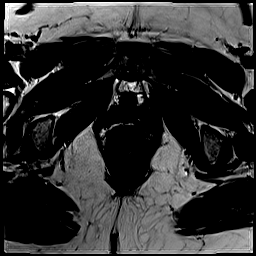
[im 33/33]
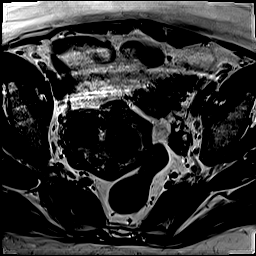

[Series 11: T2 fat-sat · axial · 4.0mm · 0.86mm/px · z∈[-119,+34]mm · 3 of 33 slices shown (2 of 3)]
[im 1/33]
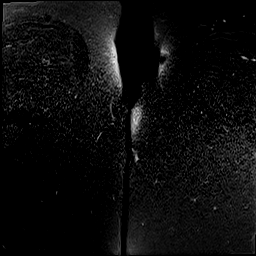
[im 17/33]
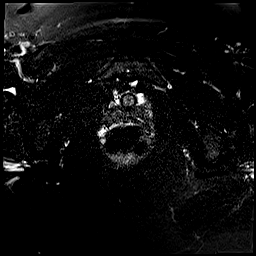
[im 33/33]
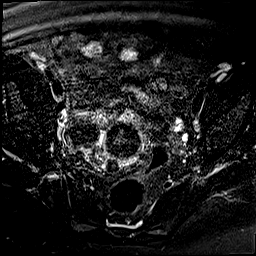

[Series 12: T2 fat-sat · coronal · 4.0mm · 0.75mm/px · 4 of 45 slices shown (3 of 3)]
[im 1/45]
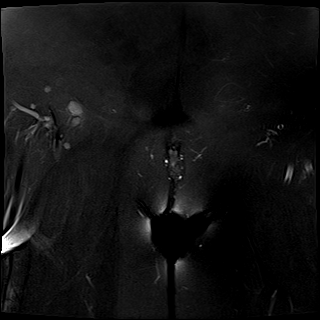
[im 15/45]
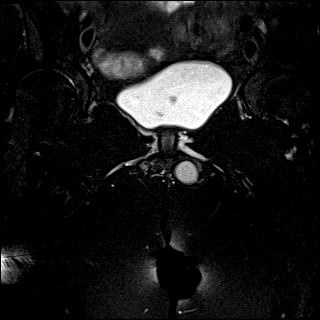
[im 30/45]
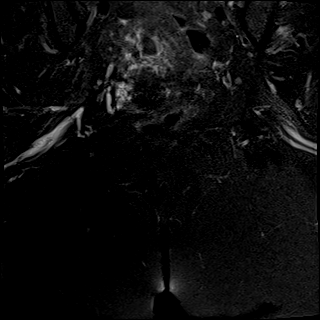
[im 45/45]
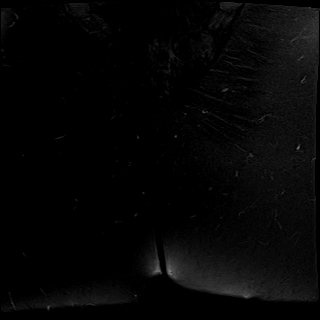

[Series 13: T1 fat-sat post-contrast · axial · 4.0mm · 0.86mm/px · z∈[-119,+34]mm · 3 of 33 slices shown]
[im 1/33]
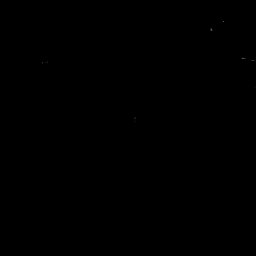
[im 17/33]
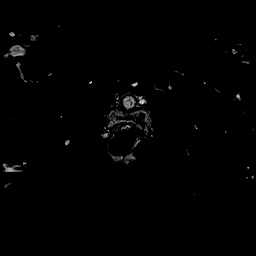
[im 33/33]
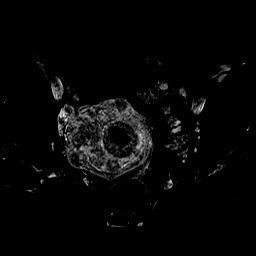

[48 of 48 positions shown; findings below may reference images not displayed]

FINDINGS: Urinary Tract: No definite bladder wall thickening or appreciable
focal bladder wall lesion. No significant bladder distention. No
obvious gas within the bladder lumen on this MRI study. Normal
urethra.

Bowel: Moderate sigmoid diverticulosis. No evidence of associated
acute colonic wall thickening or acute pericolonic fat stranding. No
dilated or thick-walled pelvic small bowel loops. No appreciable
fistula tract from the bowel to the bladder on this study. No
pericolonic fluid collections. There is an apparent small
transsphincteric perianal fistula in the 1 o'clock inferior anal
wall extending inferiorly in the medial left gluteal fold to the
skin surface at the medial left gluteal cleft with mildly thickened
enhancing wall (series 13/images 24-31 and series 12/image 25),
which appears chronic and unchanged since [DATE] CT. No
associated abscess.

Vascular/Lymphatic: No acute vascular abnormality. No pathologically
enlarged pelvic lymph nodes.

Reproductive: Mildly enlarged myomatous uterus measuring 7.9 x 6.8 x
7.6 cm. Representative left posterior upper uterine body submucosal
3.4 x 2.8 x 3.1 cm fibroid. Representative right fundal 3.0 x 2.5 x
2.8 cm submucosal fibroid. Ovaries appear symmetric and small. No
suspicious adnexal masses.

Other: Left vaginal introitus 2.8 x 2.0 x 1.8 cm circumscribed ovoid
cystic lesion (series 8/image 27) with mild homogeneous T1
hyperintensity and no compelling evidence of enhancement, favoring a
hemorrhagic/proteinaceous Bartholin gland cyst.

Musculoskeletal: No aggressive appearing focal osseous lesions.
IMPRESSION: 1. Apparent small chronic transsphincteric perianal fistula in the 1
o'clock inferior anal wall extending inferiorly in the medial left
gluteal fold to the skin surface at the medial left gluteal cleft.
No associated abscess. This finding was present on [DATE] CT
abdomen/pelvis study.
2. Moderate sigmoid diverticulosis. No MRI findings of acute sigmoid
diverticulitis. No MRI findings of colovesical fistula. No
appreciable bladder wall thickening or bladder gas.
3. Mildly enlarged myomatous uterus as detailed.
4. Hemorrhagic/proteinaceous left Bartholin gland cyst.

## 2022-01-28 MED ORDER — GADOBUTROL 1 MMOL/ML IV SOLN
10.0000 mL | Freq: Once | INTRAVENOUS | Status: AC | PRN
  Administered 2022-01-28: 10 mL via INTRAVENOUS

## 2022-01-29 LAB — BASIC METABOLIC PANEL
BUN/Creatinine Ratio: 12 (calc) (ref 6–22)
BUN: 16 mg/dL (ref 7–25)
CO2: 20 mmol/L (ref 20–32)
Calcium: 10.4 mg/dL — ABNORMAL HIGH (ref 8.6–10.2)
Chloride: 103 mmol/L (ref 98–110)
Creat: 1.37 mg/dL — ABNORMAL HIGH (ref 0.50–0.99)
Glucose, Bld: 96 mg/dL (ref 65–99)
Potassium: 4.1 mmol/L (ref 3.5–5.3)
Sodium: 141 mmol/L (ref 135–146)

## 2022-02-02 ENCOUNTER — Other Ambulatory Visit (HOSPITAL_COMMUNITY): Payer: Self-pay | Admitting: General Surgery

## 2022-02-02 ENCOUNTER — Telehealth: Payer: Self-pay | Admitting: *Deleted

## 2022-02-02 ENCOUNTER — Telehealth: Payer: Self-pay

## 2022-02-02 NOTE — Telephone Encounter (Signed)
Notes scanned to referral 

## 2022-02-02 NOTE — Telephone Encounter (Signed)
? ?  Pre-operative Risk Assessment  ?  ?Patient Name: Jennifer [name]  ?DOB: [birth year] ?MRN: [id number]  ? ?  ?PT IS BEING REFERRED TO CARDIOLOGY FOR PRE OP EVALUATION. PT WILL NEED NEW PT APPT. WILL SEND MESSAGE TO CHART PREP TEAM AND SCHEDULING TO REACH OUT TO THE PT WITH A NEW PT APPT. I WILL HAND OFF THE RECORDS RECEIVED TO ROSE JACOBS, CMA. ?Request for Surgical Clearance   ? ?Procedure:   SLEEVE GASTRECTOMY  ? ?Date of Surgery:  Clearance TBD                              ?   ?Surgeon:  DR. Gaynelle Adu ?Surgeon's Group or Practice Name:  [facility] ?Phone number:  [phone] ?Fax number:  [phone] ?  ?Type of Clearance Requested:   ?- Medical  ?  ?Type of Anesthesia:  General  ?  ?Additional requests/questions:   ? ?Signed, ?Danielle Rankin   ?02/02/2022, 11:01 AM  ? ?

## 2022-02-02 NOTE — Telephone Encounter (Signed)
Pt has appt with Dr. Phineas Inches 02/08/22. Will forward clearance notes to MD for upcoming appt. Will send FYI to requesting office the pt has appt 02/08/22.  ?

## 2022-02-05 ENCOUNTER — Other Ambulatory Visit: Payer: Self-pay

## 2022-02-05 ENCOUNTER — Ambulatory Visit (INDEPENDENT_AMBULATORY_CARE_PROVIDER_SITE_OTHER): Payer: 59 | Admitting: Family Medicine

## 2022-02-05 ENCOUNTER — Encounter: Payer: Self-pay | Admitting: Family Medicine

## 2022-02-05 VITALS — BP 117/83 | HR 118 | Ht 66.0 in | Wt 314.0 lb

## 2022-02-05 DIAGNOSIS — F322 Major depressive disorder, single episode, severe without psychotic features: Secondary | ICD-10-CM

## 2022-02-05 DIAGNOSIS — M5440 Lumbago with sciatica, unspecified side: Secondary | ICD-10-CM

## 2022-02-05 DIAGNOSIS — F99 Mental disorder, not otherwise specified: Secondary | ICD-10-CM

## 2022-02-05 DIAGNOSIS — F5105 Insomnia due to other mental disorder: Secondary | ICD-10-CM

## 2022-02-05 DIAGNOSIS — I10 Essential (primary) hypertension: Secondary | ICD-10-CM

## 2022-02-05 DIAGNOSIS — Z13220 Encounter for screening for lipoid disorders: Secondary | ICD-10-CM

## 2022-02-05 DIAGNOSIS — R7303 Prediabetes: Secondary | ICD-10-CM | POA: Diagnosis not present

## 2022-02-05 DIAGNOSIS — F411 Generalized anxiety disorder: Secondary | ICD-10-CM

## 2022-02-05 MED ORDER — OXYBUTYNIN CHLORIDE ER 10 MG PO TB24: 10.0000 mg | ORAL_TABLET | Freq: Every day | ORAL | 2 refills | Status: DC

## 2022-02-05 NOTE — Progress Notes (Signed)
? ?AJSA [name]     MRN: [id number]      DOB: 11-Dec-1975 ? ? ?HPI ?Jennifer [name] is here for follow up and re-evaluation of chronic medical conditions, medication management and review of any available recent lab and radiology data.  ?Preventive health is updated, specifically  Cancer screening and Immunization.   ?Questions or concerns regarding consultations or procedures which the PT has had in the interim are  addressed.Very encouraged by recent bariatric surgery eval where she is considered a good candidate ?The PT denies any adverse reactions to current medications since the last visit.  ?There are no new concerns.  ?There are no specific complaints  ? ?ROS ?Denies recent fever or chills. ?Denies sinus pressure, nasal congestion, ear pain or sore throat. ?Denies chest congestion, productive cough or wheezing. ?Denies chest pains, palpitations and leg swelling ?Denies abdominal pain, nausea, vomiting,diarrhea or constipation.   ?Denies dysuria, frequency, hesitancy or incontinence. ?C/o back and leg pain , limiting mobility, ambulating with a cane ?Denies headaches, seizures, numbness, or tingling. ?Denies depression, anxiety or insomnia. ?Denies skin break down or rash. ? ? ?PE ? ?BP 117/83 (BP Location: Left Arm)   Pulse (!) 118   Ht 5\' 6"  (1.676 m)   Wt (!) 314 lb (142.4 kg)   SpO2 96%   BMI 50.68 kg/m?  ? ?Patient alert and oriented and in no cardiopulmonary distress. ? ?HEENT: No facial asymmetry, EOMI,     Neck supple . ? ?Chest: Clear to auscultation bilaterally. ? ?CVS: S1, S2 no murmurs, no S3.Regular rate. ? ?ABD: Soft non tender.  ? ?Ext: No edema ? ?MS: decreased ROM lumbar  spine, adequate in shoulders, hips and knees. ? ?Skin: Intact, no ulcerations or rash noted. ? ?Psych: Good eye contact, normal affect. Memory intact not anxious or depressed appearing. ? ?CNS: CN 2-12 intact, power,  normal throughout.no focal deficits noted. ? ? ?Assessment & Plan ? ?Morbid obesity (HCC) ?She is  applaufded on thios ? ?Patient re-educated about  the importance of commitment to a  minimum of 150 minutes of exercise per week as able. ? ?The importance of healthy food choices with portion control discussed, as well as eating regularly and within a 12 hour window most days. ?The need to choose "clean , green" food 50 to 75% of the time is discussed, as well as to make water the primary drink and set a goal of 64 ounces water daily. ? ?  ? ?  02/05/2022  ?  9:48 AM 12/08/2021  ?  9:08 AM 12/03/2021  ?  9:08 AM  ?Weight /BMI  ?Weight 314 lb 323 lb 325 lb  ?Height 5\' 6"  (1.676 m) 5\' 7"  (1.702 m) 5\' 7"  (1.702 m)  ?BMI 50.68 kg/m2 50.59 kg/m2 50.9 kg/m2  ? ? ? ? ?Prediabetes ?Patient educated about the importance of limiting  Carbohydrate intake , the need to commit to daily physical activity for a minimum of 30 minutes , and to commit weight loss. ?The fact that changes in all these areas will reduce or eliminate all together the development of diabetes is stressed.  ? ? ?  Latest Ref Rng & Units 01/28/2022  ?  8:15 AM 12/03/2021  ? 10:18 AM 05/06/2021  ? 10:59 AM 11/25/2020  ?  9:18 AM 09/04/2020  ? 11:26 AM  ?Diabetic Labs  ?HbA1c 4.8 - 5.6 %  6.2   6.5    6.4    ?Chol 100 - 199 mg/dL   810      ?  HDL >39 mg/dL   49      ?Calc LDL 0 - 99 mg/dL   88      ?Triglycerides 0 - 149 mg/dL   79      ?Creatinine 0.50 - 0.99 mg/dL 4.13   2.44   0.10   2.72     ? ? ?  02/05/2022  ?  9:48 AM 12/08/2021  ?  9:08 AM 12/03/2021  ? 10:04 AM 12/03/2021  ?  9:08 AM 11/24/2021  ? 10:52 AM 09/09/2021  ? 10:12 AM 09/02/2021  ?  4:44 PM  ?BP/Weight  ?Systolic BP 117 150 140 167  125   ?Diastolic BP 83 91 100 85  87   ?Wt. (Lbs) 314 323  325 326 335.04 335  ?BMI 50.68 kg/m2 50.59 kg/m2  50.9 kg/m2 51.06 kg/m2 52.47 kg/m2 52.47 kg/m2  ? ?   ? View : No data to display.  ?  ?  ?  ? ? ? ? ?Essential hypertension ?DASH diet and commitment to daily physical activity for a minimum of 30 minutes discussed and encouraged, as a part of hypertension  management. ?The importance of attaining a healthy weight is also discussed. ? ? ?  02/05/2022  ?  9:48 AM 12/08/2021  ?  9:08 AM 12/03/2021  ? 10:04 AM 12/03/2021  ?  9:08 AM 11/24/2021  ? 10:52 AM 09/09/2021  ? 10:12 AM 09/02/2021  ?  4:44 PM  ?BP/Weight  ?Systolic BP 117 150 140 167  125   ?Diastolic BP 83 91 100 85  87   ?Wt. (Lbs) 314 323  325 326 335.04 335  ?BMI 50.68 kg/m2 50.59 kg/m2  50.9 kg/m2 51.06 kg/m2 52.47 kg/m2 52.47 kg/m2  ? ? ?Controlled, no change in medication ? ? ? ?GAD (generalized anxiety disorder) ?Controlled, no change in medication ? ? ?Insomnia due to other mental disorder ?Improved , continue medication as needed, sleep hygiene reviewed briefly ? ?Depression, major, single episode, severe (HCC) ?Markedly improved, continue current medication ? ?Back pain ?Advised weight loss and tylenol ? ?

## 2022-02-05 NOTE — Assessment & Plan Note (Signed)
Controlled, no change in medication  

## 2022-02-05 NOTE — Assessment & Plan Note (Signed)
Markedly improved, continue current medication ?

## 2022-02-05 NOTE — Assessment & Plan Note (Signed)
Improved , continue medication as needed, sleep hygiene reviewed briefly ?

## 2022-02-05 NOTE — Assessment & Plan Note (Signed)
She is applaufded on thios ? ?Patient re-educated about  the importance of commitment to a  minimum of 150 minutes of exercise per week as able. ? ?The importance of healthy food choices with portion control discussed, as well as eating regularly and within a 12 hour window most days. ?The need to choose "clean , green" food 50 to 75% of the time is discussed, as well as to make water the primary drink and set a goal of 64 ounces water daily. ? ?  ? ?  02/05/2022  ?  9:48 AM 12/08/2021  ?  9:08 AM 12/03/2021  ?  9:08 AM  ?Weight /BMI  ?Weight 314 lb 323 lb 325 lb  ?Height '5\' 6"'$  (1.676 m) '5\' 7"'$  (1.702 m) '5\' 7"'$  (1.702 m)  ?BMI 50.68 kg/m2 50.59 kg/m2 50.9 kg/m2  ? ? ? ?

## 2022-02-05 NOTE — Patient Instructions (Addendum)
F/U in 10 weeks, call if you need me before ? ?Congrats on excellent weight loss , keep it up! ? ?Blood pressure is at goal ? ?Fasting lipid, cmp and eGFr and HBA1C 5 days before visit if possible, if not the day of the visit ? ? As you lose weight pain will lessen, I recvommend tylenol for pain , 500 mg up to 4 tablets daily ? ?Thanks for choosing [facility], we consider it a privelige to serve you. ? ?

## 2022-02-05 NOTE — Assessment & Plan Note (Signed)
DASH diet and commitment to daily physical activity for a minimum of 30 minutes discussed and encouraged, as a part of hypertension management. ?The importance of attaining a healthy weight is also discussed. ? ? ?  02/05/2022  ?  9:48 AM 12/08/2021  ?  9:08 AM 12/03/2021  ? 10:04 AM 12/03/2021  ?  9:08 AM 11/24/2021  ? 10:52 AM 09/09/2021  ? 10:12 AM 09/02/2021  ?  4:44 PM  ?BP/Weight  ?Systolic BP 932 419 914 445  125   ?Diastolic BP 83 91 848 85  87   ?Wt. (Lbs) 314 323  325 326 335.04 335  ?BMI 50.68 kg/m2 50.59 kg/m2  50.9 kg/m2 51.06 kg/m2 52.47 kg/m2 52.47 kg/m2  ? ? ?Controlled, no change in medication ? ? ?

## 2022-02-05 NOTE — Assessment & Plan Note (Signed)
Advised weight loss and tylenol ?

## 2022-02-05 NOTE — Assessment & Plan Note (Signed)
Patient educated about the importance of limiting  Carbohydrate intake , the need to commit to daily physical activity for a minimum of 30 minutes , and to commit weight loss. ?The fact that changes in all these areas will reduce or eliminate all together the development of diabetes is stressed.  ? ? ?  Latest Ref Rng & Units 01/28/2022  ?  8:15 AM 12/03/2021  ? 10:18 AM 05/06/2021  ? 10:59 AM 11/25/2020  ?  9:18 AM 09/04/2020  ? 11:26 AM  ?Diabetic Labs  ?HbA1c 4.8 - 5.6 %  6.2   6.5    6.4    ?Chol 100 - 199 mg/dL   152      ?HDL >39 mg/dL   49      ?Calc LDL 0 - 99 mg/dL   88      ?Triglycerides 0 - 149 mg/dL   79      ?Creatinine 0.50 - 0.99 mg/dL 1.37   1.20   0.85   0.88     ? ? ?  02/05/2022  ?  9:48 AM 12/08/2021  ?  9:08 AM 12/03/2021  ? 10:04 AM 12/03/2021  ?  9:08 AM 11/24/2021  ? 10:52 AM 09/09/2021  ? 10:12 AM 09/02/2021  ?  4:44 PM  ?BP/Weight  ?Systolic BP 654 650 354 656  125   ?Diastolic BP 83 91 812 85  87   ?Wt. (Lbs) 314 323  325 326 335.04 335  ?BMI 50.68 kg/m2 50.59 kg/m2  50.9 kg/m2 51.06 kg/m2 52.47 kg/m2 52.47 kg/m2  ? ?   ? View : No data to display.  ?  ?  ?  ? ? ? ?

## 2022-02-08 ENCOUNTER — Other Ambulatory Visit: Payer: Self-pay

## 2022-02-08 ENCOUNTER — Ambulatory Visit: Payer: 59 | Admitting: Internal Medicine

## 2022-02-08 VITALS — BP 126/88 | HR 106 | Ht 67.0 in | Wt 315.8 lb

## 2022-02-08 DIAGNOSIS — Z01818 Encounter for other preprocedural examination: Secondary | ICD-10-CM

## 2022-02-08 NOTE — Patient Instructions (Signed)
Medication Instructions:  ?No Changes In Medications at this time.  ?*If you need a refill on your cardiac medications before your next appointment, please call your pharmacy* ? ?Follow-Up: ?At [facility], you and your health needs are our priority.  As part of our continuing mission to provide you with exceptional heart care, we have created designated Provider Care Teams.  These Care Teams include your primary Cardiologist (physician) and Advanced Practice Providers (APPs -  Physician Assistants and Nurse Practitioners) who all work together to provide you with the care you need, when you need it. ? ?Your next appointment:   ?6 month(s) ? ?The format for your next appointment:   ?In Person ? ?Provider:   ?Janina Mayo, MD   ?

## 2022-02-08 NOTE — Progress Notes (Signed)
?Cardiology Office Note:   ? ?Date:  02/08/2022  ? ?ID:  [name], DOB 1976-05-12, MRN [id number] ? ?PCP:  [name], MD ?  ?[facility] Providers ?Cardiologist:  Maisie Fus, MD    ? ?Referring MD: Gaynelle Adu, MD  ? ?No chief complaint on file. ?Pre-op evaluation ? ?History of Present Illness:   ? ?Jennifer [name] is a 46 y.o. female with a hx of GERD, severe obesity BMI 50, referral for pre-op for sleeve gastrectomy ? ?She has trouble ambulating. She walked up a flight of stairs without chest pressure. She is deconditioned and she can get winded. She has prediabetes A1c 6.2%. She's done a sleep study and noted she was diagnosed with sleep apnea and planning to receive a cpap machine. ? ?She has no family hx of premature CAD. Her sister had an MI at 45 years of age, deceased. ? ?No smoking cigarettes ? ?No prior cardiology visits. No hx of stress test. ? ?Has HTN well managed ? ?EKG 01/22/2022- sinus tachycardia 102 bpm ? ?Past Medical History:  ?Diagnosis Date  ? Abnormal vaginal Pap smear   ? Acid reflux   ? Alopecia   ? Anxiety   ? Arthritis   ? Depression   ? Diabetes mellitus without complication (HCC)   ? prediabetic, takes no meds  ? Diverticulosis   ? Family history of diabetes mellitus   ? GERD (gastroesophageal reflux disease)   ? Hemorrhoids   ? History of recurrent UTIs   ? Hypertension   ? Obesity   ? ? ?Past Surgical History:  ?Procedure Laterality Date  ? APPENDECTOMY    ? COLONOSCOPY N/A 03/13/2014  ? Dr. Jena Gauss- grade 3 hemorrhoids, colonic diverticulosis bx= benign lymphoid polyp  ? COLONOSCOPY WITH PROPOFOL N/A 01/26/2021  ? Procedure: COLONOSCOPY WITH PROPOFOL;  Surgeon: Corbin Ade, MD;  [facility]: [facility];  Service: Endoscopy;  Laterality: N/A;  AM  ? KNEE ARTHROSCOPY    ? right  ? POLYPECTOMY  01/26/2021  ? Procedure: POLYPECTOMY;  Surgeon: Corbin Ade, MD;  Location: [facility];  Service: Endoscopy;;  ? ? ?Current Medications: ?Current Outpatient Medications  on File Prior to Visit  ?Medication Sig Dispense Refill  ? amLODipine (NORVASC) 5 MG tablet TAKE 1 TABLET (5 MG TOTAL) BY MOUTH DAILY. 90 tablet 1  ? busPIRone (BUSPAR) 15 MG tablet TAKE 1 TABLET BY MOUTH THREE TIMES A DAY 270 tablet 1  ? clotrimazole-betamethasone (LOTRISONE) cream APPLY TO AFFECTED AREA TWICE A DAY 45 g 1  ? fluticasone (FLONASE) 50 MCG/ACT nasal spray Place 2 sprays into both nostrils daily. (Patient taking differently: Place 2 sprays into both nostrils daily as needed for allergies.) 16 g 6  ? hydrOXYzine (ATARAX/VISTARIL) 50 MG tablet TAKE ONE TABLET AT BEDTIME FOR SLEEP (Patient taking differently: Take 50 mg by mouth at bedtime as needed (sleep).) 90 tablet 0  ? INCASSIA 0.35 MG tablet TAKE 1 TABLET BY MOUTH EVERY DAY 84 tablet 1  ? megestrol (MEGACE) 40 MG tablet 2 tablets daily 60 tablet 1  ? meloxicam (MOBIC) 15 MG tablet Take 15 mg by mouth daily.    ? oxybutynin (DITROPAN XL) 10 MG 24 hr tablet Take 1 tablet (10 mg total) by mouth at bedtime. 30 tablet 2  ? tirzepatide (MOUNJARO) 5 MG/0.5ML Pen Inject 5 mg into the skin once a week. 6 mL 0  ? triamterene-hydrochlorothiazide (MAXZIDE) 75-50 MG tablet TAKE 1 TABLET BY MOUTH EVERY DAY 90  tablet 3  ? venlafaxine XR (EFFEXOR-XR) 150 MG 24 hr capsule TAKE 1 CAPSULE BY MOUTH DAILY WITH BREAKFAST. 90 capsule 3  ? ?No current facility-administered medications on file prior to visit.  ? ? ? ?Allergies:   Patient has no known allergies.  ? ?Social History  ? ?Socioeconomic History  ? Marital status: Single  ?  Spouse name: Not on file  ? Number of children: 4  ? Years of education: Not on file  ? Highest education level: Some college, no degree  ?Occupational History  ? Occupation: UNEMPLOYWED SINCE 10/2010  ?Tobacco Use  ? Smoking status: Never  ? Smokeless tobacco: Never  ?Vaping Use  ? Vaping Use: Never used  ?Substance and Sexual Activity  ? Alcohol use: No  ? Drug use: No  ? Sexual activity: Not Currently  ?  Birth control/protection: Pill   ?Other Topics Concern  ? Not on file  ?Social History Narrative  ? Not on file  ? ?Social Determinants of Health  ? ?Financial Resource Strain: High Risk  ? Difficulty of Paying Living Expenses: Very hard  ?Food Insecurity: No Food Insecurity  ? Worried About Programme researcher, broadcasting/film/video in the Last Year: Never true  ? Ran Out of Food in the Last Year: Never true  ?Transportation Needs: No Transportation Needs  ? Lack of Transportation (Medical): No  ? Lack of Transportation (Non-Medical): No  ?Physical Activity: Inactive  ? Days of Exercise per Week: 0 days  ? Minutes of Exercise per Session: 0 min  ?Stress: Stress Concern Present  ? Feeling of Stress : To some extent  ?Social Connections: Moderately Isolated  ? Frequency of Communication with Friends and Family: More than three times a week  ? Frequency of Social Gatherings with Friends and Family: Three times a week  ? Attends Religious Services: 1 to 4 times per year  ? Active Member of Clubs or Organizations: No  ? Attends Banker Meetings: Never  ? Marital Status: Never married  ?  ? ?Family History: ?The patient's family history includes Breast cancer in her maternal aunt; Diabetes in her mother; Hypertension in her mother; Other in her mother. There is no history of Colon cancer, Tuberous sclerosis, or Alpha-1 antitrypsin deficiency. She was adopted. ? ?ROS:   ?Please see the history of present illness.    ? All other systems reviewed and are negative. ? ?EKGs/Labs/Other Studies Reviewed:   ? ?The following studies were reviewed today: ? ? ?EKG:  EKG is  ordered today.  The ekg ordered today demonstrates  ? ?EKG- 02/08/2022:Sinus tachycardia, low voltage/septal q likely 2/2 habitus ? ?Recent Labs: ?05/06/2021: ALT 21 ?12/03/2021: Hemoglobin 13.4; Platelets 385; TSH 2.740 ?01/28/2022: BUN 16; Creat 1.37; Potassium 4.1; Sodium 141  ?Recent Lipid Panel ?   ?Component Value Date/Time  ? CHOL 152 05/06/2021 1059  ? TRIG 79 05/06/2021 1059  ? HDL 49 05/06/2021  1059  ? CHOLHDL 3.1 05/06/2021 1059  ? CHOLHDL 2.3 05/29/2020 1045  ? VLDL 9 12/08/2016 1401  ? LDLCALC 88 05/06/2021 1059  ? LDLCALC 62 05/29/2020 1045  ? ? ?The 10-year ASCVD risk score (Arnett DK, et al., 2019) is: 5.6% ?  Values used to calculate the score: ?    Age: 8 years ?    Sex: Female ?    Is Non-Hispanic African American: Yes ?    Diabetic: Yes ?    Tobacco smoker: No ?    Systolic Blood Pressure: 126 mmHg ?  Is BP treated: Yes ?    HDL Cholesterol: 49 mg/dL ?    Total Cholesterol: 152 mg/dL ? ? ? ?Risk Assessment/Calculations:   ?  ? ?    ? ?Physical Exam:   ? ?VS:   ? ?Vitals:  ? 02/08/22 1113  ?BP: 126/88  ?Pulse: (!) 106  ?SpO2: 98%  ? ? ? ?Wt Readings from Last 3 Encounters:  ?02/08/22 (!) 315 lb 12.8 oz (143.2 kg)  ?02/05/22 (!) 314 lb (142.4 kg)  ?12/08/21 (!) 323 lb (146.5 kg)  ?  ? ?GEN:  Well nourished, well developed in no acute distress ?HEENT: Normal ?NECK: No JVD; No carotid bruits ?LYMPHATICS: No lymphadenopathy ?CARDIAC: RRR, no murmurs, rubs, gallops ?RESPIRATORY:  Clear to auscultation without rales, wheezing or rhonchi  ?ABDOMEN: Soft, non-tender, non-distended ?MUSCULOSKELETAL:  No edema; No deformity  ?SKIN: Warm and dry ?NEUROLOGIC:  Alert and oriented x 3 ?PSYCHIATRIC:  Normal affect  ? ?ASSESSMENT:   ? ?Cardiac Preoperative Risk Assessment: She can do > 4 METS without chest pressure. She is deconditioned 2/2 weight.  RCRI 3.9% for an adverse 30 day cardiac event. She does not have high CVD risk. She does not require further ischemic risk assessment at this time prior to her procedure. Will recommend continued antihypertensives, her blood pressure is at goal < 130/80 mmHg.   Recommend to continue with lifestyle modification and CVD risk mitigation (yearly A1c, lipid monitoring). ? ? ?PLAN:   ? ?In order of problems listed above: ? ?Acceptable cardiac risk for surgery ?Follow up in 6 months ? ?   ? ?   ?Medication Adjustments/Labs and Tests Ordered: ?Current medicines are  reviewed at length with the patient today.  Concerns regarding medicines are outlined above.  ?No orders of the defined types were placed in this encounter. ? ?No orders of the defined types were placed in this en

## 2022-02-16 ENCOUNTER — Telehealth: Payer: Self-pay | Admitting: Family Medicine

## 2022-02-16 DIAGNOSIS — I10 Essential (primary) hypertension: Secondary | ICD-10-CM

## 2022-02-16 NOTE — Telephone Encounter (Signed)
Pt aware to drink more water and rept labs in 6 weeks  ?

## 2022-02-19 ENCOUNTER — Telehealth: Payer: 59

## 2022-02-22 ENCOUNTER — Ambulatory Visit: Payer: 59 | Admitting: Licensed Clinical Social Worker

## 2022-02-22 DIAGNOSIS — E118 Type 2 diabetes mellitus with unspecified complications: Secondary | ICD-10-CM

## 2022-02-22 DIAGNOSIS — F322 Major depressive disorder, single episode, severe without psychotic features: Secondary | ICD-10-CM

## 2022-02-22 DIAGNOSIS — F5105 Insomnia due to other mental disorder: Secondary | ICD-10-CM

## 2022-02-22 DIAGNOSIS — F411 Generalized anxiety disorder: Secondary | ICD-10-CM

## 2022-02-22 DIAGNOSIS — Z599 Problem related to housing and economic circumstances, unspecified: Secondary | ICD-10-CM

## 2022-02-22 DIAGNOSIS — I10 Essential (primary) hypertension: Secondary | ICD-10-CM

## 2022-02-22 DIAGNOSIS — M170 Bilateral primary osteoarthritis of knee: Secondary | ICD-10-CM

## 2022-02-22 NOTE — Chronic Care Management (AMB) (Signed)
Care Management ?Clinical Social Work Note ? ?02/22/2022 ?Name: Jennifer [name] MRN: [id number] DOB: [birth year] ? ?Jennifer [name] is a 46 y.o. year old female who is a primary care patient of [name], MD.  The Care Management team was consulted for assistance with chronic disease management and coordination needs. ? ?Engaged with patient by telephone for follow up visit in response to provider referral for social work chronic care management and care coordination services ? ?Consent to Services:  ?Jennifer [name] was given information about Care Management services today including:  ?Care Management services includes personalized support from designated clinical staff supervised by her physician, including individualized [name] of care and coordination with other care providers ?24/7 contact phone numbers for assistance for urgent and routine care needs. ?The patient may stop case management services at any time by phone call to the office staff. ? ?Patient agreed to services and consent obtained.  ? ?Assessment: Review of patient past medical history, allergies, medications, and health status, including review of relevant consultants reports was performed today as part of a comprehensive evaluation and provision of chronic care management and care coordination services. ? ?SDOH (Social Determinants of Health) assessments and interventions performed:  ?SDOH Interventions   ? ?Flowsheet Row Most Recent Value  ?SDOH Interventions   ?Financial Strain Interventions Other (Comment)  [client is applying for Disability]  ?Physical Activity Interventions Other (Comments)  [walking challenges. she uses a cane to help her walk]  ?Stress Interventions Provide Counseling  [client has stress related to financial challenges. client has stress in managing medical needs]  ? ?  ?  ? ?Advanced Directives Status: See [employer] application for related entries. ? ?Care [name] ? ?No Known Allergies ? ?Outpatient Encounter Medications as of  02/22/2022  ?Medication Sig  ? amLODipine (NORVASC) 5 MG tablet TAKE 1 TABLET (5 MG TOTAL) BY MOUTH DAILY.  ? busPIRone (BUSPAR) 15 MG tablet TAKE 1 TABLET BY MOUTH THREE TIMES A DAY  ? clotrimazole-betamethasone (LOTRISONE) cream APPLY TO AFFECTED AREA TWICE A DAY  ? fluticasone (FLONASE) 50 MCG/ACT nasal spray Place 2 sprays into both nostrils daily. (Patient taking differently: Place 2 sprays into both nostrils daily as needed for allergies.)  ? hydrOXYzine (ATARAX/VISTARIL) 50 MG tablet TAKE ONE TABLET AT BEDTIME FOR SLEEP (Patient taking differently: Take 50 mg by mouth at bedtime as needed (sleep).)  ? INCASSIA 0.35 MG tablet TAKE 1 TABLET BY MOUTH EVERY DAY  ? megestrol (MEGACE) 40 MG tablet TAKE 2 TABLETS BY MOUTH EVERY DAY  ? meloxicam (MOBIC) 15 MG tablet Take 15 mg by mouth daily.  ? oxybutynin (DITROPAN XL) 10 MG 24 hr tablet Take 1 tablet (10 mg total) by mouth at bedtime.  ? tirzepatide (MOUNJARO) 5 MG/0.5ML Pen Inject 5 mg into the skin once a week.  ? triamterene-hydrochlorothiazide (MAXZIDE) 75-50 MG tablet TAKE 1 TABLET BY MOUTH EVERY DAY  ? venlafaxine XR (EFFEXOR-XR) 150 MG 24 hr capsule TAKE 1 CAPSULE BY MOUTH DAILY WITH BREAKFAST.  ? ?No facility-administered encounter medications on file as of 02/22/2022.  ? ? ?Patient Active Problem List  ? Diagnosis Date Noted  ? Colovesical fistula 12/08/2021  ?  Class: Question of  ? Osteoarthritis of both knees 12/03/2021  ? Controlled diabetes mellitus type 2 with complications (HCC) 09/09/2021  ? Generalized arthritis 09/09/2021  ? Snoring 07/06/2021  ? Financial difficulties 05/09/2021  ? Reduced vision 04/03/2021  ? Alternating constipation and diarrhea 12/16/2020  ? Hematochezia 09/09/2020  ? Right wrist pain  07/21/2020  ? Unsteady gait 07/14/2020  ? Poor urinary stream 05/27/2020  ? Depression, major, single episode, severe (HCC) 06/18/2019  ? Environmental allergies 06/18/2019  ? Candidiasis of skin 01/22/2019  ? Back pain 11/28/2017  ? Essential  hypertension 03/22/2016  ? Diverticulosis 11/23/2015  ? Hemorrhoids 03/26/2015  ? Insomnia due to other mental disorder 07/10/2014  ? GAD (generalized anxiety disorder) 07/10/2014  ? FHx: cancer of digestive organ 02/16/2014  ? Piles (hemorrhoids) 02/14/2014  ? Abnormal finding on Pap smear, HPV DNA positive 11/29/2013  ? Allergic rhinitis 02/12/2013  ? Prediabetes 03/26/2012  ? Dyspepsia 03/22/2012  ? TUBERCULOSIS 11/02/2010  ? Alopecia (capitis) totalis 11/02/2010  ? Chronic fatigue 07/23/2009  ? Morbid obesity (HCC) 11/24/2007  ? Depression with anxiety 11/24/2007  ? ? ?Conditions to be addressed/monitored: monitor client management of stress issues and anxiety issues ? ?Care [name] : LCSW Care [name]  ?Updates made by Jennifer Blakes, LCSW since 02/22/2022 12:00 AM  ?  ? ?Problem: Emotional Distress   ?  ? ?Goal: Emotional Health Supported.  Manage Depression issues. Manage Anxiety issues. Manage financial needs   ?Start Date: 02/22/2022  ?Expected End Date: 05/21/2022  ?This Visit's Progress: Not on track  ?Recent Progress: Not on track  ?Priority: High  ?Note:   ?Current Barriers:  ?Financial barriers ?Depression issues ?Anxiety issues ?Suicidal Ideation/Homicidal Ideation: No ? ?Clinical Social Work Goal(s):  ?patient will work with SW  in next 30 days by telephone or in person to reduce or manage symptoms related to depression or anxiety  ?Client to attend scheduled medical appointments in next 30 days ?Client to communicate with RNCM as needed for nursing support ?Client will talk with SW in next 30 days about financial needs of client ? ?Interventions: ?1:1 collaboration with [name], MD regarding development and update of comprehensive [name] of care as evidenced by provider attestation and co-signature ?Discussed client needs with Jennifer [name]. ?Discussed food needs of client. She said she receives food stamps benefit monthly ?Reviewed social support of client. She talks daily with her nephew , Jennifer [name] . Jennifer [name]  said she is now staying at home of her niece. Client home has no heating at present. Client said she talks regularly with several of her cousins. LCSW talked with client about importance of social contact and social support from others, such as family members ?Discussed financial challenges. She is concerned about not being able to pay her mortgage or car payment. She had been receiving funds from [employer] program through [employer] She called program recently and they currently do not have available funds. She is working with attorney and is applying for Disability. ?Discussed medication procurement of client. She has difficulty sometimes in paying for her medications. She said she sometimes has to borrow money from relatives to help pay for her medications ?Provided counseling support for client ?Discussed her insurance:  Jennifer [name]. She said this is a health [name] offered through the [place] [facility] [place]. ?Client said she needs to reschedule appointment with Dietician, Jennifer [name].  ?Client is hoping to eventually have surgery for weight reduction. She said she has lost some weight. She said she feels good about loosing some weight. ?Reviewed pain issues. She spoke of back pain and knee pain. She spoke of difficulty in standing. ?Reviewed mood issues. She is anxious about financial stressors. She is taking her prescribed medications.  ?Reviewed home access. She said it is difficult to go up and down steps at her home. She  uses a cane.  She wants to talk with Dr. Lodema Hong about her needs and determine if she needs other equipment, for example, a elevated toilet seat,  tub bench or tub seat, grab bars in bathroom, quad cane.  She only has one cane and is wondering if she may benefit from other equipment items to help her in her home. ? ?Patient Self Care Activities:  ?Attends medical appointments ? ?Patient Coping Strengths:  ?Has emotional support from nephew, Jennifer [name] ? ?Patient Self Care  Deficits:  ?Financial challenges ?Sleep issues ?Depression issues ?Anxiety issues ? ?Patient Goals:  ?- spend time or talk with others at least 2 to 3 times per week ?- practice relaxation or meditation dai

## 2022-02-22 NOTE — Patient Instructions (Addendum)
Visit Information ? ?Thank you for taking time to visit with me today. Please don't hesitate to contact me if I can be of assistance to you before our next scheduled telephone appointment. ? ?Following are the goals we discussed today:  ? ?Our next appointment is by telephone on 04/09/22 at 11:00 AM  ? ?Please call the care guide team at [phone] if you need to cancel or reschedule your appointment.  ? ?If you are experiencing a Mental Health or [place] or need someone to talk to, please call the [facility]: [phone]  ? ?Patient Goals: ?Manage financial needs; manage depression issues; manage anxiety issues ? ?Time Frame:  Short Term Goal ?Progress: Not On Track ?Priority:  High ? ?Start Date:  02/22/22 ?End Date:  05/21/22 ? ?Current Barriers:  ?Financial barriers ?Depression issues ?Anxiety issues ?Suicidal Ideation/Homicidal Ideation: No ? ?Clinical Social Work Goal(s):  ?patient will work with SW  in next 30 days by telephone or in person to reduce or manage symptoms related to depression or anxiety  ?Client to attend scheduled medical appointments in next 30 days ?Client to communicate with RNCM as needed for nursing support ?Client will talk with SW in next 30 days about financial needs of client ? ?Interventions: ?1:1 collaboration with [name], MD regarding development and update of comprehensive plan of care as evidenced by provider attestation and co-signature ?Discussed client needs with Marianita. ?Discussed food needs of client. She said she receives food stamps benefit monthly ?Reviewed social support of client. She talks daily with her nephew , Camillia Herter . Ilisa said she is now staying at home of her niece. Client home has no heating at present. Client said she talks regularly with several of her cousins. LCSW talked with client about importance of social contact and social support from others, such as family members ?Discussed financial challenges. She is  concerned about not being able to pay her mortgage or car payment. She had been receiving funds from [employer] program through [employer]. She called program recently and they currently do not have available funds. She is working with attorney and is applying for Disability. ?Discussed medication procurement of client. She has difficulty sometimes in paying for her medications. She said she sometimes has to borrow money from relatives to help pay for her medications ?Provided counseling support for client ?Discussed her insurance:  [employer]. She said this is a health plan offered through the [place] [facility] [place]. ?Client said she needs to reschedule appointment with Dietician, Jearld Fenton.  ?Client is hoping to eventually have surgery for weight reduction. She said she has lost some weight. She said she feels good about loosing some weight. ?Reviewed pain issues. She spoke of back pain and knee pain. She spoke of difficulty in standing. ?Reviewed mood issues. She is anxious about financial stressors. She is taking her prescribed medications.  ?Reviewed home access. She said it is difficult to go up and down steps at her home. She uses a cane.  She wants to talk with Dr. Moshe Cipro about her needs and determine if she needs other equipment, for example, a elevated toilet seat. Tub bench of tub seat, hand rails in bathroom, quad cane.  She only has one cane and is wondering if she may benefit from other equipment items to help her in her home. ? ?Patient Self Care Activities:  ?Attends medical appointments ? ?Patient Coping Strengths:  ?Has emotional support from nephew, Kynnedi Zweig ? ?Patient Self  Care Deficits:  ?Financial challenges ?Sleep issues ?Depression issues ?Anxiety issues ? ?Patient Goals:  ?- spend time or talk with others at least 2 to 3 times per week ?- practice relaxation or meditation daily ?- keep a calendar with appointment dates ? ?Follow Up Plan: LCSW to call client on 04/09/22  at 11:00 AM to assess client needs.   ? ? ?Norva Riffle.Laurie Penado MSW, LCSW ?Licensed Clinical Social Worker ?[facility] ?[phone]  ?

## 2022-02-25 ENCOUNTER — Other Ambulatory Visit: Payer: Self-pay | Admitting: Obstetrics & Gynecology

## 2022-02-25 ENCOUNTER — Other Ambulatory Visit: Payer: Self-pay | Admitting: Family Medicine

## 2022-02-25 ENCOUNTER — Encounter: Payer: Self-pay | Admitting: Orthopaedic Surgery

## 2022-02-26 NOTE — Telephone Encounter (Signed)
Please advise. I have let patient know that you are not in the office until next week. She has not been seen in the office since January, which is when Tylenol #3 was ordered. ?

## 2022-03-01 NOTE — Telephone Encounter (Signed)
Happy to send in tramadol, but we cannot do anything stronger than that or tylenol 3

## 2022-03-02 NOTE — Telephone Encounter (Signed)
I would not continue tylenol 3 if it is making her itch.  If she is unable to take these and tylenol or rx nsaids do not help, we would need to refer to pain management or pcp for something stronger

## 2022-03-04 ENCOUNTER — Ambulatory Visit: Payer: 59 | Admitting: Obstetrics & Gynecology

## 2022-03-08 ENCOUNTER — Ambulatory Visit (HOSPITAL_BASED_OUTPATIENT_CLINIC_OR_DEPARTMENT_OTHER): Payer: 59 | Admitting: Physical Therapy

## 2022-03-10 ENCOUNTER — Other Ambulatory Visit: Payer: Self-pay | Admitting: Family Medicine

## 2022-03-10 ENCOUNTER — Ambulatory Visit: Payer: 59 | Admitting: Skilled Nursing Facility1

## 2022-03-15 ENCOUNTER — Ambulatory Visit: Payer: 59 | Admitting: Obstetrics & Gynecology

## 2022-03-26 ENCOUNTER — Other Ambulatory Visit: Payer: Self-pay | Admitting: Family Medicine

## 2022-03-29 ENCOUNTER — Encounter: Payer: Self-pay | Admitting: Obstetrics & Gynecology

## 2022-03-29 ENCOUNTER — Ambulatory Visit (INDEPENDENT_AMBULATORY_CARE_PROVIDER_SITE_OTHER): Payer: 59 | Admitting: Obstetrics & Gynecology

## 2022-03-29 VITALS — BP 117/80 | HR 109 | Wt 308.6 lb

## 2022-03-29 DIAGNOSIS — N939 Abnormal uterine and vaginal bleeding, unspecified: Secondary | ICD-10-CM | POA: Diagnosis not present

## 2022-03-29 MED ORDER — MEGESTROL ACETATE 40 MG PO TABS: ORAL_TABLET | ORAL | 3 refills | Status: DC

## 2022-03-29 NOTE — Progress Notes (Signed)
? ?  GYN VISIT ?Patient name: Jennifer [name] MRN [id number]  Date of birth: 1976/08/07 ?Chief Complaint:   ?Follow-up (Started Megace in January) ? ?History of Present Illness:   ?Jennifer [name] is a 46 y.o. G80P1001  female being seen today for the following concerns: ? ?AUB: Last seen by Dr. Despina Hidden in January and started on Megace.  This has been going well for her and she currently does not have a period.  She has not had any side effects with this medication.  She is overall doing well and reports no acute complaints ? ?Prior US reviewed ? ?No LMP recorded. (Menstrual status: Irregular Periods). ? ? ?  02/05/2022  ?  9:55 AM 02/05/2022  ?  9:54 AM 12/25/2021  ? 12:44 PM 12/11/2021  ?  9:49 AM 12/03/2021  ?  9:46 AM  ?Depression screen PHQ 2/9  ?Decreased Interest 1 1 1 3 3   ?Down, Depressed, Hopeless 1 1 1 2 3   ?PHQ - 2 Score 2 2 2 5 6   ?Altered sleeping 1  1 2 3   ?Tired, decreased energy 1  2 3 3   ?Change in appetite 0  0 1 3  ?Feeling bad or failure about yourself  1  1 1 2   ?Trouble concentrating 0  1 1 2   ?Moving slowly or fidgety/restless 0  1 1 1   ?Suicidal thoughts 0  0 0 1  ?PHQ-9 Score 5  8 14 21   ?Difficult doing work/chores Very difficult  Somewhat difficult Somewhat difficult Extremely dIfficult  ? ? ? ?Review of Systems:   ?Pertinent items are noted in HPI ?Denies fever/chills, dizziness, headaches, visual disturbances, fatigue, shortness of breath, chest pain, abdominal pain, vomiting, bowel movements, urination, or intercourse unless otherwise stated above.  ?Pertinent History Reviewed:  ?Reviewed past medical,surgical, social, obstetrical and family history.  ?Reviewed problem list, medications and allergies. ?Physical Assessment:  ? ?Vitals:  ? 03/29/22 1417 03/29/22 1421  ?BP: (!) 136/94 117/80  ?Pulse: (!) 109   ?Weight: (!) 308 lb 9.6 oz (140 kg)   ?Body mass index is 48.33 kg/m?. ? ?     Physical Examination:  ? General appearance: alert, well appearing, and in no distress ? Psych: mood appropriate,  normal affect ? Skin: warm & dry  ? Cardiovascular: normal heart rate noted ? Respiratory: normal respiratory effort, no distress ? Abdomen: obese, soft, non-tender, uterus below umbilicus, no rebound, no guarding ? Pelvic: examination not indicated ? Extremities: no edema  ? ?Chaperone: N/A   ? ?Assessment & Plan:  ?1) AUB ?-doing well with Megace, plan to continue ?-briefly reviewed alternative options, declined at this time ? ?Meds ordered this encounter  ?Medications  ? megestrol (MEGACE) 40 MG tablet  ?  Sig: TAKE 2 TABLETS BY MOUTH EVERY DAY  ?  Dispense:  60 tablet  ?  Refill:  3  ? ? ? ?Return in about 1 year (around 03/30/2023) for  Annual. ? ? ?Myna Hidalgo, DO ?Attending Obstetrician & Gynecologist, Faculty Practice ?[facility] for [employer], [facility] ? ? ? ?

## 2022-04-05 ENCOUNTER — Ambulatory Visit (HOSPITAL_BASED_OUTPATIENT_CLINIC_OR_DEPARTMENT_OTHER): Payer: 59 | Attending: General Surgery | Admitting: Physical Therapy

## 2022-04-05 ENCOUNTER — Encounter (HOSPITAL_BASED_OUTPATIENT_CLINIC_OR_DEPARTMENT_OTHER): Payer: Self-pay | Admitting: Physical Therapy

## 2022-04-05 DIAGNOSIS — M25562 Pain in left knee: Secondary | ICD-10-CM | POA: Insufficient documentation

## 2022-04-05 DIAGNOSIS — M25561 Pain in right knee: Secondary | ICD-10-CM | POA: Insufficient documentation

## 2022-04-05 DIAGNOSIS — M5459 Other low back pain: Secondary | ICD-10-CM

## 2022-04-05 DIAGNOSIS — M6281 Muscle weakness (generalized): Secondary | ICD-10-CM | POA: Diagnosis not present

## 2022-04-05 DIAGNOSIS — R262 Difficulty in walking, not elsewhere classified: Secondary | ICD-10-CM

## 2022-04-05 DIAGNOSIS — G8929 Other chronic pain: Secondary | ICD-10-CM

## 2022-04-05 NOTE — Therapy (Signed)
OUTPATIENT PHYSICAL THERAPY THORACOLUMBAR EVALUATION   Patient Name: Jennifer [name] MRN: [id number] DOB:1976-09-22, 46 y.o., female Today's Date: 04/05/2022   PT End of Session - 04/05/22 1351     Visit Number 1    Number of Visits 21    Date for PT Re-Evaluation 06/18/22    Authorization Type [employer]    PT Start Time 1348    PT Stop Time 1422    PT Time Calculation (min) 34 min    Activity Tolerance Patient limited by pain    Behavior During Therapy [facility] for tasks assessed/performed             Past Medical History:  Diagnosis Date   Abnormal vaginal Pap smear    Acid reflux    Alopecia    Anxiety    Arthritis    Depression    Diabetes mellitus without complication (HCC)    prediabetic, takes no meds   Diverticulosis    Family history of diabetes mellitus    GERD (gastroesophageal reflux disease)    Hemorrhoids    History of recurrent UTIs    Hypertension    Obesity    Past Surgical History:  Procedure Laterality Date   APPENDECTOMY     COLONOSCOPY N/A 03/13/2014   Dr. Jena Gauss- grade 3 hemorrhoids, colonic diverticulosis bx= benign lymphoid polyp   COLONOSCOPY WITH PROPOFOL N/A 01/26/2021   Procedure: COLONOSCOPY WITH PROPOFOL;  Surgeon: Corbin Ade, MD;  Location: [facility];  Service: Endoscopy;  Laterality: N/A;  AM   KNEE ARTHROSCOPY     right   POLYPECTOMY  01/26/2021   Procedure: POLYPECTOMY;  Surgeon: Corbin Ade, MD;  Location: [facility];  Service: Endoscopy;;   Patient Active Problem List   Diagnosis Date Noted   Colovesical fistula 12/08/2021    Class: Question of   Osteoarthritis of both knees 12/03/2021   Controlled diabetes mellitus type 2 with complications (HCC) 09/09/2021   Generalized arthritis 09/09/2021   Snoring 07/06/2021   Financial difficulties 05/09/2021   Reduced vision 04/03/2021   Alternating constipation and diarrhea 12/16/2020   Hematochezia 09/09/2020   Right wrist pain 07/21/2020   Unsteady gait  07/14/2020   Poor urinary stream 05/27/2020   Depression, major, single episode, severe (HCC) 06/18/2019   Environmental allergies 06/18/2019   Candidiasis of skin 01/22/2019   Back pain 11/28/2017   Essential hypertension 03/22/2016   Diverticulosis 11/23/2015   Hemorrhoids 03/26/2015   Insomnia due to other mental disorder 07/10/2014   GAD (generalized anxiety disorder) 07/10/2014   FHx: cancer of digestive organ 02/16/2014   Piles (hemorrhoids) 02/14/2014   Abnormal finding on Pap smear, HPV DNA positive 11/29/2013   Allergic rhinitis 02/12/2013   Prediabetes 03/26/2012   Dyspepsia 03/22/2012   TUBERCULOSIS 11/02/2010   Alopecia (capitis) totalis 11/02/2010   Chronic fatigue 07/23/2009   Morbid obesity (HCC) 11/24/2007   Depression with anxiety 11/24/2007    PCP: Syliva Overman, MD  REFERRING PROVIDER: Gaynelle Adu, MD  REFERRING DIAG: E66.01 (ICD-10-CM) - Morbid (severe) obesity due to excess calories  Rationale for Evaluation and Treatment Rehabilitation  THERAPY DIAG:  Chronic pain of left knee  Chronic pain of right knee  Other low back pain  Difficulty in walking, not elsewhere classified  Muscle weakness (generalized)  ONSET DATE: knees 1998, back shortly after  SUBJECTIVE:  SUBJECTIVE STATEMENT: Knees started hurting a few years ago making it diffuclt to stand. I don't do a lot of walking because I feel unsteady. I don't exercise like I used to. I can feel my back when walking- before 5 min. Was told I need to lose 73lb before considering knee surgery.    PERTINENT HISTORY:  OA, obesity, h/o CA, GAD, h/o falls  PAIN:  Are you having pain? Yes: NPRS scale: 6 in sitting, 8 in standing/10 Pain location: bil knees (Lt>Rt) Pain description: sharp, ache Aggravating  factors: constant but worse with weight bearing Relieving factors: sitting PAIN:  Are you having pain? Yes: NPRS scale: 0 in sitting/10 Pain location: lower back Pain description: feels like I need to bend over Aggravating factors: walking Relieving factors: bending over    PRECAUTIONS: Fall  WEIGHT BEARING RESTRICTIONS No  FALLS:  Has patient fallen in last 6 months? No  LIVING ENVIRONMENT: Lives with: lives with their daughter Lives in: House/apartment Stairs: steps to enter home Has following equipment at home:  walking stick  OCCUPATION: not working right now  PLOF: Independent  PATIENT GOALS decrease pain, return to exercise   OBJECTIVE:   DIAGNOSTIC FINDINGS:  Rt knee xray 12/09/21: X-rays demonstrate marked tricompartmental degenerative changes with  valgus deformity.  Overall windswept deformity Lt knee xray 12/09/21:X-rays reveal significant tricompartmental degenerative changes with varus  deformity.  There is also medial translation of the femur on the tibia.    Overall windswept deformity.   PATIENT SURVEYS:  FOTO 35   COGNITION:  Overall cognitive status: Within functional limits for tasks assessed     SENSATION: WFL   POSTURE: flexed through pelvis, ant pelvic tilt, bil knee windswept to the Lt  PALPATION:  Notable cavitations in bilateral knees with MMT  LUMBAR ROM:   Able to demo trunk mobility in seated but in flexed position in standing    LOWER EXTREMITY MMT:  hand held dynamometry at eval  MMT Right eval Left eval  Hip flexion 19.6 lb 18.3  Hip extension    Hip abduction    Hip adduction    Hip internal rotation    Hip external rotation    Knee flexion    Knee extension 24.1 28.2  Ankle dorsiflexion    Ankle plantarflexion    Ankle inversion    Ankle eversion     (Blank rows = not tested)   FUNCTIONAL TESTS:  5 times sit to stand: unable to stand without use of UEs SLS   unable  GAIT: Ambulated into clinic with  antalgic pattern using walking stick    TODAY'S TREATMENT  EVAL Testing and education regarding PT   PATIENT EDUCATION:  Education details: Anatomy of condition, POC, HEP, exercise form/rationale, aquatics Person educated: Patient Education method: Programmer, multimedia, Demonstration, Tactile cues, Verbal cues, and Handouts Education comprehension: verbalized understanding, returned demonstration, verbal cues required, tactile cues required, and needs further education   HOME EXERCISE PROGRAM: TBD  ASSESSMENT:  CLINICAL IMPRESSION: Patient is a 46 y.o. F who was seen today for physical therapy evaluation and treatment for functional limitations due to morbid obesity. Bilateral windswept posture in bilateral knees significantly limits her ability to strengthen on land and will benefit greatly from aquatic therapy. She chose to utilize a WC to aquatic center today due to distance. LBP exacerbated by flexed posture in standing. She currently has a very low level of daily activity.   OBJECTIVE IMPAIRMENTS Abnormal gait, decreased activity tolerance, decreased balance, decreased endurance, decreased  mobility, difficulty walking, decreased strength, improper body mechanics, postural dysfunction, obesity, and pain.   ACTIVITY LIMITATIONS carrying, lifting, bending, standing, stairs, transfers, and locomotion level  PARTICIPATION LIMITATIONS: meal prep, cleaning, shopping, and community activity  PERSONAL FACTORS 1-2 comorbidities: h/o falls, GAD  are also affecting patient's functional outcome.   REHAB POTENTIAL: Fair    CLINICAL DECISION MAKING: Evolving/moderate complexity  EVALUATION COMPLEXITY: Moderate   GOALS: Goals reviewed with patient? Yes  SHORT TERM GOALS: Target date: 04/26/2022  Pt will find a proper working level in pool to tolerance of knees Baseline: Goal status: INITIAL   LONG TERM GOALS: Target date: 06/20/22  Able to ambulate from car to pool without rest  break Baseline:  Goal status: INITIAL  2.  FOTO to meet stated goal Baseline:  Goal status: INITIAL  3.  Gross LE strength to increase by at least 10% bilaterally via hand held dynamometry testing Baseline:  Goal status: INITIAL  4.  Pt will verbalize a notable increase in participation of ADLs and more frequent mobility Baseline:  Goal status: INITIAL    PLAN: PT FREQUENCY: 2x/week  PT DURATION: 10 weeks  PLANNED INTERVENTIONS: Therapeutic exercises, Therapeutic activity, Neuromuscular re-education, Balance training, Gait training, Patient/Family education, Joint mobilization, Aquatic Therapy, Dry Needling, Electrical stimulation, Spinal mobilization, Cryotherapy, Moist heat, Taping, Manual therapy, and Re-evaluation.  PLAN FOR NEXT SESSION: begin aquatics   Jeancarlos Marchena C. Alyce Inscore PT, DPT 04/05/22 8:02 PM

## 2022-04-09 ENCOUNTER — Telehealth: Payer: 59 | Admitting: Licensed Clinical Social Worker

## 2022-04-09 ENCOUNTER — Telehealth: Payer: Self-pay | Admitting: Licensed Clinical Social Worker

## 2022-04-09 NOTE — Telephone Encounter (Addendum)
Care Management Clinical Social Work Note   04/09/22 Name: Jennifer [name]         MRN: [id number]       DOB: [birth year]   Jennifer [name] is a 46 y.o. year old female who is a primary care patient of [name], MD.  The Care Management team was consulted for assistance with chronic disease management and coordination needs.   LCSW called client phone number 2 times today but LCSW was not able to speak via phone with client. Answering machine for client was full; thus, LCSW could not leave client a phone message  Follow Up Plan:  [place] scheduler to re-schedule  LCSW phone visit with client  Norva Riffle.Vedansh Kerstetter MSW, [place] [employer] representative [facility] Care Management [phone]

## 2022-04-13 ENCOUNTER — Ambulatory Visit (HOSPITAL_COMMUNITY): Payer: 59

## 2022-04-13 ENCOUNTER — Inpatient Hospital Stay (HOSPITAL_COMMUNITY): Admission: RE | Admit: 2022-04-13 | Payer: 59 | Source: Ambulatory Visit

## 2022-04-16 ENCOUNTER — Ambulatory Visit (HOSPITAL_BASED_OUTPATIENT_CLINIC_OR_DEPARTMENT_OTHER): Payer: 59 | Admitting: Physical Therapy

## 2022-04-23 ENCOUNTER — Ambulatory Visit: Payer: 59 | Admitting: Family Medicine

## 2022-04-26 ENCOUNTER — Ambulatory Visit (HOSPITAL_COMMUNITY)
Admission: RE | Admit: 2022-04-26 | Discharge: 2022-04-26 | Disposition: A | Discharge status: Home / Self Care | Payer: 59 | Source: Ambulatory Visit | Attending: General Surgery | Admitting: General Surgery

## 2022-04-26 DIAGNOSIS — Z01818 Encounter for other preprocedural examination: Secondary | ICD-10-CM | POA: Diagnosis not present

## 2022-04-26 IMAGING — CR DG CHEST 2V
2 series · 2 of 2 positions shown · non-contrast
Comparison: [DATE]

CLINICAL DATA: Severe a be [place].  Preoperative study.

EXAM:
CHEST - 2 VIEW

[w chest pa]
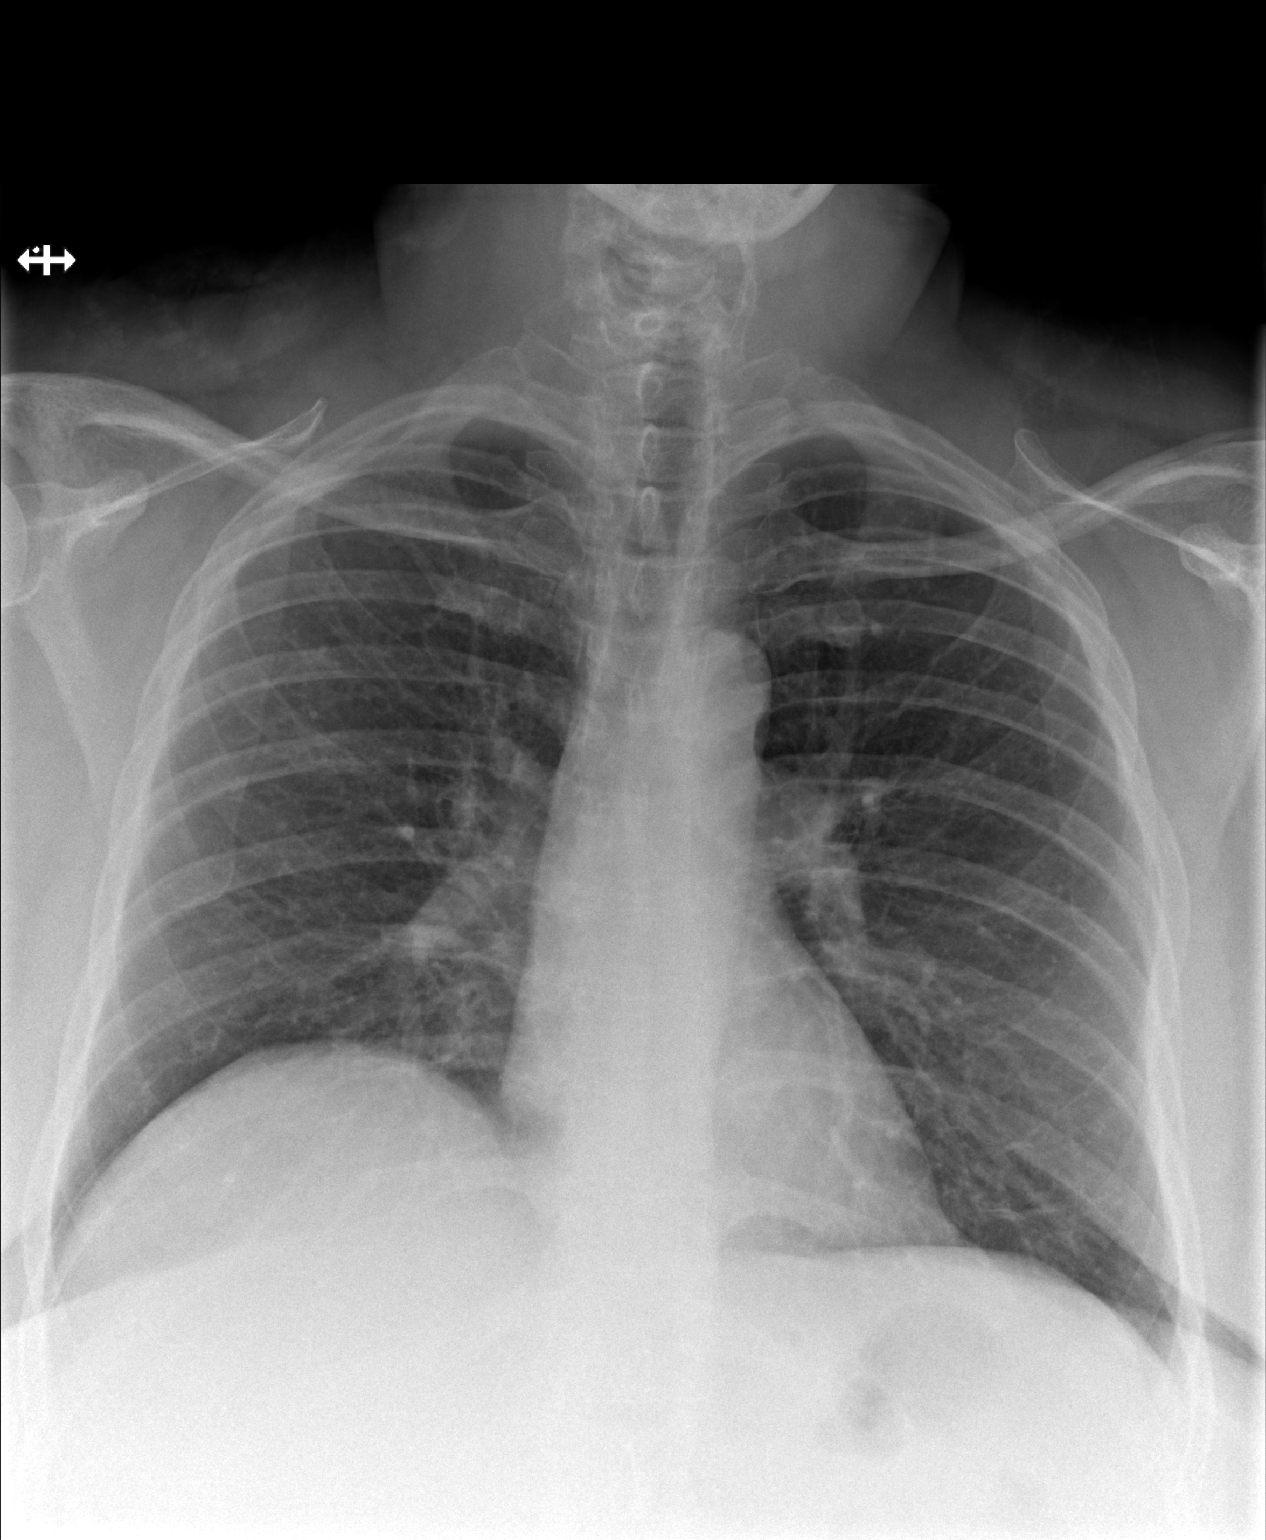

[w chest lat]
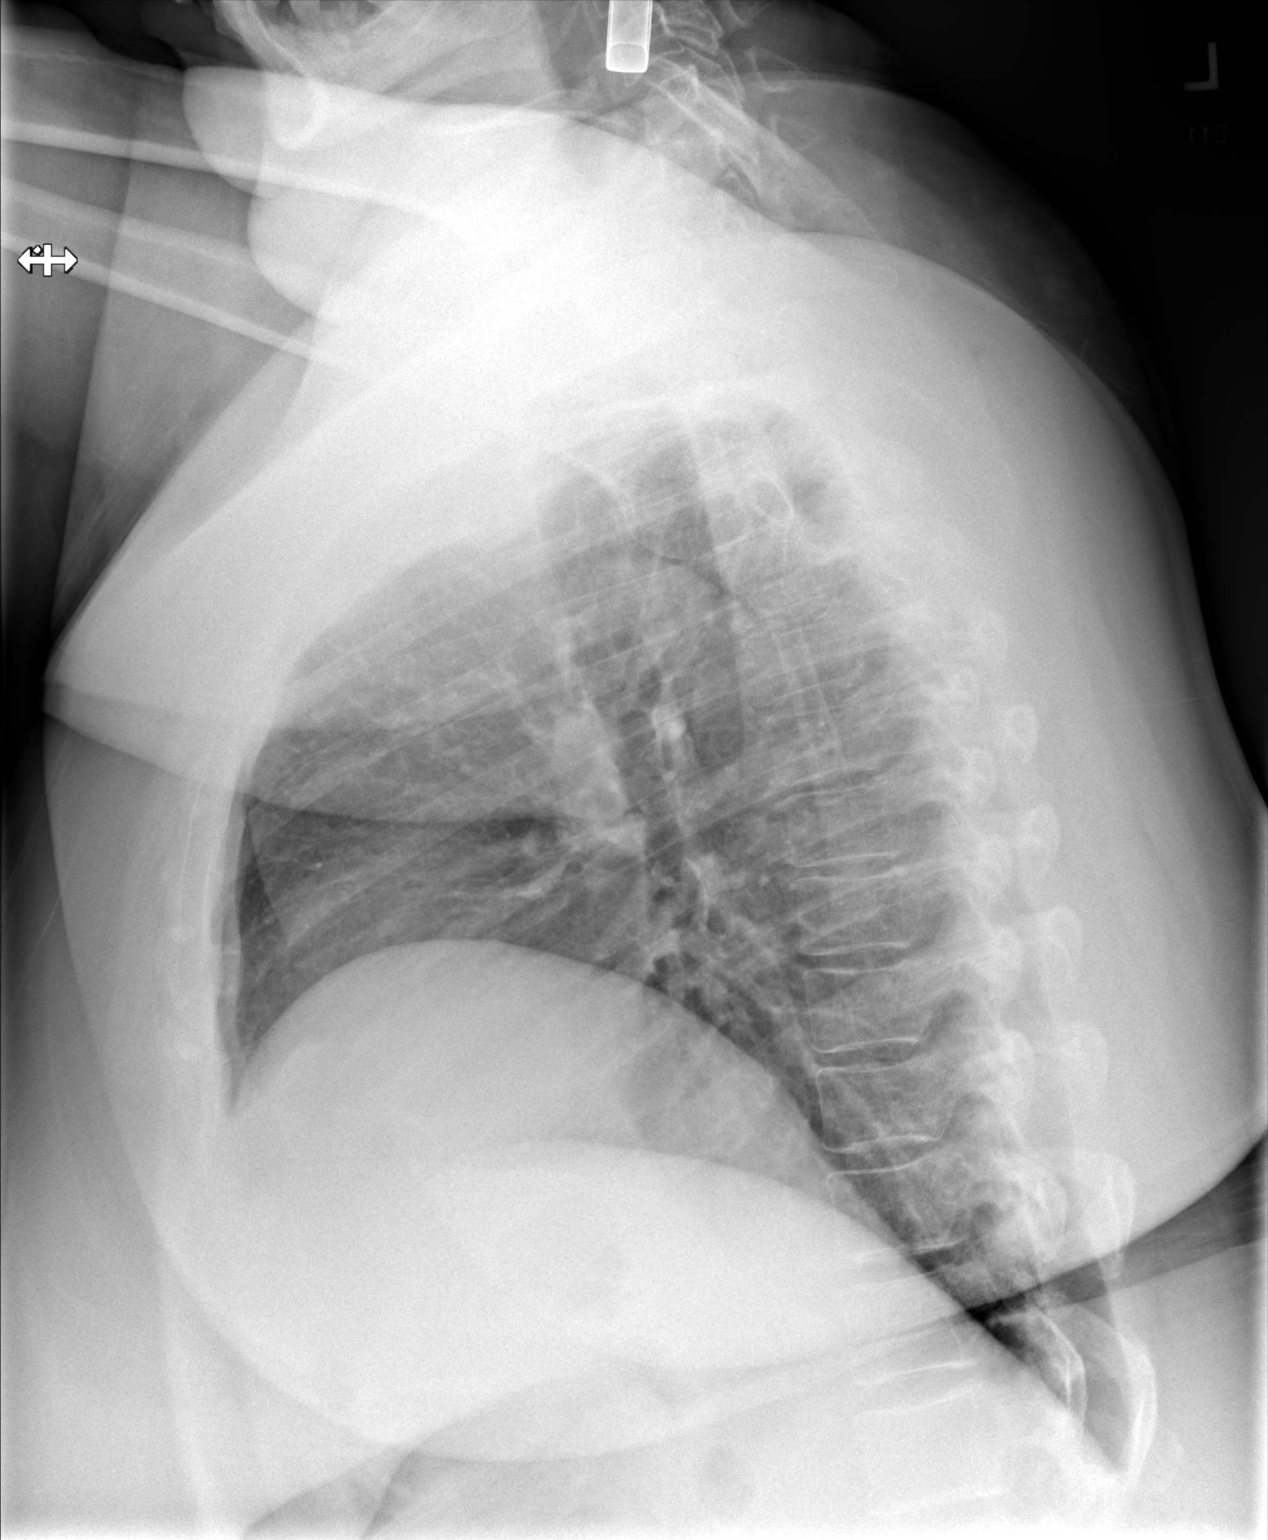

[2 of 2 positions shown; findings below may reference images not displayed]

FINDINGS: The heart size and mediastinal contours are within normal limits.
Both lungs are clear. The visualized skeletal structures are
unremarkable.
IMPRESSION: No active cardiopulmonary disease.

## 2022-04-26 IMAGING — RF DG UGI W/ HIGH DENSITY W/O KUB
12 of 16 series · 15 of 22 positions shown · non-contrast
Comparison: None Available.

CLINICAL DATA: Severe be [place].  Preop for gastric bypass.

EXAM:
UPPER GI SERIES WITH KUB
TECHNIQUE: After obtaining a scout radiograph a routine upper GI series was
performed using thin density barium.
FLUOROSCOPY:
Radiation Exposure Index (as provided by the fluoroscopic device):
71.2 mGy. MGy Kerma

[Series 3: t abdomen · 1 of 1 slices shown]
[im 1/1]
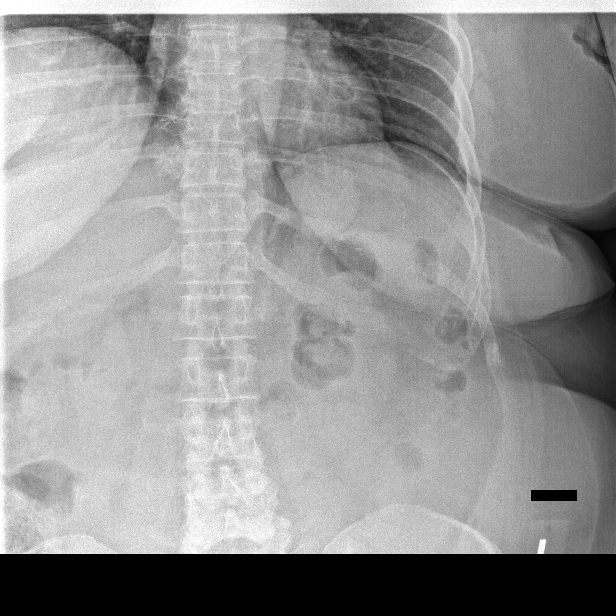

[Series 4: cp_standard · 2 of 146 frames shown (1 of 2)]
[frame 74/146]
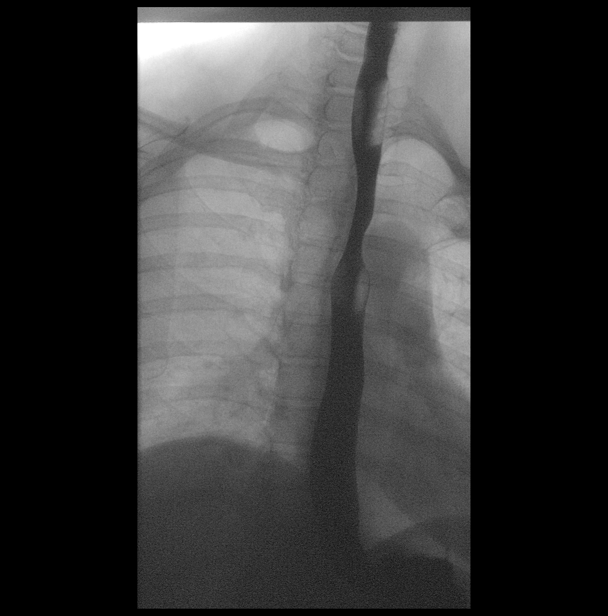
[frame 125/146]
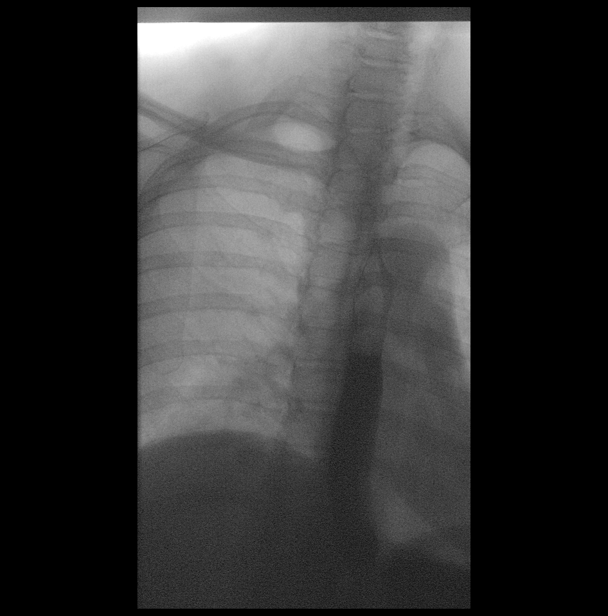

[Series 5: cp_standard · 3 of 261 frames shown (2 of 2)]
[frame 27/261]
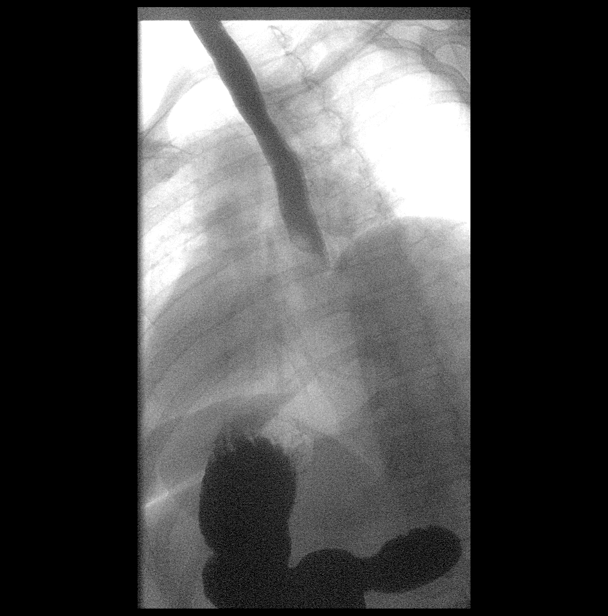
[frame 40/261]
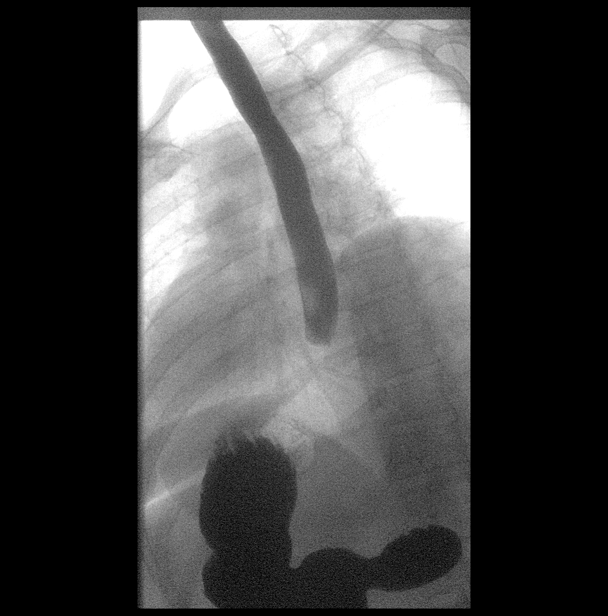
[frame 222/261]
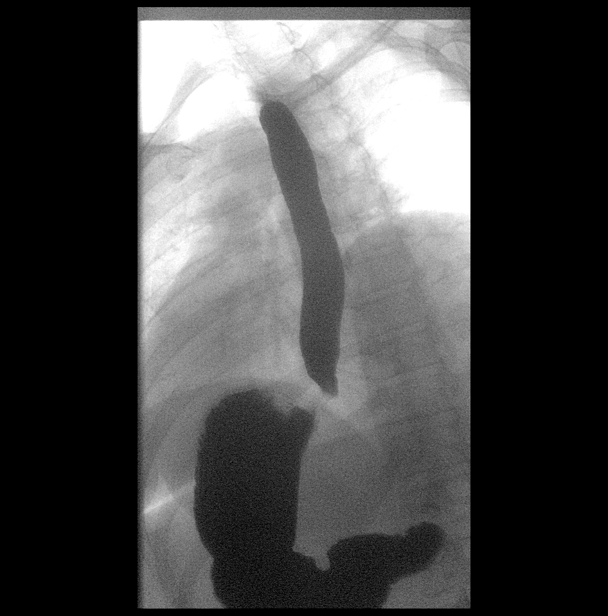

[Series 6: fluoro_barium singleshot_bw · 1 of 1 slices shown (1 of 9)]
[im 1/1]
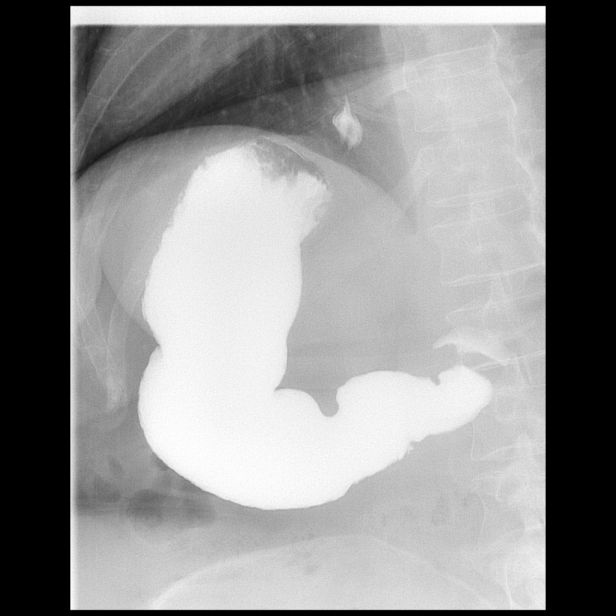

[Series 8: fluoro_barium singleshot_bw · 1 of 1 slices shown (2 of 9)]
[im 1/1]
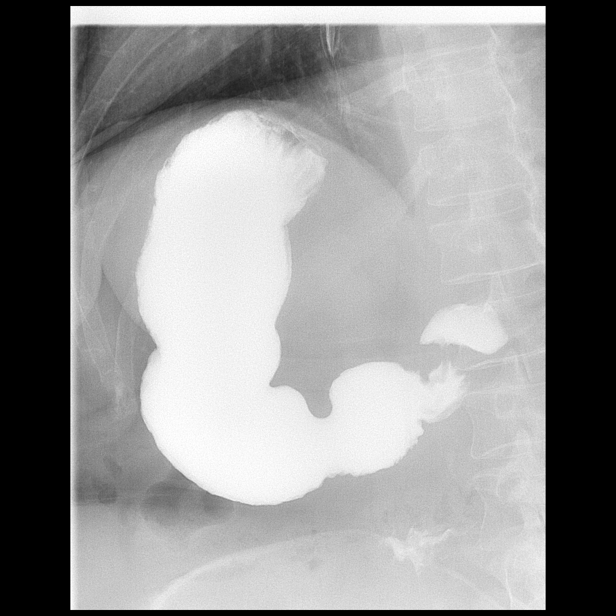

[Series 9: fluoro_barium singleshot_bw · 1 of 1 slices shown (3 of 9)]
[im 1/1]
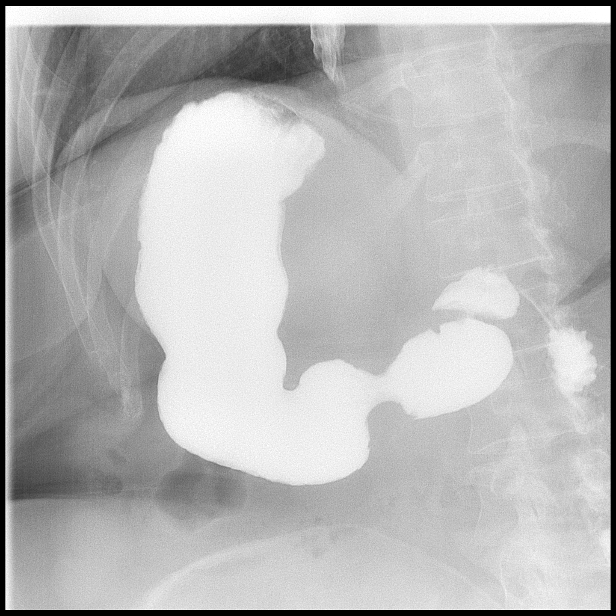

[Series 10: fluoro_barium singleshot_bw · 1 of 1 slices shown (4 of 9)]
[im 1/1]
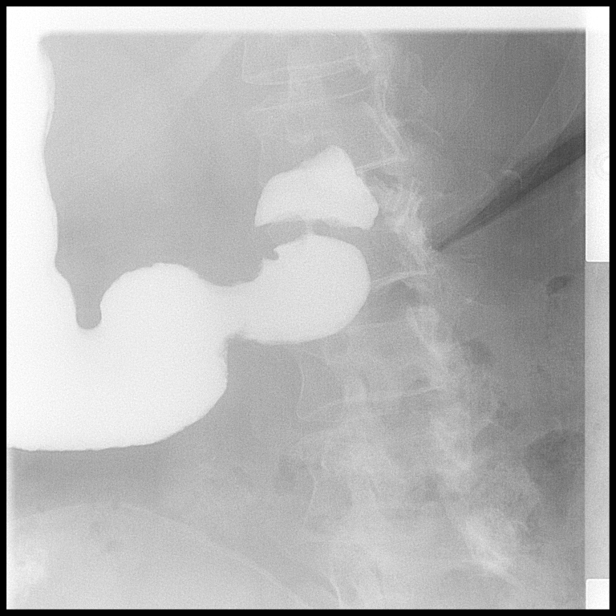

[Series 12: fluoro_barium singleshot_bw · 1 of 1 slices shown (5 of 9)]
[im 1/1]
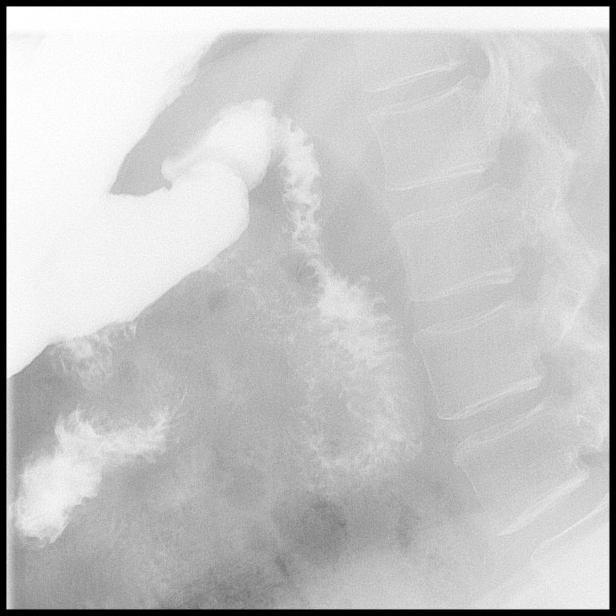

[Series 13: fluoro_barium singleshot_bw · 1 of 1 slices shown (6 of 9)]
[im 1/1]
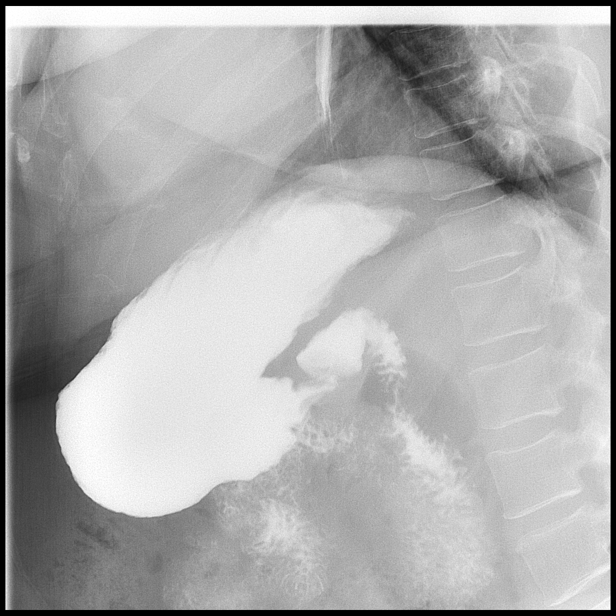

[Series 15: fluoro_barium singleshot_bw · 1 of 1 slices shown (7 of 9)]
[im 1/1]
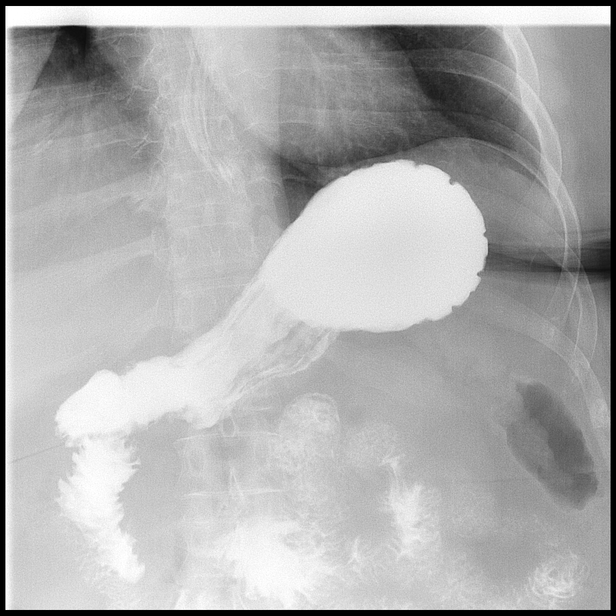

[Series 16: fluoro_barium singleshot_bw · 1 of 1 slices shown (8 of 9)]
[im 1/1]
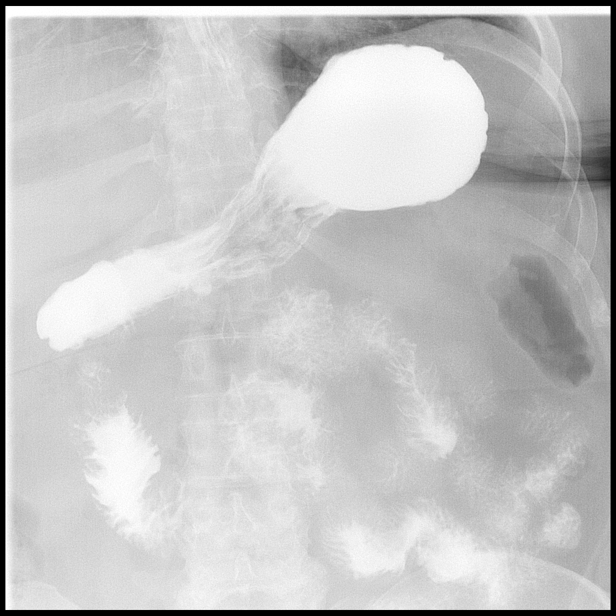

[Series 18: fluoro_barium singleshot_bw · 1 of 1 slices shown (9 of 9)]
[im 1/1]
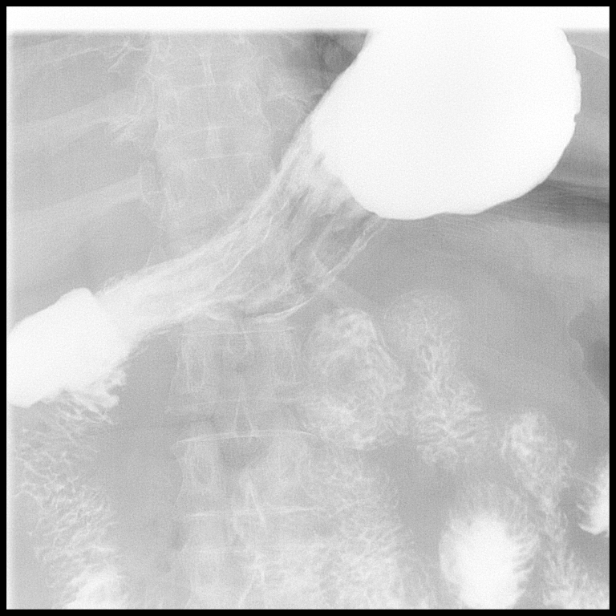

[15 of 22 positions shown; findings below may reference images not displayed]

FINDINGS: No mass or stricture is noted in the esophagus. No hiatal hernia or
reflux is noted. The stomach is unremarkable. Contrast flowed easily
from the stomach into the duodenum. Duodenal bulb and sweep are
unremarkable.
IMPRESSION: No definite abnormality seen in the esophagus, stomach or duodenum.

## 2022-04-27 ENCOUNTER — Ambulatory Visit (HOSPITAL_COMMUNITY)
Admission: RE | Admit: 2022-04-27 | Discharge: 2022-04-27 | Disposition: A | Discharge status: Home / Self Care | Payer: 59 | Source: Ambulatory Visit | Attending: Family Medicine | Admitting: Family Medicine

## 2022-04-27 ENCOUNTER — Encounter (HOSPITAL_COMMUNITY): Payer: Self-pay

## 2022-04-27 DIAGNOSIS — R921 Mammographic calcification found on diagnostic imaging of breast: Secondary | ICD-10-CM | POA: Insufficient documentation

## 2022-04-27 DIAGNOSIS — Z029 Encounter for administrative examinations, unspecified: Secondary | ICD-10-CM

## 2022-04-27 IMAGING — MG MM DIGITAL DIAGNOSTIC UNILAT*L* W/ TOMO W/ CAD
6 series · 6 of 14 positions shown · non-contrast
Comparison: Previous exam(s).

ACR Breast Density Category a: The breast tissue is almost entirely
fatty.

CLINICAL DATA: First six-month follow-up for probably benign
calcifications in the LEFT breast.

EXAM:
DIGITAL DIAGNOSTIC UNILATERAL LEFT MAMMOGRAM WITH TOMOSYNTHESIS AND
CAD
TECHNIQUE: Left digital diagnostic mammography and breast tomosynthesis was
performed. The images were evaluated with computer-aided detection.

[L ML]
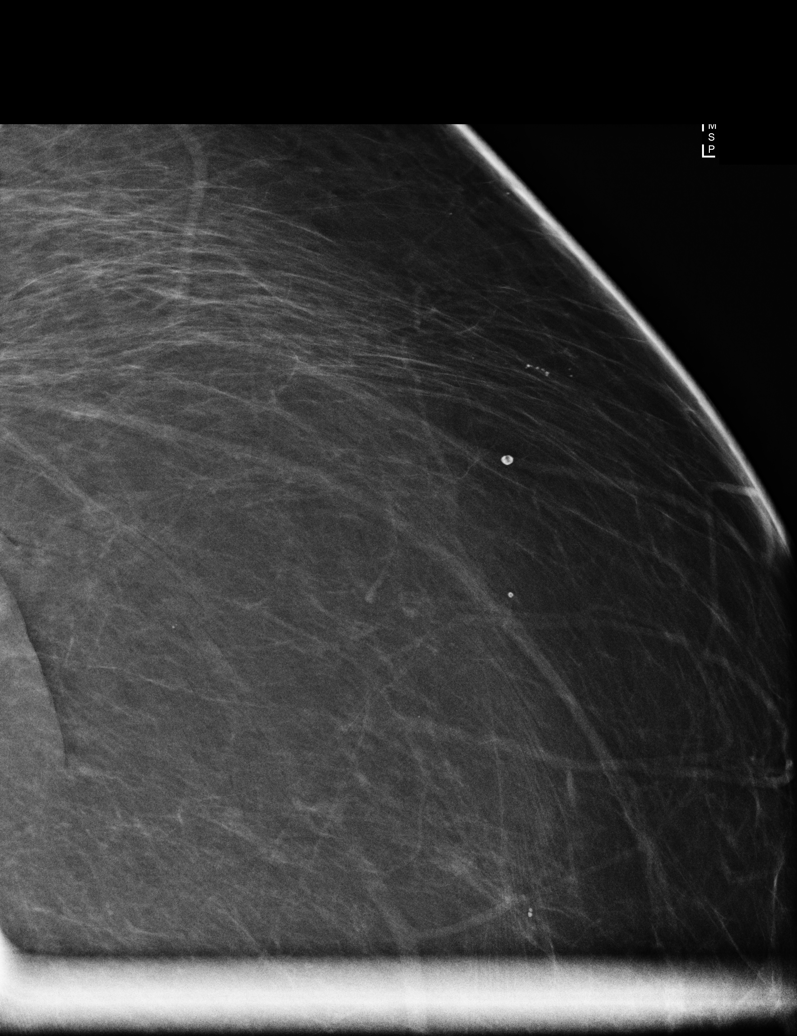

[L CC]
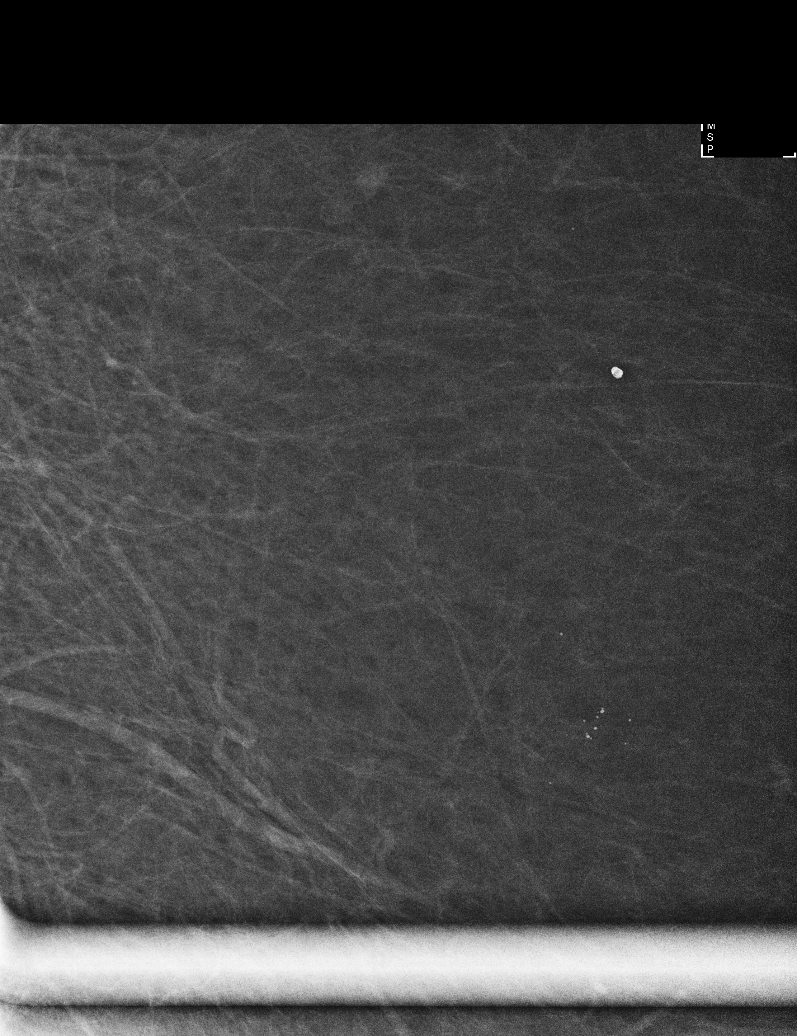

[L MLO synth-2D]
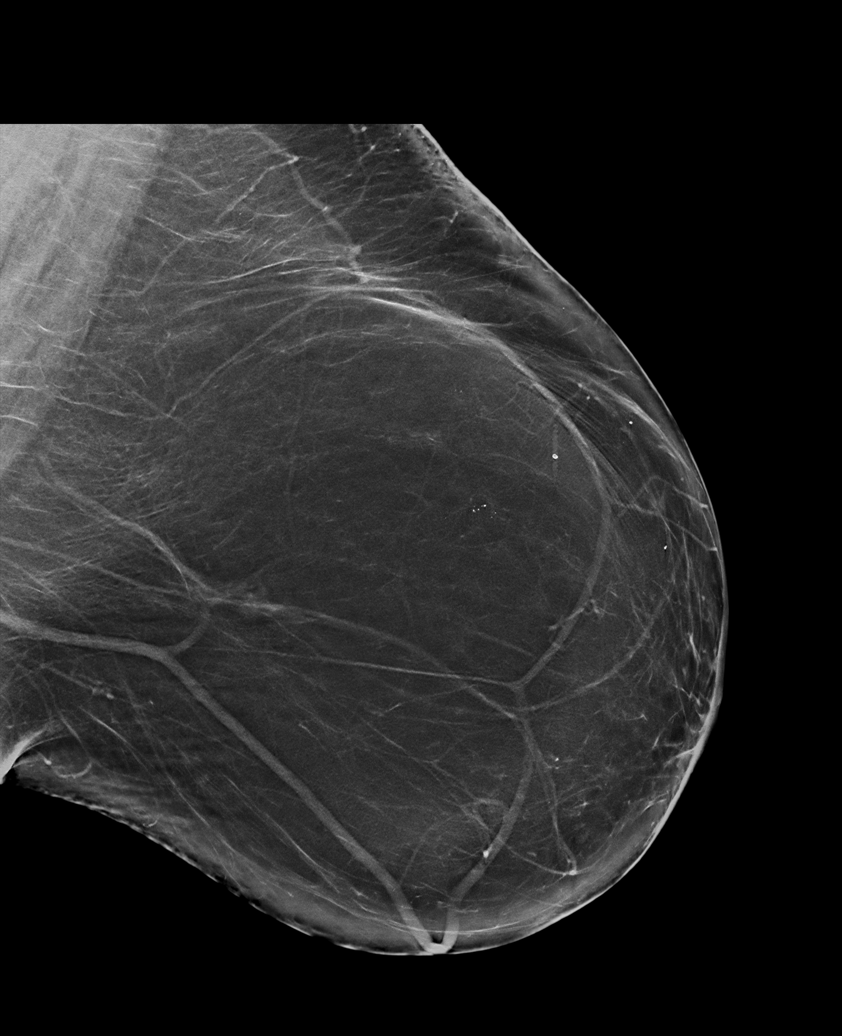

[L CC synth-2D]
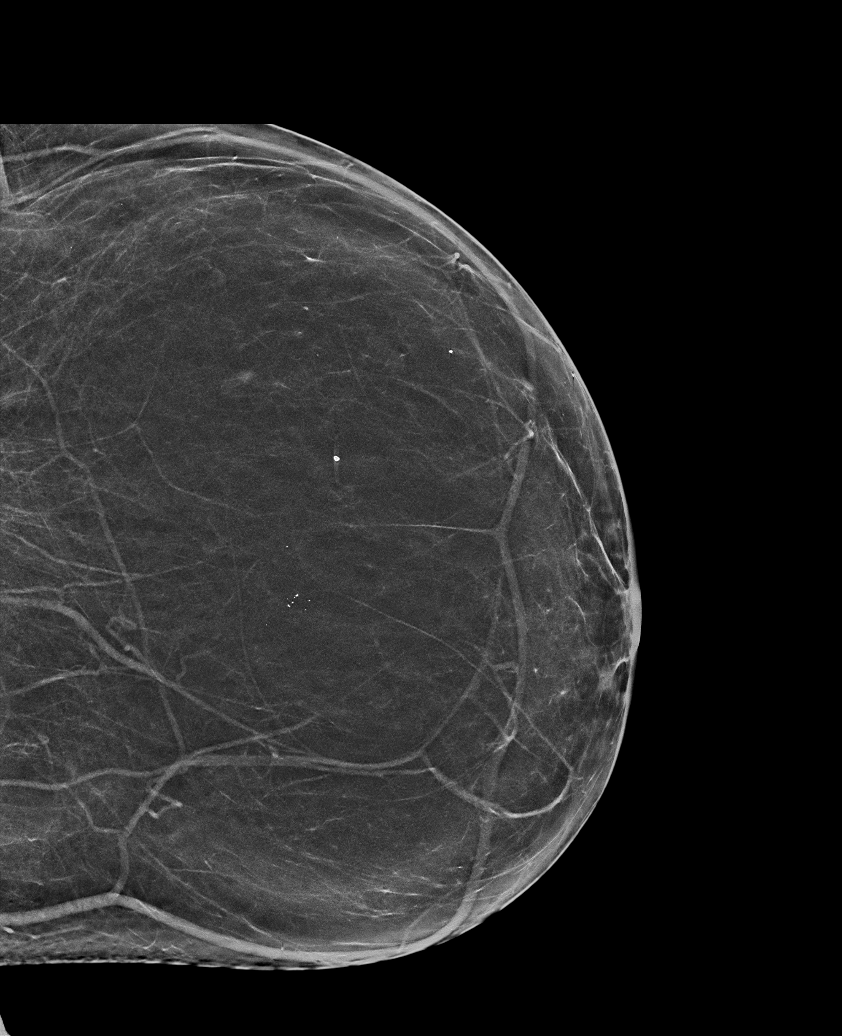

[L CC tomo · tomo slice 35/70.0]
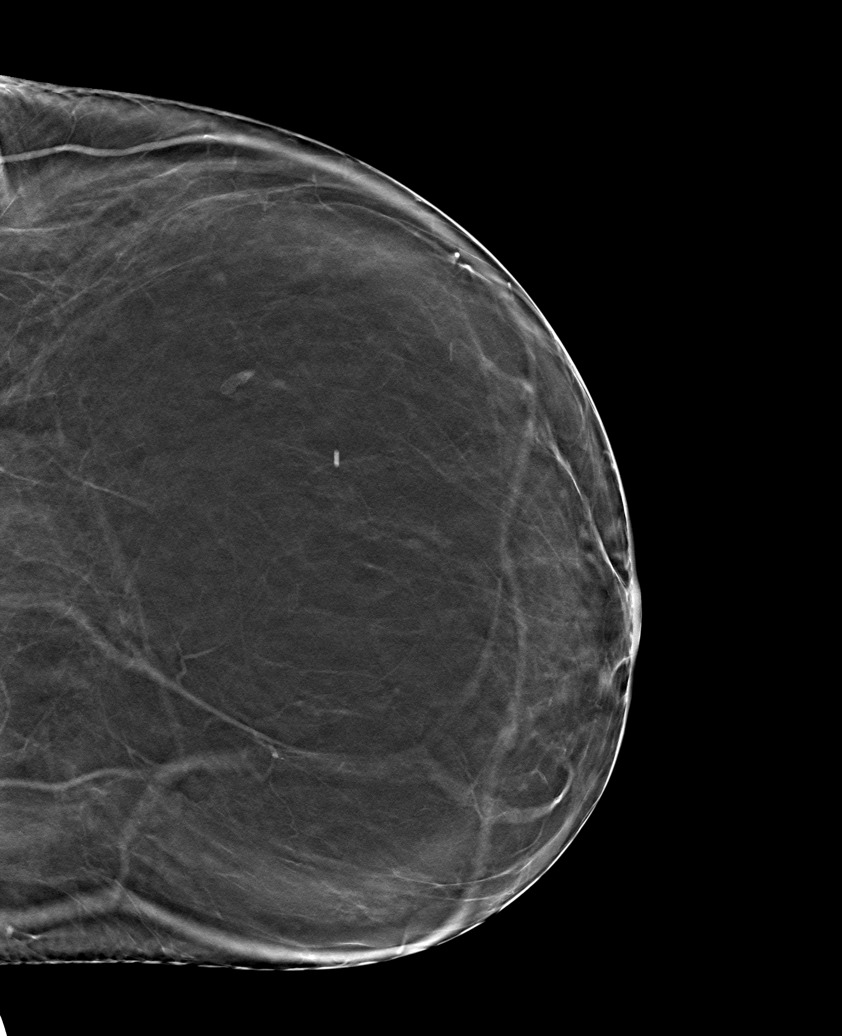

[L MLO tomo · tomo slice 45/88.0]
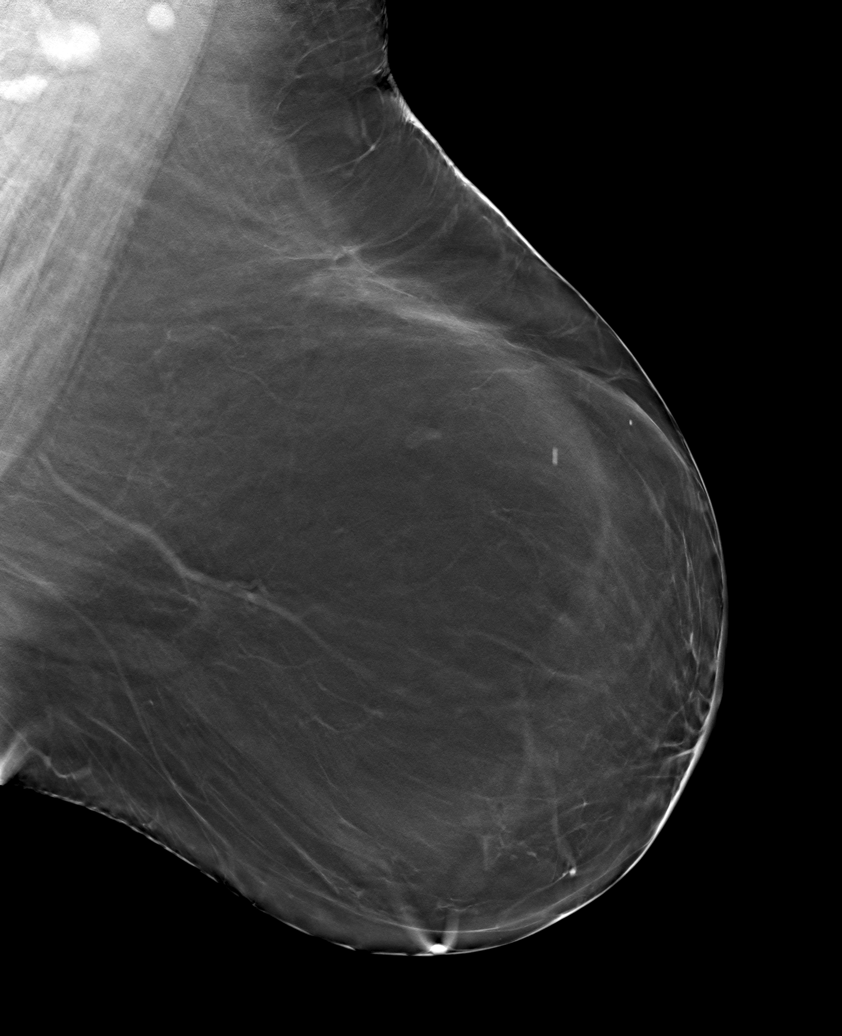

[6 of 14 positions shown; findings below may reference images not displayed]

FINDINGS: Magnified views are performed of calcifications in the UPPER central
LEFT breast. These views demonstrate round calcifications without
suspicious morphology or distribution. No new or suspicious findings
in the LEFT breast.
IMPRESSION: No mammographic evidence for malignancy. Stable, probably benign
LEFT breast calcifications.

RECOMMENDATION:
Recommend bilateral diagnostic mammogram in [DATE].

I have discussed the findings and recommendations with the patient.
If applicable, a reminder letter will be sent to the patient
regarding the next appointment.

BI-RADS CATEGORY  3: Probably benign.

## 2022-04-30 ENCOUNTER — Ambulatory Visit (HOSPITAL_BASED_OUTPATIENT_CLINIC_OR_DEPARTMENT_OTHER): Payer: 59 | Attending: General Surgery | Admitting: Physical Therapy

## 2022-04-30 ENCOUNTER — Encounter: Payer: Self-pay | Admitting: Dietician

## 2022-04-30 ENCOUNTER — Encounter: Payer: Self-pay | Admitting: Family Medicine

## 2022-04-30 ENCOUNTER — Other Ambulatory Visit: Payer: Self-pay | Admitting: *Deleted

## 2022-04-30 ENCOUNTER — Ambulatory Visit (INDEPENDENT_AMBULATORY_CARE_PROVIDER_SITE_OTHER): Payer: 59 | Admitting: Family Medicine

## 2022-04-30 ENCOUNTER — Encounter (HOSPITAL_BASED_OUTPATIENT_CLINIC_OR_DEPARTMENT_OTHER): Payer: Self-pay | Admitting: Physical Therapy

## 2022-04-30 ENCOUNTER — Encounter: Payer: 59 | Attending: General Surgery | Admitting: Dietician

## 2022-04-30 VITALS — BP 150/90 | HR 98 | Resp 16 | Ht 67.0 in | Wt 310.0 lb

## 2022-04-30 DIAGNOSIS — M6281 Muscle weakness (generalized): Secondary | ICD-10-CM | POA: Diagnosis present

## 2022-04-30 DIAGNOSIS — M5459 Other low back pain: Secondary | ICD-10-CM | POA: Diagnosis present

## 2022-04-30 DIAGNOSIS — G8929 Other chronic pain: Secondary | ICD-10-CM | POA: Diagnosis present

## 2022-04-30 DIAGNOSIS — I10 Essential (primary) hypertension: Secondary | ICD-10-CM

## 2022-04-30 DIAGNOSIS — R921 Mammographic calcification found on diagnostic imaging of breast: Secondary | ICD-10-CM

## 2022-04-30 DIAGNOSIS — R7303 Prediabetes: Secondary | ICD-10-CM

## 2022-04-30 DIAGNOSIS — R262 Difficulty in walking, not elsewhere classified: Secondary | ICD-10-CM | POA: Diagnosis present

## 2022-04-30 DIAGNOSIS — F322 Major depressive disorder, single episode, severe without psychotic features: Secondary | ICD-10-CM | POA: Diagnosis not present

## 2022-04-30 DIAGNOSIS — M25562 Pain in left knee: Secondary | ICD-10-CM | POA: Insufficient documentation

## 2022-04-30 DIAGNOSIS — M25561 Pain in right knee: Secondary | ICD-10-CM | POA: Diagnosis present

## 2022-04-30 MED ORDER — TIRZEPATIDE 7.5 MG/0.5ML ~~LOC~~ SOAJ: 7.5000 mg | SUBCUTANEOUS | 1 refills | Status: DC

## 2022-04-30 MED ORDER — ACYCLOVIR 800 MG PO TABS: 800.0000 mg | ORAL_TABLET | Freq: Three times a day (TID) | ORAL | 1 refills | Status: DC

## 2022-04-30 NOTE — Therapy (Signed)
OUTPATIENT PHYSICAL THERAPY THORACOLUMBAR    Patient Name: Jennifer [name] MRN: [id number] DOB:06-29-1976, 46 y.o., female Today's Date: 04/30/2022   PT End of Session - 04/30/22 1038     Number of Visits 21    Date for PT Re-Evaluation 06/18/22    Authorization Type [employer]    PT Start Time 1035    PT Stop Time 1115    PT Time Calculation (min) 40 min    Activity Tolerance Patient limited by pain    Behavior During Therapy [facility] for tasks assessed/performed             Past Medical History:  Diagnosis Date   Abnormal vaginal Pap smear    Acid reflux    Alopecia    Anxiety    Arthritis    Depression    Diabetes mellitus without complication (HCC)    prediabetic, takes no meds   Diverticulosis    Family history of diabetes mellitus    GERD (gastroesophageal reflux disease)    Hemorrhoids    History of recurrent UTIs    Hypertension    Obesity    Past Surgical History:  Procedure Laterality Date   APPENDECTOMY     COLONOSCOPY N/A 03/13/2014   Dr. Jena Gauss- grade 3 hemorrhoids, colonic diverticulosis bx= benign lymphoid polyp   COLONOSCOPY WITH PROPOFOL N/A 01/26/2021   Procedure: COLONOSCOPY WITH PROPOFOL;  Surgeon: Corbin Ade, MD;  Location: [facility];  Service: Endoscopy;  Laterality: N/A;  AM   KNEE ARTHROSCOPY     right   POLYPECTOMY  01/26/2021   Procedure: POLYPECTOMY;  Surgeon: Corbin Ade, MD;  Location: [facility];  Service: Endoscopy;;   Patient Active Problem List   Diagnosis Date Noted   Colovesical fistula 12/08/2021    Class: Question of   Osteoarthritis of both knees 12/03/2021   Controlled diabetes mellitus type 2 with complications (HCC) 09/09/2021   Generalized arthritis 09/09/2021   Snoring 07/06/2021   Financial difficulties 05/09/2021   Reduced vision 04/03/2021   Alternating constipation and diarrhea 12/16/2020   Hematochezia 09/09/2020   Right wrist pain 07/21/2020   Unsteady gait 07/14/2020   Poor urinary  stream 05/27/2020   Depression, major, single episode, severe (HCC) 06/18/2019   Environmental allergies 06/18/2019   Candidiasis of skin 01/22/2019   Back pain 11/28/2017   Essential hypertension 03/22/2016   Diverticulosis 11/23/2015   Hemorrhoids 03/26/2015   Insomnia due to other mental disorder 07/10/2014   GAD (generalized anxiety disorder) 07/10/2014   FHx: cancer of digestive organ 02/16/2014   Piles (hemorrhoids) 02/14/2014   Abnormal finding on Pap smear, HPV DNA positive 11/29/2013   Allergic rhinitis 02/12/2013   Prediabetes 03/26/2012   Dyspepsia 03/22/2012   TUBERCULOSIS 11/02/2010   Alopecia (capitis) totalis 11/02/2010   Chronic fatigue 07/23/2009   Morbid obesity (HCC) 11/24/2007   Depression with anxiety 11/24/2007    PCP: Syliva Overman, MD  REFERRING PROVIDER: Gaynelle Adu, MD  REFERRING DIAG: E66.01 (ICD-10-CM) - Morbid (severe) obesity due to excess calories  Rationale for Evaluation and Treatment Rehabilitation  THERAPY DIAG:  Chronic pain of left knee  Chronic pain of right knee  Other low back pain  Difficulty in walking, not elsewhere classified  Muscle weakness (generalized)  ONSET DATE: knees 1998, back shortly after  SUBJECTIVE:  SUBJECTIVE STATEMENT: Knees started hurting a few years ago making it diffuclt to stand. I don't do a lot of walking because I feel unsteady. I don't exercise like I used to. I can feel my back when walking- before 5 min. Was told I need to lose 73lb before considering knee surgery.    PERTINENT HISTORY:  OA, obesity, h/o CA, GAD, h/o falls  PAIN:  Are you having pain? Yes: NPRS scale: 6 in sitting, 8 in standing/10 Pain location: bil knees (Lt>Rt) Pain description: sharp, ache Aggravating factors: constant but worse with  weight bearing Relieving factors: sitting PAIN:  Are you having pain? Yes: NPRS scale: 0 in sitting/10 Pain location: lower back Pain description: feels like I need to bend over Aggravating factors: walking Relieving factors: bending over    PRECAUTIONS: Fall  WEIGHT BEARING RESTRICTIONS No  FALLS:  Has patient fallen in last 6 months? No  LIVING ENVIRONMENT: Lives with: lives with their daughter Lives in: House/apartment Stairs: steps to enter home Has following equipment at home:  walking stick  OCCUPATION: not working right now  PLOF: Independent  PATIENT GOALS decrease pain, return to exercise   OBJECTIVE:   DIAGNOSTIC FINDINGS:  Rt knee xray 12/09/21: X-rays demonstrate marked tricompartmental degenerative changes with  valgus deformity.  Overall windswept deformity Lt knee xray 12/09/21:X-rays reveal significant tricompartmental degenerative changes with varus  deformity.  There is also medial translation of the femur on the tibia.    Overall windswept deformity.   PATIENT SURVEYS:  FOTO 35   COGNITION:  Overall cognitive status: Within functional limits for tasks assessed     SENSATION: WFL   POSTURE: flexed through pelvis, ant pelvic tilt, bil knee windswept to the Lt  PALPATION:  Notable cavitations in bilateral knees with MMT  LUMBAR ROM:   Able to demo trunk mobility in seated but in flexed position in standing    LOWER EXTREMITY MMT:  hand held dynamometry at eval  MMT Right eval Left eval  Hip flexion 19.6 lb 18.3  Hip extension    Hip abduction    Hip adduction    Hip internal rotation    Hip external rotation    Knee flexion    Knee extension 24.1 28.2  Ankle dorsiflexion    Ankle plantarflexion    Ankle inversion    Ankle eversion     (Blank rows = not tested)   FUNCTIONAL TESTS:  5 times sit to stand: unable to stand without use of UEs SLS   unable  GAIT: Ambulated into clinic with antalgic pattern using walking  stick    TODAY'S TREATMENT  Pt seen for aquatic therapy today.  Treatment took place in water 3.25-4.8 ft in depth at the [facility] pool. Temp of water was 91.  Pt entered/exited the pool via stairs step to pattern independently with bilat rail.  Intro to setting Walking with yellow noodle all depths forward, back and sidestepping multiple widths Straddling noodle: cycling, skiing and scissor. Verbal and tactile cues for execution and positioning, gaining and maintaining balance Seated: LAQ R/L 2x15; SLR (flutter kicking) 3x20; add/abd 3x20.   Pt requires buoyancy for support and to offload joints with strengthening exercises. Viscosity of the water is needed for resistance of strengthening; water current perturbations provides challenge to standing balance unsupported, requiring increased core activation.    PATIENT EDUCATION:  Education details: Teacher, music of condition, POC, HEP, exercise form/rationale, aquatics Person educated: Patient Education method: Explanation, Demonstration, Tactile cues, Verbal cues, and  Handouts Education comprehension: verbalized understanding, returned demonstration, verbal cues required, tactile cues required, and needs further education   HOME EXERCISE PROGRAM: TBD  ASSESSMENT:  CLINICAL IMPRESSION: Pt demonstrates safety and indep in pool. Knee pain bilaterally significant upon arrival 10/10. She is apprehensive upon initiation but completes without difficulty.  All activities completed with cues for slowing pace to encourage movement in available ranges allowing for improved circulation and pain reduction using properties of water. She tolerates LE movement best in 4.33ft. Pt reports L knee pain >right. Observation:Right valgus deformity >L. Upon completion pt reports reduction in pain 4 points to 6/10.  She is a good candidate for aquatic therapy using the properties of water to facilitate and hasten progress towards goals.  Patient is a 46  y.o. F who was seen today for physical therapy evaluation and treatment for functional limitations due to morbid obesity. Bilateral windswept posture in bilateral knees significantly limits her ability to strengthen on land and will benefit greatly from aquatic therapy. She chose to utilize a WC to aquatic center today due to distance. LBP exacerbated by flexed posture in standing. She currently has a very low level of daily activity.   OBJECTIVE IMPAIRMENTS Abnormal gait, decreased activity tolerance, decreased balance, decreased endurance, decreased mobility, difficulty walking, decreased strength, improper body mechanics, postural dysfunction, obesity, and pain.   ACTIVITY LIMITATIONS carrying, lifting, bending, standing, stairs, transfers, and locomotion level  PARTICIPATION LIMITATIONS: meal prep, cleaning, shopping, and community activity  PERSONAL FACTORS 1-2 comorbidities: h/o falls, GAD  are also affecting patient's functional outcome.   REHAB POTENTIAL: Fair    CLINICAL DECISION MAKING: Evolving/moderate complexity  EVALUATION COMPLEXITY: Moderate   GOALS: Goals reviewed with patient? Yes  SHORT TERM GOALS: Target date: 04/26/2022  Pt will find a proper working level in pool to tolerance of knees Baseline: Goal status: INITIAL   LONG TERM GOALS: Target date: 06/20/22  Able to ambulate from car to pool without rest break Baseline:  Goal status: INITIAL  2.  FOTO to meet stated goal Baseline:  Goal status: INITIAL  3.  Gross LE strength to increase by at least 10% bilaterally via hand held dynamometry testing Baseline:  Goal status: INITIAL  4.  Pt will verbalize a notable increase in participation of ADLs and more frequent mobility Baseline:  Goal status: INITIAL    PLAN: PT FREQUENCY: 2x/week  PT DURATION: 10 weeks  PLANNED INTERVENTIONS: Therapeutic exercises, Therapeutic activity, Neuromuscular re-education, Balance training, Gait training, Patient/Family  education, Joint mobilization, Aquatic Therapy, Dry Needling, Electrical stimulation, Spinal mobilization, Cryotherapy, Moist heat, Taping, Manual therapy, and Re-evaluation.  PLAN FOR NEXT SESSION: begin aquatics   Corrie Dandy [phone]) Trevar Boehringer MPT 04/30/22 10:41 AM

## 2022-04-30 NOTE — Progress Notes (Signed)
Nutrition Assessment for Bariatric Surgery Medical Nutrition Therapy Appt Start Time: 8:45    End Time: 9:51  Patient was seen on 04/30/2022 for Pre-Operative Nutrition Assessment. Letter of approval faxed to [facility] bariatric surgery program coordinator on 04/30/2022.   Referral stated Supervised Weight Loss (SWL) visits needed: 6  Planned surgery: RYGB Pt expectation of surgery: to be comfortable Pt expectation of dietitian: some help    NUTRITION ASSESSMENT   Anthropometrics  Start weight at [facility]: 310.2 lbs (date: 04/30/2022)  Height: 67 in BMI: 48.57 kg/m2     Clinical  Medical hx: Anxiety, arthritis, depression, GERD, HTN Medications: Megestrol, Mounjaro, buspirone oxybutynin, triamterene, venlafaxine XR  Labs: 12/03/2021: A1C 6.2 Notable signs/symptoms: none noted Any previous deficiencies? No  Micronutrient Nutrition Focused Physical Exam: Hair: No issues observed Eyes: No issues observed Mouth: No issues observed Neck: No issues observed Nails: No issues observed Skin: No issues observed  Lifestyle & Dietary Hx  Pt arrived using a cane. Pt states she goes to physical therapy for her knees, stating she has water therapy today. Pt states she is trying to get knee replacement after she loses some weight from by-pass surgery, stating she is ready for surgery. Pt states she sometimes eats when she gets stressed out. Pt states her first goals are to cut out the [facility] and Sprite, sugary drinks.  24-Hr Dietary Recall First Meal: biscuit (sausage) or banana or grapes Snack:  Second Meal: skips or spinach salad or fish filet ([employer]) Snack: beef jerkey Third Meal: chicken pot pie or chicken and green beans Snack: triscuits  Beverages: water, HiC, Sprite    NUTRITION DIAGNOSIS  Overweight/obesity ([place]-3.3) related to past poor dietary habits and physical inactivity as evidenced by patient w/ planned RYGB surgery following dietary guidelines for  continued weight loss.    NUTRITION INTERVENTION  Nutrition counseling (C-1) and education (E-2) to facilitate bariatric surgery goals.  Educated pt on micronutrient deficiencies post surgery and strategies to mitigate that risk   Pre-Op Goals Reviewed with the Patient Track food and beverage intake (pen and paper, MyFitness Pal, Baritastic app, etc.) Make healthy food choices while monitoring portion sizes Consume 3 meals per day or try to eat every 3-5 hours Avoid concentrated sugars and fried foods Keep sugar & fat in the single digits per serving on food labels Practice CHEWING your food (aim for applesauce consistency) Practice not drinking 15 minutes before, during, and 30 minutes after each meal and snack Avoid all carbonated beverages (ex: soda, sparkling beverages)  Limit caffeinated beverages (ex: coffee, tea, energy drinks) Avoid all sugar-sweetened beverages (ex: regular soda, sports drinks)  Avoid alcohol  Aim for 64-100 ounces of FLUID daily (with at least half of fluid intake being plain water)  Aim for at least 60-80 grams of PROTEIN daily Look for a liquid protein source that contains ?15 g protein and ?5 g carbohydrate (ex: shakes, drinks, shots) Make a list of non-food related activities Physical activity is an important part of a healthy lifestyle so keep it moving! The goal is to reach 150 minutes of exercise per week, including cardiovascular and weight baring activity.  *Goals that are bolded indicate the pt would like to start working towards these  Handouts Provided Include  Bariatric Surgery handouts (Nutrition Visits, Pre-Op Goals, Protein Shakes, Vitamins & Minerals)  Learning Style & Readiness for Change Teaching method utilized: Visual & Auditory  Demonstrated degree of understanding via: Teach Back  Readiness Level: Preparation Barriers to learning/adherence to lifestyle  change: mobility  RD's Notes for Next Visit Progress toward patients chosen  goals     MONITORING & EVALUATION Dietary intake, weekly physical activity, body weight, and pre-op goals reached at next nutrition visit.    Next Steps  Patient is to follow up at [place] for Pre-Op Class >2 weeks before surgery for further nutrition education.

## 2022-04-30 NOTE — Patient Instructions (Signed)
F/u in 8 weeks, call if you need me sooner  Higher dose mounjaro starting next week  Acyclovir is prescribed  Need to take your blood pressure medications every day  It is important that you exercise regularly at least 30 minutes 5 times a week. If you develop chest pain, have severe difficulty breathing, or feel very tired, stop exercising immediately and seek medical attention  Think about what you will eat, plan ahead. Choose " clean, green, fresh or frozen" over canned, processed or packaged foods which are more sugary, salty and fatty. 70 to 75% of food eaten should be vegetables and fruit. Three meals at set times with snacks allowed between meals, but they must be fruit or vegetables. Aim to eat over a 12 hour period , example 7 am to 7 pm, and STOP after  your last meal of the day. Drink water,generally about 64 ounces per day, no other drink is as healthy. Fruit juice is best enjoyed in a healthy way, by EATING the fruit.   Thanks for choosing [facility], we consider it a privelige to serve you.

## 2022-05-03 ENCOUNTER — Ambulatory Visit (HOSPITAL_BASED_OUTPATIENT_CLINIC_OR_DEPARTMENT_OTHER): Payer: 59 | Admitting: Physical Therapy

## 2022-05-06 ENCOUNTER — Encounter: Payer: Self-pay | Admitting: Obstetrics & Gynecology

## 2022-05-07 ENCOUNTER — Ambulatory Visit: Payer: 59 | Admitting: Licensed Clinical Social Worker

## 2022-05-07 DIAGNOSIS — E118 Type 2 diabetes mellitus with unspecified complications: Secondary | ICD-10-CM

## 2022-05-07 DIAGNOSIS — F5105 Insomnia due to other mental disorder: Secondary | ICD-10-CM

## 2022-05-07 DIAGNOSIS — I10 Essential (primary) hypertension: Secondary | ICD-10-CM

## 2022-05-07 DIAGNOSIS — Z599 Problem related to housing and economic circumstances, unspecified: Secondary | ICD-10-CM

## 2022-05-07 DIAGNOSIS — E559 Vitamin D deficiency, unspecified: Secondary | ICD-10-CM

## 2022-05-07 DIAGNOSIS — F411 Generalized anxiety disorder: Secondary | ICD-10-CM

## 2022-05-07 DIAGNOSIS — F322 Major depressive disorder, single episode, severe without psychotic features: Secondary | ICD-10-CM

## 2022-05-07 DIAGNOSIS — M170 Bilateral primary osteoarthritis of knee: Secondary | ICD-10-CM

## 2022-05-08 ENCOUNTER — Encounter: Payer: Self-pay | Admitting: Emergency Medicine

## 2022-05-08 ENCOUNTER — Ambulatory Visit
Admission: EM | Admit: 2022-05-08 | Discharge: 2022-05-08 | Disposition: A | Discharge status: Home / Self Care | Payer: 59 | Attending: Family Medicine | Admitting: Family Medicine

## 2022-05-08 ENCOUNTER — Encounter: Payer: Self-pay | Admitting: Pulmonary Disease

## 2022-05-08 DIAGNOSIS — R221 Localized swelling, mass and lump, neck: Secondary | ICD-10-CM

## 2022-05-08 DIAGNOSIS — R0982 Postnasal drip: Secondary | ICD-10-CM

## 2022-05-08 DIAGNOSIS — J029 Acute pharyngitis, unspecified: Secondary | ICD-10-CM

## 2022-05-08 MED ORDER — FLUCONAZOLE 150 MG PO TABS
150.0000 mg | ORAL_TABLET | Freq: Once | ORAL | 0 refills | Status: AC
Start: 2022-05-08 — End: 2022-05-08

## 2022-05-08 MED ORDER — PROMETHAZINE-DM 6.25-15 MG/5ML PO SYRP: 5.0000 mL | ORAL_SOLUTION | Freq: Three times a day (TID) | ORAL | 0 refills | Status: DC | PRN

## 2022-05-08 MED ORDER — AMOXICILLIN 875 MG PO TABS: 875.0000 mg | ORAL_TABLET | Freq: Two times a day (BID) | ORAL | 0 refills | Status: DC

## 2022-05-08 NOTE — ED Provider Notes (Signed)
Renaldo Fiddler    CSN: [id number] Arrival date & time: 05/08/22  1445      History   Chief Complaint No chief complaint on file.   HPI Jennifer [name] is a 46 y.o. female.   HPI Patient reports today awakening with a sensation of throat filled with mucus she attempted to make herself gag and was concerned when she noticed a of"meat" protruding from the back of her throat.  She has also had some nasal congestion.  She reports feeling that she was almost choking upon awakening this morning.  She is concerned that the piece of "meat" that she is upper treating her back her throat is abnormal. Past Medical History:  Diagnosis Date   Abnormal vaginal Pap smear    Acid reflux    Alopecia    Anxiety    Arthritis    Depression    Diabetes mellitus without complication (HCC)    prediabetic, takes no meds   Diverticulosis    Family history of diabetes mellitus    GERD (gastroesophageal reflux disease)    Hemorrhoids    History of recurrent UTIs    Hypertension    Obesity     Patient Active Problem List   Diagnosis Date Noted   Colovesical fistula 12/08/2021    Class: Question of   Osteoarthritis of both knees 12/03/2021   Controlled diabetes mellitus type 2 with complications (HCC) 09/09/2021   Generalized arthritis 09/09/2021   Snoring 07/06/2021   Financial difficulties 05/09/2021   Reduced vision 04/03/2021   Alternating constipation and diarrhea 12/16/2020   Hematochezia 09/09/2020   Right wrist pain 07/21/2020   Unsteady gait 07/14/2020   Poor urinary stream 05/27/2020   Depression, major, single episode, severe (HCC) 06/18/2019   Environmental allergies 06/18/2019   Candidiasis of skin 01/22/2019   Back pain 11/28/2017   Essential hypertension 03/22/2016   Diverticulosis 11/23/2015   Hemorrhoids 03/26/2015   Insomnia due to other mental disorder 07/10/2014   GAD (generalized anxiety disorder) 07/10/2014   FHx: cancer of digestive organ 02/16/2014    Piles (hemorrhoids) 02/14/2014   Abnormal finding on Pap smear, HPV DNA positive 11/29/2013   Allergic rhinitis 02/12/2013   Prediabetes 03/26/2012   Dyspepsia 03/22/2012   TUBERCULOSIS 11/02/2010   Alopecia (capitis) totalis 11/02/2010   Chronic fatigue 07/23/2009   Morbid obesity (HCC) 11/24/2007   Depression with anxiety 11/24/2007    Past Surgical History:  Procedure Laterality Date   APPENDECTOMY     COLONOSCOPY N/A 03/13/2014   Dr. Jena Gauss- grade 3 hemorrhoids, colonic diverticulosis bx= benign lymphoid polyp   COLONOSCOPY WITH PROPOFOL N/A 01/26/2021   Procedure: COLONOSCOPY WITH PROPOFOL;  Surgeon: Corbin Ade, MD;  [facility];  Service: Endoscopy;  Laterality: N/A;  AM   KNEE ARTHROSCOPY     right   POLYPECTOMY  01/26/2021   Procedure: POLYPECTOMY;  Surgeon: Corbin Ade, MD;  Location: [facility];  Service: Endoscopy;;    OB History     Gravida  1   Para  1   Term  1   Preterm      AB      Living  1      SAB      IAB      Ectopic      Multiple      Live Births  1            Home Medications    Prior to Admission medications  Medication Sig Start Date End Date Taking? Authorizing Provider  amoxicillin (AMOXIL) 875 MG tablet Take 1 tablet (875 mg total) by mouth 2 (two) times daily. 05/08/22  Yes Bing Neighbors, FNP  fluconazole (DIFLUCAN) 150 MG tablet Take 1 tablet (150 mg total) by mouth once for 1 dose. Repeat if needed 05/08/22 05/08/22 Yes Bing Neighbors, FNP  promethazine-dextromethorphan (PROMETHAZINE-DM) 6.25-15 MG/5ML syrup Take 5 mLs by mouth 3 (three) times daily as needed for cough. 05/08/22  Yes Bing Neighbors, FNP  Acetaminophen-Codeine 300-30 MG tablet Take 1 tablet by mouth every 8 (eight) hours as needed. 03/19/22   [provider]  acyclovir (ZOVIRAX) 800 MG tablet Take 1 tablet (800 mg total) by mouth 3 (three) times daily. 04/30/22   [name], MD  amLODipine (NORVASC) 5 MG tablet  TAKE 1 TABLET (5 MG TOTAL) BY MOUTH DAILY. 12/09/21   [name], MD  busPIRone (BUSPAR) 15 MG tablet TAKE 1 TABLET BY MOUTH THREE TIMES A DAY 03/10/22   [name], MD  fluticasone [facility]) 50 MCG/ACT nasal spray Place 2 sprays into both nostrils daily. Patient taking differently: Place 2 sprays into both nostrils daily as needed for allergies. 02/06/18   [name], MD  hydrOXYzine (ATARAX/VISTARIL) 50 MG tablet TAKE ONE TABLET AT BEDTIME FOR SLEEP Patient taking differently: Take 50 mg by mouth at bedtime as needed (sleep). 09/09/20   [name], MD  megestrol (MEGACE) 40 MG tablet TAKE 2 TABLETS BY MOUTH EVERY DAY 03/29/22   Myna Hidalgo, DO  meloxicam (MOBIC) 15 MG tablet Take 15 mg by mouth daily. 10/13/20   [provider]  oxybutynin (DITROPAN-XL) 10 MG 24 hr tablet TAKE 1 TABLET BY MOUTH EVERYDAY AT BEDTIME 02/26/22   [name], MD  tirzepatide [facility]) 7.5 MG/0.5ML Pen Inject 7.5 mg into the skin once a week. 04/30/22   [name], MD  triamterene-hydrochlorothiazide (MAXZIDE) 75-50 MG tablet TAKE 1 TABLET BY MOUTH EVERY DAY 01/11/22   [name], MD  venlafaxine XR (EFFEXOR-XR) 150 MG 24 hr capsule TAKE 1 CAPSULE BY MOUTH DAILY WITH BREAKFAST. 11/05/21   [name], MD    Family History Family History  Adopted: Yes  Problem Relation Age of Onset   Hypertension Mother    Diabetes Mother    Other Mother        cholangiocarcinoma, age 62, deceased   Breast cancer Maternal Aunt    Colon cancer Neg Hx    Tuberous sclerosis Neg Hx    Alpha-1 antitrypsin deficiency Neg Hx     Social History Social History   Tobacco Use   Smoking status: Never   Smokeless tobacco: Never  Vaping Use   Vaping Use: Never used  Substance Use Topics   Alcohol use: No   Drug use: No     Allergies   Patient has no known allergies.   Review of Systems Review of Systems   Physical Exam Triage Vital Signs ED  Triage Vitals  Enc Vitals Group     BP 05/08/22 1516 115/81     Pulse Rate 05/08/22 1516 (!) 108     Resp 05/08/22 1516 18     Temp 05/08/22 1516 99 F (37.2 C)     Temp Source 05/08/22 1516 Oral     SpO2 05/08/22 1516 96 %     Weight --      Height --      Head Circumference --  Peak Flow --      Pain Score 05/08/22 1515 0     Pain Loc --      Pain Edu? --      Excl. in GC? --    No data found.  Updated Vital Signs BP 115/81 (BP Location: Right Arm)   Pulse (!) 108   Temp 99 F (37.2 C) (Oral)   Resp 18   SpO2 96%   Visual Acuity Right Eye Distance:   Left Eye Distance:   Bilateral Distance:    Right Eye Near:   Left Eye Near:    Bilateral Near:     Physical Exam Constitutional:      Appearance: She is obese.  HENT:     Head: Normocephalic and atraumatic.     Right Ear: Tympanic membrane, ear canal and external ear normal.     Left Ear: Tympanic membrane, ear canal and external ear normal.     Nose: Congestion present.  Eyes:     Extraocular Movements: Extraocular movements intact.     Pupils: Pupils are equal, round, and reactive to light.  Cardiovascular:     Rate and Rhythm: Regular rhythm. Tachycardia present.  Pulmonary:     Effort: Pulmonary effort is normal.     Breath sounds: Normal breath sounds.  Lymphadenopathy:     Cervical: Cervical adenopathy present.  Skin:    Capillary Refill: Capillary refill takes less than 2 seconds.  Neurological:     Mental Status: She is alert. Mental status is at baseline.  Psychiatric:        Mood and Affect: Affect is flat.      UC Treatments / Results  Labs (all labs ordered are listed, but only abnormal results are displayed) Labs Reviewed - No data to display  EKG   Radiology No results found.  Procedures Procedures (including critical care time)  Medications Ordered in UC Medications - No data to display  Initial Impression / Assessment and Plan / UC Course  I have reviewed the triage  vital signs and the nursing notes.  Pertinent labs & imaging results that were available during my care of the patient were reviewed by me and considered in my medical decision making (see chart for details).    Patient has significant enlargement of her tonsils and upper uvula treating for suspected strep infection versus tonsillitis.  Patient also has some significant nasal congestion which may be exacerbating her symptoms therefore I have prescribed Promethazine DM for management of the cough and nasal symptoms.  Patient advised to follow-up with her primary care provider if symptoms worsen or do not readily improve.  Patient advised to discontinue efforts to make herself gag and stuffing her hand down her throat as this can cause some significant issues to her oral pharyngeal region. Final Clinical Impressions(s) / UC Diagnoses   Final diagnoses:  Post-nasal drainage  Acute pharyngitis, unspecified etiology  Swollen uvula     Discharge Instructions      Your throat and the piece of tissue you are referring to in the back your throat is your uvula all are very swollen I suspect she may have either tonsillitis or strep throat.  I am covering you with amoxicillin twice daily for 10 days.  I have also prescribed you Promethazine DM you can take that for cough, and nasal congestion.  I have also prescribed you Diflucan which is to prevent yeast infection.   ED Prescriptions     Medication Sig Dispense  Auth. Provider   promethazine-dextromethorphan (PROMETHAZINE-DM) 6.25-15 MG/5ML syrup Take 5 mLs by mouth 3 (three) times daily as needed for cough. 180 mL Bing Neighbors, FNP   amoxicillin (AMOXIL) 875 MG tablet Take 1 tablet (875 mg total) by mouth 2 (two) times daily. 20 tablet Bing Neighbors, FNP   fluconazole (DIFLUCAN) 150 MG tablet Take 1 tablet (150 mg total) by mouth once for 1 dose. Repeat if needed 2 tablet Bing Neighbors, FNP      PDMP not reviewed this encounter.    Bing Neighbors, FNP 05/08/22 [phone]

## 2022-05-10 ENCOUNTER — Encounter: Payer: Self-pay | Admitting: Family Medicine

## 2022-05-10 NOTE — Progress Notes (Signed)
Jennifer [name]     MRN: [id number]      DOB: Jul 09, 1976   HPI [name] is here for follow up and re-evaluation of chronic medical conditions, medication management and review of any available recent lab and radiology data.  Preventive health is updated, specifically  Cancer screening and Immunization.   Questions or concerns regarding consultations or procedures which the PT has had in the interim are  addressed.currently involved in aquatherapy hich she enjoys, and is in the process of getting approval for bariatric surgery The PT denies any adverse reactions to current medications since the last visit.  There are no new concerns.  There are no specific complaints   ROS Denies recent fever or chills. Denies sinus pressure, nasal congestion, ear pain or sore throat. Denies chest congestion, productive cough or wheezing. Denies chest pains, palpitations and leg swelling Denies abdominal pain, nausea, vomiting,diarrhea or constipation.   Denies dysuria, frequency, hesitancy or incontinence. Chronic  joint pain, swelling and limitation in mobility. Denies headaches, seizures, numbness, or tingling. Denies  uncontrolled depression, anxiety or insomnia. Denies skin break down or rash.   PE  BP (!) 150/90   Pulse 98   Resp 16   Ht 5\' 7"  (1.702 m)   Wt (!) 310 lb (140.6 kg)   SpO2 95%   BMI 48.55 kg/m   Patient alert and oriented and in no cardiopulmonary distress.  HEENT: No facial asymmetry, EOMI,     Neck supple .  Chest: Clear to auscultation bilaterally.  CVS: S1, S2 no murmurs, no S3.Regular rate.  ABD: Soft non tender.   Ext: No edema  MS: decreased  ROM spine, shoulders, hips and knees.  Skin: Intact, no ulcerations or rash noted.  Psych: Good eye contact, normal affect. Memory intact not anxious or depressed appearing.  CNS: CN 2-12 intact, power,  normal throughout.no focal deficits noted.   Assessment & Plan  Essential hypertension Uncontrolled,  not takin med daily as prescribed DASH diet and commitment to daily physical activity for a minimum of 30 minutes discussed and encouraged, as a part of hypertension management. The importance of attaining a healthy weight is also discussed.     05/08/2022    3:16 PM 04/30/2022    2:48 PM 04/30/2022    2:29 PM 04/30/2022    8:53 AM 03/29/2022    2:21 PM 03/29/2022    2:17 PM 02/08/2022   11:13 AM  BP/Weight  Systolic BP 115 150 144  117 136 126  Diastolic BP 81 90 80  80 94 88  Wt. (Lbs)   310 310.2  308.6 315.8  BMI   48.55 kg/m2 48.58 kg/m2  48.33 kg/m2 49.46 kg/m2       Morbid obesity (HCC)  Patient re-educated about  the importance of commitment to a  minimum of 150 minutes of exercise per week as able.  The importance of healthy food choices with portion control discussed, as well as eating regularly and within a 12 hour window most days. The need to choose "clean , green" food 50 to 75% of the time is discussed, as well as to make water the primary drink and set a goal of 64 ounces water daily.       04/30/2022    2:29 PM 04/30/2022    8:53 AM 03/29/2022    2:17 PM  Weight /BMI  Weight 310 lb 310 lb 3.2 oz 308 lb 9.6 oz  Height 5\' 7"  (1.702 m) 5\' 7"  (  1.702 m)   BMI 48.55 kg/m2 48.58 kg/m2 48.33 kg/m2    Increase dose of mounjaro  Prediabetes Patient educated about the importance of limiting  Carbohydrate intake , the need to commit to daily physical activity for a minimum of 30 minutes , and to commit weight loss. The fact that changes in all these areas will reduce or eliminate all together the  Updated lab needed at/ before next visit. development of diabetes is stressed.      Latest Ref Rng & Units 01/28/2022    8:15 AM 12/03/2021   10:18 AM 05/06/2021   10:59 AM 11/25/2020    9:18 AM 09/04/2020   11:26 AM  Diabetic Labs  HbA1c 4.8 - 5.6 %  6.2  6.5   6.4   Chol 100 - 199 mg/dL   562     HDL >13 mg/dL   49     Calc LDL 0 - 99 mg/dL   88     Triglycerides 0 -  149 mg/dL   79     Creatinine 0.86 - 0.99 mg/dL 5.78  4.69  6.29  5.28        05/08/2022    3:16 PM 04/30/2022    2:48 PM 04/30/2022    2:29 PM 04/30/2022    8:53 AM 03/29/2022    2:21 PM 03/29/2022    2:17 PM 02/08/2022   11:13 AM  BP/Weight  Systolic BP 115 150 144  117 136 126  Diastolic BP 81 90 80  80 94 88  Wt. (Lbs)   310 310.2  308.6 315.8  BMI   48.55 kg/m2 48.58 kg/m2  48.33 kg/m2 49.46 kg/m2       No data to display            Depression, major, single episode, severe (HCC) Improved, contine current medication, also talks with therapist which she finds benficial

## 2022-05-14 ENCOUNTER — Encounter (HOSPITAL_BASED_OUTPATIENT_CLINIC_OR_DEPARTMENT_OTHER): Payer: Self-pay | Admitting: Physical Therapy

## 2022-05-14 ENCOUNTER — Ambulatory Visit (HOSPITAL_BASED_OUTPATIENT_CLINIC_OR_DEPARTMENT_OTHER): Payer: 59 | Admitting: Physical Therapy

## 2022-05-14 DIAGNOSIS — M5459 Other low back pain: Secondary | ICD-10-CM

## 2022-05-14 DIAGNOSIS — M25562 Pain in left knee: Secondary | ICD-10-CM | POA: Diagnosis not present

## 2022-05-14 DIAGNOSIS — R262 Difficulty in walking, not elsewhere classified: Secondary | ICD-10-CM

## 2022-05-14 DIAGNOSIS — G8929 Other chronic pain: Secondary | ICD-10-CM

## 2022-05-14 NOTE — Therapy (Signed)
OUTPATIENT PHYSICAL THERAPY THORACOLUMBAR    Patient Name: Jennifer [name] MRN: [id number] DOB:Nov 28, 1975, 46 y.o., female Today's Date: 05/14/2022   PT End of Session - 05/14/22 1313     Visit Number 3    Number of Visits 21    Date for PT Re-Evaluation 06/18/22    Authorization Type [employer]    PT Start Time 1300    PT Stop Time 1342    PT Time Calculation (min) 42 min    Behavior During Therapy [facility] for tasks assessed/performed             Past Medical History:  Diagnosis Date   Abnormal vaginal Pap smear    Acid reflux    Alopecia    Anxiety    Arthritis    Depression    Diabetes mellitus without complication (HCC)    prediabetic, takes no meds   Diverticulosis    Family history of diabetes mellitus    GERD (gastroesophageal reflux disease)    Hemorrhoids    History of recurrent UTIs    Hypertension    Obesity    Past Surgical History:  Procedure Laterality Date   APPENDECTOMY     COLONOSCOPY N/A 03/13/2014   Dr. Jena Gauss- grade 3 hemorrhoids, colonic diverticulosis bx= benign lymphoid polyp   COLONOSCOPY WITH PROPOFOL N/A 01/26/2021   Procedure: COLONOSCOPY WITH PROPOFOL;  Surgeon: Corbin Ade, MD;  [facility];  Service: Endoscopy;  Laterality: N/A;  AM   KNEE ARTHROSCOPY     right   POLYPECTOMY  01/26/2021   Procedure: POLYPECTOMY;  Surgeon: Corbin Ade, MD;  [facility];  Service: Endoscopy;;   Patient Active Problem List   Diagnosis Date Noted   Colovesical fistula 12/08/2021    Class: Question of   Osteoarthritis of both knees 12/03/2021   Controlled diabetes mellitus type 2 with complications (HCC) 09/09/2021   Generalized arthritis 09/09/2021   Snoring 07/06/2021   Financial difficulties 05/09/2021   Reduced vision 04/03/2021   Alternating constipation and diarrhea 12/16/2020   Hematochezia 09/09/2020   Right wrist pain 07/21/2020   Unsteady gait 07/14/2020   Poor urinary stream 05/27/2020   Depression,  major, single episode, severe (HCC) 06/18/2019   Environmental allergies 06/18/2019   Candidiasis of skin 01/22/2019   Back pain 11/28/2017   Essential hypertension 03/22/2016   Diverticulosis 11/23/2015   Hemorrhoids 03/26/2015   Insomnia due to other mental disorder 07/10/2014   GAD (generalized anxiety disorder) 07/10/2014   FHx: cancer of digestive organ 02/16/2014   Piles (hemorrhoids) 02/14/2014   Abnormal finding on Pap smear, HPV DNA positive 11/29/2013   Allergic rhinitis 02/12/2013   Prediabetes 03/26/2012   Dyspepsia 03/22/2012   TUBERCULOSIS 11/02/2010   Alopecia (capitis) totalis 11/02/2010   Chronic fatigue 07/23/2009   Morbid obesity (HCC) 11/24/2007   Depression with anxiety 11/24/2007    PCP: Syliva Overman, MD  REFERRING PROVIDER: Gaynelle Adu, MD  REFERRING DIAG: E66.01 (ICD-10-CM) - Morbid (severe) obesity due to excess calories  Rationale for Evaluation and Treatment Rehabilitation  THERAPY DIAG:  Chronic pain of left knee  Chronic pain of right knee  Other low back pain  Difficulty in walking, not elsewhere classified  ONSET DATE: knees 1998, back shortly after  SUBJECTIVE:  SUBJECTIVE STATEMENT: Pt reports she had pain relief in water last visit.  Pain in bilat knee persists.   PERTINENT HISTORY:  OA, obesity, h/o CA, GAD, h/o falls  PAIN:  Are you having pain? Yes: NPRS scale: 6 in sitting, 8 in standing/10 Pain location: bil knees (Lt>Rt) Pain description: sharp, ache Aggravating factors: constant but worse with weight bearing Relieving factors: sitting PAIN:  Are you having pain? Yes: NPRS scale: 0 in sitting/10 Pain location: lower back Pain description: feels like I need to bend over Aggravating factors: walking Relieving factors: bending  over    PRECAUTIONS: Fall  WEIGHT BEARING RESTRICTIONS No  FALLS:  Has patient fallen in last 6 months? No  LIVING ENVIRONMENT: Lives with: lives with their daughter Lives in: House/apartment Stairs: steps to enter home Has following equipment at home:  walking stick  OCCUPATION: not working right now  PLOF: Independent  PATIENT GOALS decrease pain, return to exercise   OBJECTIVE:   DIAGNOSTIC FINDINGS:  Rt knee xray 12/09/21: X-rays demonstrate marked tricompartmental degenerative changes with  valgus deformity.  Overall windswept deformity Lt knee xray 12/09/21:X-rays reveal significant tricompartmental degenerative changes with varus  deformity.  There is also medial translation of the femur on the tibia.    Overall windswept deformity.   PATIENT SURVEYS:  FOTO 35   COGNITION:  Overall cognitive status: Within functional limits for tasks assessed     SENSATION: WFL   POSTURE: flexed through pelvis, ant pelvic tilt, bil knee windswept to the Lt  PALPATION:  Notable cavitations in bilateral knees with MMT  LUMBAR ROM:   Able to demo trunk mobility in seated but in flexed position in standing    LOWER EXTREMITY MMT:  hand held dynamometry at eval  MMT Right eval Left eval  Hip flexion 19.6 lb 18.3  Hip extension    Hip abduction    Hip adduction    Hip internal rotation    Hip external rotation    Knee flexion    Knee extension 24.1 28.2  Ankle dorsiflexion    Ankle plantarflexion    Ankle inversion    Ankle eversion     (Blank rows = not tested)   FUNCTIONAL TESTS:  5 times sit to stand: unable to stand without use of UEs SLS   unable  GAIT: Ambulated into clinic with antalgic pattern using walking stick    TODAY'S TREATMENT  Pt seen for aquatic therapy today.  Treatment took place in water 3.25-4.8 ft in depth at the [facility] pool. Temp of water was 91.  Pt entered/exited the pool via stairs step-to pattern  independently with bilat rail.   Holding yellow noodle at 16ft forward, back and sidestepping.  Cues for even step length and to allow knees to flex. Holding wall: hip abdct; hip ext; hamstring curls Straddling noodle: cycling, skiing and scissor. Verbal and tactile cues for execution and positioning, gaining and maintaining balance Seated: LAQ with DF for calf stretch R/L X10; hip add/abd x 10 Sit to stand from bench x 5 (painful) Squats x 10 (slow and restful, no pain)   Pt requires buoyancy for support and to offload joints with strengthening exercises. Viscosity of the water is needed for resistance of strengthening; water current perturbations provides challenge to standing balance unsupported, requiring increased core activation.    PATIENT EDUCATION:  Education details: Teacher, music of condition, POC, HEP, exercise form/rationale, aquatics Person educated: Patient Education method: Explanation, Demonstration, Actor cues, Verbal cues, and Handouts Education comprehension:  verbalized understanding, returned demonstration, verbal cues required, tactile cues required, and needs further education   HOME EXERCISE PROGRAM: TBD  ASSESSMENT:  CLINICAL IMPRESSION: Pt demonstrates safety and indep in pool.   All activities completed with cues to encourage movement in available ranges allowing for improved circulation and pain reduction using properties of water.  Pt often observed keeping Rt knee more stiff. She tolerates LE movement best water of depth at least 4 ft. Pt reports reduction in pain to 4/10 during session.Encouraged pt to look in her area for pool to continue completing aquatic exercises outside of session for continued pain relief.  She is a good candidate for aquatic therapy using the properties of water to facilitate and hasten progress towards goals.   OBJECTIVE IMPAIRMENTS Abnormal gait, decreased activity tolerance, decreased balance, decreased endurance, decreased  mobility, difficulty walking, decreased strength, improper body mechanics, postural dysfunction, obesity, and pain.   ACTIVITY LIMITATIONS carrying, lifting, bending, standing, stairs, transfers, and locomotion level  PARTICIPATION LIMITATIONS: meal prep, cleaning, shopping, and community activity  PERSONAL FACTORS 1-2 comorbidities: h/o falls, GAD  are also affecting patient's functional outcome.   REHAB POTENTIAL: Fair    CLINICAL DECISION MAKING: Evolving/moderate complexity  EVALUATION COMPLEXITY: Moderate   GOALS: Goals reviewed with patient? Yes  SHORT TERM GOALS: Target date: 04/26/2022  Pt will find a proper working level in pool to tolerance of knees Baseline: Goal status: INITIAL   LONG TERM GOALS: Target date: 06/20/22  Able to ambulate from car to pool without rest break Baseline:  Goal status: INITIAL  2.  FOTO to meet stated goal Baseline:  Goal status: INITIAL  3.  Gross LE strength to increase by at least 10% bilaterally via hand held dynamometry testing Baseline:  Goal status: INITIAL  4.  Pt will verbalize a notable increase in participation of ADLs and more frequent mobility Baseline:  Goal status: INITIAL    PLAN: PT FREQUENCY: 2x/week  PT DURATION: 10 weeks  PLANNED INTERVENTIONS: Therapeutic exercises, Therapeutic activity, Neuromuscular re-education, Balance training, Gait training, Patient/Family education, Joint mobilization, Aquatic Therapy, Dry Needling, Electrical stimulation, Spinal mobilization, Cryotherapy, Moist heat, Taping, Manual therapy, and Re-evaluation.  PLAN FOR NEXT SESSION: continue aquatics.  Mayer Camel, PTA 05/14/22 3:29 PM

## 2022-05-17 ENCOUNTER — Ambulatory Visit (HOSPITAL_BASED_OUTPATIENT_CLINIC_OR_DEPARTMENT_OTHER): Payer: 59 | Attending: General Surgery | Admitting: Physical Therapy

## 2022-05-17 ENCOUNTER — Encounter (HOSPITAL_BASED_OUTPATIENT_CLINIC_OR_DEPARTMENT_OTHER): Payer: Self-pay | Admitting: Physical Therapy

## 2022-05-17 DIAGNOSIS — M6281 Muscle weakness (generalized): Secondary | ICD-10-CM | POA: Insufficient documentation

## 2022-05-17 DIAGNOSIS — M25561 Pain in right knee: Secondary | ICD-10-CM | POA: Diagnosis not present

## 2022-05-17 DIAGNOSIS — M5459 Other low back pain: Secondary | ICD-10-CM | POA: Diagnosis not present

## 2022-05-17 DIAGNOSIS — G8929 Other chronic pain: Secondary | ICD-10-CM | POA: Diagnosis not present

## 2022-05-17 DIAGNOSIS — M25562 Pain in left knee: Secondary | ICD-10-CM | POA: Insufficient documentation

## 2022-05-17 DIAGNOSIS — R262 Difficulty in walking, not elsewhere classified: Secondary | ICD-10-CM | POA: Insufficient documentation

## 2022-05-17 NOTE — Therapy (Signed)
OUTPATIENT PHYSICAL THERAPY THORACOLUMBAR    Patient Name: Jennifer [name] MRN: [id number] DOB:03-20-1976, 46 y.o., female Today's Date: 05/17/2022   PT End of Session - 05/17/22 1208     Visit Number 4    Number of Visits 21    Date for PT Re-Evaluation 06/18/22    Authorization Type [employer]    PT Start Time 1200    PT Stop Time 1245    PT Time Calculation (min) 45 min    Behavior During Therapy [facility] for tasks assessed/performed             Past Medical History:  Diagnosis Date   Abnormal vaginal Pap smear    Acid reflux    Alopecia    Anxiety    Arthritis    Depression    Diabetes mellitus without complication (HCC)    prediabetic, takes no meds   Diverticulosis    Family history of diabetes mellitus    GERD (gastroesophageal reflux disease)    Hemorrhoids    History of recurrent UTIs    Hypertension    Obesity    Past Surgical History:  Procedure Laterality Date   APPENDECTOMY     COLONOSCOPY N/A 03/13/2014   Dr. Jena Gauss- grade 3 hemorrhoids, colonic diverticulosis bx= benign lymphoid polyp   COLONOSCOPY WITH PROPOFOL N/A 01/26/2021   Procedure: COLONOSCOPY WITH PROPOFOL;  Surgeon: Corbin Ade, MD;  [facility];  Service: Endoscopy;  Laterality: N/A;  AM   KNEE ARTHROSCOPY     right   POLYPECTOMY  01/26/2021   Procedure: POLYPECTOMY;  Surgeon: Corbin Ade, MD;  Location: [facility];  Service: Endoscopy;;   Patient Active Problem List   Diagnosis Date Noted   Colovesical fistula 12/08/2021    Class: Question of   Osteoarthritis of both knees 12/03/2021   Controlled diabetes mellitus type 2 with complications (HCC) 09/09/2021   Generalized arthritis 09/09/2021   Snoring 07/06/2021   Financial difficulties 05/09/2021   Reduced vision 04/03/2021   Alternating constipation and diarrhea 12/16/2020   Hematochezia 09/09/2020   Right wrist pain 07/21/2020   Unsteady gait 07/14/2020   Poor urinary stream 05/27/2020   Depression,  major, single episode, severe (HCC) 06/18/2019   Environmental allergies 06/18/2019   Candidiasis of skin 01/22/2019   Back pain 11/28/2017   Essential hypertension 03/22/2016   Diverticulosis 11/23/2015   Hemorrhoids 03/26/2015   Insomnia due to other mental disorder 07/10/2014   GAD (generalized anxiety disorder) 07/10/2014   FHx: cancer of digestive organ 02/16/2014   Piles (hemorrhoids) 02/14/2014   Abnormal finding on Pap smear, HPV DNA positive 11/29/2013   Allergic rhinitis 02/12/2013   Prediabetes 03/26/2012   Dyspepsia 03/22/2012   TUBERCULOSIS 11/02/2010   Alopecia (capitis) totalis 11/02/2010   Chronic fatigue 07/23/2009   Morbid obesity (HCC) 11/24/2007   Depression with anxiety 11/24/2007    PCP: Syliva Overman, MD  REFERRING PROVIDER: Gaynelle Adu, MD  REFERRING DIAG: E66.01 (ICD-10-CM) - Morbid (severe) obesity due to excess calories  Rationale for Evaluation and Treatment Rehabilitation  THERAPY DIAG:  Chronic pain of left knee  Chronic pain of right knee  Other low back pain  Difficulty in walking, not elsewhere classified  Muscle weakness (generalized)  ONSET DATE: knees 1998, back shortly after  SUBJECTIVE:  SUBJECTIVE STATEMENT: "Walked all from parking lot to pool without rest period but slow,  hurting 7/10 after"  PERTINENT HISTORY:  OA, obesity, h/o CA, GAD, h/o falls  PAIN: Current left knee 7/10 Are you having pain? Yes: NPRS scale: 6 in sitting, 8 in standing/10 Pain location: bil knees (Lt>Rt) Pain description: sharp, ache Aggravating factors: constant but worse with weight bearing Relieving factors: sitting PAIN:  Are you having pain? Yes: NPRS scale: 0 in sitting/10 Pain location: lower back Pain description: feels like I need to bend  over Aggravating factors: walking Relieving factors: bending over    PRECAUTIONS: Fall  WEIGHT BEARING RESTRICTIONS No  FALLS:  Has patient fallen in last 6 months? No  LIVING ENVIRONMENT: Lives with: lives with their daughter Lives in: House/apartment Stairs: steps to enter home Has following equipment at home:  walking stick  OCCUPATION: not working right now  PLOF: Independent  PATIENT GOALS decrease pain, return to exercise   OBJECTIVE:   DIAGNOSTIC FINDINGS:  Rt knee xray 12/09/21: X-rays demonstrate marked tricompartmental degenerative changes with  valgus deformity.  Overall windswept deformity Lt knee xray 12/09/21:X-rays reveal significant tricompartmental degenerative changes with varus  deformity.  There is also medial translation of the femur on the tibia.    Overall windswept deformity.   PATIENT SURVEYS:  FOTO 35   COGNITION:  Overall cognitive status: Within functional limits for tasks assessed     SENSATION: WFL   POSTURE: flexed through pelvis, ant pelvic tilt, bil knee windswept to the Lt  PALPATION:  Notable cavitations in bilateral knees with MMT  LUMBAR ROM:   Able to demo trunk mobility in seated but in flexed position in standing    LOWER EXTREMITY MMT:  hand held dynamometry at eval  MMT Right eval Left eval  Hip flexion 19.6 lb 18.3  Hip extension    Hip abduction    Hip adduction    Hip internal rotation    Hip external rotation    Knee flexion    Knee extension 24.1 28.2  Ankle dorsiflexion    Ankle plantarflexion    Ankle inversion    Ankle eversion     (Blank rows = not tested)   FUNCTIONAL TESTS:  5 times sit to stand: unable to stand without use of UEs SLS   unable  GAIT: Ambulated into clinic with antalgic pattern using walking stick    TODAY'S TREATMENT  Pt seen for aquatic therapy today.  Treatment took place in water 3.25-4.8 ft in depth at the [facility] pool. Temp of water was 91.   Pt entered/exited the pool via stairs step-to pattern independently with bilat rail.   Holding yellow noodle at 85ft forward, back and sidestepping.  Cues for even step length and to allow knees to flex. Seated: cycling x4 mins; add/abd; flutter kicking 3x20 for strengthening and aerobic capacity Holding wall 4.79ft: hip abdct; hip ext; hamstring curls R/L x10 Squoodle pull down 2x10; Lumbar rotation x 10 between Squats 2x 10 Seated: LAQ R/Lx10 Calf stretch in seated R/L 3x2s hold    Pt requires buoyancy for support and to offload joints with strengthening exercises. Viscosity of the water is needed for resistance of strengthening; water current perturbations provides challenge to standing balance unsupported, requiring increased core activation.    PATIENT EDUCATION:  Education details: Teacher, music of condition, POC, HEP, exercise form/rationale, aquatics Person educated: Patient Education method: Explanation, Demonstration, Actor cues, Verbal cues, and Handouts Education comprehension: verbalized understanding, returned demonstration, verbal cues  required, tactile cues required, and needs further education   HOME EXERCISE PROGRAM: [id number]  ASSESSMENT:  CLINICAL IMPRESSION: Pt with improved toleration to amb as she walked from parking lot to pool using cane rather than using WC. She uses wc to return. Cues for increased knee flex with swing and increased step length with amb submerged. She tolerates full aquatic session well. STG met.Goals ongoing     OBJECTIVE IMPAIRMENTS Abnormal gait, decreased activity tolerance, decreased balance, decreased endurance, decreased mobility, difficulty walking, decreased strength, improper body mechanics, postural dysfunction, obesity, and pain.   ACTIVITY LIMITATIONS carrying, lifting, bending, standing, stairs, transfers, and locomotion level  PARTICIPATION LIMITATIONS: meal prep, cleaning, shopping, and community activity  PERSONAL FACTORS  1-2 comorbidities: h/o falls, GAD  are also affecting patient's functional outcome.   REHAB POTENTIAL: Fair    CLINICAL DECISION MAKING: Evolving/moderate complexity  EVALUATION COMPLEXITY: Moderate   GOALS: Goals reviewed with patient? Yes  SHORT TERM GOALS: Target date: 04/26/2022  Pt will find a proper working level in pool to tolerance of knees Baseline: Goal status:Achieved   LONG TERM GOALS: Target date: 06/20/22  Able to ambulate from car to pool without rest break Baseline:  Goal status: INITIAL  2.  FOTO to meet stated goal Baseline:  Goal status: INITIAL  3.  Gross LE strength to increase by at least 10% bilaterally via hand held dynamometry testing Baseline:  Goal status: INITIAL  4.  Pt will verbalize a notable increase in participation of ADLs and more frequent mobility Baseline:  Goal status: INITIAL    PLAN: PT FREQUENCY: 2x/week  PT DURATION: 10 weeks  PLANNED INTERVENTIONS: Therapeutic exercises, Therapeutic activity, Neuromuscular re-education, Balance training, Gait training, Patient/Family education, Joint mobilization, Aquatic Therapy, Dry Needling, Electrical stimulation, Spinal mobilization, Cryotherapy, Moist heat, Taping, Manual therapy, and Re-evaluation.  PLAN FOR NEXT SESSION: continue aquatics.  Lovene Maret Tomma Lightning) Durward Matranga MPT 05/17/22 12:12 PM

## 2022-05-21 ENCOUNTER — Encounter (HOSPITAL_BASED_OUTPATIENT_CLINIC_OR_DEPARTMENT_OTHER): Payer: Self-pay

## 2022-05-21 ENCOUNTER — Ambulatory Visit (HOSPITAL_BASED_OUTPATIENT_CLINIC_OR_DEPARTMENT_OTHER): Payer: 59 | Admitting: Physical Therapy

## 2022-05-24 ENCOUNTER — Encounter (HOSPITAL_BASED_OUTPATIENT_CLINIC_OR_DEPARTMENT_OTHER): Payer: Self-pay | Admitting: Physical Therapy

## 2022-05-24 ENCOUNTER — Encounter: Payer: Self-pay | Admitting: Dietician

## 2022-05-24 ENCOUNTER — Ambulatory Visit (HOSPITAL_BASED_OUTPATIENT_CLINIC_OR_DEPARTMENT_OTHER): Payer: 59 | Admitting: Physical Therapy

## 2022-05-24 ENCOUNTER — Encounter: Payer: 59 | Attending: Family Medicine | Admitting: Dietician

## 2022-05-24 DIAGNOSIS — G8929 Other chronic pain: Secondary | ICD-10-CM | POA: Diagnosis not present

## 2022-05-24 DIAGNOSIS — F419 Anxiety disorder, unspecified: Secondary | ICD-10-CM | POA: Insufficient documentation

## 2022-05-24 DIAGNOSIS — R262 Difficulty in walking, not elsewhere classified: Secondary | ICD-10-CM | POA: Diagnosis not present

## 2022-05-24 DIAGNOSIS — M1990 Unspecified osteoarthritis, unspecified site: Secondary | ICD-10-CM | POA: Diagnosis not present

## 2022-05-24 DIAGNOSIS — M5459 Other low back pain: Secondary | ICD-10-CM | POA: Diagnosis not present

## 2022-05-24 DIAGNOSIS — Z713 Dietary counseling and surveillance: Secondary | ICD-10-CM | POA: Diagnosis not present

## 2022-05-24 DIAGNOSIS — M25561 Pain in right knee: Secondary | ICD-10-CM | POA: Diagnosis not present

## 2022-05-24 DIAGNOSIS — M6281 Muscle weakness (generalized): Secondary | ICD-10-CM | POA: Diagnosis not present

## 2022-05-24 DIAGNOSIS — K219 Gastro-esophageal reflux disease without esophagitis: Secondary | ICD-10-CM | POA: Insufficient documentation

## 2022-05-24 DIAGNOSIS — Z6841 Body Mass Index (BMI) 40.0 and over, adult: Secondary | ICD-10-CM | POA: Insufficient documentation

## 2022-05-24 DIAGNOSIS — I10 Essential (primary) hypertension: Secondary | ICD-10-CM | POA: Diagnosis not present

## 2022-05-24 DIAGNOSIS — F32A Depression, unspecified: Secondary | ICD-10-CM | POA: Insufficient documentation

## 2022-05-24 DIAGNOSIS — E669 Obesity, unspecified: Secondary | ICD-10-CM

## 2022-05-24 DIAGNOSIS — M25562 Pain in left knee: Secondary | ICD-10-CM | POA: Diagnosis not present

## 2022-05-24 NOTE — Therapy (Signed)
OUTPATIENT PHYSICAL THERAPY THORACOLUMBAR    Patient Name: Jennifer [name] MRN: [id number] DOB:1976/09/13, 46 y.o., female Today's Date: 05/24/2022   PT End of Session - 05/24/22 1235     Visit Number 5    Number of Visits 21    Date for PT Re-Evaluation 06/18/22    Authorization Type [employer]    PT Start Time 1201    PT Stop Time 1245    PT Time Calculation (min) 44 min    Activity Tolerance Patient limited by pain    Behavior During Therapy [facility] for tasks assessed/performed             Past Medical History:  Diagnosis Date   Abnormal vaginal Pap smear    Acid reflux    Alopecia    Anxiety    Arthritis    Depression    Diabetes mellitus without complication (HCC)    prediabetic, takes no meds   Diverticulosis    Family history of diabetes mellitus    GERD (gastroesophageal reflux disease)    Hemorrhoids    History of recurrent UTIs    Hypertension    Obesity    Past Surgical History:  Procedure Laterality Date   APPENDECTOMY     COLONOSCOPY N/A 03/13/2014   Dr. Jena Gauss- grade 3 hemorrhoids, colonic diverticulosis bx= benign lymphoid polyp   COLONOSCOPY WITH PROPOFOL N/A 01/26/2021   Procedure: COLONOSCOPY WITH PROPOFOL;  Surgeon: Corbin Ade, MD;  [facility];  Service: Endoscopy;  Laterality: N/A;  AM   KNEE ARTHROSCOPY     right   POLYPECTOMY  01/26/2021   Procedure: POLYPECTOMY;  Surgeon: Corbin Ade, MD;  [facility];  Service: Endoscopy;;   Patient Active Problem List   Diagnosis Date Noted   Colovesical fistula 12/08/2021    Class: Question of   Osteoarthritis of both knees 12/03/2021   Controlled diabetes mellitus type 2 with complications (HCC) 09/09/2021   Generalized arthritis 09/09/2021   Snoring 07/06/2021   Financial difficulties 05/09/2021   Reduced vision 04/03/2021   Alternating constipation and diarrhea 12/16/2020   Hematochezia 09/09/2020   Right wrist pain 07/21/2020   Unsteady gait 07/14/2020    Poor urinary stream 05/27/2020   Depression, major, single episode, severe (HCC) 06/18/2019   Environmental allergies 06/18/2019   Candidiasis of skin 01/22/2019   Back pain 11/28/2017   Essential hypertension 03/22/2016   Diverticulosis 11/23/2015   Hemorrhoids 03/26/2015   Insomnia due to other mental disorder 07/10/2014   GAD (generalized anxiety disorder) 07/10/2014   FHx: cancer of digestive organ 02/16/2014   Piles (hemorrhoids) 02/14/2014   Abnormal finding on Pap smear, HPV DNA positive 11/29/2013   Allergic rhinitis 02/12/2013   Prediabetes 03/26/2012   Dyspepsia 03/22/2012   TUBERCULOSIS 11/02/2010   Alopecia (capitis) totalis 11/02/2010   Chronic fatigue 07/23/2009   Morbid obesity (HCC) 11/24/2007   Depression with anxiety 11/24/2007    PCP: Syliva Overman, MD  REFERRING PROVIDER: Gaynelle Adu, MD  REFERRING DIAG: E66.01 (ICD-10-CM) - Morbid (severe) obesity due to excess calories  Rationale for Evaluation and Treatment Rehabilitation  THERAPY DIAG:  Chronic pain of left knee  Chronic pain of right knee  Other low back pain  Difficulty in walking, not elsewhere classified  Muscle weakness (generalized)  ONSET DATE: knees 1998, back shortly after  SUBJECTIVE:  SUBJECTIVE STATEMENT: "I'm feeling down, I don't think this is ever going to get better"  PERTINENT HISTORY:  OA, obesity, h/o CA, GAD, h/o falls  PAIN: Current left knee 7/10 Are you having pain? Yes: NPRS scale: 6 in sitting, 8 in standing/10 Pain location: bil knees (Lt>Rt) Pain description: sharp, ache Aggravating factors: constant but worse with weight bearing Relieving factors: sitting PAIN:  Are you having pain? Yes: NPRS scale: 0 in sitting/10 Pain location: lower back Pain description: feels  like I need to bend over Aggravating factors: walking Relieving factors: bending over    PRECAUTIONS: Fall  WEIGHT BEARING RESTRICTIONS No  FALLS:  Has patient fallen in last 6 months? No  LIVING ENVIRONMENT: Lives with: lives with their daughter Lives in: House/apartment Stairs: steps to enter home Has following equipment at home:  walking stick  OCCUPATION: not working right now  PLOF: Independent  PATIENT GOALS decrease pain, return to exercise   OBJECTIVE:   DIAGNOSTIC FINDINGS:  Rt knee xray 12/09/21: X-rays demonstrate marked tricompartmental degenerative changes with  valgus deformity.  Overall windswept deformity Lt knee xray 12/09/21:X-rays reveal significant tricompartmental degenerative changes with varus  deformity.  There is also medial translation of the femur on the tibia.    Overall windswept deformity.   PATIENT SURVEYS:  FOTO 35   COGNITION:  Overall cognitive status: Within functional limits for tasks assessed     SENSATION: WFL   POSTURE: flexed through pelvis, ant pelvic tilt, bil knee windswept to the Lt  PALPATION:  Notable cavitations in bilateral knees with MMT  LUMBAR ROM:   Able to demo trunk mobility in seated but in flexed position in standing    LOWER EXTREMITY MMT:  hand held dynamometry at eval  MMT Right eval Left eval  Hip flexion 19.6 lb 18.3  Hip extension    Hip abduction    Hip adduction    Hip internal rotation    Hip external rotation    Knee flexion    Knee extension 24.1 28.2  Ankle dorsiflexion    Ankle plantarflexion    Ankle inversion    Ankle eversion     (Blank rows = not tested)   FUNCTIONAL TESTS:  5 times sit to stand: unable to stand without use of UEs SLS   unable  GAIT: Ambulated into clinic with antalgic pattern using walking stick    TODAY'S TREATMENT  Pt seen for aquatic therapy today.  Treatment took place in water 3.25-4.8 ft in depth at the [facility] pool. Temp  of water was 91.  Pt entered/exited the pool via stairs step-to pattern independently with bilat rail.   Holding yellow noodle at 70ft forward, back and sidestepping.   Straddling yellow noodle and holding to corner of pool cycling and add/abd Seated: cycling x4 mins; add/abd; flutter kicking 3x20 for strengthening and aerobic capacity Seated: LAQ R/L 2x10 Calf stretch in seated R/L 3x20s hold Holding wall 4.78ft: df and PF  Squoodle pull down 2x15    Pt requires buoyancy for support and to offload joints with strengthening exercises. Viscosity of the water is needed for resistance of strengthening; water current perturbations provides challenge to standing balance unsupported, requiring increased core activation.    PATIENT EDUCATION:  Education details: Teacher, music of condition, POC, HEP, exercise form/rationale, aquatics Person educated: Patient Education method: Explanation, Demonstration, Actor cues, Verbal cues, and Handouts Education comprehension: verbalized understanding, returned demonstration, verbal cues required, tactile cues required, and needs further education   HOME EXERCISE  PROGRAM: [id number]  ASSESSMENT:  CLINICAL IMPRESSION: Pt given encouragement going forward with her weight loss journey and progressive OA in knees.  She has been walking more recently increasing knee pain bilaterally about joint lines. She is limited in exercise due to the pain tolerating session only fair..  All LE exercises completed unloaded. Goals ongoing.  Next visit Foto.       OBJECTIVE IMPAIRMENTS Abnormal gait, decreased activity tolerance, decreased balance, decreased endurance, decreased mobility, difficulty walking, decreased strength, improper body mechanics, postural dysfunction, obesity, and pain.   ACTIVITY LIMITATIONS carrying, lifting, bending, standing, stairs, transfers, and locomotion level  PARTICIPATION LIMITATIONS: meal prep, cleaning, shopping, and community  activity  PERSONAL FACTORS 1-2 comorbidities: h/o falls, GAD  are also affecting patient's functional outcome.   REHAB POTENTIAL: Fair    CLINICAL DECISION MAKING: Evolving/moderate complexity  EVALUATION COMPLEXITY: Moderate   GOALS: Goals reviewed with patient? Yes  SHORT TERM GOALS: Target date: 04/26/2022  Pt will find a proper working level in pool to tolerance of knees Baseline: Goal status:Achieved   LONG TERM GOALS: Target date: 06/20/22  Able to ambulate from car to pool without rest break Baseline:  Goal status: INITIAL  2.  FOTO to meet stated goal Baseline:  Goal status: INITIAL  3.  Gross LE strength to increase by at least 10% bilaterally via hand held dynamometry testing Baseline:  Goal status: INITIAL  4.  Pt will verbalize a notable increase in participation of ADLs and more frequent mobility Baseline:  Goal status: INITIAL    PLAN: PT FREQUENCY: 2x/week  PT DURATION: 10 weeks  PLANNED INTERVENTIONS: Therapeutic exercises, Therapeutic activity, Neuromuscular re-education, Balance training, Gait training, Patient/Family education, Joint mobilization, Aquatic Therapy, Dry Needling, Electrical stimulation, Spinal mobilization, Cryotherapy, Moist heat, Taping, Manual therapy, and Re-evaluation.  PLAN FOR NEXT SESSION: continue aquatics.  Corrie Dandy Tomma Lightning) Reda Citron MPT 05/24/22 12:38 PM

## 2022-05-24 NOTE — Progress Notes (Signed)
Supervised Weight Loss Visit Bariatric Nutrition Education  Planned surgery: RYGB Pt expectation of surgery: to be comfortable  1 out of 6 SWL Appointments   NUTRITION ASSESSMENT   Anthropometrics  Start weight at [facility]: 310.2 lbs (date: 04/30/2022)  Height: 67 in Weight 307.5 lbs. BMI: 48.16 kg/m2    Clinical  Medical hx: Anxiety, arthritis, depression, GERD, HTN Medications: Megestrol, Mounjaro, buspirone oxybutynin, triamterene, venlafaxine XR  Labs: 12/03/2021: A1C 6.2 Notable signs/symptoms: none noted Any previous deficiencies? No  Lifestyle & Dietary Hx  Pt states she went to hospital with a swollen uvula, stating she needs to follow up her PCP. Pt states she was craving a cake pop from [place], so she went an got her one, stating she does not do it often.  Pt states she can sub with a piece of fruit. Pt states she could not go to pool therapy, stating she is having trouble with her cycle.  Pt states she loves pool therapy. Pt states she and her daughter are going to join [employer] [facility]). Pt states she cut back on the [facility] drinks.  Estimated daily fluid intake: 64+ oz Supplements:  Current average weekly physical activity: pool therapy  24-Hr Dietary Recall First Meal: [place] chicken biscuit coffee every morning or skip Snack:  Second Meal: salad with some fruit or fish sandwich from [employer] Snack: orange sometimes Third Meal: grilled chicken, green beans Snack: fruit like [place] or watermelon or crackers Beverages: water, sprite  Estimated Energy Needs 1500   NUTRITION DIAGNOSIS  Overweight/obesity ([place]-3.3) related to past poor dietary habits and physical inactivity as evidenced by patient w/ planned RYGB surgery following dietary guidelines for continued weight loss.   NUTRITION INTERVENTION  Nutrition counseling (C-1) and education (E-2) to facilitate bariatric surgery goals.  Pre-Op Goals Progress & New Goals Continue: reduce  sugary drinks and soda Continue: water therapy  Handouts Provided Include    Learning Style & Readiness for Change Teaching method utilized: Visual & Auditory  Demonstrated degree of understanding via: Teach Back  Readiness Level: contemplative Barriers to learning/adherence to lifestyle change: previous habits  RD's Notes for next Visit  Pt progress toward chosen goals   MONITORING & EVALUATION Dietary intake, weekly physical activity, body weight, and pre-op goals in 1 month.   Next Steps  Patient is to return to [facility] in 1 month for next SWL visit.

## 2022-05-25 ENCOUNTER — Encounter: Payer: Self-pay | Admitting: Family Medicine

## 2022-05-25 ENCOUNTER — Other Ambulatory Visit: Payer: Self-pay | Admitting: Family Medicine

## 2022-05-27 ENCOUNTER — Other Ambulatory Visit: Payer: Self-pay | Admitting: Family Medicine

## 2022-05-27 MED ORDER — SEMAGLUTIDE-WEIGHT MANAGEMENT 1.7 MG/0.75ML ~~LOC~~ SOAJ
1.7000 mg | SUBCUTANEOUS | 2 refills | Status: DC
Start: 2022-05-27 — End: 2022-07-15

## 2022-05-27 NOTE — Telephone Encounter (Signed)
[employer] will not cover any weight loss medication

## 2022-05-27 NOTE — Progress Notes (Signed)
Wegovy prescribed in place of monjaro

## 2022-05-28 ENCOUNTER — Ambulatory Visit (HOSPITAL_BASED_OUTPATIENT_CLINIC_OR_DEPARTMENT_OTHER): Payer: 59 | Admitting: Physical Therapy

## 2022-05-28 ENCOUNTER — Encounter (HOSPITAL_BASED_OUTPATIENT_CLINIC_OR_DEPARTMENT_OTHER): Payer: Self-pay | Admitting: Physical Therapy

## 2022-05-28 DIAGNOSIS — M6281 Muscle weakness (generalized): Secondary | ICD-10-CM | POA: Diagnosis not present

## 2022-05-28 DIAGNOSIS — M25561 Pain in right knee: Secondary | ICD-10-CM | POA: Diagnosis not present

## 2022-05-28 DIAGNOSIS — R262 Difficulty in walking, not elsewhere classified: Secondary | ICD-10-CM

## 2022-05-28 DIAGNOSIS — G8929 Other chronic pain: Secondary | ICD-10-CM | POA: Diagnosis not present

## 2022-05-28 DIAGNOSIS — M5459 Other low back pain: Secondary | ICD-10-CM | POA: Diagnosis not present

## 2022-05-28 DIAGNOSIS — M25562 Pain in left knee: Secondary | ICD-10-CM | POA: Diagnosis not present

## 2022-05-28 NOTE — Therapy (Signed)
OUTPATIENT PHYSICAL THERAPY THORACOLUMBAR    Patient Name: Jennifer [name] MRN: [id number] DOB:1976-08-27, 46 y.o., female Today's Date: 05/28/2022   PT End of Session - 05/28/22 1313     Visit Number 6    Number of Visits 21    Date for PT Re-Evaluation 06/18/22    Authorization Type [employer]    PT Start Time 1315    PT Stop Time 1400    PT Time Calculation (min) 45 min    Activity Tolerance Patient limited by pain    Behavior During Therapy [facility] for tasks assessed/performed             Past Medical History:  Diagnosis Date   Abnormal vaginal Pap smear    Acid reflux    Alopecia    Anxiety    Arthritis    Depression    Diabetes mellitus without complication (HCC)    prediabetic, takes no meds   Diverticulosis    Family history of diabetes mellitus    GERD (gastroesophageal reflux disease)    Hemorrhoids    History of recurrent UTIs    Hypertension    Obesity    Past Surgical History:  Procedure Laterality Date   APPENDECTOMY     COLONOSCOPY N/A 03/13/2014   Dr. Jena Gauss- grade 3 hemorrhoids, colonic diverticulosis bx= benign lymphoid polyp   COLONOSCOPY WITH PROPOFOL N/A 01/26/2021   Procedure: COLONOSCOPY WITH PROPOFOL;  Surgeon: Corbin Ade, MD;  [facility]: [facility];  Service: Endoscopy;  Laterality: N/A;  AM   KNEE ARTHROSCOPY     right   POLYPECTOMY  01/26/2021   Procedure: POLYPECTOMY;  Surgeon: Corbin Ade, MD;  Location: [facility];  Service: Endoscopy;;   Patient Active Problem List   Diagnosis Date Noted   Colovesical fistula 12/08/2021    Class: Question of   Osteoarthritis of both knees 12/03/2021   Controlled diabetes mellitus type 2 with complications (HCC) 09/09/2021   Generalized arthritis 09/09/2021   Snoring 07/06/2021   Financial difficulties 05/09/2021   Reduced vision 04/03/2021   Alternating constipation and diarrhea 12/16/2020   Hematochezia 09/09/2020   Right wrist pain 07/21/2020   Unsteady gait 07/14/2020    Poor urinary stream 05/27/2020   Depression, major, single episode, severe (HCC) 06/18/2019   Environmental allergies 06/18/2019   Candidiasis of skin 01/22/2019   Back pain 11/28/2017   Essential hypertension 03/22/2016   Diverticulosis 11/23/2015   Hemorrhoids 03/26/2015   Insomnia due to other mental disorder 07/10/2014   GAD (generalized anxiety disorder) 07/10/2014   FHx: cancer of digestive organ 02/16/2014   Piles (hemorrhoids) 02/14/2014   Abnormal finding on Pap smear, HPV DNA positive 11/29/2013   Allergic rhinitis 02/12/2013   Prediabetes 03/26/2012   Dyspepsia 03/22/2012   TUBERCULOSIS 11/02/2010   Alopecia (capitis) totalis 11/02/2010   Chronic fatigue 07/23/2009   Morbid obesity (HCC) 11/24/2007   Depression with anxiety 11/24/2007    PCP: Syliva Overman, MD  REFERRING PROVIDER: Gaynelle Adu, MD  REFERRING DIAG: E66.01 (ICD-10-CM) - Morbid (severe) obesity due to excess calories  Rationale for Evaluation and Treatment Rehabilitation  THERAPY DIAG:  Chronic pain of left knee  Chronic pain of right knee  Other low back pain  Difficulty in walking, not elsewhere classified  ONSET DATE: knees 1998, back shortly after  SUBJECTIVE:  SUBJECTIVE STATEMENT: Pt reports her knees have been popping more "maybe because I'm moving them more".  She states her LE are feeling looser since starting therapy.  She tolerated last session well. She has not had a chance to check out pool to transition to. She has an appt at 6pm tonight to discuss insurance options.   PERTINENT HISTORY:  OA, obesity, h/o CA, GAD, h/o falls  PAIN:  Are you having pain? Yes: NPRS scale: 4/10 Pain location: bil knees (Lt>Rt) Pain description: sharp, ache Aggravating factors: constant but worse with weight  bearing Relieving factors: sitting    PRECAUTIONS: Fall  WEIGHT BEARING RESTRICTIONS No  FALLS:  Has patient fallen in last 6 months? No  LIVING ENVIRONMENT: Lives with: lives with their daughter Lives in: House/apartment Stairs: steps to enter home Has following equipment at home:  walking stick  OCCUPATION: not working right now  PLOF: Independent  PATIENT GOALS decrease pain, return to exercise   OBJECTIVE:   DIAGNOSTIC FINDINGS:  Rt knee xray 12/09/21: X-rays demonstrate marked tricompartmental degenerative changes with  valgus deformity.  Overall windswept deformity Lt knee xray 12/09/21:X-rays reveal significant tricompartmental degenerative changes with varus  deformity.  There is also medial translation of the femur on the tibia.    Overall windswept deformity.   PATIENT SURVEYS:  FOTO 35   COGNITION:  Overall cognitive status: Within functional limits for tasks assessed     SENSATION: WFL   POSTURE: flexed through pelvis, ant pelvic tilt, bil knee windswept to the Lt  PALPATION:  Notable cavitations in bilateral knees with MMT  LUMBAR ROM:   Able to demo trunk mobility in seated but in flexed position in standing    LOWER EXTREMITY MMT:  hand held dynamometry at eval  MMT Right eval Left eval  Hip flexion 19.6 lb 18.3  Hip extension    Hip abduction    Hip adduction    Hip internal rotation    Hip external rotation    Knee flexion    Knee extension 24.1 28.2  Ankle dorsiflexion    Ankle plantarflexion    Ankle inversion    Ankle eversion     (Blank rows = not tested)   FUNCTIONAL TESTS:  5 times sit to stand: unable to stand without use of UEs SLS   unable  GAIT: Ambulated into clinic with antalgic pattern using walking stick    TODAY'S TREATMENT  Pt seen for aquatic therapy today.  Treatment took place in water 3.25-4.8 ft in depth at the [facility] pool. Temp of water was 91.  Pt entered/exited the pool via  stairs step-to pattern independently with bilat rail.  Trial of walking backwards down steps with improved tolerance.  Holding yellow noodle at 4.25 ft forward, back and sidestepping.   Holding wall: hip abdct/ add x 5 x 2; hip ext x 5 x 2 Squoodle pull down 2x15 Straddling yellow noodle:  squats in 3 ft water and small bounces;  cycling, add/abd, cc ski At 4 ft:  forward step up on blue 5" step x 5 each LE; side step up (straddling step)- with UE On wall x 5 each- cues to allow R knee to flex  Return to forward/ backward walking for rest and recovery Seated on bench in water: LAQ R/L x10, flutter, hip abdct/ add  STS from bench in water x 5, cues for hip hinge and knee flexion  Pt requires buoyancy for support and to offload joints with strengthening exercises. Viscosity  of the water is needed for resistance of strengthening; water current perturbations provides challenge to standing balance unsupported, requiring increased core activation.    PATIENT EDUCATION:  Education details: Teacher, music of condition, POC, HEP, exercise form/rationale, aquatics Person educated: Patient Education method: Explanation, Demonstration, Tactile cues, Verbal cues, and Handouts Education comprehension: verbalized understanding, returned demonstration, verbal cues required, tactile cues required, and needs further education   HOME EXERCISE PROGRAM: [id number]  ASSESSMENT:  CLINICAL IMPRESSION: Pt reports reduction of pain in bilat knees to 2/10 when submerged 75% in water.  She was able to ascend 5" step in 67ft of water with minimal pain. Requires some cues to flex Rt knee with swing through of gait and when descending step, as well as more upright posture with trunk (shoulders over hips vs forward flexion). Encouraged pt to take time to explore pool options so that she continue exercises outside of therapy session. Making gradual progress towards remaining goals.     OBJECTIVE IMPAIRMENTS Abnormal gait,  decreased activity tolerance, decreased balance, decreased endurance, decreased mobility, difficulty walking, decreased strength, improper body mechanics, postural dysfunction, obesity, and pain.   ACTIVITY LIMITATIONS carrying, lifting, bending, standing, stairs, transfers, and locomotion level  PARTICIPATION LIMITATIONS: meal prep, cleaning, shopping, and community activity  PERSONAL FACTORS 1-2 comorbidities: h/o falls, GAD  are also affecting patient's functional outcome.   REHAB POTENTIAL: Fair    CLINICAL DECISION MAKING: Evolving/moderate complexity  EVALUATION COMPLEXITY: Moderate   GOALS: Goals reviewed with patient? Yes  SHORT TERM GOALS: Target date: 04/26/2022  Pt will find a proper working level in pool to tolerance of knees Baseline: Goal status:Achieved   LONG TERM GOALS: Target date: 06/20/22  Able to ambulate from car to pool without rest break Baseline:  Goal status: INITIAL  2.  FOTO to meet stated goal Baseline:  Goal status: INITIAL  3.  Gross LE strength to increase by at least 10% bilaterally via hand held dynamometry testing Baseline:  Goal status: INITIAL  4.  Pt will verbalize a notable increase in participation of ADLs and more frequent mobility Baseline:  Goal status: INITIAL    PLAN: PT FREQUENCY: 2x/week  PT DURATION: 10 weeks  PLANNED INTERVENTIONS: Therapeutic exercises, Therapeutic activity, Neuromuscular re-education, Balance training, Gait training, Patient/Family education, Joint mobilization, Aquatic Therapy, Dry Needling, Electrical stimulation, Spinal mobilization, Cryotherapy, Moist heat, Taping, Manual therapy, and Re-evaluation.  PLAN FOR NEXT SESSION: continue aquatics. [address] [place], PTA 05/28/22 2:18 PM

## 2022-05-31 ENCOUNTER — Ambulatory Visit (HOSPITAL_BASED_OUTPATIENT_CLINIC_OR_DEPARTMENT_OTHER): Payer: 59 | Admitting: Physical Therapy

## 2022-05-31 ENCOUNTER — Encounter (HOSPITAL_BASED_OUTPATIENT_CLINIC_OR_DEPARTMENT_OTHER): Payer: Self-pay | Admitting: Physical Therapy

## 2022-05-31 DIAGNOSIS — M5459 Other low back pain: Secondary | ICD-10-CM | POA: Diagnosis not present

## 2022-05-31 DIAGNOSIS — M6281 Muscle weakness (generalized): Secondary | ICD-10-CM | POA: Diagnosis not present

## 2022-05-31 DIAGNOSIS — M25562 Pain in left knee: Secondary | ICD-10-CM | POA: Diagnosis not present

## 2022-05-31 DIAGNOSIS — M25561 Pain in right knee: Secondary | ICD-10-CM | POA: Diagnosis not present

## 2022-05-31 DIAGNOSIS — G8929 Other chronic pain: Secondary | ICD-10-CM | POA: Diagnosis not present

## 2022-05-31 DIAGNOSIS — R262 Difficulty in walking, not elsewhere classified: Secondary | ICD-10-CM | POA: Diagnosis not present

## 2022-05-31 NOTE — Therapy (Signed)
OUTPATIENT PHYSICAL THERAPY THORACOLUMBAR    Patient Name: Jennifer [name] MRN: [id number] DOB:Sep 29, 1976, 46 y.o., female Today's Date: 05/31/2022   PT End of Session - 05/31/22 1445     Visit Number 7    Number of Visits 21    Date for PT Re-Evaluation 06/18/22    Authorization Type [employer]    PT Start Time 1440    PT Stop Time 1510    PT Time Calculation (min) 30 min    Activity Tolerance Other (comment)    Behavior During Therapy [facility] for tasks assessed/performed              Past Medical History:  Diagnosis Date   Abnormal vaginal Pap smear    Acid reflux    Alopecia    Anxiety    Arthritis    Depression    Diabetes mellitus without complication (HCC)    prediabetic, takes no meds   Diverticulosis    Family history of diabetes mellitus    GERD (gastroesophageal reflux disease)    Hemorrhoids    History of recurrent UTIs    Hypertension    Obesity    Past Surgical History:  Procedure Laterality Date   APPENDECTOMY     COLONOSCOPY N/A 03/13/2014   Dr. Jena Gauss- grade 3 hemorrhoids, colonic diverticulosis bx= benign lymphoid polyp   COLONOSCOPY WITH PROPOFOL N/A 01/26/2021   Procedure: COLONOSCOPY WITH PROPOFOL;  Surgeon: Corbin Ade, MD;  [facility];  Service: Endoscopy;  Laterality: N/A;  AM   KNEE ARTHROSCOPY     right   POLYPECTOMY  01/26/2021   Procedure: POLYPECTOMY;  Surgeon: Corbin Ade, MD;  [facility];  Service: Endoscopy;;   Patient Active Problem List   Diagnosis Date Noted   Colovesical fistula 12/08/2021    Class: Question of   Osteoarthritis of both knees 12/03/2021   Controlled diabetes mellitus type 2 with complications (HCC) 09/09/2021   Generalized arthritis 09/09/2021   Snoring 07/06/2021   Financial difficulties 05/09/2021   Reduced vision 04/03/2021   Alternating constipation and diarrhea 12/16/2020   Hematochezia 09/09/2020   Right wrist pain 07/21/2020   Unsteady gait 07/14/2020   Poor  urinary stream 05/27/2020   Depression, major, single episode, severe (HCC) 06/18/2019   Environmental allergies 06/18/2019   Candidiasis of skin 01/22/2019   Back pain 11/28/2017   Essential hypertension 03/22/2016   Diverticulosis 11/23/2015   Hemorrhoids 03/26/2015   Insomnia due to other mental disorder 07/10/2014   GAD (generalized anxiety disorder) 07/10/2014   FHx: cancer of digestive organ 02/16/2014   Piles (hemorrhoids) 02/14/2014   Abnormal finding on Pap smear, HPV DNA positive 11/29/2013   Allergic rhinitis 02/12/2013   Prediabetes 03/26/2012   Dyspepsia 03/22/2012   TUBERCULOSIS 11/02/2010   Alopecia (capitis) totalis 11/02/2010   Chronic fatigue 07/23/2009   Morbid obesity (HCC) 11/24/2007   Depression with anxiety 11/24/2007    PCP: Syliva Overman, MD  REFERRING PROVIDER: Gaynelle Adu, MD  REFERRING DIAG: E66.01 (ICD-10-CM) - Morbid (severe) obesity due to excess calories  Rationale for Evaluation and Treatment Rehabilitation  THERAPY DIAG:  Chronic pain of left knee  Chronic pain of right knee  ONSET DATE: knees 1998, back shortly after  SUBJECTIVE:  SUBJECTIVE STATEMENT: My knees feel stronger. Cant do laundry bc I need help to lift the basket. Still feel off balance.   PERTINENT HISTORY:  OA, obesity, h/o CA, GAD, h/o falls  PAIN:  Are you having pain? Yes: NPRS scale: 3 seated, 7-8 walking/10 Pain location: bil knees (Lt>Rt) Pain description: ache Aggravating factors: constant but worse with weight bearing Relieving factors: sitting    PRECAUTIONS: Fall  WEIGHT BEARING RESTRICTIONS No  FALLS:  Has patient fallen in last 6 months? No  LIVING ENVIRONMENT: Lives with: lives with their daughter Lives in: House/apartment Stairs: steps to enter home Has  following equipment at home:  walking stick  OCCUPATION: not working right now  PLOF: Independent  PATIENT GOALS decrease pain, return to exercise   OBJECTIVE:   DIAGNOSTIC FINDINGS:  Rt knee xray 12/09/21: X-rays demonstrate marked tricompartmental degenerative changes with  valgus deformity.  Overall windswept deformity Lt knee xray 12/09/21:X-rays reveal significant tricompartmental degenerative changes with varus  deformity.  There is also medial translation of the femur on the tibia.    Overall windswept deformity.   PATIENT SURVEYS:  FOTO 35 7/17: 33    POSTURE: flexed through pelvis, ant pelvic tilt, bil knee windswept to the Lt  PALPATION:  Notable cavitations in bilateral knees with MMT  LUMBAR ROM:   Able to demo trunk mobility in seated but in flexed position in standing    LOWER EXTREMITY MMT:  hand held dynamometry at eval  MMT Right eval Left eval Rt/Lt 7/17  Hip flexion 19.6 lb 18.3 28.6//31  Hip extension     Hip abduction     Hip adduction     Hip internal rotation     Hip external rotation     Knee flexion     Knee extension 24.1 28.2 27.8//35.3  Ankle dorsiflexion     Ankle plantarflexion     Ankle inversion     Ankle eversion      (Blank rows = not tested)   FUNCTIONAL TESTS:  EVAL: 5 times sit to stand: unable to stand without use of UEs SLS   unable  7/17: 5TSTS: 13s- use of Rt UE and only stood approx 50% of height SLS: able to shift weight to 3s of SLS with UE assist on PT hands  GAIT: Ambulated into clinic with antalgic pattern using walking stick    TODAY'S TREATMENT  05/31/22: See plan    PATIENT EDUCATION:  Education details: Anatomy of condition, POC, HEP, exercise form/rationale, aquatics Person educated: Patient Education method: Programmer, multimedia, Demonstration, Tactile cues, Verbal cues, and Handouts Education comprehension: verbalized understanding, returned demonstration, verbal cues required, tactile cues  required, and needs further education   HOME EXERCISE PROGRAM: [id number]  ASSESSMENT:  CLINICAL IMPRESSION: Pt arrived feeling dizzy today, stating she has only had coffee today because she just didn't feel hungry and didn't have time. Provided ritz crackers and she had some water. We completed testing today but it was not appropriate to do exercises. Overall she does show an improvement in  strength but continues to experiece severe pain. Discussed benefits of using a rollator but she is unable to afford at this time. Would like to look for a job that can accommodate her needs.    OBJECTIVE IMPAIRMENTS Abnormal gait, decreased activity tolerance, decreased balance, decreased endurance, decreased mobility, difficulty walking, decreased strength, improper body mechanics, postural dysfunction, obesity, and pain.   ACTIVITY LIMITATIONS carrying, lifting, bending, standing, stairs, transfers, and locomotion level  PARTICIPATION LIMITATIONS: meal  prep, cleaning, shopping, and community activity  PERSONAL FACTORS 1-2 comorbidities: h/o falls, GAD  are also affecting patient's functional outcome.   REHAB POTENTIAL: Fair    CLINICAL DECISION MAKING: Evolving/moderate complexity  EVALUATION COMPLEXITY: Moderate   GOALS: Goals reviewed with patient? Yes  SHORT TERM GOALS: Target date: 04/26/2022  Pt will find a proper working level in pool to tolerance of knees Baseline: Goal status:Achieved   LONG TERM GOALS: Target date: 06/20/22  Able to ambulate from car to pool without rest break Baseline: have done it before but lately getting WC due to knee pain Goal status:   2.  FOTO to meet stated goal Baseline:  Goal status: ongoing  3.  Gross LE strength to increase by at least 25% from eval bilaterally via hand held dynamometry testing Baseline:  Goal status: 10% achieved, addend 7/17   4.  Pt will verbalize a notable increase in participation of ADLs and more frequent  mobility Baseline:  Goal status: INITIAL    PLAN: PT FREQUENCY: 2x/week  PT DURATION: 10 weeks  PLANNED INTERVENTIONS: Therapeutic exercises, Therapeutic activity, Neuromuscular re-education, Balance training, Gait training, Patient/Family education, Joint mobilization, Aquatic Therapy, Dry Needling, Electrical stimulation, Spinal mobilization, Cryotherapy, Moist heat, Taping, Manual therapy, and Re-evaluation.  PLAN FOR NEXT SESSION: continue aquatics, upright posture  Bucky Grigg C. Yu Cragun PT, DPT 05/31/22 4:10 PM

## 2022-06-02 ENCOUNTER — Encounter: Payer: Self-pay | Admitting: Family Medicine

## 2022-06-04 ENCOUNTER — Encounter (HOSPITAL_BASED_OUTPATIENT_CLINIC_OR_DEPARTMENT_OTHER): Payer: Self-pay | Admitting: Physical Therapy

## 2022-06-04 ENCOUNTER — Ambulatory Visit (HOSPITAL_BASED_OUTPATIENT_CLINIC_OR_DEPARTMENT_OTHER): Payer: 59 | Admitting: Physical Therapy

## 2022-06-04 DIAGNOSIS — M6281 Muscle weakness (generalized): Secondary | ICD-10-CM | POA: Diagnosis not present

## 2022-06-04 DIAGNOSIS — M5459 Other low back pain: Secondary | ICD-10-CM | POA: Diagnosis not present

## 2022-06-04 DIAGNOSIS — R262 Difficulty in walking, not elsewhere classified: Secondary | ICD-10-CM | POA: Diagnosis not present

## 2022-06-04 DIAGNOSIS — G8929 Other chronic pain: Secondary | ICD-10-CM

## 2022-06-04 DIAGNOSIS — M25562 Pain in left knee: Secondary | ICD-10-CM | POA: Diagnosis not present

## 2022-06-04 DIAGNOSIS — M25561 Pain in right knee: Secondary | ICD-10-CM | POA: Diagnosis not present

## 2022-06-04 NOTE — Therapy (Signed)
OUTPATIENT PHYSICAL THERAPY THORACOLUMBAR    Patient Name: Jennifer [name] MRN: [id number] DOB:May 04, 1976, 46 y.o., female Today's Date: 06/04/2022   PT End of Session - 06/04/22 1312     Visit Number 8    Number of Visits 21    Date for PT Re-Evaluation 06/18/22    Authorization Type [employer]    PT Start Time 1300    PT Stop Time 1341    PT Time Calculation (min) 41 min    Behavior During Therapy [facility] for tasks assessed/performed              Past Medical History:  Diagnosis Date   Abnormal vaginal Pap smear    Acid reflux    Alopecia    Anxiety    Arthritis    Depression    Diabetes mellitus without complication (HCC)    prediabetic, takes no meds   Diverticulosis    Family history of diabetes mellitus    GERD (gastroesophageal reflux disease)    Hemorrhoids    History of recurrent UTIs    Hypertension    Obesity    Past Surgical History:  Procedure Laterality Date   APPENDECTOMY     COLONOSCOPY N/A 03/13/2014   Dr. Jena Gauss- grade 3 hemorrhoids, colonic diverticulosis bx= benign lymphoid polyp   COLONOSCOPY WITH PROPOFOL N/A 01/26/2021   Procedure: COLONOSCOPY WITH PROPOFOL;  Surgeon: Corbin Ade, MD;  [facility];  Service: Endoscopy;  Laterality: N/A;  AM   KNEE ARTHROSCOPY     right   POLYPECTOMY  01/26/2021   Procedure: POLYPECTOMY;  Surgeon: Corbin Ade, MD;  Location: [facility];  Service: Endoscopy;;   Patient Active Problem List   Diagnosis Date Noted   Colovesical fistula 12/08/2021    Class: Question of   Osteoarthritis of both knees 12/03/2021   Controlled diabetes mellitus type 2 with complications (HCC) 09/09/2021   Generalized arthritis 09/09/2021   Snoring 07/06/2021   Financial difficulties 05/09/2021   Reduced vision 04/03/2021   Alternating constipation and diarrhea 12/16/2020   Hematochezia 09/09/2020   Right wrist pain 07/21/2020   Unsteady gait 07/14/2020   Poor urinary stream 05/27/2020    Depression, major, single episode, severe (HCC) 06/18/2019   Environmental allergies 06/18/2019   Candidiasis of skin 01/22/2019   Back pain 11/28/2017   Essential hypertension 03/22/2016   Diverticulosis 11/23/2015   Hemorrhoids 03/26/2015   Insomnia due to other mental disorder 07/10/2014   GAD (generalized anxiety disorder) 07/10/2014   FHx: cancer of digestive organ 02/16/2014   Piles (hemorrhoids) 02/14/2014   Abnormal finding on Pap smear, HPV DNA positive 11/29/2013   Allergic rhinitis 02/12/2013   Prediabetes 03/26/2012   Dyspepsia 03/22/2012   TUBERCULOSIS 11/02/2010   Alopecia (capitis) totalis 11/02/2010   Chronic fatigue 07/23/2009   Morbid obesity (HCC) 11/24/2007   Depression with anxiety 11/24/2007    PCP: Syliva Overman, MD  REFERRING PROVIDER: Gaynelle Adu, MD  REFERRING DIAG: E66.01 (ICD-10-CM) - Morbid (severe) obesity due to excess calories  Rationale for Evaluation and Treatment Rehabilitation  THERAPY DIAG:  Chronic pain of left knee  Chronic pain of right knee  Other low back pain  Difficulty in walking, not elsewhere classified  ONSET DATE: knees 1998, back shortly after  SUBJECTIVE:  SUBJECTIVE STATEMENT: Pt reports she will have medicaid when [place] expires.  She is considering joining Hotel manager with daughter.  She also found outdoor pool in [place] she could go to for $5.    PERTINENT HISTORY:  OA, obesity, h/o CA, GAD, h/o falls  PAIN:  Are you having pain? Yes: NPRS scale: 4/10 Pain location: bil knees (Lt>Rt) Pain description: ache Aggravating factors: constant but worse with weight bearing Relieving factors: sitting    PRECAUTIONS: Fall  WEIGHT BEARING RESTRICTIONS No  FALLS:  Has patient fallen in last 6 months? No  LIVING ENVIRONMENT: Lives  with: lives with their daughter Lives in: House/apartment Stairs: steps to enter home Has following equipment at home:  walking stick / SPC  OCCUPATION: not working right now  PLOF: Independent  PATIENT GOALS decrease pain, return to exercise   OBJECTIVE:   DIAGNOSTIC FINDINGS:  Rt knee xray 12/09/21: X-rays demonstrate marked tricompartmental degenerative changes with  valgus deformity.  Overall windswept deformity Lt knee xray 12/09/21:X-rays reveal significant tricompartmental degenerative changes with varus  deformity.  There is also medial translation of the femur on the tibia.    Overall windswept deformity.   PATIENT SURVEYS:  FOTO 35 7/17: 33    POSTURE: flexed through pelvis, ant pelvic tilt, bil knee windswept to the Lt  PALPATION:  Notable cavitations in bilateral knees with MMT  LUMBAR ROM:   Able to demo trunk mobility in seated but in flexed position in standing    LOWER EXTREMITY MMT:  hand held dynamometry at eval  MMT Right eval Left eval Rt/Lt 7/17  Hip flexion 19.6 lb 18.3 28.6//31  Hip extension     Hip abduction     Hip adduction     Hip internal rotation     Hip external rotation     Knee flexion     Knee extension 24.1 28.2 27.8//35.3  Ankle dorsiflexion     Ankle plantarflexion     Ankle inversion     Ankle eversion      (Blank rows = not tested)   FUNCTIONAL TESTS:  EVAL: 5 times sit to stand: unable to stand without use of UEs SLS   unable  7/17: 5TSTS: 13s- use of Rt UE and only stood approx 50% of height SLS: able to shift weight to 3s of SLS with UE assist on PT hands  GAIT: Ambulated into clinic with antalgic pattern using walking stick    TODAY'S TREATMENT  06/04/22 Pt seen for aquatic therapy today.  Treatment took place in water 3.25-4.5 ft in depth at the [facility] pool. Temp of water was 91.  Pt entered/exited the pool via stairs independently with step-to pattern with bilat rail.  Holding  yellow noodle at 4.25 ft forward, back and sidestepping.   Straddling yellow noodle: cycling, add/abd Side step with arm abdct / add  Forward walking kicks High knee marching forward  Monster walk forward 3 way leg kick (alternating LEs) holding yellow noodle Return to forward/ backward walking for rest and recovery Squoodle pull down x 15 with cues for form Seated on bench in water: LAQ R/L STS from bench in water with feet on blue step x 10, cues for hip hinge and knee flexion At 3.25 ft: Forward step ups on 5" step in water with UE on wall x 5 with R,x 1 with L  Returned to deep water for walking  Holding wall:  hip ext x 10 each   Pt requires the buoyancy and  hydrostatic pressure of water for support, and to offload joints by unweighting joint load by at least 50 % in navel deep water and by at least 75-80% in chest to neck deep water.  Viscosity of the water is needed for resistance of strengthening. Water current perturbations provides challenge to standing balance requiring increased core activation.  PATIENT EDUCATION:  Education details: exercise form/rationale, aquatics Person educated: Patient Education method: Programmer, multimedia, Demonstration, Actor cues, Verbal cues, and Handouts Education comprehension: verbalized understanding, returned demonstration, verbal cues required, tactile cues required, and needs further education   HOME EXERCISE PROGRAM: [id number]  ASSESSMENT:  CLINICAL IMPRESSION: Pt reported some pain with forward walking kicks; improved tolerance with decreased speed and height of kicks (in 4.5 Ft of water).  She is unable to ascend 5" step with LLE in 3.25 ft due to pain and difficulty bearing weight into LLE with knee flexed.  Overall she is reporting good relief of pain while in water and is able to come to more upright position with trunk (with minor cues).  Progressing gradually towards goals.     OBJECTIVE IMPAIRMENTS Abnormal gait, decreased activity  tolerance, decreased balance, decreased endurance, decreased mobility, difficulty walking, decreased strength, improper body mechanics, postural dysfunction, obesity, and pain.   ACTIVITY LIMITATIONS carrying, lifting, bending, standing, stairs, transfers, and locomotion level  PARTICIPATION LIMITATIONS: meal prep, cleaning, shopping, and community activity  PERSONAL FACTORS 1-2 comorbidities: h/o falls, GAD  are also affecting patient's functional outcome.   REHAB POTENTIAL: Fair    CLINICAL DECISION MAKING: Evolving/moderate complexity  EVALUATION COMPLEXITY: Moderate   GOALS: Goals reviewed with patient? Yes  SHORT TERM GOALS: Target date: 04/26/2022  Pt will find a proper working level in pool to tolerance of knees Baseline: Goal status:Achieved   LONG TERM GOALS: Target date: 06/20/22  Able to ambulate from car to pool without rest break Baseline: have done it before but lately getting WC due to knee pain Goal status:   2.  FOTO to meet stated goal Baseline:  Goal status: ongoing  3.  Gross LE strength to increase by at least 25% from eval bilaterally via hand held dynamometry testing Baseline:  Goal status: 10% achieved, addend 7/17   4.  Pt will verbalize a notable increase in participation of ADLs and more frequent mobility Baseline:  Goal status: INITIAL    PLAN: PT FREQUENCY: 2x/week  PT DURATION: 10 weeks  PLANNED INTERVENTIONS: Therapeutic exercises, Therapeutic activity, Neuromuscular re-education, Balance training, Gait training, Patient/Family education, Joint mobilization, Aquatic Therapy, Dry Needling, Electrical stimulation, Spinal mobilization, Cryotherapy, Moist heat, Taping, Manual therapy, and Re-evaluation.  PLAN FOR NEXT SESSION: continue aquatics, upright posture  Mayer Camel, PTA 06/04/22 3:31 PM

## 2022-06-07 ENCOUNTER — Ambulatory Visit (HOSPITAL_BASED_OUTPATIENT_CLINIC_OR_DEPARTMENT_OTHER): Payer: 59 | Admitting: Physical Therapy

## 2022-06-11 ENCOUNTER — Ambulatory Visit (HOSPITAL_BASED_OUTPATIENT_CLINIC_OR_DEPARTMENT_OTHER): Payer: 59 | Admitting: Physical Therapy

## 2022-06-11 ENCOUNTER — Ambulatory Visit: Payer: 59 | Admitting: Licensed Clinical Social Worker

## 2022-06-11 ENCOUNTER — Encounter (HOSPITAL_BASED_OUTPATIENT_CLINIC_OR_DEPARTMENT_OTHER): Payer: Self-pay | Admitting: Physical Therapy

## 2022-06-11 DIAGNOSIS — I10 Essential (primary) hypertension: Secondary | ICD-10-CM

## 2022-06-11 DIAGNOSIS — F5105 Insomnia due to other mental disorder: Secondary | ICD-10-CM

## 2022-06-11 DIAGNOSIS — F411 Generalized anxiety disorder: Secondary | ICD-10-CM

## 2022-06-11 DIAGNOSIS — M170 Bilateral primary osteoarthritis of knee: Secondary | ICD-10-CM

## 2022-06-11 DIAGNOSIS — E118 Type 2 diabetes mellitus with unspecified complications: Secondary | ICD-10-CM

## 2022-06-11 DIAGNOSIS — R262 Difficulty in walking, not elsewhere classified: Secondary | ICD-10-CM

## 2022-06-11 DIAGNOSIS — Z599 Problem related to housing and economic circumstances, unspecified: Secondary | ICD-10-CM

## 2022-06-11 DIAGNOSIS — M25562 Pain in left knee: Secondary | ICD-10-CM

## 2022-06-11 DIAGNOSIS — G8929 Other chronic pain: Secondary | ICD-10-CM

## 2022-06-11 DIAGNOSIS — M5459 Other low back pain: Secondary | ICD-10-CM

## 2022-06-11 NOTE — Chronic Care Management (AMB) (Signed)
Care Management Clinical Social Work Note  06/11/2022 Name: Jennifer [name] MRN: [id number] DOB: [birth year]  Jennifer [name] is a 46 y.o. year old female who is a primary care patient of Jennifer Perches, MD.  The Care Management team was consulted for assistance with chronic disease management and coordination needs.  Engaged with patient by telephone for follow up visit in response to provider referral for social work chronic care management and care coordination services  Consent to Services:  Jennifer [name] was given information about Care Management services today including:  Care Management services includes personalized support from designated clinical staff supervised by her physician, including individualized plan of care and coordination with other care providers 24/7 contact phone numbers for assistance for urgent and routine care needs. The patient may stop case management services at any time by phone call to the office staff.  Patient agreed to services and consent obtained.   Assessment: Review of patient past medical history, allergies, medications, and health status, including review of relevant consultants reports was performed today as part of a comprehensive evaluation and provision of chronic care management and care coordination services.  SDOH (Social Determinants of Health) assessments and interventions performed:  SDOH Interventions    Flowsheet Row Most Recent Value  SDOH Interventions   Physical Activity Interventions Other (Comments)  [client has difficulty walking sometimes. Client has difficulty standing sometimes]  Stress Interventions Provide Counseling  [client has stress related to difficulty sleeping. client has stress related to financial issues.]  Depression Interventions/Treatment  Counseling, Medication        Advanced Directives Status: See [employer] application for related entries.  Care Plan  No Known Allergies  Outpatient Encounter Medications  as of 06/11/2022  Medication Sig   Acetaminophen-Codeine 300-30 MG tablet Take 1 tablet by mouth every 8 (eight) hours as needed.   acyclovir (ZOVIRAX) 800 MG tablet Take 1 tablet (800 mg total) by mouth 3 (three) times daily.   amLODipine (NORVASC) 5 MG tablet TAKE 1 TABLET (5 MG TOTAL) BY MOUTH DAILY.   amoxicillin (AMOXIL) 875 MG tablet Take 1 tablet (875 mg total) by mouth 2 (two) times daily.   busPIRone (BUSPAR) 15 MG tablet TAKE 1 TABLET BY MOUTH THREE TIMES A DAY   clotrimazole-betamethasone (LOTRISONE) cream APPLY TO AFFECTED AREA TWICE A DAY   fluticasone (FLONASE) 50 MCG/ACT nasal spray Place 2 sprays into both nostrils daily. (Patient taking differently: Place 2 sprays into both nostrils daily as needed for allergies.)   hydrOXYzine (ATARAX/VISTARIL) 50 MG tablet TAKE ONE TABLET AT BEDTIME FOR SLEEP (Patient taking differently: Take 50 mg by mouth at bedtime as needed (sleep).)   megestrol (MEGACE) 40 MG tablet TAKE 2 TABLETS BY MOUTH EVERY DAY   meloxicam (MOBIC) 15 MG tablet Take 15 mg by mouth daily.   oxybutynin (DITROPAN-XL) 10 MG 24 hr tablet TAKE 1 TABLET BY MOUTH EVERYDAY AT BEDTIME   promethazine-dextromethorphan (PROMETHAZINE-DM) 6.25-15 MG/5ML syrup Take 5 mLs by mouth 3 (three) times daily as needed for cough.   Semaglutide-Weight Management 1.7 MG/0.75ML SOAJ Inject 1.7 mg into the skin once a week.   triamterene-hydrochlorothiazide (MAXZIDE) 75-50 MG tablet TAKE 1 TABLET BY MOUTH EVERY DAY   venlafaxine XR (EFFEXOR-XR) 150 MG 24 hr capsule TAKE 1 CAPSULE BY MOUTH DAILY WITH BREAKFAST.   No facility-administered encounter medications on file as of 06/11/2022.    Patient Active Problem List   Diagnosis Date Noted   Colovesical fistula 12/08/2021    Class: Question of  Osteoarthritis of both knees 12/03/2021   Controlled diabetes mellitus type 2 with complications (HCC) 09/09/2021   Generalized arthritis 09/09/2021   Snoring 07/06/2021   Financial difficulties  05/09/2021   Reduced vision 04/03/2021   Alternating constipation and diarrhea 12/16/2020   Hematochezia 09/09/2020   Right wrist pain 07/21/2020   Unsteady gait 07/14/2020   Poor urinary stream 05/27/2020   Depression, major, single episode, severe (HCC) 06/18/2019   Environmental allergies 06/18/2019   Candidiasis of skin 01/22/2019   Back pain 11/28/2017   Essential hypertension 03/22/2016   Diverticulosis 11/23/2015   Hemorrhoids 03/26/2015   Insomnia due to other mental disorder 07/10/2014   GAD (generalized anxiety disorder) 07/10/2014   FHx: cancer of digestive organ 02/16/2014   Piles (hemorrhoids) 02/14/2014   Abnormal finding on Pap smear, HPV DNA positive 11/29/2013   Allergic rhinitis 02/12/2013   Prediabetes 03/26/2012   Dyspepsia 03/22/2012   TUBERCULOSIS 11/02/2010   Alopecia (capitis) totalis 11/02/2010   Chronic fatigue 07/23/2009   Morbid obesity (HCC) 11/24/2007   Depression with anxiety 11/24/2007    Conditions to be addressed/monitored: monitor client management of depression issues. Monitor client management of anxiety issues  Care Plan : LCSW Care Plan  Updates made by Isaiah Blakes, LCSW since 06/11/2022 12:00 AM     Problem: Emotional Distress      Goal: Emotional Health Supported.  Manage Depression issues. Manage Anxiety issues. Manage financial needs   Start Date: 02/22/2022  Expected End Date: 08/05/2022  This Visit's Progress: On track  Recent Progress: Not on track  Priority: Medium  Note:   Current Barriers:  Financial barriers Depression issues Anxiety issues Suicidal Ideation/Homicidal Ideation: No  Clinical Social Work Goal(s):  patient will work with SW  in next 30 days by telephone or in person to reduce or manage symptoms related to depression or anxiety  Client to attend scheduled medical appointments in next 30 days Client to communicate with RNCM as needed for nursing support Client will talk with SW in next 30 days  about financial needs of client  Interventions: 1:1 collaboration with Jennifer Perches, MD regarding development and update of comprehensive plan of care as evidenced by provider attestation and co-signature Discussed client needs with Leda Hollomon Discussed food needs of client. She said she receives food stamps benefit monthly. She said food stamps benefit is helpful to her . Reviewed social support of client. She talks daily with her nephew , Domingo Dimes . Brycelynn said she resides at her home currently. She also said she stays occasionally at home of her relative.  LCSW talked with client about  importance of social contact and social support from others, such as family members. She said she talks daily via phone with her nephew. She said she talks often with her cousins. She said the closest cousin lives about one hour away  Discussed financial challenges. She has applied for Disability; however, her application was denied. She appealed decision. She is now waiting on response related to her appeal. She is concerned about financial stressors and does have difficulty paying her bills. She said she spoke with Disability representative recently and was encouraged to set up appointment with counselor at local counseling agency. She said she had phone number of counseling agency and plans to call agency soon to set up counseling appointme Provided counseling support for client Reviewed pain issues. She spoke of back pain and knee pain. She spoke of difficulty in standing. Encouraged client to call RNCM as needed  in next 30 days for nursing support for client Discussed her insurance coverage. She said she has [employer] but that it will end on 07/15/22. She spoke with representative at DSS in [facility] and was told she has full Medicaid. She did not realize she had full Medicaid.   Discussed exercise of client. She said that with [employer] she can go 2 times weekly for aquatic (pool)  therapy and that this is very helpful. She will be able to go 2 times a week until 07/15/22 for aquatic therapy with [employer]. After 07/15/22 she said Medicaid will pay for her to go for 6 sessions of aquatic therapy.  LCSW congratulated client on participating in this exercise program and talked with her about completing aquatic therapy sessions.  Patient Self Care Activities:  Attends medical appointments  Patient Coping Strengths:  Has emotional support from nephew, Briany Orrico  Patient Self Care Deficits:  Financial challenges Sleep issues Depression issues Anxiety issues  Patient Goals:  - spend time or talk with others at least 2 to 3 times per week - practice relaxation or meditation daily - keep a calendar with appointment dates  Follow Up Plan: LCSW to call client on 07/08/22 at 10:00 AM     Kelton Pillar.Maresha Anastos MSW, LCSW Licensed Visual merchandiser [facility] Care Management [phone]

## 2022-06-11 NOTE — Patient Instructions (Signed)
Visit Information  Thank you for taking time to visit with me today. Please don't hesitate to contact me if I can be of assistance to you before our next scheduled telephone appointment.  Following are the goals we discussed today:   Our next appointment is by telephone on 07/08/22 at 10:00 AM   Please call the care guide team at [phone] if you need to cancel or reschedule your appointment.   If you are experiencing a Mental Health or [place] or need someone to talk to, please call the [facility]: [phone]   Following is a copy of your full plan of care:  Care Plan : Jennifer [name]  Updates made by Jennifer Cabal, LCSW since 06/11/2022 12:00 AM     Problem: Emotional Distress      Goal: Emotional Health Supported.  Manage Depression issues. Manage Anxiety issues. Manage financial needs   Start Date: 02/22/2022  Expected End Date: 08/05/2022  This Visit's Progress: On track  Recent Progress: Not on track  Priority: Medium  Note:   Current Barriers:  Financial barriers Depression issues Anxiety issues Suicidal Ideation/Homicidal Ideation: No  Clinical Social Work Goal(s):  patient will work with SW  in next 30 days by telephone or in person to reduce or manage symptoms related to depression or anxiety  Client to attend scheduled medical appointments in next 30 days Client to communicate with RNCM as needed for nursing support Client will talk with SW in next 30 days about financial needs of client  Interventions: 1:1 collaboration with Jennifer Helper, MD regarding development and update of comprehensive plan of care as evidenced by provider attestation and co-signature Discussed client needs with Jennifer [name] Discussed food needs of client. She said she receives food stamps benefit monthly. She said food stamps benefit is helpful to her . Reviewed social support of client. She talks daily with her nephew , Jennifer [name] . Jennifer [name] said  she resides at her home currently. She also said she stays occasionally at home of her relative.  LCSW talked with client about  importance of social contact and social support from others, such as family members. She said she talks daily via phone with her nephew. She said she talks often with her cousins. She said the closest cousin lives about one hour away  Discussed financial challenges. She has applied for Disability; however, her application was denied. She appealed decision. She is now waiting on response related to her appeal. She is concerned about financial stressors and does have difficulty paying her bills. She said she spoke with Disability representative recently and was encouraged to set up appointment with counselor at local counseling agency. She said she had phone number of counseling agency and plans to call agency soon to set up counseling appointme Provided counseling support for client Reviewed pain issues. She spoke of back pain and knee pain. She spoke of difficulty in standing. Encouraged client to call RNCM as needed in next 30 days for nursing support for client Discussed her insurance coverage. She said she has [employer] but that it will end on 07/15/22. She spoke with representative at [place] in [place] and was told she has full Medicaid. She did not realize she had full Medicaid.   Discussed exercise of client. She said that with [employer] she can go 2 times weekly for aquatic (pool) therapy and that this is very helpful. She will be able to go 2 times a  week until 07/15/22 for aquatic therapy with [employer]. After 07/15/22 she said Medicaid will pay for her to go for 6 sessions of aquatic therapy.  LCSW congratulated client on participating in this exercise program and talked with her about completing aquatic therapy sessions.  Patient Self Care Activities:  Attends medical appointments  Patient Coping Strengths:  Has emotional support from  nephew, Jennifer [name]  Patient Self Care Deficits:  Financial challenges Sleep issues Depression issues Anxiety issues  Patient Goals:  - spend time or talk with others at least 2 to 3 times per week - practice relaxation or meditation daily - keep a calendar with appointment dates  Follow Up Plan: LCSW to call client on 07/08/22 at 10:00 AM     Jennifer [name] was given information about Care Management services by the embedded care coordination team including:  Care Management services include personalized support from designated clinical staff supervised by her physician, including individualized plan of care and coordination with other care providers 24/7 contact phone numbers for assistance for urgent and routine care needs. The patient may stop CCM services at any time (effective at the end of the month) by phone call to the office staff.  Patient agreed to services and verbal consent obtained.   Norva Riffle.Harmonii Karle MSW, [place] representative [facility] Care Management [phone]

## 2022-06-11 NOTE — Therapy (Signed)
OUTPATIENT PHYSICAL THERAPY THORACOLUMBAR    Patient Name: Jennifer [name] MRN: [id number] DOB:03-May-1976, 46 y.o., female Today's Date: 06/11/2022   PT End of Session - 06/11/22 1042     Visit Number 9    Number of Visits 21    Date for PT Re-Evaluation 06/18/22    Authorization Type [employer]    PT Start Time 1035    PT Stop Time 1115    PT Time Calculation (min) 40 min    Activity Tolerance Patient tolerated treatment well;Patient limited by pain    Behavior During Therapy [facility] for tasks assessed/performed              Past Medical History:  Diagnosis Date   Abnormal vaginal Pap smear    Acid reflux    Alopecia    Anxiety    Arthritis    Depression    Diabetes mellitus without complication (HCC)    prediabetic, takes no meds   Diverticulosis    Family history of diabetes mellitus    GERD (gastroesophageal reflux disease)    Hemorrhoids    History of recurrent UTIs    Hypertension    Obesity    Past Surgical History:  Procedure Laterality Date   APPENDECTOMY     COLONOSCOPY N/A 03/13/2014   Dr. Jena Gauss- grade 3 hemorrhoids, colonic diverticulosis bx= benign lymphoid polyp   COLONOSCOPY WITH PROPOFOL N/A 01/26/2021   Procedure: COLONOSCOPY WITH PROPOFOL;  Surgeon: Corbin Ade, MD;  [facility]: [facility];  Service: Endoscopy;  Laterality: N/A;  AM   KNEE ARTHROSCOPY     right   POLYPECTOMY  01/26/2021   Procedure: POLYPECTOMY;  Surgeon: Corbin Ade, MD;  Location: [facility];  Service: Endoscopy;;   Patient Active Problem List   Diagnosis Date Noted   Colovesical fistula 12/08/2021    Class: Question of   Osteoarthritis of both knees 12/03/2021   Controlled diabetes mellitus type 2 with complications (HCC) 09/09/2021   Generalized arthritis 09/09/2021   Snoring 07/06/2021   Financial difficulties 05/09/2021   Reduced vision 04/03/2021   Alternating constipation and diarrhea 12/16/2020   Hematochezia 09/09/2020   Right wrist pain  07/21/2020   Unsteady gait 07/14/2020   Poor urinary stream 05/27/2020   Depression, major, single episode, severe (HCC) 06/18/2019   Environmental allergies 06/18/2019   Candidiasis of skin 01/22/2019   Back pain 11/28/2017   Essential hypertension 03/22/2016   Diverticulosis 11/23/2015   Hemorrhoids 03/26/2015   Insomnia due to other mental disorder 07/10/2014   GAD (generalized anxiety disorder) 07/10/2014   FHx: cancer of digestive organ 02/16/2014   Piles (hemorrhoids) 02/14/2014   Abnormal finding on Pap smear, HPV DNA positive 11/29/2013   Allergic rhinitis 02/12/2013   Prediabetes 03/26/2012   Dyspepsia 03/22/2012   TUBERCULOSIS 11/02/2010   Alopecia (capitis) totalis 11/02/2010   Chronic fatigue 07/23/2009   Morbid obesity (HCC) 11/24/2007   Depression with anxiety 11/24/2007    PCP: Syliva Overman, MD  REFERRING PROVIDER: Gaynelle Adu, MD  REFERRING DIAG: E66.01 (ICD-10-CM) - Morbid (severe) obesity due to excess calories  Rationale for Evaluation and Treatment Rehabilitation  THERAPY DIAG:  Chronic pain of left knee  Chronic pain of right knee  Other low back pain  Difficulty in walking, not elsewhere classified  ONSET DATE: knees 1998, back shortly after  SUBJECTIVE:  SUBJECTIVE STATEMENT: Pt reports medicaid will not cover bariatric surgery    PERTINENT HISTORY:  OA, obesity, h/o CA, GAD, h/o falls  PAIN:  Are you having pain? Yes: NPRS scale: 4/10 Pain location: bil knees (Lt>Rt) Pain description: ache Aggravating factors: constant but worse with weight bearing Relieving factors: sitting    PRECAUTIONS: Fall  WEIGHT BEARING RESTRICTIONS No  FALLS:  Has patient fallen in last 6 months? No  LIVING ENVIRONMENT: Lives with: lives with their  daughter Lives in: House/apartment Stairs: steps to enter home Has following equipment at home:  walking stick / SPC  OCCUPATION: not working right now  PLOF: Independent  PATIENT GOALS decrease pain, return to exercise   OBJECTIVE:   DIAGNOSTIC FINDINGS:  Rt knee xray 12/09/21: X-rays demonstrate marked tricompartmental degenerative changes with  valgus deformity.  Overall windswept deformity Lt knee xray 12/09/21:X-rays reveal significant tricompartmental degenerative changes with varus  deformity.  There is also medial translation of the femur on the tibia.    Overall windswept deformity.   PATIENT SURVEYS:  FOTO 35 7/17: 33    POSTURE: flexed through pelvis, ant pelvic tilt, bil knee windswept to the Lt  PALPATION:  Notable cavitations in bilateral knees with MMT  LUMBAR ROM:   Able to demo trunk mobility in seated but in flexed position in standing    LOWER EXTREMITY MMT:  hand held dynamometry at eval  MMT Right eval Left eval Rt/Lt 7/17  Hip flexion 19.6 lb 18.3 28.6//31  Hip extension     Hip abduction     Hip adduction     Hip internal rotation     Hip external rotation     Knee flexion     Knee extension 24.1 28.2 27.8//35.3  Ankle dorsiflexion     Ankle plantarflexion     Ankle inversion     Ankle eversion      (Blank rows = not tested)   FUNCTIONAL TESTS:  EVAL: 5 times sit to stand: unable to stand without use of UEs SLS   unable  7/17: 5TSTS: 13s- use of Rt UE and only stood approx 50% of height SLS: able to shift weight to 3s of SLS with UE assist on PT hands  GAIT: Ambulated into clinic with antalgic pattern using walking stick    TODAY'S TREATMENT  06/04/22 Pt seen for aquatic therapy today.  Treatment took place in water 3.25-4.5 ft in depth at the [facility] pool. Temp of water was 91.  Pt entered/exited the pool via stairs independently with step-to pattern with bilat rail.  Holding yellow noodle at 4.25 ft  forward, back and sidestepping.   Straddling yellow noodle: cycling, add/abd Side step with arm abdct / add  Forward walking kicks High knee marching forward  3 way leg kick (alternating LEs) holding yellow noodle Squoodle pull down x 15 with cues for form Seated on bench in water: LAQ R/L STS from bench in water with feet on blue step x 10, cues for hip hinge and knee flexion re Holding wall:  hip ext; add/abd x 10 each  Forward/ backward walking for rest and recovery between exercises  Pt requires the buoyancy and hydrostatic pressure of water for support, and to offload joints by unweighting joint load by at least 50 % in navel deep water and by at least 75-80% in chest to neck deep water.  Viscosity of the water is needed for resistance of strengthening. Water current perturbations provides challenge to standing balance requiring  increased core activation.  PATIENT EDUCATION:  Education details: exercise form/rationale, aquatics Person educated: Patient Education method: Programmer, multimedia, Facilities manager, Actor cues, Verbal cues, and Handouts Education comprehension: verbalized understanding, returned demonstration, verbal cues required, tactile cues required, and needs further education   HOME EXERCISE PROGRAM: [id number]  ASSESSMENT:  CLINICAL IMPRESSION: Pt reports increased toleration to standing washing dishes and completing other chores in home. She does appear to have subsequent pain response. Moderate cues needed for posture with exercises.  Encouraged stabilization of core throughout. Bilat LE continue to be limited by gross valgus (left) and varus (right) deformities which limited toleration to activity.  She will be changing insurances to medicaid at the beginning of Sept.  She reports she needs to find new bariatric MD as hers does not take medicaid. Progression is slow   OBJECTIVE IMPAIRMENTS Abnormal gait, decreased activity tolerance, decreased balance, decreased endurance,  decreased mobility, difficulty walking, decreased strength, improper body mechanics, postural dysfunction, obesity, and pain.   ACTIVITY LIMITATIONS carrying, lifting, bending, standing, stairs, transfers, and locomotion level  PARTICIPATION LIMITATIONS: meal prep, cleaning, shopping, and community activity  PERSONAL FACTORS 1-2 comorbidities: h/o falls, GAD  are also affecting patient's functional outcome.   REHAB POTENTIAL: Fair    CLINICAL DECISION MAKING: Evolving/moderate complexity  EVALUATION COMPLEXITY: Moderate   GOALS: Goals reviewed with patient? Yes  SHORT TERM GOALS: Target date: 04/26/2022  Pt will find a proper working level in pool to tolerance of knees Baseline: Goal status:Achieved   LONG TERM GOALS: Target date: 06/20/22  Able to ambulate from car to pool without rest break Baseline: have done it before but lately getting WC due to knee pain Goal status:   2.  FOTO to meet stated goal Baseline:  Goal status: ongoing  3.  Gross LE strength to increase by at least 25% from eval bilaterally via hand held dynamometry testing Baseline:  Goal status: 10% achieved, addend 7/17   4.  Pt will verbalize a notable increase in participation of ADLs and more frequent mobility Baseline:  Goal status: INITIAL    PLAN: PT FREQUENCY: 2x/week  PT DURATION: 10 weeks  PLANNED INTERVENTIONS: Therapeutic exercises, Therapeutic activity, Neuromuscular re-education, Balance training, Gait training, Patient/Family education, Joint mobilization, Aquatic Therapy, Dry Needling, Electrical stimulation, Spinal mobilization, Cryotherapy, Moist heat, Taping, Manual therapy, and Re-evaluation.  PLAN FOR NEXT SESSION: continue aquatics, upright posture  Corrie Dandy (Frankie) Hailey Miles MPT 06/11/22 10:59 AM

## 2022-06-12 ENCOUNTER — Other Ambulatory Visit: Payer: Self-pay | Admitting: Family Medicine

## 2022-06-14 ENCOUNTER — Encounter (HOSPITAL_BASED_OUTPATIENT_CLINIC_OR_DEPARTMENT_OTHER): Payer: Self-pay | Admitting: Physical Therapy

## 2022-06-14 ENCOUNTER — Ambulatory Visit (HOSPITAL_BASED_OUTPATIENT_CLINIC_OR_DEPARTMENT_OTHER): Payer: 59 | Admitting: Physical Therapy

## 2022-06-14 DIAGNOSIS — M5459 Other low back pain: Secondary | ICD-10-CM

## 2022-06-14 DIAGNOSIS — G8929 Other chronic pain: Secondary | ICD-10-CM

## 2022-06-14 DIAGNOSIS — M25562 Pain in left knee: Secondary | ICD-10-CM | POA: Diagnosis not present

## 2022-06-14 NOTE — Therapy (Signed)
OUTPATIENT PHYSICAL THERAPY THORACOLUMBAR  Progress Note Reporting Period 04/05/22 to 06/14/22  See note below for Objective Data and Assessment of Progress/Goals.      Patient Name: Jennifer [name] MRN: [id number] DOB:27-Mar-1976, 46 y.o., female Today's Date: 06/14/2022   PT End of Session - 06/14/22 1209     Visit Number 10    Number of Visits 21    Date for PT Re-Evaluation 07/12/22    Authorization Type [employer]    PT Start Time 1200    PT Stop Time 1300    PT Time Calculation (min) 60 min    Activity Tolerance Patient tolerated treatment well;Patient limited by pain    Behavior During Therapy [facility] for tasks assessed/performed              Past Medical History:  Diagnosis Date   Abnormal vaginal Pap smear    Acid reflux    Alopecia    Anxiety    Arthritis    Depression    Diabetes mellitus without complication (HCC)    prediabetic, takes no meds   Diverticulosis    Family history of diabetes mellitus    GERD (gastroesophageal reflux disease)    Hemorrhoids    History of recurrent UTIs    Hypertension    Obesity    Past Surgical History:  Procedure Laterality Date   APPENDECTOMY     COLONOSCOPY N/A 03/13/2014   Dr. Jena Gauss- grade 3 hemorrhoids, colonic diverticulosis bx= benign lymphoid polyp   COLONOSCOPY WITH PROPOFOL N/A 01/26/2021   Procedure: COLONOSCOPY WITH PROPOFOL;  Surgeon: Corbin Ade, MD;  [facility];  Service: Endoscopy;  Laterality: N/A;  AM   KNEE ARTHROSCOPY     right   POLYPECTOMY  01/26/2021   Procedure: POLYPECTOMY;  Surgeon: Corbin Ade, MD;  [facility];  Service: Endoscopy;;   Patient Active Problem List   Diagnosis Date Noted   Colovesical fistula 12/08/2021    Class: Question of   Osteoarthritis of both knees 12/03/2021   Controlled diabetes mellitus type 2 with complications (HCC) 09/09/2021   Generalized arthritis 09/09/2021   Snoring 07/06/2021   Financial difficulties 05/09/2021    Reduced vision 04/03/2021   Alternating constipation and diarrhea 12/16/2020   Hematochezia 09/09/2020   Right wrist pain 07/21/2020   Unsteady gait 07/14/2020   Poor urinary stream 05/27/2020   Depression, major, single episode, severe (HCC) 06/18/2019   Environmental allergies 06/18/2019   Candidiasis of skin 01/22/2019   Back pain 11/28/2017   Essential hypertension 03/22/2016   Diverticulosis 11/23/2015   Hemorrhoids 03/26/2015   Insomnia due to other mental disorder 07/10/2014   GAD (generalized anxiety disorder) 07/10/2014   FHx: cancer of digestive organ 02/16/2014   Piles (hemorrhoids) 02/14/2014   Abnormal finding on Pap smear, HPV DNA positive 11/29/2013   Allergic rhinitis 02/12/2013   Prediabetes 03/26/2012   Dyspepsia 03/22/2012   TUBERCULOSIS 11/02/2010   Alopecia (capitis) totalis 11/02/2010   Chronic fatigue 07/23/2009   Morbid obesity (HCC) 11/24/2007   Depression with anxiety 11/24/2007    PCP: Syliva Overman, MD  REFERRING PROVIDER: Gaynelle Adu, MD  REFERRING DIAG: E66.01 (ICD-10-CM) - Morbid (severe) obesity due to excess calories  Rationale for Evaluation and Treatment Rehabilitation  THERAPY DIAG:  Chronic pain of left knee  Chronic pain of right knee  Other low back pain  ONSET DATE: knees 1998, back shortly after  SUBJECTIVE:  SUBJECTIVE STATEMENT: "Knees are the same, not hurting maybe as bad today"   PERTINENT HISTORY:  OA, obesity, h/o CA, GAD, h/o falls  PAIN:  Are you having pain? Yes: NPRS scale: 3/10 Pain location: bil knees (Lt>Rt) Pain description: ache Aggravating factors: constant but worse with weight bearing Relieving factors: sitting    PRECAUTIONS: Fall  WEIGHT BEARING RESTRICTIONS No  FALLS:  Has patient fallen in last 6  months? No  LIVING ENVIRONMENT: Lives with: lives with their daughter Lives in: House/apartment Stairs: steps to enter home Has following equipment at home:  walking stick / SPC  OCCUPATION: not working right now  PLOF: Independent  PATIENT GOALS decrease pain, return to exercise   OBJECTIVE:   DIAGNOSTIC FINDINGS:  Rt knee xray 12/09/21: X-rays demonstrate marked tricompartmental degenerative changes with  valgus deformity.  Overall windswept deformity Lt knee xray 12/09/21:X-rays reveal significant tricompartmental degenerative changes with varus  deformity.  There is also medial translation of the femur on the tibia.    Overall windswept deformity.   PATIENT SURVEYS:  FOTO 35 7/17: 33 7/31: 31    POSTURE: flexed through pelvis, ant pelvic tilt, bil knee windswept to the Lt  PALPATION:  Notable cavitations in bilateral knees with MMT  LUMBAR ROM:   Able to demo trunk mobility in seated but in flexed position in standing    LOWER EXTREMITY MMT:  hand held dynamometry at eval  MMT Right eval Left eval Rt/Lt 7/17 Rt / Lt  Hip flexion 19.6 lb 18.3 28.6//31 36.3/ 30.5  Hip extension      Hip abduction      Hip adduction      Hip internal rotation      Hip external rotation      Knee flexion      Knee extension 24.1 28.2 27.8//35.3 31.0 / 23.3  Ankle dorsiflexion      Ankle plantarflexion      Ankle inversion      Ankle eversion       (Blank rows = not tested)   FUNCTIONAL TESTS:  EVAL: 5 times sit to stand: unable to stand without use of UEs SLS   unable  7/17: 5TSTS: 13s- use of Rt UE and only stood approx 50% of height SLS: able to shift weight to 3s of SLS with UE assist on PT hands  GAIT: Ambulated into clinic with antalgic pattern using walking stick    TODAY'S TREATMENT  06/04/22 Pt seen for aquatic therapy today.  Treatment took place in water 3.25-4.5 ft in depth at the [facility] pool. Temp of water was 91.  Pt  entered/exited the pool via stairs independently with step-to pattern with bilat rail.  Holding yellow noodle at 4.25 ft forward, back and sidestepping.   Straddling yellow noodle: cycling, add/abd Side step with arm abdct / add  Forward walking kicks High knee marching forward  3 way leg kick (alternating LEs) holding yellow noodle UE supported yellow hand buoys:  hip ext; add/abd x 10 each  Forward/ backward walking for rest and recovery between exercises  Pt requires the buoyancy and hydrostatic pressure of water for support, and to offload joints by unweighting joint load by at least 50 % in navel deep water and by at least 75-80% in chest to neck deep water.  Viscosity of the water is needed for resistance of strengthening. Water current perturbations provides challenge to standing balance requiring increased core activation.  PATIENT EDUCATION:  Education details: exercise form/rationale, aquatics  Person educated: Patient Education method: Explanation, Demonstration, Tactile cues, Verbal cues, and Handouts Education comprehension: verbalized understanding, returned demonstration, verbal cues required, tactile cues required, and needs further education   HOME EXERCISE PROGRAM: [id number]  ASSESSMENT:  CLINICAL IMPRESSION: Progress Note: Pt has progressed although slowly with therapy.  Her STG has been met as she tolerates entire session of aquatic therapy without increased discomfort of knees. She continues to use wc to gain access to and from pool due to knee pain and great distance (500 ft) to be negotiated.  She has amb the distance without rest periods on 2 occasions, meeting LT goal but I have encouraged her to continue with WC as it does greatly increase her knee pain decreasing her ability to complete normal ADL's throughout that day.  Strength testing limited by pain with knee extension, with improvements as per chart above overall but not yet meeting goal. She is able to  tolerate increased participation in ADL's, standing for longer times to cook and walking further distances but does have subsequent pain response.  She is awaiting bariatric surgical intervention to lower BMI to be candidate for TKR's.  She will coninue to benefit form skilled physical therapy for 4 more weeks to enhance progression towards meeting all goals as well as ultimate goal of decreasing BMI.      OBJECTIVE IMPAIRMENTS Abnormal gait, decreased activity tolerance, decreased balance, decreased endurance, decreased mobility, difficulty walking, decreased strength, improper body mechanics, postural dysfunction, obesity, and pain.   ACTIVITY LIMITATIONS carrying, lifting, bending, standing, stairs, transfers, and locomotion level  PARTICIPATION LIMITATIONS: meal prep, cleaning, shopping, and community activity  PERSONAL FACTORS 1-2 comorbidities: h/o falls, GAD  are also affecting patient's functional outcome.   REHAB POTENTIAL: Fair    CLINICAL DECISION MAKING: Evolving/moderate complexity  EVALUATION COMPLEXITY: Moderate   GOALS: Goals reviewed with patient? Yes  SHORT TERM GOALS: Target date: 04/26/2022  Pt will find a proper working level in pool to tolerance of knees Baseline: Goal status:Achieved   LONG TERM GOALS: Target date: 06/20/22  Able to ambulate from car to pool without rest break Baseline: have done it before but lately getting WC due to knee pain Goal status: Achieved  2.  FOTO to meet stated goal Baseline: 35 Goal status: ongoing  3.  Gross LE strength to increase by at least 25% from eval bilaterally via hand held dynamometry testing Baseline:  Goal status: 10% achieved, addend 7/17   4.  Pt will verbalize a notable increase in participation of ADLs and more frequent mobility Baseline: Increase but continues to be limited Goal status: ongoing    PLAN: PT FREQUENCY: 1-2xw  PT DURATION: 4 weeks  PLANNED INTERVENTIONS: Therapeutic exercises,  Therapeutic activity, Neuromuscular re-education, Balance training, Gait training, Patient/Family education, Joint mobilization, Aquatic Therapy, Dry Needling, Electrical stimulation, Spinal mobilization, Cryotherapy, Moist heat, Taping, Manual therapy, and Re-evaluation.  PLAN FOR NEXT SESSION: continue aquatics, upright posture  Corrie Dandy (Frankie) Elverda Wendel MPT 06/14/22 1:10 PM

## 2022-06-18 ENCOUNTER — Encounter (HOSPITAL_BASED_OUTPATIENT_CLINIC_OR_DEPARTMENT_OTHER): Payer: Self-pay

## 2022-06-18 ENCOUNTER — Ambulatory Visit (HOSPITAL_BASED_OUTPATIENT_CLINIC_OR_DEPARTMENT_OTHER): Payer: Medicaid Other | Admitting: Physical Therapy

## 2022-06-21 ENCOUNTER — Ambulatory Visit (HOSPITAL_BASED_OUTPATIENT_CLINIC_OR_DEPARTMENT_OTHER): Payer: Medicaid Other | Attending: General Surgery | Admitting: Physical Therapy

## 2022-06-21 ENCOUNTER — Encounter (HOSPITAL_BASED_OUTPATIENT_CLINIC_OR_DEPARTMENT_OTHER): Payer: Self-pay | Admitting: Physical Therapy

## 2022-06-21 DIAGNOSIS — M25562 Pain in left knee: Secondary | ICD-10-CM | POA: Diagnosis present

## 2022-06-21 DIAGNOSIS — R262 Difficulty in walking, not elsewhere classified: Secondary | ICD-10-CM | POA: Diagnosis present

## 2022-06-21 DIAGNOSIS — M6281 Muscle weakness (generalized): Secondary | ICD-10-CM | POA: Diagnosis present

## 2022-06-21 DIAGNOSIS — M25561 Pain in right knee: Secondary | ICD-10-CM | POA: Diagnosis present

## 2022-06-21 DIAGNOSIS — G8929 Other chronic pain: Secondary | ICD-10-CM | POA: Insufficient documentation

## 2022-06-21 DIAGNOSIS — M5459 Other low back pain: Secondary | ICD-10-CM | POA: Insufficient documentation

## 2022-06-21 NOTE — Therapy (Signed)
OUTPATIENT PHYSICAL THERAPY THORACOLUMBAR       Patient Name: Jennifer [name] MRN: [id number] DOB:06-Aug-1976, 46 y.o., female Today's Date: 06/21/2022   PT End of Session - 06/21/22 1216     Visit Number 11    Number of Visits 21    Date for PT Re-Evaluation 07/12/22    Authorization Type [employer]    PT Start Time 1201    PT Stop Time 1245    PT Time Calculation (min) 44 min    Activity Tolerance Patient tolerated treatment well;Patient limited by pain    Behavior During Therapy [facility] for tasks assessed/performed               Past Medical History:  Diagnosis Date   Abnormal vaginal Pap smear    Acid reflux    Alopecia    Anxiety    Arthritis    Depression    Diabetes mellitus without complication (HCC)    prediabetic, takes no meds   Diverticulosis    Family history of diabetes mellitus    GERD (gastroesophageal reflux disease)    Hemorrhoids    History of recurrent UTIs    Hypertension    Obesity    Past Surgical History:  Procedure Laterality Date   APPENDECTOMY     COLONOSCOPY N/A 03/13/2014   Dr. Jena Gauss- grade 3 hemorrhoids, colonic diverticulosis bx= benign lymphoid polyp   COLONOSCOPY WITH PROPOFOL N/A 01/26/2021   Procedure: COLONOSCOPY WITH PROPOFOL;  Surgeon: Corbin Ade, MD;  [facility]: [facility];  Service: Endoscopy;  Laterality: N/A;  AM   KNEE ARTHROSCOPY     right   POLYPECTOMY  01/26/2021   Procedure: POLYPECTOMY;  Surgeon: Corbin Ade, MD;  Location: [facility];  Service: Endoscopy;;   Patient Active Problem List   Diagnosis Date Noted   Colovesical fistula 12/08/2021    Class: Question of   Osteoarthritis of both knees 12/03/2021   Controlled diabetes mellitus type 2 with complications (HCC) 09/09/2021   Generalized arthritis 09/09/2021   Snoring 07/06/2021   Financial difficulties 05/09/2021   Reduced vision 04/03/2021   Alternating constipation and diarrhea 12/16/2020   Hematochezia 09/09/2020   Right wrist  pain 07/21/2020   Unsteady gait 07/14/2020   Poor urinary stream 05/27/2020   Depression, major, single episode, severe (HCC) 06/18/2019   Environmental allergies 06/18/2019   Candidiasis of skin 01/22/2019   Back pain 11/28/2017   Essential hypertension 03/22/2016   Diverticulosis 11/23/2015   Hemorrhoids 03/26/2015   Insomnia due to other mental disorder 07/10/2014   GAD (generalized anxiety disorder) 07/10/2014   FHx: cancer of digestive organ 02/16/2014   Piles (hemorrhoids) 02/14/2014   Abnormal finding on Pap smear, HPV DNA positive 11/29/2013   Allergic rhinitis 02/12/2013   Prediabetes 03/26/2012   Dyspepsia 03/22/2012   TUBERCULOSIS 11/02/2010   Alopecia (capitis) totalis 11/02/2010   Chronic fatigue 07/23/2009   Morbid obesity (HCC) 11/24/2007   Depression with anxiety 11/24/2007    PCP: Syliva Overman, MD  REFERRING PROVIDER: Gaynelle Adu, MD  REFERRING DIAG: E66.01 (ICD-10-CM) - Morbid (severe) obesity due to excess calories  Rationale for Evaluation and Treatment Rehabilitation  THERAPY DIAG:  Chronic pain of left knee  Chronic pain of right knee  Other low back pain  Difficulty in walking, not elsewhere classified  ONSET DATE: knees 1998, back shortly after  SUBJECTIVE:  SUBJECTIVE STATEMENT: "New insurance"   PERTINENT HISTORY:  OA, obesity, h/o CA, GAD, h/o falls  PAIN:  Are you having pain? Yes: NPRS scale: 5/10 Pain location: bil knees (Lt>Rt) Pain description: ache Aggravating factors: constant but worse with weight bearing Relieving factors: sitting    PRECAUTIONS: Fall  WEIGHT BEARING RESTRICTIONS No  FALLS:  Has patient fallen in last 6 months? No  LIVING ENVIRONMENT: Lives with: lives with their daughter Lives in: House/apartment Stairs:  steps to enter home Has following equipment at home:  walking stick / SPC  OCCUPATION: not working right now  PLOF: Independent  PATIENT GOALS decrease pain, return to exercise   OBJECTIVE:   DIAGNOSTIC FINDINGS:  Rt knee xray 12/09/21: X-rays demonstrate marked tricompartmental degenerative changes with  valgus deformity.  Overall windswept deformity Lt knee xray 12/09/21:X-rays reveal significant tricompartmental degenerative changes with varus  deformity.  There is also medial translation of the femur on the tibia.    Overall windswept deformity.   PATIENT SURVEYS:  FOTO 35 7/17: 33 7/31: 31    POSTURE: flexed through pelvis, ant pelvic tilt, bil knee windswept to the Lt  PALPATION:  Notable cavitations in bilateral knees with MMT  LUMBAR ROM:   Able to demo trunk mobility in seated but in flexed position in standing    LOWER EXTREMITY MMT:  hand held dynamometry at eval  MMT Right eval Left eval Rt/Lt 7/17 Rt / Lt  Hip flexion 19.6 lb 18.3 28.6//31 36.3/ 30.5  Hip extension      Hip abduction      Hip adduction      Hip internal rotation      Hip external rotation      Knee flexion      Knee extension 24.1 28.2 27.8//35.3 31.0 / 23.3  Ankle dorsiflexion      Ankle plantarflexion      Ankle inversion      Ankle eversion       (Blank rows = not tested)   FUNCTIONAL TESTS:  EVAL: 5 times sit to stand: unable to stand without use of UEs SLS   unable  7/17: 5TSTS: 13s- use of Rt UE and only stood approx 50% of height SLS: able to shift weight to 3s of SLS with UE assist on PT hands  GAIT: Ambulated into clinic with antalgic pattern using walking stick    TODAY'S TREATMENT  06/04/22 Pt seen for aquatic therapy today.  Treatment took place in water 3.25-4.5 ft in depth at the [facility] pool. Temp of water was 91.  Pt entered/exited the pool via stairs independently with step-to pattern with bilat rail.  Holding yellow noodle at 4.25  ft forward, back and sidestepping.   Straddling yellow noodle: cycling, add/abd Side step with arm abdct / add  Forward walking kicks x 2 widths High knee marching forward  Yellow hand buoys submerged with forward and backward walking UE supported yellow hand buoys in 4.78ft:  squats; hip ext; add/abd x 10 each; hurdles, hip openers Seated 4th step: flutter kick 4x20; add/abd 4x20 Forward/ backward walking for rest and recovery between exercises  Pt requires the buoyancy and hydrostatic pressure of water for support, and to offload joints by unweighting joint load by at least 50 % in navel deep water and by at least 75-80% in chest to neck deep water.  Viscosity of the water is needed for resistance of strengthening. Water current perturbations provides challenge to standing balance requiring increased core activation.  PATIENT EDUCATION:  Education details: exercise form/rationale, aquatics Person educated: Patient Education method: Programmer, multimedia, Facilities manager, Actor cues, Verbal cues, and Handouts Education comprehension: verbalized understanding, returned demonstration, verbal cues required, tactile cues required, and needs further education   HOME EXERCISE PROGRAM: [id number]  ASSESSMENT:  CLINICAL IMPRESSION: Requires wc transport to and from pool to best manage (decrease) pain. Increased knee pain with unilateral stance completing ex. Supported by water best submerged as deep as possible. Windswept knee deformities creating pain which limits SLS. She does report improved management of pain with ADL's. Pt edu on knee antatomy and TKR process. Goals ongoing.      OBJECTIVE IMPAIRMENTS Abnormal gait, decreased activity tolerance, decreased balance, decreased endurance, decreased mobility, difficulty walking, decreased strength, improper body mechanics, postural dysfunction, obesity, and pain.   ACTIVITY LIMITATIONS carrying, lifting, bending, standing, stairs, transfers, and  locomotion level  PARTICIPATION LIMITATIONS: meal prep, cleaning, shopping, and community activity  PERSONAL FACTORS 1-2 comorbidities: h/o falls, GAD  are also affecting patient's functional outcome.   REHAB POTENTIAL: Fair    CLINICAL DECISION MAKING: Evolving/moderate complexity  EVALUATION COMPLEXITY: Moderate   GOALS: Goals reviewed with patient? Yes  SHORT TERM GOALS: Target date: 04/26/2022  Pt will find a proper working level in pool to tolerance of knees Baseline: Goal status:Achieved   LONG TERM GOALS: Target date: 06/20/22  Able to ambulate from car to pool without rest break Baseline: have done it before but lately getting WC due to knee pain Goal status: Achieved  2.  FOTO to meet stated goal Baseline: 35 Goal status: ongoing  3.  Gross LE strength to increase by at least 25% from eval bilaterally via hand held dynamometry testing Baseline:  Goal status: 10% achieved, addend 7/17   4.  Pt will verbalize a notable increase in participation of ADLs and more frequent mobility Baseline: Increase but continues to be limited Goal status: ongoing    PLAN: PT FREQUENCY: 1-2xw  PT DURATION: 4 weeks  PLANNED INTERVENTIONS: Therapeutic exercises, Therapeutic activity, Neuromuscular re-education, Balance training, Gait training, Patient/Family education, Joint mobilization, Aquatic Therapy, Dry Needling, Electrical stimulation, Spinal mobilization, Cryotherapy, Moist heat, Taping, Manual therapy, and Re-evaluation.  PLAN FOR NEXT SESSION: continue aquatics, upright posture  Rushie Chestnut) Khye Hochstetler MPT 06/21/22 12:18 PM

## 2022-06-23 ENCOUNTER — Encounter (INDEPENDENT_AMBULATORY_CARE_PROVIDER_SITE_OTHER): Payer: Self-pay

## 2022-06-25 ENCOUNTER — Ambulatory Visit (HOSPITAL_BASED_OUTPATIENT_CLINIC_OR_DEPARTMENT_OTHER): Payer: Medicaid Other | Admitting: Physical Therapy

## 2022-06-25 ENCOUNTER — Encounter (HOSPITAL_BASED_OUTPATIENT_CLINIC_OR_DEPARTMENT_OTHER): Payer: Self-pay

## 2022-06-28 ENCOUNTER — Ambulatory Visit: Payer: 59 | Admitting: Dietician

## 2022-06-28 ENCOUNTER — Encounter: Payer: Medicaid Other | Attending: General Surgery | Admitting: Dietician

## 2022-06-28 ENCOUNTER — Encounter (HOSPITAL_BASED_OUTPATIENT_CLINIC_OR_DEPARTMENT_OTHER): Payer: Self-pay | Admitting: Physical Therapy

## 2022-06-28 ENCOUNTER — Ambulatory Visit (HOSPITAL_BASED_OUTPATIENT_CLINIC_OR_DEPARTMENT_OTHER): Payer: Medicaid Other | Admitting: Physical Therapy

## 2022-06-28 ENCOUNTER — Encounter: Payer: Self-pay | Admitting: Dietician

## 2022-06-28 DIAGNOSIS — G8929 Other chronic pain: Secondary | ICD-10-CM

## 2022-06-28 DIAGNOSIS — R262 Difficulty in walking, not elsewhere classified: Secondary | ICD-10-CM

## 2022-06-28 DIAGNOSIS — M5459 Other low back pain: Secondary | ICD-10-CM

## 2022-06-28 DIAGNOSIS — M25562 Pain in left knee: Secondary | ICD-10-CM | POA: Diagnosis not present

## 2022-06-28 NOTE — Therapy (Signed)
OUTPATIENT PHYSICAL THERAPY THORACOLUMBAR       Patient Name: Jennifer [name] MRN: [id number] DOB:January 24, 1976, 46 y.o., female Today's Date: 06/28/2022   PT End of Session - 06/28/22 1204     Visit Number 12    Number of Visits 21    Date for PT Re-Evaluation 07/12/22    Authorization Type [employer]    PT Start Time 1155    PT Stop Time 1240    PT Time Calculation (min) 45 min    Activity Tolerance Patient tolerated treatment well;Patient limited by pain    Behavior During Therapy [facility] for tasks assessed/performed               Past Medical History:  Diagnosis Date   Abnormal vaginal Pap smear    Acid reflux    Alopecia    Anxiety    Arthritis    Depression    Diabetes mellitus without complication (HCC)    prediabetic, takes no meds   Diverticulosis    Family history of diabetes mellitus    GERD (gastroesophageal reflux disease)    Hemorrhoids    History of recurrent UTIs    Hypertension    Obesity    Past Surgical History:  Procedure Laterality Date   APPENDECTOMY     COLONOSCOPY N/A 03/13/2014   Dr. Jena Gauss- grade 3 hemorrhoids, colonic diverticulosis bx= benign lymphoid polyp   COLONOSCOPY WITH PROPOFOL N/A 01/26/2021   Procedure: COLONOSCOPY WITH PROPOFOL;  Surgeon: Corbin Ade, MD;  [facility];  Service: Endoscopy;  Laterality: N/A;  AM   KNEE ARTHROSCOPY     right   POLYPECTOMY  01/26/2021   Procedure: POLYPECTOMY;  Surgeon: Corbin Ade, MD;  [facility];  Service: Endoscopy;;   Patient Active Problem List   Diagnosis Date Noted   Colovesical fistula 12/08/2021    Class: Question of   Osteoarthritis of both knees 12/03/2021   Controlled diabetes mellitus type 2 with complications (HCC) 09/09/2021   Generalized arthritis 09/09/2021   Snoring 07/06/2021   Financial difficulties 05/09/2021   Reduced vision 04/03/2021   Alternating constipation and diarrhea 12/16/2020   Hematochezia 09/09/2020   Right wrist  pain 07/21/2020   Unsteady gait 07/14/2020   Poor urinary stream 05/27/2020   Depression, major, single episode, severe (HCC) 06/18/2019   Environmental allergies 06/18/2019   Candidiasis of skin 01/22/2019   Back pain 11/28/2017   Essential hypertension 03/22/2016   Diverticulosis 11/23/2015   Hemorrhoids 03/26/2015   Insomnia due to other mental disorder 07/10/2014   GAD (generalized anxiety disorder) 07/10/2014   FHx: cancer of digestive organ 02/16/2014   Piles (hemorrhoids) 02/14/2014   Abnormal finding on Pap smear, HPV DNA positive 11/29/2013   Allergic rhinitis 02/12/2013   Prediabetes 03/26/2012   Dyspepsia 03/22/2012   TUBERCULOSIS 11/02/2010   Alopecia (capitis) totalis 11/02/2010   Chronic fatigue 07/23/2009   Morbid obesity (HCC) 11/24/2007   Depression with anxiety 11/24/2007    PCP: Syliva Overman, MD  REFERRING PROVIDER: Gaynelle Adu, MD  REFERRING DIAG: E66.01 (ICD-10-CM) - Morbid (severe) obesity due to excess calories  Rationale for Evaluation and Treatment Rehabilitation  THERAPY DIAG:  Chronic pain of left knee  Chronic pain of right knee  Other low back pain  Difficulty in walking, not elsewhere classified  ONSET DATE: knees 1998, back shortly after  SUBJECTIVE:  SUBJECTIVE STATEMENT: "Gained 3 lbs, went to weight loss clinic today. My knees don't hurt in the pool"   PERTINENT HISTORY:  OA, obesity, h/o CA, GAD, h/o falls  PAIN:  Are you having pain? Yes: NPRS scale: 7/10 Pain location: bil knees (Lt>Rt) Pain description: ache Aggravating factors: constant but worse with weight bearing Relieving factors: sitting    PRECAUTIONS: Fall  WEIGHT BEARING RESTRICTIONS No  FALLS:  Has patient fallen in last 6 months? No  LIVING ENVIRONMENT: Lives  with: lives with their daughter Lives in: House/apartment Stairs: steps to enter home Has following equipment at home:  walking stick / SPC  OCCUPATION: not working right now  PLOF: Independent  PATIENT GOALS decrease pain, return to exercise   OBJECTIVE:   DIAGNOSTIC FINDINGS:  Rt knee xray 12/09/21: X-rays demonstrate marked tricompartmental degenerative changes with  valgus deformity.  Overall windswept deformity Lt knee xray 12/09/21:X-rays reveal significant tricompartmental degenerative changes with varus  deformity.  There is also medial translation of the femur on the tibia.    Overall windswept deformity.   PATIENT SURVEYS:  FOTO 35 7/17: 33 7/31: 31    POSTURE: flexed through pelvis, ant pelvic tilt, bil knee windswept to the Lt  PALPATION:  Notable cavitations in bilateral knees with MMT  LUMBAR ROM:   Able to demo trunk mobility in seated but in flexed position in standing    LOWER EXTREMITY MMT:  hand held dynamometry at eval  MMT Right eval Left eval Rt/Lt 7/17 Rt / Lt  Hip flexion 19.6 lb 18.3 28.6//31 36.3/ 30.5  Hip extension      Hip abduction      Hip adduction      Hip internal rotation      Hip external rotation      Knee flexion      Knee extension 24.1 28.2 27.8//35.3 31.0 / 23.3  Ankle dorsiflexion      Ankle plantarflexion      Ankle inversion      Ankle eversion       (Blank rows = not tested)   FUNCTIONAL TESTS:  EVAL: 5 times sit to stand: unable to stand without use of UEs SLS   unable  7/17: 5TSTS: 13s- use of Rt UE and only stood approx 50% of height SLS: able to shift weight to 3s of SLS with UE assist on PT hands  GAIT: Ambulated into clinic with antalgic pattern using walking stick    TODAY'S TREATMENT  06/04/22 Pt seen for aquatic therapy today.  Treatment took place in water 3.25-4.5 ft in depth at the [facility] pool. Temp of water was 91.  Pt entered/exited the pool via stairs independently  with step-to pattern with bilat rail after wc transport to pool  Holding yellow noodle at 4.25 ft forward, back and sidestepping.   Straddling yellow noodle: cycling, add/abd, skiing Side step with arm abdct / add x 4 widths UE supported yellow hand buoys in 4.62ft:  squats; hip ext; add/abd x 10 each; hurdles, hip openers Forward walking kicks x 2 widths Yellow hand buoys submerged with forward and backward walking Seated 4th step: flutter kick 4x20; add/abd 4x20 Forward/ backward walking for rest and recovery between exercises   Pt requires the buoyancy and hydrostatic pressure of water for support, and to offload joints by unweighting joint load by at least 50 % in navel deep water and by at least 75-80% in chest to neck deep water.  Viscosity of the water is  needed for resistance of strengthening. Water current perturbations provides challenge to standing balance requiring increased core activation.  PATIENT EDUCATION:  Education details: exercise form/rationale, aquatics Person educated: Patient Education method: Programmer, multimedia, Facilities manager, Actor cues, Verbal cues, and Handouts Education comprehension: verbalized understanding, returned demonstration, verbal cues required, tactile cues required, and needs further education   HOME EXERCISE PROGRAM: [id number]  ASSESSMENT:  CLINICAL IMPRESSION: Pt directed through exercises tolerating fair to well.  Complains of knee pain with standing exercises, stabilizing leg bilaterally, deeper submerged the less pain.  Given multiple rest periods between.  Straddling noodle exercises decrease knee pain, tolerated very well.  Improved sitting balance as pt able to gain position seated on noodle indep. Pt without decrease in BMI. She will continue to benefit from aquatic therapy to progress towards goals and improve functional mobility.       OBJECTIVE IMPAIRMENTS Abnormal gait, decreased activity tolerance, decreased balance, decreased  endurance, decreased mobility, difficulty walking, decreased strength, improper body mechanics, postural dysfunction, obesity, and pain.   ACTIVITY LIMITATIONS carrying, lifting, bending, standing, stairs, transfers, and locomotion level  PARTICIPATION LIMITATIONS: meal prep, cleaning, shopping, and community activity  PERSONAL FACTORS 1-2 comorbidities: h/o falls, GAD  are also affecting patient's functional outcome.   REHAB POTENTIAL: Fair    CLINICAL DECISION MAKING: Evolving/moderate complexity  EVALUATION COMPLEXITY: Moderate   GOALS: Goals reviewed with patient? Yes  SHORT TERM GOALS: Target date: 04/26/2022  Pt will find a proper working level in pool to tolerance of knees Baseline: Goal status:Achieved   LONG TERM GOALS: Target date: 06/20/22  Able to ambulate from car to pool without rest break Baseline: have done it before but lately getting WC due to knee pain Goal status: Achieved  2.  FOTO to meet stated goal Baseline: 35 Goal status: ongoing  3.  Gross LE strength to increase by at least 25% from eval bilaterally via hand held dynamometry testing Baseline:  Goal status: 10% achieved, addend 7/17   4.  Pt will verbalize a notable increase in participation of ADLs and more frequent mobility Baseline: Increase but continues to be limited Goal status: ongoing    PLAN: PT FREQUENCY: 1-2xw  PT DURATION: 4 weeks  PLANNED INTERVENTIONS: Therapeutic exercises, Therapeutic activity, Neuromuscular re-education, Balance training, Gait training, Patient/Family education, Joint mobilization, Aquatic Therapy, Dry Needling, Electrical stimulation, Spinal mobilization, Cryotherapy, Moist heat, Taping, Manual therapy, and Re-evaluation.  PLAN FOR NEXT SESSION: continue aquatics, upright posture  Rushie Chestnut) Jshon Ibe MPT 06/28/22 12:05 PM

## 2022-06-28 NOTE — Progress Notes (Signed)
Supervised Weight Loss Visit Bariatric Nutrition Education  Planned surgery: RYGB Pt expectation of surgery: to be comfortable  2 out of 6 SWL Appointments   NUTRITION ASSESSMENT   Anthropometrics  Start weight at [facility]: 310.2 lbs (date: 04/30/2022)  Height: 67 in Weight: 310.4 lbs. BMI: 48.62 kg/m2    Clinical  Medical hx: Anxiety, arthritis, depression, GERD, HTN Medications: Megestrol, Mounjaro, buspirone oxybutynin, triamterene, venlafaxine XR  Labs: 12/03/2021: A1C 6.2 Notable signs/symptoms: none noted Any previous deficiencies? No  Lifestyle & Dietary Hx  Pt arrived using a cane, moving slowly.  Pt states her main pain comes from her knees, but sometimes her lower back. Pt states she is not taking the [facility], stating she can't afford it.  Pt states money is tight right now. Pt states water therapy is going well.  Pt states water therapy is 2 days a week. Pt states she is always rushing trying to get to her destination.  Her mobility is limited and takes a long time to get to where she needs to go, and often does not eat. Pt states she needs to leave food out the day before to have food handy.  Pt states she can leave fruit out to eat, to help avoid skipping meals. Pt states when she feels week and light headed at pool therapy when she has not eaten.  Dietitian reiterated the importance of eating throughout the day. Pt states she is drink at least 4 bottles of water a day, and reduced her sugary drinks. Pt agreeable to focusing on not skipping meals and eating throughout the day.  Estimated daily fluid intake: 64+ oz Supplements:  Current average weekly physical activity: pool therapy, two days a week.  24-Hr Dietary Recall First Meal: skip Snack:  Second Meal: skip Snack: bag of chips Third Meal: two hot pockets Snack: fruit like catalope or watermelon or crackers Beverages: water, sprite  Estimated Energy Needs 1500   NUTRITION DIAGNOSIS  Overweight/obesity  ([place]-3.3) related to past poor dietary habits and physical inactivity as evidenced by patient w/ planned RYGB surgery following dietary guidelines for continued weight loss.   NUTRITION INTERVENTION  Nutrition counseling (C-1) and education (E-2) to facilitate bariatric surgery goals.  Pre-Op Goals Progress & New Goals Continue: reduce sugary drinks and soda Continue: water therapy New: eat throughout the day; meal prep the day before to help reduce skipping meals.  Handouts Provided Include    Learning Style & Readiness for Change Teaching method utilized: Visual & Auditory  Demonstrated degree of understanding via: Teach Back  Readiness Level: contemplative Barriers to learning/adherence to lifestyle change: previous habits  RD's Notes for next Visit  Pt progress toward chosen goals   MONITORING & EVALUATION Dietary intake, weekly physical activity, body weight, and pre-op goals in 1 month.   Next Steps  Patient is to return to [facility] in 1 month for next SWL visit.

## 2022-06-30 ENCOUNTER — Other Ambulatory Visit: Payer: Self-pay

## 2022-06-30 DIAGNOSIS — F418 Other specified anxiety disorders: Secondary | ICD-10-CM

## 2022-06-30 NOTE — Telephone Encounter (Signed)
Patient aware.

## 2022-07-02 ENCOUNTER — Ambulatory Visit (INDEPENDENT_AMBULATORY_CARE_PROVIDER_SITE_OTHER): Payer: Medicaid Other | Admitting: Family Medicine

## 2022-07-02 ENCOUNTER — Ambulatory Visit (HOSPITAL_BASED_OUTPATIENT_CLINIC_OR_DEPARTMENT_OTHER): Payer: Medicaid Other | Admitting: Physical Therapy

## 2022-07-02 ENCOUNTER — Encounter: Payer: Self-pay | Admitting: Family Medicine

## 2022-07-02 ENCOUNTER — Encounter (HOSPITAL_BASED_OUTPATIENT_CLINIC_OR_DEPARTMENT_OTHER): Payer: Self-pay | Admitting: Physical Therapy

## 2022-07-02 VITALS — BP 124/82 | HR 99 | Resp 17 | Ht 67.0 in | Wt 311.0 lb

## 2022-07-02 DIAGNOSIS — M6281 Muscle weakness (generalized): Secondary | ICD-10-CM

## 2022-07-02 DIAGNOSIS — Z23 Encounter for immunization: Secondary | ICD-10-CM

## 2022-07-02 DIAGNOSIS — F322 Major depressive disorder, single episode, severe without psychotic features: Secondary | ICD-10-CM | POA: Diagnosis not present

## 2022-07-02 DIAGNOSIS — M65331 Trigger finger, right middle finger: Secondary | ICD-10-CM | POA: Diagnosis not present

## 2022-07-02 DIAGNOSIS — R262 Difficulty in walking, not elsewhere classified: Secondary | ICD-10-CM

## 2022-07-02 DIAGNOSIS — R7303 Prediabetes: Secondary | ICD-10-CM

## 2022-07-02 DIAGNOSIS — M170 Bilateral primary osteoarthritis of knee: Secondary | ICD-10-CM

## 2022-07-02 DIAGNOSIS — G8929 Other chronic pain: Secondary | ICD-10-CM

## 2022-07-02 DIAGNOSIS — M5459 Other low back pain: Secondary | ICD-10-CM

## 2022-07-02 DIAGNOSIS — M25562 Pain in left knee: Secondary | ICD-10-CM | POA: Diagnosis not present

## 2022-07-02 LAB — POCT GLYCOSYLATED HEMOGLOBIN (HGB A1C): HbA1c, POC (prediabetic range): 5.7 % (ref 5.7–6.4)

## 2022-07-02 MED ORDER — MELOXICAM 15 MG PO TABS: 15.0000 mg | ORAL_TABLET | Freq: Every day | ORAL | 2 refills | Status: DC

## 2022-07-02 MED ORDER — METHYLPREDNISOLONE ACETATE 80 MG/ML IJ SUSP
80.0000 mg | Freq: Once | INTRAMUSCULAR | Status: AC
  Administered 2022-07-02: 80 mg via INTRAMUSCULAR

## 2022-07-02 MED ORDER — FLUTICASONE PROPIONATE 50 MCG/ACT NA SUSP: 2.0000 | Freq: Every day | NASAL | 6 refills | Status: DC

## 2022-07-02 MED ORDER — KETOROLAC TROMETHAMINE 60 MG/2ML IM SOLN
60.0000 mg | Freq: Once | INTRAMUSCULAR | Status: AC
  Administered 2022-07-02: 60 mg via INTRAMUSCULAR

## 2022-07-02 MED ORDER — GABAPENTIN 300 MG PO CAPS: 300.0000 mg | ORAL_CAPSULE | Freq: Every day | ORAL | 3 refills | Status: DC

## 2022-07-02 NOTE — Patient Instructions (Addendum)
F/u in 10 weeks, call if you need me sooner  Flu vaccine today  Toradol 60 mg and Depo medrol 80 mg iM today  Glycohb today in office  Handicap sticker for 5 years  today '  Meloxicam, tylenol and gabapentin are prescribed for arthritic pain I recommend icing knees twice daily and continuing water exercises  You are referred to Psychiatry  You are referred to Orthopedics  Thanks for choosing [facility], we consider it a privelige to serve you.

## 2022-07-02 NOTE — Therapy (Signed)
OUTPATIENT PHYSICAL THERAPY THORACOLUMBAR       Patient Name: Jennifer [name] MRN: [id number] DOB:01/31/1976, 46 y.o., female Today's Date: 07/02/2022   PT End of Session - 07/02/22 1320     Visit Number 13    Number of Visits 21    Date for PT Re-Evaluation 07/12/22    Authorization Type [employer]    PT Start Time 1315   pt late to arrive to pool area   PT Stop Time 1345    PT Time Calculation (min) 30 min    Activity Tolerance Patient tolerated treatment well    Behavior During Therapy [facility] for tasks assessed/performed               Past Medical History:  Diagnosis Date   Abnormal vaginal Pap smear    Acid reflux    Alopecia    Anxiety    Arthritis    Depression    Diabetes mellitus without complication (HCC)    prediabetic, takes no meds   Diverticulosis    Family history of diabetes mellitus    GERD (gastroesophageal reflux disease)    Hemorrhoids    History of recurrent UTIs    Hypertension    Obesity    Past Surgical History:  Procedure Laterality Date   APPENDECTOMY     COLONOSCOPY N/A 03/13/2014   Dr. Jena Gauss- grade 3 hemorrhoids, colonic diverticulosis bx= benign lymphoid polyp   COLONOSCOPY WITH PROPOFOL N/A 01/26/2021   Procedure: COLONOSCOPY WITH PROPOFOL;  Surgeon: Corbin Ade, MD;  Location: [facility];  Service: Endoscopy;  Laterality: N/A;  AM   KNEE ARTHROSCOPY     right   POLYPECTOMY  01/26/2021   Procedure: POLYPECTOMY;  Surgeon: Corbin Ade, MD;  Location: [facility];  Service: Endoscopy;;   Patient Active Problem List   Diagnosis Date Noted   Trigger finger, right middle finger 07/02/2022   Colovesical fistula 12/08/2021    Class: Question of   Osteoarthritis of both knees 12/03/2021   Controlled diabetes mellitus type 2 with complications (HCC) 09/09/2021   Generalized arthritis 09/09/2021   Snoring 07/06/2021   Financial difficulties 05/09/2021   Reduced vision 04/03/2021   Alternating constipation and  diarrhea 12/16/2020   Hematochezia 09/09/2020   Right wrist pain 07/21/2020   Unsteady gait 07/14/2020   Poor urinary stream 05/27/2020   Depression, major, single episode, severe (HCC) 06/18/2019   Environmental allergies 06/18/2019   Candidiasis of skin 01/22/2019   Back pain 11/28/2017   Essential hypertension 03/22/2016   Diverticulosis 11/23/2015   Hemorrhoids 03/26/2015   Insomnia due to other mental disorder 07/10/2014   GAD (generalized anxiety disorder) 07/10/2014   FHx: cancer of digestive organ 02/16/2014   Piles (hemorrhoids) 02/14/2014   Abnormal finding on Pap smear, HPV DNA positive 11/29/2013   Allergic rhinitis 02/12/2013   Prediabetes 03/26/2012   Dyspepsia 03/22/2012   TUBERCULOSIS 11/02/2010   Alopecia (capitis) totalis 11/02/2010   Chronic fatigue 07/23/2009   Morbid obesity (HCC) 11/24/2007   Depression with anxiety 11/24/2007    PCP: Syliva Overman, MD  REFERRING PROVIDER: Gaynelle Adu, MD  REFERRING DIAG: E66.01 (ICD-10-CM) - Morbid (severe) obesity due to excess calories  Rationale for Evaluation and Treatment Rehabilitation  THERAPY DIAG:  Chronic pain of left knee  Chronic pain of right knee  Other low back pain  Difficulty in walking, not elsewhere classified  Muscle weakness (generalized)  ONSET DATE: knees 1998, back shortly after  SUBJECTIVE:  SUBJECTIVE STATEMENT: Pt reports she had appts with Dr. Lodema Hong and received injections in her lower back this morning, along with a flu shot.  "I was feeling sick to my stomach afterwards, but I think it's passed".  Pt reports she was cleared to get in to pool today.   PERTINENT HISTORY:  OA, obesity, h/o CA, GAD, h/o falls  PAIN:  Are you having pain? Yes: NPRS scale: 8/10 Pain location: bil knees  (Lt>Rt) Pain description: ache Aggravating factors: constant but worse with weight bearing Relieving factors: sitting    PRECAUTIONS: Fall  WEIGHT BEARING RESTRICTIONS No  FALLS:  Has patient fallen in last 6 months? No  LIVING ENVIRONMENT: Lives with: lives with their daughter Lives in: House/apartment Stairs: steps to enter home Has following equipment at home:  walking stick / SPC  OCCUPATION: not working right now  PLOF: Independent  PATIENT GOALS decrease pain, return to exercise   OBJECTIVE:   DIAGNOSTIC FINDINGS:  Rt knee xray 12/09/21: X-rays demonstrate marked tricompartmental degenerative changes with  valgus deformity.  Overall windswept deformity Lt knee xray 12/09/21:X-rays reveal significant tricompartmental degenerative changes with varus  deformity.  There is also medial translation of the femur on the tibia.    Overall windswept deformity.   PATIENT SURVEYS:  FOTO 35 7/17: 33 7/31: 31    POSTURE: flexed through pelvis, ant pelvic tilt, bil knee windswept to the Lt  PALPATION:  Notable cavitations in bilateral knees with MMT  LUMBAR ROM:   Able to demo trunk mobility in seated but in flexed position in standing    LOWER EXTREMITY MMT:  hand held dynamometry at eval  MMT Right eval Left eval Rt/Lt 7/17 Rt / Lt  Hip flexion 19.6 lb 18.3 28.6//31 36.3/ 30.5  Hip extension      Hip abduction      Hip adduction      Hip internal rotation      Hip external rotation      Knee flexion      Knee extension 24.1 28.2 27.8//35.3 31.0 / 23.3  Ankle dorsiflexion      Ankle plantarflexion      Ankle inversion      Ankle eversion       (Blank rows = not tested)   FUNCTIONAL TESTS:  EVAL: 5 times sit to stand: unable to stand without use of UEs SLS   unable  7/17: 5TSTS: 13s- use of Rt UE and only stood approx 50% of height SLS: able to shift weight to 3s of SLS with UE assist on PT hands  GAIT: Ambulated into clinic with antalgic  pattern using walking stick    TODAY'S TREATMENT: Pt seen for aquatic therapy today.  Treatment took place in water 3.25-4.5 ft in depth at the [facility] pool. Temp of water was 91.  Pt entered/exited the pool via stairs independently with step-to pattern with bilat rail after wc transport to pool  Holding yellow noodle at 4.25 ft forward, back and sidestepping cues for step length, heel strike, knee flexion. Adjusting speed faster/ slower Straddling yellow noodle: cycling, add/abd, skiing Holding yellow noodle:  3 way straight leg kick x 5 each side; forward walking kicks UE supported yellow hand buoys in 15ft:  squats; in 15ft 6" -hip openers and hip crosses, bilat heel raises Yellow hand buoys submerged with forward and backward walking   Pt requires the buoyancy and hydrostatic pressure of water for support, and to offload joints by unweighting joint load by  at least 50 % in navel deep water and by at least 75-80% in chest to neck deep water.  Viscosity of the water is needed for resistance of strengthening. Water current perturbations provides challenge to standing balance requiring increased core activation.  PATIENT EDUCATION:  Education details: exercise form/rationale, aquatics Person educated: Patient Education method: Programmer, multimedia, Demonstration, Tactile cues, Verbal cues, and Handouts Education comprehension: verbalized understanding, returned demonstration, verbal cues required, tactile cues required, and needs further education   HOME EXERCISE PROGRAM: [id number]  ASSESSMENT:  CLINICAL IMPRESSION: Pt reports elimination of back and knee pain when exercising submerged in 4+ ft of water. She continues to require occasional cues for more upright posture (shoulders over hips)  she did not require any rest breaks this visit.  Session shortened due to her late arrival.   She will continue to benefit from aquatic therapy to progress towards goals and improve functional  mobility.  End of POC coming in 10 days.        OBJECTIVE IMPAIRMENTS Abnormal gait, decreased activity tolerance, decreased balance, decreased endurance, decreased mobility, difficulty walking, decreased strength, improper body mechanics, postural dysfunction, obesity, and pain.   ACTIVITY LIMITATIONS carrying, lifting, bending, standing, stairs, transfers, and locomotion level  PARTICIPATION LIMITATIONS: meal prep, cleaning, shopping, and community activity  PERSONAL FACTORS 1-2 comorbidities: h/o falls, GAD  are also affecting patient's functional outcome.   REHAB POTENTIAL: Fair    CLINICAL DECISION MAKING: Evolving/moderate complexity  EVALUATION COMPLEXITY: Moderate   GOALS: Goals reviewed with patient? Yes  SHORT TERM GOALS: Target date: 04/26/2022  Pt will find a proper working level in pool to tolerance of knees Baseline: Goal status:Achieved   LONG TERM GOALS: Target date: 06/20/22  Able to ambulate from car to pool without rest break Baseline: have done it before but lately getting WC due to knee pain Goal status: Achieved  2.  FOTO to meet stated goal Baseline: 35 Goal status: ongoing  3.  Gross LE strength to increase by at least 25% from eval bilaterally via hand held dynamometry testing Baseline:  Goal status: 10% achieved, addend 7/17   4.  Pt will verbalize a notable increase in participation of ADLs and more frequent mobility Baseline: Increase but continues to be limited Goal status: ongoing    PLAN: PT FREQUENCY: 1-2xw  PT DURATION: 4 weeks  PLANNED INTERVENTIONS: Therapeutic exercises, Therapeutic activity, Neuromuscular re-education, Balance training, Gait training, Patient/Family education, Joint mobilization, Aquatic Therapy, Dry Needling, Electrical stimulation, Spinal mobilization, Cryotherapy, Moist heat, Taping, Manual therapy, and Re-evaluation.  PLAN FOR NEXT SESSION: continue aquatics, upright posture.  Begin d/c planning.    Mayer Camel, PTA 07/02/22 1:26 PM

## 2022-07-02 NOTE — Progress Notes (Unsigned)
Jennifer [name]     MRN: [id number]      DOB: 1976/10/15   HPI Jennifer [name] is here for follow up and re-evaluation of chronic medical conditions, medication management and review of any available recent lab and radiology data.  Preventive health is updated, specifically  Cancer screening and Immunization.   Questions or concerns regarding consultations or procedures which the PT has had in the interim are  addressed. Very depressed, feels hopeless constant pain, recently lost family membermwho was helping her with disability,challenged with weight loss, mobility very limited, knee pain rated at at 10, uses a cane at home has had near falls, left worse than right  Right  middle finger trigger x 1 month Back pain  rated at 8, when walks has to break because of knee and back pain Mental health has deterioratd in past  3 months lost cousin at that time and since then she has worsened, Is on multiple meds, needs Psych management  ROS Denies recent fever or chills. Denies sinus pressure, nasal congestion, ear pain or sore throat. Denies chest congestion, productive cough or wheezing. Denies chest pains, palpitations and leg swelling Denies abdominal pain, nausea, vomiting,diarrhea or constipation.   Denies dysuria, frequency, hesitancy or incontinence. . Denies headaches, seizures, numbness, or tingling. Denies skin break down or rash.   PE  BP 124/82   Pulse 99   Resp 17   Ht 5\' 7"  (1.702 m)   Wt (!) 311 lb (141.1 kg)   BMI 48.71 kg/m   Patient alert and oriented and in no cardiopulmonary distress.Tearful and depressed appearing  HEENT: No facial asymmetry, EOMI,     Neck supple .  Chest: Clear to auscultation bilaterally.  CVS: S1, S2 no murmurs, no S3.Regular rate.  ABD: Soft non tender.   Ext: No edema  MS: markedly decreased ROM spine, , hips and knees.  Skin: Intact, no ulcerations or rash noted.  Psych: Good eye contact, flat affect. Memory intact  anxious and   depressed appearing.  CNS: CN 2-12 intact, power,  normal throughout.no focal deficits noted.   Assessment & Plan  Depression, major, single episode, severe (HCC) Refer to psychiatry, not actively suicidal or homicidal  Prediabetes Improved Patient educated about the importance of limiting  Carbohydrate intake , the need to commit to daily physical activity for a minimum of 30 minutes , and to commit weight loss. The fact that changes in all these areas will reduce or eliminate all together the development of diabetes is stressed.      Latest Ref Rng & Units 07/02/2022   11:39 AM 01/28/2022    8:15 AM 12/03/2021   10:18 AM 05/06/2021   10:59 AM 11/25/2020    9:18 AM  Diabetic Labs  HbA1c 5.7 - 6.4 % 5.7   6.2  6.5    Chol 100 - 199 mg/dL    132    HDL >44 mg/dL    49    Calc LDL 0 - 99 mg/dL    88    Triglycerides 0 - 149 mg/dL    79    Creatinine 0.10 - 0.99 mg/dL  2.72  5.36  6.44  0.34       07/05/2022   10:59 AM 07/02/2022    9:47 AM 06/28/2022    9:13 AM 05/24/2022    9:08 AM 05/08/2022    3:16 PM 04/30/2022    2:48 PM 04/30/2022    2:29 PM  BP/Weight  Systolic  BP 130 124   115 150 144  Diastolic BP 86 82   81 90 80  Wt. (Lbs) 311.2 311 310.4 307.5   310  BMI 48.74 kg/m2 48.71 kg/m2 48.62 kg/m2 48.16 kg/m2   48.55 kg/m2       No data to display            Trigger finger, right middle finger Refer ortho for management  Osteoarthritis of both knees Uncontrolled and disabling pain, toradol 60 mg  and depo medrol 80 mg  IM in offoce  Morbid obesity (HCC)  Patient re-educated about  the importance of commitment to a  minimum of 150 minutes of exercise per week as able.  The importance of healthy food choices with portion control discussed, as well as eating regularly and within a 12 hour window most days. The need to choose "clean , green" food 50 to 75% of the time is discussed, as well as to make water the primary drink and set a goal of 64 ounces water  daily.       07/05/2022   10:59 AM 07/02/2022    9:47 AM 06/28/2022    9:13 AM  Weight /BMI  Weight 311 lb 3.2 oz 311 lb 310 lb 6.4 oz  Height 5\' 7"  (1.702 m) 5\' 7"  (1.702 m) 5\' 7"  (1.702 m)  BMI 48.74 kg/m2 48.71 kg/m2 48.62 kg/m2

## 2022-07-05 ENCOUNTER — Ambulatory Visit (INDEPENDENT_AMBULATORY_CARE_PROVIDER_SITE_OTHER): Payer: Medicaid Other | Admitting: Nurse Practitioner

## 2022-07-05 ENCOUNTER — Encounter: Payer: Self-pay | Admitting: Nurse Practitioner

## 2022-07-05 ENCOUNTER — Encounter (HOSPITAL_BASED_OUTPATIENT_CLINIC_OR_DEPARTMENT_OTHER): Payer: Self-pay | Admitting: Physical Therapy

## 2022-07-05 ENCOUNTER — Ambulatory Visit (HOSPITAL_BASED_OUTPATIENT_CLINIC_OR_DEPARTMENT_OTHER): Payer: Medicaid Other | Admitting: Physical Therapy

## 2022-07-05 ENCOUNTER — Encounter: Payer: Self-pay | Admitting: Family Medicine

## 2022-07-05 DIAGNOSIS — J039 Acute tonsillitis, unspecified: Secondary | ICD-10-CM

## 2022-07-05 DIAGNOSIS — M25562 Pain in left knee: Secondary | ICD-10-CM | POA: Diagnosis not present

## 2022-07-05 DIAGNOSIS — G4733 Obstructive sleep apnea (adult) (pediatric): Secondary | ICD-10-CM

## 2022-07-05 DIAGNOSIS — G8929 Other chronic pain: Secondary | ICD-10-CM

## 2022-07-05 DIAGNOSIS — M5459 Other low back pain: Secondary | ICD-10-CM

## 2022-07-05 NOTE — Assessment & Plan Note (Signed)
Improved Patient educated about the importance of limiting  Carbohydrate intake , the need to commit to daily physical activity for a minimum of 30 minutes , and to commit weight loss. The fact that changes in all these areas will reduce or eliminate all together the development of diabetes is stressed.      Latest Ref Rng & Units 07/02/2022   11:39 AM 01/28/2022    8:15 AM 12/03/2021   10:18 AM 05/06/2021   10:59 AM 11/25/2020    9:18 AM  Diabetic Labs  HbA1c 5.7 - 6.4 % 5.7   6.2  6.5    Chol 100 - 199 mg/dL    152    HDL >39 mg/dL    49    Calc LDL 0 - 99 mg/dL    88    Triglycerides 0 - 149 mg/dL    79    Creatinine 0.50 - 0.99 mg/dL  1.37  1.20  0.85  0.88       07/05/2022   10:59 AM 07/02/2022    9:47 AM 06/28/2022    9:13 AM 05/24/2022    9:08 AM 05/08/2022    3:16 PM 04/30/2022    2:48 PM 04/30/2022    2:29 PM  BP/Weight  Systolic BP 957 473   403 709 643  Diastolic BP 86 82   81 90 80  Wt. (Lbs) 311.2 311 310.4 307.5   310  BMI 48.74 kg/m2 48.71 kg/m2 48.62 kg/m2 48.16 kg/m2   48.55 kg/m2       No data to display

## 2022-07-05 NOTE — Assessment & Plan Note (Addendum)
She has mild obstructive sleep apnea with AHI 12.4/h. We discussed how untreated sleep apnea puts an individual at risk for cardiac arrhthymias, pulm HTN, DM, stroke and increases their risk for daytime accidents; although, these are less likely with mild severity. We also briefly reviewed treatment options including weight loss, side sleeping position, oral appliance, CPAP therapy or referral to ENT for possible surgical options.  She would like to hold off on CPAP therapy at this point.  Wants to move forward with watchful waiting and continue to work on weight loss efforts and use a side-lying sleeping position.  Caution to be aware of and avoid drowsy driving.  Patient Instructions  We discussed how untreated sleep apnea puts an individual at risk for cardiac arrhthymias, pulm HTN, DM, stroke and increases their risk for daytime accidents. We also briefly reviewed treatment options including weight loss, side sleeping position, oral appliance, CPAP therapy or referral to ENT for possible surgical options  Continue working on weight loss measures   Follow up in one year with Dr. Ander Slade or as needed

## 2022-07-05 NOTE — Patient Instructions (Addendum)
We discussed how untreated sleep apnea puts an individual at risk for cardiac arrhthymias, pulm HTN, DM, stroke and increases their risk for daytime accidents. We also briefly reviewed treatment options including weight loss, side sleeping position, oral appliance, CPAP therapy or referral to ENT for possible surgical options  Continue working on weight loss measures   Follow up in one year with Dr. Ander Slade or as needed

## 2022-07-05 NOTE — Assessment & Plan Note (Signed)
Refer to psychiatry, not actively suicidal or homicidal

## 2022-07-05 NOTE — Assessment & Plan Note (Signed)
Encourage continued weight loss measures.  She was previously on semaglutide; had to stop due to insurance coverage issues.  She is going to be getting a new insurance plan in September so hopes that she can restart weight loss medication at this point.  She is also looking into bariatric surgery.  She does feel like her fatigue and snoring have gotten better being down 20 pounds.

## 2022-07-05 NOTE — Assessment & Plan Note (Signed)
  Patient re-educated about  the importance of commitment to a  minimum of 150 minutes of exercise per week as able.  The importance of healthy food choices with portion control discussed, as well as eating regularly and within a 12 hour window most days. The need to choose "clean , green" food 50 to 75% of the time is discussed, as well as to make water the primary drink and set a goal of 64 ounces water daily.       07/05/2022   10:59 AM 07/02/2022    9:47 AM 06/28/2022    9:13 AM  Weight /BMI  Weight 311 lb 3.2 oz 311 lb 310 lb 6.4 oz  Height '5\' 7"'$  (1.702 m) '5\' 7"'$  (1.702 m) '5\' 7"'$  (1.702 m)  BMI 48.74 kg/m2 48.71 kg/m2 48.62 kg/m2

## 2022-07-05 NOTE — Assessment & Plan Note (Signed)
Uncontrolled and disabling pain, toradol 60 mg  and depo medrol 80 mg  IM in offoce

## 2022-07-05 NOTE — Therapy (Signed)
OUTPATIENT PHYSICAL THERAPY THORACOLUMBAR       Patient Name: Jennifer [name] MRN: [id number] DOB:1976-01-30, 46 y.o., female Today's Date: 07/05/2022   PT End of Session - 07/05/22 1427     Visit Number 14    Number of Visits 21    Date for PT Re-Evaluation 07/12/22    Authorization Type [employer]    PT Start Time 1430    PT Stop Time 1455    PT Time Calculation (min) 25 min    Activity Tolerance Patient tolerated treatment well    Behavior During Therapy [facility] for tasks assessed/performed               Past Medical History:  Diagnosis Date   Abnormal vaginal Pap smear    Acid reflux    Alopecia    Anxiety    Arthritis    Depression    Diabetes mellitus without complication (HCC)    prediabetic, takes no meds   Diverticulosis    Family history of diabetes mellitus    GERD (gastroesophageal reflux disease)    Hemorrhoids    History of recurrent UTIs    Hypertension    Obesity    Past Surgical History:  Procedure Laterality Date   APPENDECTOMY     COLONOSCOPY N/A 03/13/2014   Dr. Jena Gauss- grade 3 hemorrhoids, colonic diverticulosis bx= benign lymphoid polyp   COLONOSCOPY WITH PROPOFOL N/A 01/26/2021   Procedure: COLONOSCOPY WITH PROPOFOL;  Surgeon: Corbin Ade, MD;  Location: [facility];  Service: Endoscopy;  Laterality: N/A;  AM   KNEE ARTHROSCOPY     right   POLYPECTOMY  01/26/2021   Procedure: POLYPECTOMY;  Surgeon: Corbin Ade, MD;  Location: [facility];  Service: Endoscopy;;   Patient Active Problem List   Diagnosis Date Noted   Mild obstructive sleep apnea 07/05/2022   Tonsillitis 07/05/2022   Trigger finger, right middle finger 07/02/2022   Colovesical fistula 12/08/2021    Class: Question of   Osteoarthritis of both knees 12/03/2021   Controlled diabetes mellitus type 2 with complications (HCC) 09/09/2021   Generalized arthritis 09/09/2021   Snoring 07/06/2021   Financial difficulties 05/09/2021   Reduced vision 04/03/2021    Alternating constipation and diarrhea 12/16/2020   Hematochezia 09/09/2020   Right wrist pain 07/21/2020   Unsteady gait 07/14/2020   Poor urinary stream 05/27/2020   Depression, major, single episode, severe (HCC) 06/18/2019   Environmental allergies 06/18/2019   Candidiasis of skin 01/22/2019   Back pain 11/28/2017   Essential hypertension 03/22/2016   Diverticulosis 11/23/2015   Hemorrhoids 03/26/2015   Insomnia due to other mental disorder 07/10/2014   GAD (generalized anxiety disorder) 07/10/2014   FHx: cancer of digestive organ 02/16/2014   Piles (hemorrhoids) 02/14/2014   Abnormal finding on Pap smear, HPV DNA positive 11/29/2013   Allergic rhinitis 02/12/2013   Prediabetes 03/26/2012   Dyspepsia 03/22/2012   TUBERCULOSIS 11/02/2010   Alopecia (capitis) totalis 11/02/2010   Chronic fatigue 07/23/2009   Morbid obesity (HCC) 11/24/2007   Depression with anxiety 11/24/2007    PCP: Syliva Overman, MD  REFERRING PROVIDER: Gaynelle Adu, MD  REFERRING DIAG: E66.01 (ICD-10-CM) - Morbid (severe) obesity due to excess calories  Rationale for Evaluation and Treatment Rehabilitation  THERAPY DIAG:  Chronic pain of left knee  Chronic pain of right knee  Other low back pain  ONSET DATE: knees 1998, back shortly after  SUBJECTIVE:  SUBJECTIVE STATEMENT: A little less pain.   PERTINENT HISTORY:  OA, obesity, h/o CA, GAD, h/o falls  PAIN:  Are you having pain? Yes: NPRS scale: 3/10 Pain location: bil knees (Lt>Rt) Pain description: ache Aggravating factors: constant but worse with weight bearing Relieving factors: sitting    PRECAUTIONS: Fall  WEIGHT BEARING RESTRICTIONS No  FALLS:  Has patient fallen in last 6 months? No  LIVING ENVIRONMENT: Lives with: lives with their  daughter Lives in: House/apartment Stairs: steps to enter home Has following equipment at home:  walking stick / SPC  OCCUPATION: not working right now  PLOF: Independent  PATIENT GOALS decrease pain, return to exercise   OBJECTIVE:   DIAGNOSTIC FINDINGS:  Rt knee xray 12/09/21: X-rays demonstrate marked tricompartmental degenerative changes with  valgus deformity.  Overall windswept deformity Lt knee xray 12/09/21:X-rays reveal significant tricompartmental degenerative changes with varus  deformity.  There is also medial translation of the femur on the tibia.    Overall windswept deformity.   PATIENT SURVEYS:  FOTO 35 7/17: 33 7/31: 31 8/21: 38    POSTURE: flexed through pelvis, ant pelvic tilt, bil knee windswept to the Lt  LUMBAR ROM:   Able to demo trunk mobility in seated but in flexed position in standing    LOWER EXTREMITY MMT:  hand held dynamometry at eval  MMT Right eval Left eval Rt/Lt 7/17 Rt / Lt 7/31 Rt / Lt 8/21  Hip flexion 19.6 lb 18.3 28.6//31 36.3/ 30.5 38 / 25.4  Hip extension       Hip abduction       Hip adduction       Hip internal rotation       Hip external rotation       Knee flexion       Knee extension 24.1 28.2 27.8//35.3 31.0 / 23.3 33.4 / 26.4  Ankle dorsiflexion       Ankle plantarflexion       Ankle inversion       Ankle eversion        (Blank rows = not tested)   FUNCTIONAL TESTS:  EVAL: 5 times sit to stand: unable to stand without use of UEs SLS   unable  7/17: 5TSTS: 13s- use of Rt UE and only stood approx 50% of height SLS: able to shift weight to 3s of SLS with UE assist on PT hands  8/21: 5tsts: 16 without use of UEs, stood 75-100% of posture each time SLS: able to shift weight with minimal hold- more pain on Left  GAIT: SPC, slow cadence, upright posture, windswept knees Rt to Lt    TODAY'S TREATMENT:  See plan  PATIENT EDUCATION:  Education details: exercise form/rationale, aquatics Person  educated: Patient Education method: Programmer, multimedia, Facilities manager, Actor cues, Verbal cues, and Handouts Education comprehension: verbalized understanding, returned demonstration, verbal cues required, tactile cues required, and needs further education   HOME EXERCISE PROGRAM: [id number]  ASSESSMENT:  CLINICAL IMPRESSION: Pt has made significant improvements in strength and functional ability since beginning PT and is prepared for d/c to aquatic transition program and independent gym workouts. Plans to join the gym here and we discussed use of equipment. Encouraged her to reach out with any questions.     OBJECTIVE IMPAIRMENTS Abnormal gait, decreased activity tolerance, decreased balance, decreased endurance, decreased mobility, difficulty walking, decreased strength, improper body mechanics, postural dysfunction, obesity, and pain.   ACTIVITY LIMITATIONS carrying, lifting, bending, standing, stairs, transfers, and locomotion level  PARTICIPATION LIMITATIONS: meal prep,  cleaning, shopping, and community activity  PERSONAL FACTORS 1-2 comorbidities: h/o falls, GAD  are also affecting patient's functional outcome.   REHAB POTENTIAL: Fair    CLINICAL DECISION MAKING: Evolving/moderate complexity  EVALUATION COMPLEXITY: Moderate   GOALS: Goals reviewed with patient? Yes  SHORT TERM GOALS: Target date: 04/26/2022  Pt will find a proper working level in pool to tolerance of knees Baseline: Goal status:Achieved   LONG TERM GOALS: Target date: 06/20/22  Able to ambulate from car to pool without rest break Baseline: have done it before but lately getting WC due to knee pain Goal status: Achieved  2.  FOTO to meet stated goal Baseline:  Goal status: improved but not met  3.  Gross LE strength to increase by at least 25% from eval bilaterally via hand held dynamometry testing Baseline:  Goal status: achieved   4.  Pt will verbalize a notable increase in participation of ADLs and  more frequent mobility Baseline: Increase but continues to be limited- good days and bad days Goal status: partially met    PLAN: PT FREQUENCY: 1-2xw  PT DURATION: 4 weeks  PLANNED INTERVENTIONS: Therapeutic exercises, Therapeutic activity, Neuromuscular re-education, Balance training, Gait training, Patient/Family education, Joint mobilization, Aquatic Therapy, Dry Needling, Electrical stimulation, Spinal mobilization, Cryotherapy, Moist heat, Taping, Manual therapy, and Re-evaluation.  PHYSICAL THERAPY DISCHARGE SUMMARY  Visits from Start of Care: 14  Current functional level related to goals / functional outcomes: See above   Remaining deficits: See above   Education / Equipment: Anatomy of condition, POC, HEP, exercise form/rationale  Patient agrees to discharge. Patient goals were met. Patient is being discharged due to meeting the stated rehab goals.   Aislee Landgren C. Kenroy Timberman PT, DPT 07/05/22 3:00 PM

## 2022-07-05 NOTE — Assessment & Plan Note (Signed)
Recent ED visit after waking up with significant globus sensation.  She was treated for acute tonsillitis vs strep with antibiotics.  Has recovered well with resolution of symptoms.

## 2022-07-05 NOTE — Assessment & Plan Note (Signed)
Refer ortho for management

## 2022-07-05 NOTE — Progress Notes (Signed)
@Patient  ID: Jennifer [name], female    DOB: 02/08/1976, 46 y.o.   MRN: [id number]  Chief Complaint  Patient presents with   Follow-up    Follow up. Patient has no complaints.     Referring provider: [name], MD  HPI: 46 year old female, never smoker followed for mild OSA.  She is a patient of Dr. Trena Platt and last seen in office 09/02/2021 for initial sleep consult.  Past medical history significant for hypertension, allergic rhinitis, DM2, arthritis, morbid obesity, depression with anxiety.  TEST/EVENTS:  11/26/2021 HST: AHI 12.4, SPO2 low 83% 04/26/2022 CXR 2 view: Both lungs are clear  09/02/2021: Sleep consult with Dr. Wynona Neat.  Complains of daytime sleepiness and significant snoring.  3-4 night awakenings.  History of hypertension and diabetes which are well controlled.  Moderate likelihood of significant OSA.  HST ordered for further evaluation.  Encouraged about weight loss efforts.  07/05/2022: Today-follow-up Patient presents today for overdue follow-up.  Since being seen last, she underwent home sleep study which revealed mild obstructive sleep apnea with AHI 12.4.  She would like to discuss her potential treatment options.  She does continue to have daytime fatigue symptoms.  Wakes in the morning feeling like she did not rest well.  She also has a history of depression and feels like this contributes to her poor sleep as well.  Mood has been stable and no SI/HI.  She snores at night.  Does notice that this improves when she sleeps on her side.  Occasionally will nap during the day, if she has time.  Denies any morning headaches, drowsy driving, sleep parasomnias/paralysis, cataplexy.  She is actively working on weight loss measures.  She is down about 20 pounds since we saw her last.  She was previously on a weight loss medication but has stopped this because her insurance is about to change and her current insurance was not covering it.  She is also looking into bariatric  surgery.   She was recently in the ED for sensation of choking and URI symptoms.  She had woken up one morning and felt like something was stuck in the back of her throat.  Work-up revealed significant enlargement of her tonsils and upper uvula; treated for strep infection versus tonsillitis.  Since completing her antibiotics, she feels significantly better and has not had any more recurrence of the symptoms.  No Known Allergies  Immunization History  Administered Date(s) Administered   Influenza Split 08/13/2016   Influenza,inj,Quad PF,6+ Mos 07/10/2014, 10/02/2015, 09/01/2017, 10/19/2018, 07/26/2019, 07/18/2020, 09/09/2021, 07/02/2022   Moderna Sars-Covid-2 Vaccination 01/26/2020, 02/27/2020   PNEUMOCOCCAL CONJUGATE-20 09/09/2021   Td 07/23/2009   Tdap 05/27/2020    Past Medical History:  Diagnosis Date   Abnormal vaginal Pap smear    Acid reflux    Alopecia    Anxiety    Arthritis    Depression    Diabetes mellitus without complication (HCC)    prediabetic, takes no meds   Diverticulosis    Family history of diabetes mellitus    GERD (gastroesophageal reflux disease)    Hemorrhoids    History of recurrent UTIs    Hypertension    Obesity     Tobacco History: Social History   Tobacco Use  Smoking Status Never  Smokeless Tobacco Never   Counseling given: Not Answered   Outpatient Medications Prior to Visit  Medication Sig Dispense Refill   acetaminophen (TYLENOL) 500 MG tablet Take one tablet by mouth two times daily 60 tablet  5   acyclovir (ZOVIRAX) 800 MG tablet Take 1 tablet (800 mg total) by mouth 3 (three) times daily. 30 tablet 1   amLODipine (NORVASC) 5 MG tablet TAKE 1 TABLET (5 MG TOTAL) BY MOUTH DAILY. 90 tablet 1   busPIRone (BUSPAR) 15 MG tablet TAKE 1 TABLET BY MOUTH THREE TIMES A DAY 270 tablet 1   clotrimazole-betamethasone (LOTRISONE) cream APPLY TO AFFECTED AREA TWICE A DAY 45 g 1   fluticasone (FLONASE) 50 MCG/ACT nasal spray Place 2 sprays into  both nostrils daily. 16 g 6   gabapentin (NEURONTIN) 300 MG capsule Take 1 capsule (300 mg total) by mouth at bedtime. 30 capsule 3   hydrOXYzine (ATARAX/VISTARIL) 50 MG tablet TAKE ONE TABLET AT BEDTIME FOR SLEEP (Patient taking differently: Take 50 mg by mouth at bedtime as needed (sleep).) 90 tablet 0   megestrol (MEGACE) 40 MG tablet TAKE 2 TABLETS BY MOUTH EVERY DAY 60 tablet 3   meloxicam (MOBIC) 15 MG tablet Take 1 tablet (15 mg total) by mouth daily. 30 tablet 2   oxybutynin (DITROPAN-XL) 10 MG 24 hr tablet TAKE 1 TABLET BY MOUTH EVERYDAY AT BEDTIME 90 tablet 1   triamterene-hydrochlorothiazide (MAXZIDE) 75-50 MG tablet TAKE 1 TABLET BY MOUTH EVERY DAY 90 tablet 3   venlafaxine XR (EFFEXOR-XR) 150 MG 24 hr capsule TAKE 1 CAPSULE BY MOUTH DAILY WITH BREAKFAST. 90 capsule 3   Semaglutide-Weight Management 1.7 MG/0.75ML SOAJ Inject 1.7 mg into the skin once a week. (Patient not taking: Reported on 07/02/2022) 3 mL 2   No facility-administered medications prior to visit.     Review of Systems:   Constitutional: No weight loss or gain, night sweats, fevers, chills, or lassitude. +fatigue  HEENT: No headaches, difficulty swallowing, tooth/dental problems, or sore throat. No sneezing, itching, ear ache, nasal congestion, or post nasal drip CV:  No chest pain, orthopnea, PND, swelling in lower extremities, anasarca, dizziness, palpitations, syncope Resp: +snoring. No shortness of breath with exertion or at rest. No excess mucus or change in color of mucus. No productive or non-productive. No hemoptysis. No wheezing.  No chest wall deformity GU: No dysuria, change in color of urine, urgency or frequency.  No flank pain, no hematuria  Neuro: No dizziness or lightheadedness.  Psych: No depression or anxiety. Mood stable.     Physical Exam:  BP 130/86 (BP Location: Right Arm, Patient Position: Sitting, Cuff Size: Large)   Pulse (!) 110   Temp 98.4 F (36.9 C) (Oral)   Ht 5\' 7"  (1.702 m)    Wt (!) 311 lb 3.2 oz (141.2 kg)   SpO2 98%   BMI 48.74 kg/m   GEN: Pleasant, interactive, well-appearing; morbidly obese; in no acute distress HEENT:  Normocephalic and atraumatic. PERRLA. Sclera white. Nasal turbinates pink, moist and patent bilaterally. No rhinorrhea present. Oropharynx pink and moist, without exudate or edema. No lesions, ulcerations, or postnasal drip.  NECK:  Supple w/ fair ROM. No JVD present. Normal carotid impulses w/o bruits. Thyroid symmetrical with no goiter or nodules palpated. No lymphadenopathy.   CV: RRR, no m/r/g, no peripheral edema. Pulses intact, +2 bilaterally. No cyanosis, pallor or clubbing. PULMONARY:  Unlabored, regular breathing. Clear bilaterally A&P w/o wheezes/rales/rhonchi. No accessory muscle use. No dullness to percussion. GI: BS present and normoactive. Soft, non-tender to palpation. No organomegaly or masses detected. No CVA tenderness. MSK: No erythema, warmth or tenderness. Cap refil <2 sec all extrem. No deformities or joint swelling noted.  Neuro: A/Ox3. No focal deficits  noted.   Skin: Warm, no lesions or rashe Psych: Normal affect and behavior. Judgement and thought content appropriate.     Lab Results:  CBC    Component Value Date/Time   WBC 8.1 12/03/2021 1018   WBC 6.0 05/29/2020 1045   RBC 4.42 12/03/2021 1018   RBC 4.61 05/29/2020 1045   HGB 13.4 12/03/2021 1018   HCT 39.4 12/03/2021 1018   PLT 385 12/03/2021 1018   MCV 89 12/03/2021 1018   MCH 30.3 12/03/2021 1018   MCH 30.6 05/29/2020 1045   MCHC 34.0 12/03/2021 1018   MCHC 33.4 05/29/2020 1045   RDW 13.2 12/03/2021 1018   LYMPHSABS 2.9 10/28/2015 1043   MONOABS 0.4 10/28/2015 1043   EOSABS 0.2 10/28/2015 1043   BASOSABS 0.0 10/28/2015 1043    BMET    Component Value Date/Time   NA 141 01/28/2022 0815   NA 139 12/03/2021 1018   K 4.1 01/28/2022 0815   CL 103 01/28/2022 0815   CO2 20 01/28/2022 0815   GLUCOSE 96 01/28/2022 0815   BUN 16 01/28/2022 0815    BUN 14 12/03/2021 1018   CREATININE 1.37 (H) 01/28/2022 0815   CALCIUM 10.4 (H) 01/28/2022 0815   GFRNONAA >60 11/25/2020 0918   GFRNONAA 79 09/02/2020 1133   GFRAA 91 09/02/2020 1133    BNP No results found for: "BNP"   Imaging:  No results found.  ketorolac (TORADOL) injection 60 mg     Date Action Dose Route User   07/02/2022 1139 Given 60 mg Intramuscular (Left Upper Outer Quadrant) Hudy, Brandi H, LPN      methylPREDNISolone acetate (DEPO-MEDROL) injection 80 mg     Date Action Dose Route User   07/02/2022 1140 Given 80 mg Intramuscular (Left Upper Outer Quadrant) Hudy, Brandi H, LPN           No data to display          No results found for: "NITRICOXIDE"      Assessment & Plan:   Mild obstructive sleep apnea She has mild obstructive sleep apnea with AHI 12.4/h. We discussed how untreated sleep apnea puts an individual at risk for cardiac arrhthymias, pulm HTN, DM, stroke and increases their risk for daytime accidents; although, these are less likely with mild severity. We also briefly reviewed treatment options including weight loss, side sleeping position, oral appliance, CPAP therapy or referral to ENT for possible surgical options.  She would like to hold off on CPAP therapy at this point.  Wants to move forward with watchful waiting and continue to work on weight loss efforts and use a side-lying sleeping position.  Caution to be aware of and avoid drowsy driving.  Patient Instructions  We discussed how untreated sleep apnea puts an individual at risk for cardiac arrhthymias, pulm HTN, DM, stroke and increases their risk for daytime accidents. We also briefly reviewed treatment options including weight loss, side sleeping position, oral appliance, CPAP therapy or referral to ENT for possible surgical options  Continue working on weight loss measures   Follow up in one year with Dr. Wynona Neat or as needed     Morbid obesity (HCC) Encourage continued  weight loss measures.  She was previously on semaglutide; had to stop due to insurance coverage issues.  She is going to be getting a new insurance plan in September so hopes that she can restart weight loss medication at this point.  She is also looking into bariatric surgery.  She does feel like  her fatigue and snoring have gotten better being down 20 pounds.  Tonsillitis Recent ED visit after waking up with significant globus sensation.  She was treated for acute tonsillitis vs strep with antibiotics.  Has recovered well with resolution of symptoms.   I spent 32 minutes of dedicated to the care of this patient on the date of this encounter to include pre-visit review of records, face-to-face time with the patient discussing conditions above, post visit ordering of testing, clinical documentation with the electronic health record, making appropriate referrals as documented, and communicating necessary findings to members of the patients care team.  Noemi Chapel, NP 07/05/2022  Pt aware and understands NP's role.

## 2022-07-06 ENCOUNTER — Other Ambulatory Visit: Payer: Self-pay | Admitting: Family Medicine

## 2022-07-08 ENCOUNTER — Other Ambulatory Visit: Payer: Self-pay | Admitting: Licensed Clinical Social Worker

## 2022-07-08 ENCOUNTER — Ambulatory Visit: Payer: 59 | Admitting: Licensed Clinical Social Worker

## 2022-07-08 DIAGNOSIS — F322 Major depressive disorder, single episode, severe without psychotic features: Secondary | ICD-10-CM

## 2022-07-08 DIAGNOSIS — I10 Essential (primary) hypertension: Secondary | ICD-10-CM

## 2022-07-08 DIAGNOSIS — M170 Bilateral primary osteoarthritis of knee: Secondary | ICD-10-CM

## 2022-07-08 DIAGNOSIS — E118 Type 2 diabetes mellitus with unspecified complications: Secondary | ICD-10-CM

## 2022-07-08 DIAGNOSIS — F411 Generalized anxiety disorder: Secondary | ICD-10-CM

## 2022-07-08 NOTE — Patient Instructions (Signed)
Visit Information  Thank you for taking time to visit with me today. Please don't hesitate to contact me if I can be of assistance to you before our next scheduled telephone appointment.  Following are the goals we discussed today:   Member of Managed Medicaid team to call client in next 30 days  Please call the care guide team at [phone] if you need to cancel or reschedule your appointment.   If you are experiencing a Mental Health or [place] or need someone to talk to, please call the [facility]: [phone]   Following is a copy of your full plan of care:  Care Plan : Drain  Updates made by Jennifer Cabal, LCSW since 07/08/2022 12:00 AM     Problem: Emotional Distress      Goal: Emotional Health Supported.  Manage Depression issues. Manage Anxiety issues. Manage financial needs   Start Date: 02/22/2022  Expected End Date: 08/05/2022  This Visit's Progress: On track  Recent Progress: On track  Priority: Medium  Note:   Current Barriers:  Financial barriers Depression issues Anxiety issues Suicidal Ideation/Homicidal Ideation: No  Clinical Social Work Goal(s):  patient will work with SW  in next 30 days by telephone or in person to reduce or manage symptoms related to depression or anxiety  Client to attend scheduled medical appointments in next 30 days Client to communicate with RNCM as needed for nursing support Client will talk with SW in next 30 days about financial needs of client  Interventions: 1:1 collaboration with Jennifer Helper, MD regarding development and update of comprehensive plan of care as evidenced by provider attestation and co-signature Discussed client needs with Jennifer [name] Provided counseling support for client Discussed her insurance coverage. She said she has [employer] but that it will end on 07/15/22. She spoke with representative at [place] in [facility] and was told she has  full Medicaid. She did not realize she had full Medicaid.   Discussed that client received call from Man[employer] Medicaid LCSW Jennifer [name] today and that Jennifer [name] and Jennifer [name] had talked of client needs. Again, client was informed recently that she had full Medicaid. LCSW Jennifer [name] thanked Jennifer [name] for phone calls of client with LCSW Jennifer [name] as part of Care Coordination program.  LCSW Jennifer [name] informed Jennifer [name] that member of Managed Medicaid team would contact her within next 30 days to further discuss her needs at that time. Client agreed to plan.  Patient Self Care Activities:  Attends medical appointments  Patient Coping Strengths:  Has emotional support from nephew, Jennifer [name]  Patient Self Care Deficits:  Financial challenges Sleep issues Depression issues Anxiety issues  Patient Goals:  - spend time or talk with others at least 2 to 3 times per week - practice relaxation or meditation daily - keep a calendar with appointment dates  Follow Up Plan: Client to receive phone call from member of Managed Medicaid team within the next 30 days to talk with Jennifer [name] about her care needs.      Jennifer [name] was given information about Care Management services by the embedded care coordination team including:  Care Management services include personalized support from designated clinical staff supervised by her physician, including individualized plan of care and coordination with other care providers 24/7 contact phone numbers for assistance for urgent and routine care needs. The patient may stop CCM services at any time (effective at the end of the month) by phone  call to the office staff.  Patient agreed to services and verbal consent obtained.   Jennifer [name].Jennifer [name] MSW, [place] representative [facility] Care Management [phone]

## 2022-07-08 NOTE — Chronic Care Management (AMB) (Signed)
Care Management Clinical Social Work Note  07/08/2022 Name: Jennifer [name] MRN: [id number] DOB: May 09, 1976  Jennifer [name] is a 46 y.o. year old female who is a primary care patient of Jennifer Perches, MD.  The Care Management team was consulted for assistance with chronic disease management and coordination needs.  Engaged with patient by telephone for follow up visit in response to provider referral for social work chronic care management and care coordination services  Consent to Services:  Jennifer [name] was given information about Care Management services today including:  Care Management services includes personalized support from designated clinical staff supervised by her physician, including individualized plan of care and coordination with other care providers 24/7 contact phone numbers for assistance for urgent and routine care needs. The patient may stop case management services at any time by phone call to the office staff.  Patient agreed to services and consent obtained.   Assessment: Review of patient past medical history, allergies, medications, and health status, including review of relevant consultants reports was performed today as part of a comprehensive evaluation and provision of chronic care management and care coordination services.  SDOH (Social Determinants of Health) assessments and interventions performed:  SDOH Interventions    Flowsheet Row Most Recent Value  SDOH Interventions   Stress Interventions Provide Counseling  [client has stress related to managing medical needs]  Depression Interventions/Treatment  Counseling        Advanced Directives Status: See Vynca application for related entries.  Care Plan  No Known Allergies  Outpatient Encounter Medications as of 07/08/2022  Medication Sig   acetaminophen (TYLENOL) 500 MG tablet Take one tablet by mouth two times daily   acyclovir (ZOVIRAX) 800 MG tablet Take 1 tablet (800 mg total) by mouth 3  (three) times daily.   amLODipine (NORVASC) 5 MG tablet TAKE 1 TABLET (5 MG TOTAL) BY MOUTH DAILY.   busPIRone (BUSPAR) 15 MG tablet TAKE 1 TABLET BY MOUTH THREE TIMES A DAY   clotrimazole-betamethasone (LOTRISONE) cream APPLY TO AFFECTED AREA TWICE A DAY   fluticasone (FLONASE) 50 MCG/ACT nasal spray Place 2 sprays into both nostrils daily.   gabapentin (NEURONTIN) 300 MG capsule Take 1 capsule (300 mg total) by mouth at bedtime.   hydrOXYzine (ATARAX/VISTARIL) 50 MG tablet TAKE ONE TABLET AT BEDTIME FOR SLEEP (Patient taking differently: Take 50 mg by mouth at bedtime as needed (sleep).)   megestrol (MEGACE) 40 MG tablet TAKE 2 TABLETS BY MOUTH EVERY DAY   meloxicam (MOBIC) 15 MG tablet Take 1 tablet (15 mg total) by mouth daily.   oxybutynin (DITROPAN-XL) 10 MG 24 hr tablet TAKE 1 TABLET BY MOUTH EVERYDAY AT BEDTIME   Semaglutide-Weight Management 1.7 MG/0.75ML SOAJ Inject 1.7 mg into the skin once a week. (Patient not taking: Reported on 07/02/2022)   triamterene-hydrochlorothiazide (MAXZIDE) 75-50 MG tablet TAKE 1 TABLET BY MOUTH EVERY DAY   venlafaxine XR (EFFEXOR-XR) 150 MG 24 hr capsule TAKE 1 CAPSULE BY MOUTH DAILY WITH BREAKFAST.   No facility-administered encounter medications on file as of 07/08/2022.    Patient Active Problem List   Diagnosis Date Noted   Mild obstructive sleep apnea 07/05/2022   Tonsillitis 07/05/2022   Trigger finger, right middle finger 07/02/2022   Colovesical fistula 12/08/2021    Class: Question of   Osteoarthritis of both knees 12/03/2021   Controlled diabetes mellitus type 2 with complications (HCC) 09/09/2021   Generalized arthritis 09/09/2021   Snoring 07/06/2021   Financial difficulties 05/09/2021  Reduced vision 04/03/2021   Alternating constipation and diarrhea 12/16/2020   Hematochezia 09/09/2020   Right wrist pain 07/21/2020   Unsteady gait 07/14/2020   Poor urinary stream 05/27/2020   Depression, major, single episode, severe (HCC)  06/18/2019   Environmental allergies 06/18/2019   Candidiasis of skin 01/22/2019   Back pain 11/28/2017   Essential hypertension 03/22/2016   Diverticulosis 11/23/2015   Hemorrhoids 03/26/2015   Insomnia due to other mental disorder 07/10/2014   GAD (generalized anxiety disorder) 07/10/2014   FHx: cancer of digestive organ 02/16/2014   Piles (hemorrhoids) 02/14/2014   Abnormal finding on Pap smear, HPV DNA positive 11/29/2013   Allergic rhinitis 02/12/2013   Prediabetes 03/26/2012   Dyspepsia 03/22/2012   TUBERCULOSIS 11/02/2010   Alopecia (capitis) totalis 11/02/2010   Chronic fatigue 07/23/2009   Morbid obesity (HCC) 11/24/2007   Depression with anxiety 11/24/2007    Conditions to be addressed/monitored: monitor client management of depression issues  Care Plan : LCSW Care Plan  Updates made by Isaiah Blakes, LCSW since 07/08/2022 12:00 AM     Problem: Emotional Distress      Goal: Emotional Health Supported.  Manage Depression issues. Manage Anxiety issues. Manage financial needs   Start Date: 02/22/2022  Expected End Date: 08/05/2022  This Visit's Progress: On track  Recent Progress: On track  Priority: Medium  Note:   Current Barriers:  Financial barriers Depression issues Anxiety issues Suicidal Ideation/Homicidal Ideation: No  Clinical Social Work Goal(s):  patient will work with SW  in next 30 days by telephone or in person to reduce or manage symptoms related to depression or anxiety  Client to attend scheduled medical appointments in next 30 days Client to communicate with RNCM as needed for nursing support Client will talk with SW in next 30 days about financial needs of client  Interventions: 1:1 collaboration with Jennifer Perches, MD regarding development and update of comprehensive plan of care as evidenced by provider attestation and co-signature Discussed client needs with Shaketta Kubin Provided counseling support for client Discussed her  insurance coverage. She said she has [employer] Plan but that it will end on 07/15/22. She spoke with representative at DSS in [facility] and was told she has full Medicaid. She did not realize she had full Medicaid.   Discussed that client received call from Managed Medicaid LCSW Dickie La today and that Andilyn and Nehemiah Settle had talked of client needs. Again, client was informed recently that she had full Medicaid. LCSW Lorna Few thanked Sherrica for phone calls of client with LCSW Lorna Few as part of Care Coordination program.  LCSW Lorna Few informed Eastyn that member of Managed Medicaid team would contact her within next 30 days to further discuss her needs at that time. Client agreed to plan.  Patient Self Care Activities:  Attends medical appointments  Patient Coping Strengths:  Has emotional support from nephew, Seirra Gutierres  Patient Self Care Deficits:  Financial challenges Sleep issues Depression issues Anxiety issues  Patient Goals:  - spend time or talk with others at least 2 to 3 times per week - practice relaxation or meditation daily - keep a calendar with appointment dates  Follow Up Plan: Client to receive phone call from member of Managed Medicaid team within the next 30 days to talk with Lyndsee about her care needs.      Kelton Pillar.Tiandre Teall MSW, LCSW Licensed Visual merchandiser [facility] Care Management [phone]

## 2022-07-08 NOTE — Patient Outreach (Signed)
Medicaid Managed Care Social Work Note  07/08/2022 Name:  Jennifer [name] MRN:  [id number] DOB:  [birth year]  Jennifer [name] is an 46 y.o. year old female who is a primary patient of [name], MD.  The [facility] Managed Care Coordination team was consulted for assistance with:  Disease Management and care coordination needs  Ms. Sergi was given information about Medicaid Managed Care Coordination team services today. Betzy Deirdre Priest Patient agreed to services and verbal consent obtained. Patient reports only needing pharmacy assistance at this time.   Engaged with patient  for by telephone forinitial visit in response to referral for case management and/or care coordination services.   Assessments/Interventions:  Review of past medical history, allergies, medications, health status, including review of consultants reports, laboratory and other test data, was performed as part of comprehensive evaluation and provision of chronic care management services.  SDOH: (Social Determinant of Health) assessments and interventions performed:   Advanced Directives Status:  Not addressed in this encounter.  Care Plan                 No Known Allergies  Medications Reviewed Today     Reviewed by [name], MD (Physician) on 07/05/22 at 2228  Med List Status: <None>   Medication Order Taking? Sig Documenting Provider Last Dose Status Informant  acetaminophen (TYLENOL) 500 MG tablet [id number] Yes Take one tablet by mouth two times daily [name], MD  Active   acyclovir (ZOVIRAX) 800 MG tablet [id number] Yes Take 1 tablet (800 mg total) by mouth 3 (three) times daily. [name], MD Taking Active   amLODipine (NORVASC) 5 MG tablet [id number] Yes TAKE 1 TABLET (5 MG TOTAL) BY MOUTH DAILY. [name], MD Taking Active   busPIRone (BUSPAR) 15 MG tablet [id number] Yes TAKE 1 TABLET BY MOUTH THREE TIMES A DAY [name], MD Taking Active    clotrimazole-betamethasone [facility]) cream [id number] Yes APPLY TO AFFECTED AREA TWICE A DAY [name], MD Taking Active   fluticasone [facility]) 50 MCG/ACT nasal spray [id number]  Place 2 sprays into both nostrils daily. [name], MD  Active   gabapentin (NEURONTIN) 300 MG capsule [id number] Yes Take 1 capsule (300 mg total) by mouth at bedtime. [name], MD  Active   hydrOXYzine (ATARAX/VISTARIL) 50 MG tablet [id number] Yes TAKE ONE TABLET AT BEDTIME FOR SLEEP  Patient taking differently: Take 50 mg by mouth at bedtime as needed (sleep).   [name], MD Taking Active Self  megestrol (MEGACE) 40 MG tablet [id number] Yes TAKE 2 TABLETS BY MOUTH EVERY DAY Myna Hidalgo, DO Taking Active   meloxicam (MOBIC) 15 MG tablet [id number] Yes Take 1 tablet (15 mg total) by mouth daily. [name], MD  Active   oxybutynin (DITROPAN-XL) 10 MG 24 hr tablet [id number] Yes TAKE 1 TABLET BY MOUTH EVERYDAY AT BEDTIME [name], MD Taking Active   Semaglutide-Weight Management 1.7 MG/0.75ML SOAJ [id number] No Inject 1.7 mg into the skin once a week.  Patient not taking: Reported on 07/02/2022   [name], MD Not Taking Active   triamterene-hydrochlorothiazide [facility]) 75-50 MG tablet [id number] Yes TAKE 1 TABLET BY MOUTH EVERY DAY [name], MD Taking Active   venlafaxine XR (EFFEXOR-XR) 150 MG 24 hr capsule [id number] Yes TAKE 1 CAPSULE BY MOUTH DAILY WITH BREAKFAST. [name], MD Taking Active             Patient  Active Problem List   Diagnosis Date Noted   Mild obstructive sleep apnea 07/05/2022   Tonsillitis 07/05/2022   Trigger finger, right middle finger 07/02/2022   Colovesical fistula 12/08/2021    Class: Question of   Osteoarthritis of both knees 12/03/2021   Controlled diabetes mellitus type 2 with complications (HCC) 09/09/2021   Generalized arthritis 09/09/2021   Snoring 07/06/2021   Financial difficulties  05/09/2021   Reduced vision 04/03/2021   Alternating constipation and diarrhea 12/16/2020   Hematochezia 09/09/2020   Right wrist pain 07/21/2020   Unsteady gait 07/14/2020   Poor urinary stream 05/27/2020   Depression, major, single episode, severe (HCC) 06/18/2019   Environmental allergies 06/18/2019   Candidiasis of skin 01/22/2019   Back pain 11/28/2017   Essential hypertension 03/22/2016   Diverticulosis 11/23/2015   Hemorrhoids 03/26/2015   Insomnia due to other mental disorder 07/10/2014   GAD (generalized anxiety disorder) 07/10/2014   FHx: cancer of digestive organ 02/16/2014   Piles (hemorrhoids) 02/14/2014   Abnormal finding on Pap smear, HPV DNA positive 11/29/2013   Allergic rhinitis 02/12/2013   Prediabetes 03/26/2012   Dyspepsia 03/22/2012   TUBERCULOSIS 11/02/2010   Alopecia (capitis) totalis 11/02/2010   Chronic fatigue 07/23/2009   Morbid obesity (HCC) 11/24/2007   Depression with anxiety 11/24/2007   There are no care plans that you recently modified to display for this patient.   Follow up:  Patient agrees to Care Plan and Follow-up.  Plan: The Managed Medicaid care management team will reach out to the patient again over the next 30 days.  Dickie La, BSW, MSW, [employer] Managed Medicaid LCSW [facility]  [facility] [email or web address] Phone: [phone]

## 2022-07-13 ENCOUNTER — Telehealth: Payer: Self-pay | Admitting: Pharmacist

## 2022-07-13 NOTE — Progress Notes (Signed)
Received message from LCSW that patient is interested in medication review. Called patient today to schedule. She reports that she is most interesting in a psychiatrist for medication management. I'll communicate this back to LCSW. We did schedule a call for medication review on Thursday.   [name], PharmD, [facility] [phone]

## 2022-07-15 ENCOUNTER — Other Ambulatory Visit: Payer: Self-pay | Admitting: Licensed Clinical Social Worker

## 2022-07-15 ENCOUNTER — Other Ambulatory Visit: Payer: 59 | Admitting: Pharmacist

## 2022-07-15 NOTE — Progress Notes (Addendum)
Chief Complaint  Patient presents with   Medication Management   Hypertension    Jennifer [name] is a 46 y.o. year old female who presented for a telephone visit.   They were referred to the pharmacist by their High Risk Managed Medicaid Care Team  for assistance in managing complex medication management.   Patient is participating in a Managed Medicaid Plan:  Yes  Subjective:  Care Team: Primary Care Provider: [name], MD ; Next Scheduled Visit: 09/10/22 Cardiologist: Harl Bowie; Next Scheduled Visit: 08/11/22  Medication Access/Adherence  [facility]  [id number] [place] [place] [address]Phone: [phone]: [phone] [id number] [place]: [phone]: [phone] Patient reports affordability concerns with their medications: No  Patient reports access/transportation concerns to their pharmacy: Yes  Patient reports adherence concerns with their medications:  No  reports that her Friday Health plan ends today, a new plan with [employer] tomorrow. She also has Healthy Bl[employer]ache CorporationShe is pursuing bariatric surgery, and is concerned about whether insurance will cover this.    Diabetes:  Current medications: none at this time Medications tried in the past: Mounjaro, metformin  Reports significant weight loss benefit with Mounjaro, help with managing appetite and emotional eating. Insurance stopped covering [employer]  Hypertension:  Current medications: amlodipine 5 mg daily, triamterene/HCTZ 75/50 mg daily  Patient does not have a validated, automated, upper arm home BP cuff   Depression/Anxiety:  Current medications: venalfaxine 150 mg daily, buspirone 15 mg PRN, hydroxyzine 50 mg QPM PRN  Reports struggling with depression/anxiety right now. Referral placed to psychiatry at  last PCP visit, but I cannot see status of this referral   OA Pain: Current medications: gabapentin 300 mg QPM, meloxicam 15 mg daily  Reports significant stiffness and inability to move if she misses meloxicam. Used to do water aerobics but is unsure what will be covered with her new insurance.  OAB: Current medications: oxybutynin XL 10 mg daily   Vaginal Bleeding: Current medications: megestrol 80 mg daily - patient reports she is not bleeding at this time  Denies any increase in appetite with megestrol  Health Maintenance  Health Maintenance Due  Topic Date Due   COVID-19 Vaccine (3 - Moderna series) 04/23/2020     Objective: Lab Results  Component Value Date   HGBA1C 5.7 07/02/2022    Lab Results  Component Value Date   CREATININE 1.37 (H) 01/28/2022   BUN 16 01/28/2022   NA 141 01/28/2022   K 4.1 01/28/2022   CL 103 01/28/2022   CO2 20 01/28/2022    Lab Results  Component Value Date   CHOL 152 05/06/2021   HDL 49 05/06/2021   LDLCALC 88 05/06/2021   TRIG 79 05/06/2021   CHOLHDL 3.1 05/06/2021    Medications Reviewed Today     Reviewed by HaOsker MasonRPH-CPP (Pharmacist) on 07/15/22 at 1048Med List Status: <None>   Medication Order Taking? Sig Documenting Provider Last Dose Status Informant  acetaminophen (TYLENOL) 500 MG tablet [id number] Take one tablet by mouth two times daily SiFayrene HelperMD Taking Active   acyclovir (ZOVIRAX) 800 MG tablet [id number] Take 1 tablet (800 mg total) by mouth 3 (three) times daily.  Patient not taking: Reported on 07/15/2022   SiFayrene HelperMD Not Taking Active  amLODipine (NORVASC) 5 MG tablet [id number] Yes TAKE 1 TABLET (5 MG TOTAL) BY MOUTH DAILY. [name], MD Taking Active   busPIRone (BUSPAR) 15 MG tablet [id number] Yes TAKE 1 TABLET BY MOUTH THREE TIMES A DAY [name], MD Taking Active   clotrimazole-betamethasone (LOTRISONE) cream [id number]  APPLY TO AFFECTED  AREA TWICE A DAY [name], MD  Active   fluticasone [facility]) 50 MCG/ACT nasal spray [id number] Yes Place 2 sprays into both nostrils daily. [name], MD Taking Active   gabapentin (NEURONTIN) 300 MG capsule [id number] Yes Take 1 capsule (300 mg total) by mouth at bedtime. [name], MD Taking Active   hydrOXYzine (ATARAX/VISTARIL) 50 MG tablet [id number] Yes TAKE ONE TABLET AT BEDTIME FOR SLEEP  Patient taking differently: Take 50 mg by mouth at bedtime as needed (sleep).   [name], MD Taking Active Self  megestrol (MEGACE) 40 MG tablet [id number] Yes TAKE 2 TABLETS BY MOUTH EVERY DAY Janyth Pupa, DO Taking Active   meloxicam (MOBIC) 15 MG tablet [id number] Yes Take 1 tablet (15 mg total) by mouth daily. [name], MD Taking Active   oxybutynin (DITROPAN-XL) 10 MG 24 hr tablet [id number] Yes TAKE 1 TABLET BY MOUTH EVERYDAY AT BEDTIME [name], MD Taking Active   triamterene-hydrochlorothiazide [facility]) 75-50 MG tablet [id number] Yes TAKE 1 TABLET BY MOUTH EVERY DAY [name], MD Taking Active   venlafaxine XR (EFFEXOR-XR) 150 MG 24 hr capsule [id number] Yes TAKE 1 CAPSULE BY MOUTH DAILY WITH BREAKFAST. [name], MD Taking Active               Assessment/Plan:   Diabetes with Obesity : - Currently controlled per last A1c, but that was on therapy - Reviewed Medicaid coverage. They would cover Ozempic. Medicaid requires trial and failure of 1-2 + preferred GLP1 (Ozempic, Trulicity, or Victoza) before they will cover Mounjaro. Patient is unsure if her [employer] plan will cover Mounjaro. Will collaborate with PCP to pursue Ozempic coverage on Medicaid in case patient's insurance does not cover Mounjaro for T2DM.   Hypertension: - Currently controlled - Will pursue script for BP cuff under Medicaid moving forward - Recommend to continue current regimen at this time  Depression/Anxiety: - Currently uncontrolled -  Will collaborate with LCSW to follow up on psychiatry referral - Recommend to continue current regimen at this time  OA Pain: - Benefit from pharmacotherapy, but would benefit from re-engagement with physical therapy - Continue current regimen at this time  OAB: - Continue current regimen at this time  Vaginal Bleeding: - Concern for long term embolic complications of megestrol therapy, especially given risk factors of obesity and limited mobility from OA. Will reach out to GYN.  Follow Up Plan: phone call next week  Catie TJodi Mourning, PharmD, [facility] [phone]

## 2022-07-15 NOTE — Patient Instructions (Signed)
Jennifer [name],   It was great talking with you today!  I will reach out to Dr. Moshe Cipro about Ozempic - we know Medicaid will cover Ozempic for diabetes if you have tried metformin before.   When you get your new insurance information, call them and see if they cover [facility] for diabetes (and what the requirements are for coverage) or Wegovy (which is the same medication as Ozempic).   Medicaid requires you to try and fail 1-2 preferred agents (so Ozempic and/or other medications in this class like Trulicity or Victoza) before they will cover Mounjaro.   I'll ask [name] (the licensed clinical Education officer, museum) to follow up on your referral to psychiatry.   I would advise you to reach out to GYN about the duration of therapy for the megestrol.  Please reach out with any questions. Thanks!  [name], PharmD, [facility] [phone]

## 2022-07-15 NOTE — Patient Instructions (Signed)
Visit Information  Jennifer [name] was given information about Medicaid Managed Care team care coordination services as a part of their Healthy [facility] Medicaid benefit. Jennifer [name] verbally consented to engagement with the [facility] Managed Care team.   If you are experiencing a medical emergency, please call 911 or report to your local emergency department or urgent care.   If you have a non-emergency medical problem during routine business hours, please contact your provider's office and ask to speak with a nurse.   For questions related to your Healthy [facility] health plan, please call: [phone] or visit the homepage here: [email or web address]  If you would like to schedule transportation through your Healthy [facility] plan, please call the following number at least 2 days in advance of your appointment: [phone]  For information about your ride after you set it up, call Ride Assist at [phone]. Use this number to activate a Will Call pickup, or if your transportation is late for a scheduled pickup. Use this number, too, if you need to make a change or cancel a previously scheduled reservation.  If you need transportation services right away, call [phone]. The after-hours call center is staffed 24 hours to handle ride assistance and urgent reservation requests (including discharges) 365 days a year. Urgent trips include sick visits, hospital discharge requests and life-sustaining treatment.  Call the [place] at [phone], at any time, 24 hours a day, 7 days a week. If you are in danger or need immediate medical attention call 911.  If you would like help to quit smoking, call 1-800-QUIT-NOW 214-179-6176) OR Espaol: 1-855-Djelo-Ya (9-163-846-6599) o para ms informacin haga clic aqu or Text READY to 200-400 to register via text  Following is a copy of your plan of care:  Care Plan : Jennifer [name]   Updates made by Jennifer Cutter, LCSW since 07/15/2022 12:00 AM     Problem: Emotional Distress      Goal: Emotional Health Supported.  Manage Depression issues. Manage Anxiety issues. Manage financial needs   Start Date: 02/22/2022  Expected End Date: 08/05/2022  Recent Progress: On track  Priority: Medium  Note:   Current Barriers:  Financial barriers Depression issues Anxiety issues Suicidal Ideation/Homicidal Ideation: No  Clinical Social Work Goal(s):  patient will work with SW  in next 30 days by telephone or in person to reduce or manage symptoms related to depression or anxiety  Client to attend scheduled medical appointments in next 30 days Client to communicate with RNCM as needed for nursing support Client will talk with SW in next 30 days about financial needs of client  Patient Self Care Activities:  Attends medical appointments  Patient Coping Strengths:  Has emotional support from nephew, Jennifer [name]  Patient Self Care Deficits:  Financial challenges Sleep issues Depression issues Anxiety issues  Patient Goals:  - spend time or talk with others at least 2 to 3 times per week - practice relaxation or meditation daily - keep a calendar with appointment dates  10 LITTLE Things To Do When You're Feeling Too Down To Do Anything  Take a shower. Even if you plan to stay in all day long and not see a soul, take a shower. It takes the most effort to hop in to the shower but once you do, you'll feel immediate results. It will wake you up and you'll be feeling much fresher (and cleaner too).  Brush and floss your teeth. Give your teeth a good brushing with  a floss finish. It's a small task but it feels so good and you can check 'taking care of your health' off the list of things to do.  Do something small on your list. Most of [place] have some small thing we would like to get done (load of laundry, sew a button, email a friend). Doing one of these things will make  you feel like you've accomplished something.  Drink water. Drinking water is easy right? It's also really beneficial for your health so keep a glass beside you all day and take sips often. It gives you energy and prevents you from boredom eating.  Do some floor exercises. The last thing you want to do is exercise but it might be just the thing you need the most. Keep it simple and do exercises that involve sitting or laying on the floor. Even the smallest of exercises release chemicals in the brain that make you feel good. Yoga stretches or core exercises are going to make you feel good with minimal effort.  Make your bed. Making your bed takes a few minutes but it's productive and you'll feel relieved when it's done. An unmade bed is a huge visual reminder that you're having an unproductive day. Do it and consider it your housework for the day.  Put on some nice clothes. Take the sweatpants off even if you don't plan to go anywhere. Put on clothes that make you feel good. Take a look in the mirror so your brain recognizes the sweatpants have been replaced with clothes that make you look great. It's an instant confidence booster.  Wash the dishes. A pile of dirty dishes in the sink is a reflection of your mood. It's possible that if you wash up the dishes, your mood will follow suit. It's worth a try.  Cook a real meal. If you have the luxury to have a "do nothing" day, you have time to make a real meal for yourself. Make a meal that you love to eat. The process is good to get you out of the funk and the food will ensure you have more energy for tomorrow.  Write out your thoughts by hand. When you hand write, you stimulate your brain to focus on the moment that you're in so make yourself comfortable and write whatever comes into your mind. Put those thoughts out on paper so they stop spinning around in your head. Those thoughts might be the very thing holding you down.  The following coping  skill education was provided for stress relief and mental health management: "When your car dies or a deadline looms, how do you respond? Long-term, low-grade or acute stress takes a serious toll on your body and mind, so don't ignore feelings of constant tension. Stress is a natural part of life. However, too much stress can harm our health, especially if it continues every day. This is chronic stress and can put you at risk for heart problems like heart disease and depression. Understand what's happening inside your body and learn simple coping skills to combat the negative impacts of everyday stressors.  Types of Stress There are two types of stress: Emotional - types of emotional stress are relationship problems, pressure at work, financial worries, experiencing discrimination or having a major life change. Physical - Examples of physical stress include being sick having pain, not sleeping well, recovery from an injury or having an alcohol and drug use disorder. Fight or Flight Sudden or ongoing stress activates your nervous  system and floods your bloodstream with adrenaline and cortisol, two hormones that raise blood pressure, increase heart rate and spike blood sugar. These changes pitch your body into a fight or flight response. That enabled our ancestors to outrun saber-toothed tigers, and it's helpful today for situations like dodging a car accident. But most modern chronic stressors, such as finances or a challenging relationship, keep your body in that heightened state, which hurts your health. Effects of Too Much Stress If constantly under stress, most of [place] will eventually start to function less well.  Multiple studies link chronic stress to a higher risk of heart disease, stroke, depression, weight gain, memory loss and even premature death, so it's important to recognize the warning signals. Talk to your doctor about ways to manage stress if you're experiencing any of these  symptoms: Prolonged periods of poor sleep. Regular, severe headaches. Unexplained weight loss or gain. Feelings of isolation, withdrawal or worthlessness. Constant anger and irritability. Loss of interest in activities. Constant worrying or obsessive thinking. Excessive alcohol or drug use. Inability to concentrate.  10 Ways to Cope with Chronic Stress It's key to recognize stressful situations as they occur because it allows you to focus on managing how you react. We all need to know when to close our eyes and take a deep breath when we feel tension rising. Use these tips to prevent or reduce chronic stress. 1. Rebalance Work and Home All work and no play? If you're spending too much time at the office, intentionally put more dates in your calendar to enjoy time for fun, either alone or with others. 2. Get Regular Exercise Moving your body on a regular basis balances the nervous system and increases blood circulation, helping to flush out stress hormones. Even a daily 20-minute walk makes a difference. Any kind of exercise can lower stress and improve your mood ? just pick activities that you enjoy and make it a regular habit. 3. Eat Well and Limit Alcohol and Stimulants Alcohol, nicotine and caffeine may temporarily relieve stress but have negative health impacts and can make stress worse in the long run. Well-nourished bodies cope better, so start with a good breakfast, add more organic fruits and vegetables for a well-balanced diet, avoid processed foods and sugar, try herbal tea and drink more water. 4. Connect with Supportive People Talking face to face with another person releases hormones that reduce stress. Lean on those good listeners in your life. 5. [place] Time Do you enjoy gardening, reading, listening to music or some other creative pursuit? Engage in activities that bring you pleasure and joy; research shows that reduces stress by almost half and lowers your heart rate,  too. 6. Practice Meditation, Stress Reduction or Yoga Relaxation techniques activate a state of restfulness that counterbalances your body's fight-or-flight hormones. Even if this also means a 10-minute break in a long day: listen to music, read, go for a walk in nature, do a hobby, take a bath or spend time with a friend. Also consider doing a mindfulness exercise or try a daily deep breathing or imagery practice. Deep Breathing Slow, calm and deep breathing can help you relax. Try these steps to focus on your breathing and repeat as needed. Find a comfortable position and close your eyes. Exhale and drop your shoulders. Breathe in through your nose; fill your lungs and then your belly. Think of relaxing your body, quieting your mind and becoming calm and peaceful. Breathe out slowly through your nose, relaxing your  belly. Think of releasing tension, pain, worries or distress. Repeat steps three and four until you feel relaxed. Imagery This involves using your mind to excite the senses -- sound, vision, smell, taste and feeling. This may help ease your stress. Begin by getting comfortable and then do some slow breathing. Imagine a place you love being at. It could be somewhere from your childhood, somewhere you vacationed or just a place in your imagination. Feel how it is to be in the place you're imagining. Pay attention to the sounds, air, colors, and who is there with you. This is a place where you feel cared for and loved. All is well. You are safe. Take in all the smells, sounds, tastes and feelings. As you do, feel your body being nourished and healed. Feel the calm that surrounds you. Breathe in all the good. Breathe out any discomfort or tension. 7. Sleep Enough If you get less than seven to eight hours of sleep, your body won't tolerate stress as well as it could. If stress keeps you up at night, address the cause, and add extra meditation into your day to make up for the lost z's. Try  to get seven to nine hours of sleep each night. Make a regular bedtime schedule. Keep your room dark and cool. Try to avoid computers, TV, cell phones and tablets before bed. 8. Bond with Connections You Enjoy Go out for a coffee with a friend, chat with a neighbor, call a family member, visit with a clergy member, or even hang out with your pet. Clinical studies show that spending even a short time with a companion animal can cut anxiety levels almost in half. 9. Take a Vacation Getting away from it all can reset your stress tolerance by increasing your mental and emotional outlook, which makes you a happier, more productive person upon return. Leave your cellphone and laptop at home! 10. See a Counselor, Coach or Therapist If negative thoughts overwhelm your ability to make positive changes, it's time to seek professional help. Make an appointment today--your health and life are worth it."

## 2022-07-15 NOTE — Patient Outreach (Signed)
Medicaid Managed Care Social Work Note  07/15/2022 Name:  Jennifer [name] MRN:  [id number] DOB:  07-17-1976  Jennifer [name] is an 46 y.o. year old female who is a primary patient of Jennifer Perches, MD.  The Medicaid Managed Care Coordination team was consulted for assistance with:  Mental Health Counseling and Resources  Jennifer [name] was given information about Medicaid Managed Care Coordination team services today. Jennifer [name] Patient agreed to services and verbal consent obtained.  Engaged with patient  for by telephone forinitial visit in response to referral for case management and/or care coordination services.   Assessments/Interventions:  Review of past medical history, allergies, medications, health status, including review of consultants reports, laboratory and other test data, was performed as part of comprehensive evaluation and provision of chronic care management services.  SDOH: (Social Determinant of Health) assessments and interventions performed: SDOH Interventions    Flowsheet Row Most Recent Value  SDOH Interventions   Stress Interventions Provide Counseling, Offered [employer] Resources       Advanced Directives Status:  See Care Plan for related entries.  Care Plan                 No Known Allergies  Medications Reviewed Today     Reviewed by Jennifer Bryant, LCSW (Social Worker) on 07/15/22 at 1348  Med List Status: <None>   Medication Order Taking? Sig Documenting Provider Last Dose Status Informant  acetaminophen (TYLENOL) 500 MG tablet [id number] No Take one tablet by mouth two times daily Jennifer Perches, MD Taking Active   acyclovir (ZOVIRAX) 800 MG tablet [id number] No Take 1 tablet (800 mg total) by mouth 3 (three) times daily.  Patient not taking: Reported on 07/15/2022   Jennifer Perches, MD Not Taking Active   amLODipine (NORVASC) 5 MG tablet [id number] No TAKE 1 TABLET (5 MG TOTAL) BY MOUTH DAILY. Jennifer Perches, MD Taking  Active   busPIRone (BUSPAR) 15 MG tablet [id number] No TAKE 1 TABLET BY MOUTH THREE TIMES A DAY Jennifer Perches, MD Taking Active   clotrimazole-betamethasone (LOTRISONE) cream [id number]  APPLY TO AFFECTED AREA TWICE A DAY Jennifer Perches, MD  Active   fluticasone [facility]) 50 MCG/ACT nasal spray [id number] No Place 2 sprays into both nostrils daily. Jennifer Perches, MD Taking Active   gabapentin (NEURONTIN) 300 MG capsule [id number] No Take 1 capsule (300 mg total) by mouth at bedtime. Jennifer Perches, MD Taking Active   hydrOXYzine (ATARAX/VISTARIL) 50 MG tablet [id number] No TAKE ONE TABLET AT BEDTIME FOR SLEEP  Patient taking differently: Take 50 mg by mouth at bedtime as needed (sleep).   Jennifer Perches, MD Taking Active Self  megestrol (MEGACE) 40 MG tablet [id number] No TAKE 2 TABLETS BY MOUTH EVERY DAY Jennifer Hidalgo, DO Taking Active   meloxicam (MOBIC) 15 MG tablet [id number] No Take 1 tablet (15 mg total) by mouth daily. Jennifer Perches, MD Taking Active   oxybutynin (DITROPAN-XL) 10 MG 24 hr tablet [id number] No TAKE 1 TABLET BY MOUTH EVERYDAY AT BEDTIME Jennifer Perches, MD Taking Active   triamterene-hydrochlorothiazide [facility]) 75-50 MG tablet [id number] No TAKE 1 TABLET BY MOUTH EVERY DAY Jennifer Perches, MD Taking Active   venlafaxine XR (EFFEXOR-XR) 150 MG 24 hr capsule [id number] No TAKE 1 CAPSULE BY MOUTH DAILY WITH BREAKFAST. Jennifer Perches, MD Taking Active             Patient Active Problem List   Diagnosis  Date Noted   Mild obstructive sleep apnea 07/05/2022   Tonsillitis 07/05/2022   Trigger finger, right middle finger 07/02/2022   Colovesical fistula 12/08/2021    Class: Question of   Osteoarthritis of both knees 12/03/2021   Controlled diabetes mellitus type 2 with complications (HCC) 09/09/2021   Generalized arthritis 09/09/2021   Snoring 07/06/2021   Financial difficulties 05/09/2021   Reduced vision 04/03/2021   Alternating  constipation and diarrhea 12/16/2020   Hematochezia 09/09/2020   Right wrist pain 07/21/2020   Unsteady gait 07/14/2020   Poor urinary stream 05/27/2020   Depression, major, single episode, severe (HCC) 06/18/2019   Environmental allergies 06/18/2019   Candidiasis of skin 01/22/2019   Back pain 11/28/2017   Essential hypertension 03/22/2016   Diverticulosis 11/23/2015   Hemorrhoids 03/26/2015   Insomnia due to other mental disorder 07/10/2014   GAD (generalized anxiety disorder) 07/10/2014   FHx: cancer of digestive organ 02/16/2014   Piles (hemorrhoids) 02/14/2014   Abnormal finding on Pap smear, HPV DNA positive 11/29/2013   Allergic rhinitis 02/12/2013   Prediabetes 03/26/2012   Dyspepsia 03/22/2012   TUBERCULOSIS 11/02/2010   Alopecia (capitis) totalis 11/02/2010   Chronic fatigue 07/23/2009   Morbid obesity (HCC) 11/24/2007   Depression with anxiety 11/24/2007    Conditions to be addressed/monitored per PCP order:  Anxiety and Depression  Care Plan : LCSW Care Plan  Updates made by Jennifer Bryant, LCSW since 07/15/2022 12:00 AM     Problem: Emotional Distress      Goal: Emotional Health Supported.  Manage Depression issues. Manage Anxiety issues. Manage financial needs   Start Date: 02/22/2022  Expected End Date: 08/05/2022  Recent Progress: On track  Priority: Medium  Note:   Current Barriers:  Financial barriers Depression issues Anxiety issues Suicidal Ideation/Homicidal Ideation: No  Clinical Social Work Goal(s):  patient will work with SW  in next 30 days by telephone or in person to reduce or manage symptoms related to depression or anxiety  Client to attend scheduled medical appointments in next 30 days Client to communicate with RNCM as needed for nursing support Client will talk with SW in next 30 days about financial needs of client  Interventions: 1:1 collaboration with Jennifer Perches, MD regarding development and update of comprehensive plan  of care as evidenced by provider attestation and co-signature Discussed client needs with Jennifer [name] Provided counseling support for client Discussed her insurance coverage. She said she has [employer] but that it will end on 07/15/22. She spoke with representative at [facility] in [facility] and was told she has full Medicaid. She did not realize she had full Medicaid.   Discussed that client received call from Managed Medicaid LCSW Jennifer [name] today and that Jennifer [name] and Jennifer [name] had talked of client needs. Again, client was informed recently that she had full Medicaid. LCSW Jennifer [name] thanked Jennifer [name] for phone calls of client with LCSW Jennifer [name] as part of Care Coordination program.  LCSW Jennifer [name] informed Jennifer [name] that member of Managed Medicaid team would contact her within next 30 days to further discuss her needs at that time. Client agreed to plan. UPDATE- Patient has now been enrolled in [facility] program. She is interested in finding virtual mental health counseling and psychiatry. [facility] to [facility] for [facility] services. Email sent to patient with coping skill worksheets and resource information. Patient was encouraged to increase her self-care routine until she can get established with a long term mental health  provider. Patient agreeable. Warm transfer/hand off completed between [facility] LCSW's.   Patient Self Care Activities:  Attends medical appointments  Patient Coping Strengths:  Has emotional support from nephew, Jennifer [name]  Patient Self Care Deficits:  Financial challenges Sleep issues Depression issues Anxiety issues  Patient Goals:  - spend time or talk with others at least 2 to 3 times per week - practice relaxation or meditation daily - keep a calendar with appointment dates  10 LITTLE Things To Do When You're Feeling Too Down To Do Anything  Take a shower. Even if you plan to stay in all day long and not see a soul, take a shower. It takes the  most effort to hop in to the shower but once you do, you'll feel immediate results. It will wake you up and you'll be feeling much fresher (and cleaner too).  Brush and floss your teeth. Give your teeth a good brushing with a floss finish. It's a small task but it feels so good and you can check 'taking care of your health' off the list of things to do.  Do something small on your list. Most of [place] have some small thing we would like to get done (load of laundry, sew a button, email a friend). Doing one of these things will make you feel like you've accomplished something.  Drink water. Drinking water is easy right? It's also really beneficial for your health so keep a glass beside you all day and take sips often. It gives you energy and prevents you from boredom eating.  Do some floor exercises. The last thing you want to do is exercise but it might be just the thing you need the most. Keep it simple and do exercises that involve sitting or laying on the floor. Even the smallest of exercises release chemicals in the brain that make you feel good. Yoga stretches or core exercises are going to make you feel good with minimal effort.  Make your bed. Making your bed takes a [name] minutes but it's productive and you'll feel relieved when it's done. An unmade bed is a huge visual reminder that you're having an unproductive day. Do it and consider it your housework for the day.  Put on some nice clothes. Take the sweatpants off even if you don't plan to go anywhere. Put on clothes that make you feel good. Take a look in the mirror so your brain recognizes the sweatpants have been replaced with clothes that make you look great. It's an instant confidence booster.  Wash the dishes. A pile of dirty dishes in the sink is a reflection of your mood. It's possible that if you wash up the dishes, your mood will follow suit. It's worth a try.  Cook a real meal. If you have the luxury to have a "do nothing"  day, you have time to make a real meal for yourself. Make a meal that you love to eat. The process is good to get you out of the funk and the food will ensure you have more energy for tomorrow.  Write out your thoughts by hand. When you hand write, you stimulate your brain to focus on the moment that you're in so make yourself comfortable and write whatever comes into your mind. Put those thoughts out on paper so they stop spinning around in your head. Those thoughts might be the very thing holding you down.  The following coping skill education was provided for stress relief and mental health  management: "When your car dies or a deadline looms, how do you respond? Long-term, low-grade or acute stress takes a serious toll on your body and mind, so don't ignore feelings of constant tension. Stress is a natural part of life. However, too much stress can harm our health, especially if it continues every day. This is chronic stress and can put you at risk for heart problems like heart disease and depression. Understand what's happening inside your body and learn simple coping skills to combat the negative impacts of everyday stressors.  Types of Stress There are two types of stress: Emotional - types of emotional stress are relationship problems, pressure at work, financial worries, experiencing discrimination or having a major life change. Physical - Examples of physical stress include being sick having pain, not sleeping well, recovery from an injury or having an alcohol and drug use disorder. Fight or Flight Sudden or ongoing stress activates your nervous system and floods your bloodstream with adrenaline and cortisol, two hormones that raise blood pressure, increase heart rate and spike blood sugar. These changes pitch your body into a fight or flight response. That enabled our ancestors to outrun saber-toothed tigers, and it's helpful today for situations like dodging a car accident. But most modern  chronic stressors, such as finances or a challenging relationship, keep your body in that heightened state, which hurts your health. Effects of Too Much Stress If constantly under stress, most of [place] will eventually start to function less well.  Multiple studies link chronic stress to a higher risk of heart disease, stroke, depression, weight gain, memory loss and even premature death, so it's important to recognize the warning signals. Talk to your doctor about ways to manage stress if you're experiencing any of these symptoms: Prolonged periods of poor sleep. Regular, severe headaches. Unexplained weight loss or gain. Feelings of isolation, withdrawal or worthlessness. Constant anger and irritability. Loss of interest in activities. Constant worrying or obsessive thinking. Excessive alcohol or drug use. Inability to concentrate.  10 Ways to Cope with Chronic Stress It's key to recognize stressful situations as they occur because it allows you to focus on managing how you react. We all need to know when to close our eyes and take a deep breath when we feel tension rising. Use these tips to prevent or reduce chronic stress. 1. Rebalance Work and Home All work and no play? If you're spending too much time at the office, intentionally put more dates in your calendar to enjoy time for fun, either alone or with others. 2. Get Regular Exercise Moving your body on a regular basis balances the nervous system and increases blood circulation, helping to flush out stress hormones. Even a daily 20-minute walk makes a difference. Any kind of exercise can lower stress and improve your mood ? just pick activities that you enjoy and make it a regular habit. 3. Eat Well and Limit Alcohol and Stimulants Alcohol, nicotine and caffeine may temporarily relieve stress but have negative health impacts and can make stress worse in the long run. Well-nourished bodies cope better, so start with a good breakfast, add  more organic fruits and vegetables for a well-balanced diet, avoid processed foods and sugar, try herbal tea and drink more water. 4. Connect with Supportive People Talking face to face with another person releases hormones that reduce stress. Lean on those good listeners in your life. 5. Carve Out Hobby Time Do you enjoy gardening, reading, listening to music or some other creative pursuit? Engage  in activities that bring you pleasure and joy; research shows that reduces stress by almost half and lowers your heart rate, too. 6. Practice Meditation, Stress Reduction or Yoga Relaxation techniques activate a state of restfulness that counterbalances your body's fight-or-flight hormones. Even if this also means a 10-minute break in a long day: listen to music, read, go for a walk in nature, do a hobby, take a bath or spend time with a friend. Also consider doing a mindfulness exercise or try a daily deep breathing or imagery practice. Deep Breathing Slow, calm and deep breathing can help you relax. Try these steps to focus on your breathing and repeat as needed. Find a comfortable position and close your eyes. Exhale and drop your shoulders. Breathe in through your nose; fill your lungs and then your belly. Think of relaxing your body, quieting your mind and becoming calm and peaceful. Breathe out slowly through your nose, relaxing your belly. Think of releasing tension, pain, worries or distress. Repeat steps three and four until you feel relaxed. Imagery This involves using your mind to excite the senses -- sound, vision, smell, taste and feeling. This may help ease your stress. Begin by getting comfortable and then do some slow breathing. Imagine a place you love being at. It could be somewhere from your childhood, somewhere you vacationed or just a place in your imagination. Feel how it is to be in the place you're imagining. Pay attention to the sounds, air, colors, and who is there with you.  This is a place where you feel cared for and loved. All is well. You are safe. Take in all the smells, sounds, tastes and feelings. As you do, feel your body being nourished and healed. Feel the calm that surrounds you. Breathe in all the good. Breathe out any discomfort or tension. 7. Sleep Enough If you get less than seven to eight hours of sleep, your body won't tolerate stress as well as it could. If stress keeps you up at night, address the cause, and add extra meditation into your day to make up for the lost z's. Try to get seven to nine hours of sleep each night. Make a regular bedtime schedule. Keep your room dark and cool. Try to avoid computers, TV, cell phones and tablets before bed. 8. Bond with Connections You Enjoy Go out for a coffee with a friend, chat with a neighbor, call a family member, visit with a clergy member, or even hang out with your pet. Clinical studies show that spending even a short time with a companion animal can cut anxiety levels almost in half. 9. Take a Vacation Getting away from it all can reset your stress tolerance by increasing your mental and emotional outlook, which makes you a happier, more productive person upon return. Leave your cellphone and laptop at home! 10. See a Counselor, Coach or Therapist If negative thoughts overwhelm your ability to make positive changes, it's time to seek professional help. Make an appointment today--your health and life are worth it."          07/08/2022    4:32 PM 07/02/2022    9:49 AM 06/11/2022    1:06 PM 05/07/2022    3:40 PM 04/30/2022    9:51 AM  Depression screen PHQ 2/9  Decreased Interest 2 3 1 1  0  Down, Depressed, Hopeless 2 3 1 1  0  PHQ - 2 Score 4 6 2 2  0  Altered sleeping 2 3 1 1    Tired,  decreased energy 2 3 1 1    Change in appetite 2 3 1  0   Feeling bad or failure about yourself  2 3 1 1    Trouble concentrating 1 1 1 1    Moving slowly or fidgety/restless 1 0 1 1   Suicidal thoughts 0 1 0 0    PHQ-9 Score 14 20 8 7    Difficult doing work/chores Somewhat difficult Extremely dIfficult Somewhat difficult Very difficult     Follow up:  Patient agrees to Care Plan and Follow-up.  Plan: The Managed Medicaid care management team will reach out to the patient again over the next 30 days.  Date/time of next scheduled Social Work care management/care coordination outreach:  9/11 at 11 am.  Jennifer [name], BSW, MSW, LCSW Managed Medicaid LCSW [facility]  [facility] [email or web address] Phone: [phone]

## 2022-07-21 ENCOUNTER — Telehealth (INDEPENDENT_AMBULATORY_CARE_PROVIDER_SITE_OTHER): Payer: 59 | Admitting: Psychiatry

## 2022-07-21 ENCOUNTER — Encounter (HOSPITAL_COMMUNITY): Payer: Self-pay | Admitting: Psychiatry

## 2022-07-21 DIAGNOSIS — F4310 Post-traumatic stress disorder, unspecified: Secondary | ICD-10-CM | POA: Diagnosis not present

## 2022-07-21 DIAGNOSIS — F410 Panic disorder [episodic paroxysmal anxiety] without agoraphobia: Secondary | ICD-10-CM

## 2022-07-21 DIAGNOSIS — R5382 Chronic fatigue, unspecified: Secondary | ICD-10-CM

## 2022-07-21 DIAGNOSIS — M5440 Lumbago with sciatica, unspecified side: Secondary | ICD-10-CM

## 2022-07-21 DIAGNOSIS — F411 Generalized anxiety disorder: Secondary | ICD-10-CM

## 2022-07-21 DIAGNOSIS — F5105 Insomnia due to other mental disorder: Secondary | ICD-10-CM | POA: Diagnosis not present

## 2022-07-21 DIAGNOSIS — G8929 Other chronic pain: Secondary | ICD-10-CM

## 2022-07-21 DIAGNOSIS — F322 Major depressive disorder, single episode, severe without psychotic features: Secondary | ICD-10-CM | POA: Diagnosis not present

## 2022-07-21 DIAGNOSIS — K582 Mixed irritable bowel syndrome: Secondary | ICD-10-CM

## 2022-07-21 MED ORDER — BUSPIRONE HCL 10 MG PO TABS: ORAL_TABLET | ORAL | 0 refills | Status: DC
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
## 2022-07-21 NOTE — Addendum Note (Signed)
Addended by: Debby Bud on: 07/21/2022 10:54 AM   Modules accepted: Orders

## 2022-07-21 NOTE — Patient Instructions (Signed)
We made the following changes to your medication today: decrease buspar to '10mg'$  three times daily for 1 week, then '10mg'$  twice daily, then '5mg'$  twice daily. We will then have follow up appointment.  I placed an order for checking your vitamin b12 and iron levels as these can sometimes be low when you have chronic fatigue/low energy. Come by the clinic in [place] to have this checked.

## 2022-07-22 ENCOUNTER — Other Ambulatory Visit: Payer: Self-pay | Admitting: Family Medicine

## 2022-07-22 ENCOUNTER — Other Ambulatory Visit: Payer: Self-pay | Admitting: Pharmacist

## 2022-07-22 DIAGNOSIS — E118 Type 2 diabetes mellitus with unspecified complications: Secondary | ICD-10-CM

## 2022-07-22 NOTE — Progress Notes (Signed)
Care Coordination Call  Called patient to follow up on medication access for GLP1s. She has not had a chance to check with her new [employer] plan on what coverage criteria is for [place], or Wegovy or Ozempic.   She will reach out and let me know what she finds out.   [name], PharmD, [facility] [phone]

## 2022-07-26 ENCOUNTER — Ambulatory Visit: Payer: 59 | Admitting: Dietician

## 2022-07-26 ENCOUNTER — Other Ambulatory Visit: Payer: Self-pay | Admitting: Licensed Clinical Social Worker

## 2022-07-26 NOTE — Patient Outreach (Cosign Needed Addendum)
Medicaid Managed Care Social Work Note  07/26/2022 Name:  Jennifer [name] MRN:  [id number] DOB:  1976-05-27  Jennifer [name] is an 46 y.o. year old female who is a primary patient of [name], MD.  The Medicaid Managed Care Coordination team was consulted for assistance with:  Mental Health Counseling and Resources  Jennifer [name] was given information about Medicaid Managed Care Coordination team services today. Jennifer [name] Patient agreed to services and verbal consent obtained.  Engaged with patient  for by telephone forfollow up visit in response to referral for case management and/or care coordination services.   Assessments/Interventions:  Review of past medical history, allergies, medications, health status, including review of consultants reports, laboratory and other test data, was performed as part of comprehensive evaluation and provision of chronic care management services.  SDOH: (Social Determinant of Health) assessments and interventions performed: SDOH Interventions    Flowsheet Row Video Visit [facility] 07/21[facility]2022 [facility] Patient Outreach Telephone from 07/15/2022 in [facility] Care Coordination Chronic Care Management from 07/08/2022 in [facility] Chronic Care Management from 06/11/2022 in [facility] Chronic Care Management from 05/07/2022 in [facility] Chronic Care Management from 02/22/2022 in [facility]  SDOH Interventions        Depression Interventions/Treatment  Medication, Currently on Treatment -- Counseling Counseling, Medication Counseling, Medication --  Financial Strain Interventions -- -- -- -- -- Other (Comment)  [client is applying for Disability]  Physical Activity Interventions -- -- -- Other (Comments)  [client has difficulty walking sometimes. Client has difficulty standing sometimes] Other (Comments)  [walking challenges. She uses a cane  to help her walk] Other (Comments)  [walking challenges. she uses a cane to help her walk]  Stress Interventions -- Provide Counseling, Offered [employer] Resources Provide Counseling  [client has stress related to managing medical needs] Provide Counseling  [client has stress related to difficulty sleeping. client has stress related to financial issues.] Provide Counseling  [client has stress related to financial issues] Provide Counseling  [client has stress related to financial challenges. client has stress in managing medical needs]       Advanced Directives Status:  See Care Plan for related entries.  Care Plan                 No Known Allergies  Medications Reviewed Today     Reviewed by Elsie Lincoln, MD (Psychiatrist) on 07/21/22 at 1051  Med List Status: <None>   Medication Order Taking? Sig Documenting Provider Last Dose Status Informant  Discontinued 07/21/22 0851 (No longer needed (for PRN medications))   acyclovir (ZOVIRAX) 800 MG tablet [id number] No Take 1 tablet (800 mg total) by mouth 3 (three) times daily.  Patient not taking: Reported on 07/15/2022   [name], MD Not Taking Active   amLODipine (NORVASC) 5 MG tablet [id number] No TAKE 1 TABLET (5 MG TOTAL) BY MOUTH DAILY. [name], MD Taking Active   busPIRone (BUSPAR) 10 MG tablet [id number] Yes Take 10mg  three times daily for 1 week. Then take 10mg  twice daily for one week. Then take 5mg  twice daily for one week. Elsie Lincoln, MD  Active   Discontinued 07/21/22 [phone] (Dose change)   clotrimazole-betamethasone (LOTRISONE) cream [id number]  APPLY TO AFFECTED AREA TWICE A DAY [name], MD  Active   fluticasone [facility]) 50 MCG/ACT nasal spray [id number] No Place 2 sprays into both nostrils daily. Syliva Overman  E, MD Taking Active   gabapentin (NEURONTIN) 300 MG capsule [id number] No Take 1 capsule (300 mg total) by mouth at bedtime. [name], MD Taking Active    Patient taking differently:  Discontinued 07/21/22 0852 (No longer needed (for PRN medications)) megestrol (MEGACE) 40 MG tablet [id number] No TAKE 2 TABLETS BY MOUTH EVERY DAY Myna Hidalgo, DO Taking Active   meloxicam (MOBIC) 15 MG tablet [id number] No Take 1 tablet (15 mg total) by mouth daily. [name], MD Taking Active   oxybutynin (DITROPAN-XL) 10 MG 24 hr tablet [id number] No TAKE 1 TABLET BY MOUTH EVERYDAY AT BEDTIME [name], MD Taking Active   triamterene-hydrochlorothiazide [facility]) 75-50 MG tablet [id number] No TAKE 1 TABLET BY MOUTH EVERY DAY [name], MD Taking Active   venlafaxine XR (EFFEXOR-XR) 150 MG 24 hr capsule [id number] No TAKE 1 CAPSULE BY MOUTH DAILY WITH BREAKFAST. [name], MD Taking Active             Patient Active Problem List   Diagnosis Date Noted   Irritable bowel syndrome with both constipation and diarrhea 07/21/2022   Mild obstructive sleep apnea 07/05/2022   Tonsillitis 07/05/2022   Trigger finger, right middle finger 07/02/2022   Colovesical fistula 12/08/2021    Class: Question of   Osteoarthritis of both knees 12/03/2021   Controlled diabetes mellitus type 2 with complications (HCC) 09/09/2021   Generalized arthritis 09/09/2021   Snoring 07/06/2021   Financial difficulties 05/09/2021   Reduced vision 04/03/2021   Hematochezia 09/09/2020   Right wrist pain 07/21/2020   Unsteady gait 07/14/2020   Poor urinary stream 05/27/2020   Depression, major, single episode, severe (HCC) 06/18/2019   Environmental allergies 06/18/2019   Candidiasis of skin 01/22/2019   Back pain 11/28/2017   Essential hypertension 03/22/2016   Diverticulosis 11/23/2015   Hemorrhoids 03/26/2015   Insomnia due to other mental disorder 07/10/2014   Generalized anxiety disorder with panic attacks 07/10/2014   FHx: cancer of digestive organ 02/16/2014   Piles (hemorrhoids) 02/14/2014   Abnormal finding on Pap smear, HPV DNA  positive 11/29/2013   Allergic rhinitis 02/12/2013   Prediabetes 03/26/2012   Dyspepsia 03/22/2012   TUBERCULOSIS 11/02/2010   Alopecia (capitis) totalis 11/02/2010   Chronic fatigue 07/23/2009   Morbid obesity (HCC) 11/24/2007    Conditions to be addressed/monitored per PCP order:  Anxiety and Depression  Care Plan : LCSW Care Plan  Updates made by Gustavus Bryant, LCSW since 07/26/2022 12:00 AM     Problem: Emotional Distress      Goal: Emotional Health Supported.  Manage Depression issues. Manage Anxiety issues. Manage financial needs   Start Date: 02/22/2022  Expected End Date: 08/05/2022  Recent Progress: On track  Priority: Medium  Note:   Current Barriers:  Financial barriers Depression issues Anxiety issues Suicidal Ideation/Homicidal Ideation: No  Clinical Social Work Goal(s):  patient will work with SW  in next 30 days by telephone or in person to reduce or manage symptoms related to depression or anxiety  Client to attend scheduled medical appointments in next 30 days Client will talk with [facility] BSW in next 30 days about financial needs of client  Interventions: 1:1 collaboration with [name], MD regarding development and update of comprehensive plan of care as evidenced by provider attestation and co-signature Discussed client needs with Ireana Hogans Provided counseling support for client Discussed her insurance coverage. She said she has [employer] but that it will end on  07/15/22. She spoke with representative at DSS in [facility] and was told she has full Medicaid. She did not realize she had full Medicaid.   Discussed that client received call from Managed Medicaid LCSW Dickie La today and that Shamera and Nehemiah Settle had talked of client needs. Again, client was informed recently that she had full Medicaid. LCSW Lorna Few thanked Malgorzata for phone calls of client with LCSW Lorna Few as part of Care Coordination program.  LCSW Lorna Few  informed Jennifer [name] that member of Managed Medicaid team would contact her within next 30 days to further discuss her needs at that time. Client agreed to plan. UPDATE- Patient has now been enrolled in [facility] program. She is interested in finding virtual mental health counseling and psychiatry. [facility] to [facility] for [facility] services. Email sent to patient with coping skill worksheets and resource information. Patient was encouraged to increase her self-care routine until she can get established with a long term mental health provider. Patient agreeable. Warm transfer/hand off completed between [facility] LCSW's. UPDATE- Patient has started psychiatry services at [facility] in [place], [place]. She reports that her psychiatrist is going to refer her to a therapist there (which she prefers.) [facility] LCSW sent in basket message to her psychiatrist regarding this request as [facility] LCSW is happy to help patient get connected with a long term therapist. Patient received a call from [facility] but declined their referral because she had just completed her new patient psychiatry appointment. If she is unable to gain a counselor at the [facility] in [place] then she will consider other resources. [facility] LCSW placed referral for [facility] as well today and she will have an appointment tomorrow to address her financial needs which is a major stressor for her at this time.  Patient Self Care Activities:  Attends medical appointments  Patient Coping Strengths:  Has emotional support from nephew, Kaelynne Void  Patient Self Care Deficits:  Financial challenges Sleep issues Depression issues Anxiety issues  Patient Goals:  - spend time or talk with others at least 2 to 3 times per week - practice relaxation or meditation daily - keep a calendar with appointment dates  10 LITTLE Things To Do When You're Feeling Too Down To Do  Anything  Take a shower. Even if you plan to stay in all day long and not see a soul, take a shower. It takes the most effort to hop in to the shower but once you do, you'll feel immediate results. It will wake you up and you'll be feeling much fresher (and cleaner too).  Brush and floss your teeth. Give your teeth a good brushing with a floss finish. It's a small task but it feels so good and you can check 'taking care of your health' off the list of things to do.  Do something small on your list. Most of [place] have some small thing we would like to get done (load of laundry, sew a button, email a friend). Doing one of these things will make you feel like you've accomplished something.  Drink water. Drinking water is easy right? It's also really beneficial for your health so keep a glass beside you all day and take sips often. It gives you energy and prevents you from boredom eating.  Do some floor exercises. The last thing you want to do is exercise but it might be just the thing you need the most.  Keep it simple and do exercises that involve sitting or laying on the floor. Even the smallest of exercises release chemicals in the brain that make you feel good. Yoga stretches or core exercises are going to make you feel good with minimal effort.  Make your bed. Making your bed takes a few minutes but it's productive and you'll feel relieved when it's done. An unmade bed is a huge visual reminder that you're having an unproductive day. Do it and consider it your housework for the day.  Put on some nice clothes. Take the sweatpants off even if you don't plan to go anywhere. Put on clothes that make you feel good. Take a look in the mirror so your brain recognizes the sweatpants have been replaced with clothes that make you look great. It's an instant confidence booster.  Wash the dishes. A pile of dirty dishes in the sink is a reflection of your mood. It's possible that if you wash up the dishes,  your mood will follow suit. It's worth a try.  Cook a real meal. If you have the luxury to have a "do nothing" day, you have time to make a real meal for yourself. Make a meal that you love to eat. The process is good to get you out of the funk and the food will ensure you have more energy for tomorrow.  Write out your thoughts by hand. When you hand write, you stimulate your brain to focus on the moment that you're in so make yourself comfortable and write whatever comes into your mind. Put those thoughts out on paper so they stop spinning around in your head. Those thoughts might be the very thing holding you down.  The following coping skill education was provided for stress relief and mental health management: "When your car dies or a deadline looms, how do you respond? Long-term, low-grade or acute stress takes a serious toll on your body and mind, so don't ignore feelings of constant tension. Stress is a natural part of life. However, too much stress can harm our health, especially if it continues every day. This is chronic stress and can put you at risk for heart problems like heart disease and depression. Understand what's happening inside your body and learn simple coping skills to combat the negative impacts of everyday stressors.  Types of Stress There are two types of stress: Emotional - types of emotional stress are relationship problems, pressure at work, financial worries, experiencing discrimination or having a major life change. Physical - Examples of physical stress include being sick having pain, not sleeping well, recovery from an injury or having an alcohol and drug use disorder. Fight or Flight Sudden or ongoing stress activates your nervous system and floods your bloodstream with adrenaline and cortisol, two hormones that raise blood pressure, increase heart rate and spike blood sugar. These changes pitch your body into a fight or flight response. That enabled our ancestors to  outrun saber-toothed tigers, and it's helpful today for situations like dodging a car accident. But most modern chronic stressors, such as finances or a challenging relationship, keep your body in that heightened state, which hurts your health. Effects of Too Much Stress If constantly under stress, most of [place] will eventually start to function less well.  Multiple studies link chronic stress to a higher risk of heart disease, stroke, depression, weight gain, memory loss and even premature death, so it's important to recognize the warning signals. Talk to your doctor about ways to  manage stress if you're experiencing any of these symptoms: Prolonged periods of poor sleep. Regular, severe headaches. Unexplained weight loss or gain. Feelings of isolation, withdrawal or worthlessness. Constant anger and irritability. Loss of interest in activities. Constant worrying or obsessive thinking. Excessive alcohol or drug use. Inability to concentrate.  10 Ways to Cope with Chronic Stress It's key to recognize stressful situations as they occur because it allows you to focus on managing how you react. We all need to know when to close our eyes and take a deep breath when we feel tension rising. Use these tips to prevent or reduce chronic stress. 1. Rebalance Work and Home All work and no play? If you're spending too much time at the office, intentionally put more dates in your calendar to enjoy time for fun, either alone or with others. 2. Get Regular Exercise Moving your body on a regular basis balances the nervous system and increases blood circulation, helping to flush out stress hormones. Even a daily 20-minute walk makes a difference. Any kind of exercise can lower stress and improve your mood ? just pick activities that you enjoy and make it a regular habit. 3. Eat Well and Limit Alcohol and Stimulants Alcohol, nicotine and caffeine may temporarily relieve stress but have negative health impacts and  can make stress worse in the long run. Well-nourished bodies cope better, so start with a good breakfast, add more organic fruits and vegetables for a well-balanced diet, avoid processed foods and sugar, try herbal tea and drink more water. 4. Connect with Supportive People Talking face to face with another person releases hormones that reduce stress. Lean on those good listeners in your life. 5. Carve Out Hobby Time Do you enjoy gardening, reading, listening to music or some other creative pursuit? Engage in activities that bring you pleasure and joy; research shows that reduces stress by almost half and lowers your heart rate, too. 6. Practice Meditation, Stress Reduction or Yoga Relaxation techniques activate a state of restfulness that counterbalances your body's fight-or-flight hormones. Even if this also means a 10-minute break in a long day: listen to music, read, go for a walk in nature, do a hobby, take a bath or spend time with a friend. Also consider doing a mindfulness exercise or try a daily deep breathing or imagery practice. Deep Breathing Slow, calm and deep breathing can help you relax. Try these steps to focus on your breathing and repeat as needed. Find a comfortable position and close your eyes. Exhale and drop your shoulders. Breathe in through your nose; fill your lungs and then your belly. Think of relaxing your body, quieting your mind and becoming calm and peaceful. Breathe out slowly through your nose, relaxing your belly. Think of releasing tension, pain, worries or distress. Repeat steps three and four until you feel relaxed. Imagery This involves using your mind to excite the senses -- sound, vision, smell, taste and feeling. This may help ease your stress. Begin by getting comfortable and then do some slow breathing. Imagine a place you love being at. It could be somewhere from your childhood, somewhere you vacationed or just a place in your imagination. Feel how it is  to be in the place you're imagining. Pay attention to the sounds, air, colors, and who is there with you. This is a place where you feel cared for and loved. All is well. You are safe. Take in all the smells, sounds, tastes and feelings. As you do, feel your body being  nourished and healed. Feel the calm that surrounds you. Breathe in all the good. Breathe out any discomfort or tension. 7. Sleep Enough If you get less than seven to eight hours of sleep, your body won't tolerate stress as well as it could. If stress keeps you up at night, address the cause, and add extra meditation into your day to make up for the lost z's. Try to get seven to nine hours of sleep each night. Make a regular bedtime schedule. Keep your room dark and cool. Try to avoid computers, TV, cell phones and tablets before bed. 8. Bond with Connections You Enjoy Go out for a coffee with a friend, chat with a neighbor, call a family member, visit with a clergy member, or even hang out with your pet. Clinical studies show that spending even a short time with a companion animal can cut anxiety levels almost in half. 9. Take a Vacation Getting away from it all can reset your stress tolerance by increasing your mental and emotional outlook, which makes you a happier, more productive person upon return. Leave your cellphone and laptop at home! 10. See a Counselor, Coach or Therapist If negative thoughts overwhelm your ability to make positive changes, it's time to seek professional help. Make an appointment today--your health and life are worth it."        Follow up:  Patient agrees to Care Plan and Follow-up.  Plan: The Managed Medicaid care management team will reach out to the patient again over the next 30 days.  Date/time of next scheduled Social Work care management/care coordination outreach:  08/12/22 at 11 am  Dickie La, BSW, MSW, [employer] Managed Medicaid LCSW [facility]  [facility] [employer] [email or web address] Phone: [phone]

## 2022-07-26 NOTE — Patient Instructions (Signed)
Visit Information  Jennifer [name] was given information about Medicaid Managed Care team care coordination services as a part of their Healthy [facility] Medicaid benefit. Jennifer [name] verbally consented to engagement with the [facility] Managed Care team.   If you are experiencing a medical emergency, please call 911 or report to your local emergency department or urgent care.   If you have a non-emergency medical problem during routine business hours, please contact your provider's office and ask to speak with a nurse.   For questions related to your Healthy [facility] health plan, please call: [phone] or visit the homepage here: [email or web address]  If you would like to schedule transportation through your Healthy [facility] plan, please call the following number at least 2 days in advance of your appointment: [phone]  For information about your ride after you set it up, call Ride Assist at [phone]. Use this number to activate a Will Call pickup, or if your transportation is late for a scheduled pickup. Use this number, too, if you need to make a change or cancel a previously scheduled reservation.  If you need transportation services right away, call [phone]. The after-hours call center is staffed 24 hours to handle ride assistance and urgent reservation requests (including discharges) 365 days a year. Urgent trips include sick visits, hospital discharge requests and life-sustaining treatment.  Call the [place] at [phone], at any time, 24 hours a day, 7 days a week. If you are in danger or need immediate medical attention call 911.  If you would like help to quit smoking, call 1-800-QUIT-NOW 346-803-8315) OR Espaol: 1-855-Djelo-Ya (7-322-025-4270) o para ms informacin haga clic aqu or Text READY to 200-400 to register via text  Following is a copy of your plan of care:  Care Plan : [place]   Updates made by Greg Cutter, LCSW since 07/26/2022 12:00 AM     Problem: Emotional Distress      Goal: Emotional Health Supported.  Manage Depression issues. Manage Anxiety issues. Manage financial needs   Start Date: 02/22/2022  Expected End Date: 08/05/2022  Recent Progress: On track  Priority: Medium  Note:   Current Barriers:  Financial barriers Depression issues Anxiety issues Suicidal Ideation/Homicidal Ideation: No  Clinical Social Work Goal(s):  patient will work with SW  in next 30 days by telephone or in person to reduce or manage symptoms related to depression or anxiety  Client to attend scheduled medical appointments in next 30 days Client will talk with [facility] in next 30 days about financial needs of client  Patient Self Care Activities:  Attends medical appointments  Patient Coping Strengths:  Has emotional support from nephew, Day Greb  Patient Self Care Deficits:  Financial challenges Sleep issues Depression issues Anxiety issues  Patient Goals:  - spend time or talk with others at least 2 to 3 times per week - practice relaxation or meditation daily - keep a calendar with appointment dates  10 LITTLE Things To Do When You're Feeling Too Down To Do Anything  Take a shower. Even if you plan to stay in all day long and not see a soul, take a shower. It takes the most effort to hop in to the shower but once you do, you'll feel immediate results. It will wake you up and you'll be feeling much fresher (and cleaner too).  Brush and floss your teeth. Give your teeth a good brushing with a floss finish. It's a small task but it  feels so good and you can check 'taking care of your health' off the list of things to do.  Do something small on your list. Most of [place] have some small thing we would like to get done (load of laundry, sew a button, email a friend). Doing one of these things will make you feel like you've accomplished something.  Drink  water. Drinking water is easy right? It's also really beneficial for your health so keep a glass beside you all day and take sips often. It gives you energy and prevents you from boredom eating.  Do some floor exercises. The last thing you want to do is exercise but it might be just the thing you need the most. Keep it simple and do exercises that involve sitting or laying on the floor. Even the smallest of exercises release chemicals in the brain that make you feel good. Yoga stretches or core exercises are going to make you feel good with minimal effort.  Make your bed. Making your bed takes a few minutes but it's productive and you'll feel relieved when it's done. An unmade bed is a huge visual reminder that you're having an unproductive day. Do it and consider it your housework for the day.  Put on some nice clothes. Take the sweatpants off even if you don't plan to go anywhere. Put on clothes that make you feel good. Take a look in the mirror so your brain recognizes the sweatpants have been replaced with clothes that make you look great. It's an instant confidence booster.  Wash the dishes. A pile of dirty dishes in the sink is a reflection of your mood. It's possible that if you wash up the dishes, your mood will follow suit. It's worth a try.  Cook a real meal. If you have the luxury to have a "do nothing" day, you have time to make a real meal for yourself. Make a meal that you love to eat. The process is good to get you out of the funk and the food will ensure you have more energy for tomorrow.  Write out your thoughts by hand. When you hand write, you stimulate your brain to focus on the moment that you're in so make yourself comfortable and write whatever comes into your mind. Put those thoughts out on paper so they stop spinning around in your head. Those thoughts might be the very thing holding you down.  The following coping skill education was provided for stress relief and  mental health management: "When your car dies or a deadline looms, how do you respond? Long-term, low-grade or acute stress takes a serious toll on your body and mind, so don't ignore feelings of constant tension. Stress is a natural part of life. However, too much stress can harm our health, especially if it continues every day. This is chronic stress and can put you at risk for heart problems like heart disease and depression. Understand what's happening inside your body and learn simple coping skills to combat the negative impacts of everyday stressors.  Types of Stress There are two types of stress: Emotional - types of emotional stress are relationship problems, pressure at work, financial worries, experiencing discrimination or having a major life change. Physical - Examples of physical stress include being sick having pain, not sleeping well, recovery from an injury or having an alcohol and drug use disorder. Fight or Flight Sudden or ongoing stress activates your nervous system and floods your bloodstream with adrenaline and cortisol,  two hormones that raise blood pressure, increase heart rate and spike blood sugar. These changes pitch your body into a fight or flight response. That enabled our ancestors to outrun saber-toothed tigers, and it's helpful today for situations like dodging a car accident. But most modern chronic stressors, such as finances or a challenging relationship, keep your body in that heightened state, which hurts your health. Effects of Too Much Stress If constantly under stress, most of [place] will eventually start to function less well.  Multiple studies link chronic stress to a higher risk of heart disease, stroke, depression, weight gain, memory loss and even premature death, so it's important to recognize the warning signals. Talk to your doctor about ways to manage stress if you're experiencing any of these symptoms: Prolonged periods of poor sleep. Regular, severe  headaches. Unexplained weight loss or gain. Feelings of isolation, withdrawal or worthlessness. Constant anger and irritability. Loss of interest in activities. Constant worrying or obsessive thinking. Excessive alcohol or drug use. Inability to concentrate.  10 Ways to Cope with Chronic Stress It's key to recognize stressful situations as they occur because it allows you to focus on managing how you react. We all need to know when to close our eyes and take a deep breath when we feel tension rising. Use these tips to prevent or reduce chronic stress. 1. Rebalance Work and Home All work and no play? If you're spending too much time at the office, intentionally put more dates in your calendar to enjoy time for fun, either alone or with others. 2. Get Regular Exercise Moving your body on a regular basis balances the nervous system and increases blood circulation, helping to flush out stress hormones. Even a daily 20-minute walk makes a difference. Any kind of exercise can lower stress and improve your mood ? just pick activities that you enjoy and make it a regular habit. 3. Eat Well and Limit Alcohol and Stimulants Alcohol, nicotine and caffeine may temporarily relieve stress but have negative health impacts and can make stress worse in the long run. Well-nourished bodies cope better, so start with a good breakfast, add more organic fruits and vegetables for a well-balanced diet, avoid processed foods and sugar, try herbal tea and drink more water. 4. Connect with Supportive People Talking face to face with another person releases hormones that reduce stress. Lean on those good listeners in your life. 5. [place] Time Do you enjoy gardening, reading, listening to music or some other creative pursuit? Engage in activities that bring you pleasure and joy; research shows that reduces stress by almost half and lowers your heart rate, too. 6. Practice Meditation, Stress Reduction or  Yoga Relaxation techniques activate a state of restfulness that counterbalances your body's fight-or-flight hormones. Even if this also means a 10-minute break in a long day: listen to music, read, go for a walk in nature, do a hobby, take a bath or spend time with a friend. Also consider doing a mindfulness exercise or try a daily deep breathing or imagery practice. Deep Breathing Slow, calm and deep breathing can help you relax. Try these steps to focus on your breathing and repeat as needed. Find a comfortable position and close your eyes. Exhale and drop your shoulders. Breathe in through your nose; fill your lungs and then your belly. Think of relaxing your body, quieting your mind and becoming calm and peaceful. Breathe out slowly through your nose, relaxing your belly. Think of releasing tension, pain, worries or distress.  Repeat steps three and four until you feel relaxed. Imagery This involves using your mind to excite the senses -- sound, vision, smell, taste and feeling. This may help ease your stress. Begin by getting comfortable and then do some slow breathing. Imagine a place you love being at. It could be somewhere from your childhood, somewhere you vacationed or just a place in your imagination. Feel how it is to be in the place you're imagining. Pay attention to the sounds, air, colors, and who is there with you. This is a place where you feel cared for and loved. All is well. You are safe. Take in all the smells, sounds, tastes and feelings. As you do, feel your body being nourished and healed. Feel the calm that surrounds you. Breathe in all the good. Breathe out any discomfort or tension. 7. Sleep Enough If you get less than seven to eight hours of sleep, your body won't tolerate stress as well as it could. If stress keeps you up at night, address the cause, and add extra meditation into your day to make up for the lost z's. Try to get seven to nine hours of sleep each night.  Make a regular bedtime schedule. Keep your room dark and cool. Try to avoid computers, TV, cell phones and tablets before bed. 8. Bond with Connections You Enjoy Go out for a coffee with a friend, chat with a neighbor, call a family member, visit with a clergy member, or even hang out with your pet. Clinical studies show that spending even a short time with a companion animal can cut anxiety levels almost in half. 9. Take a Vacation Getting away from it all can reset your stress tolerance by increasing your mental and emotional outlook, which makes you a happier, more productive person upon return. Leave your cellphone and laptop at home! 10. See a Counselor, Coach or Therapist If negative thoughts overwhelm your ability to make positive changes, it's time to seek professional help. Make an appointment today--your health and life are worth it."

## 2022-07-27 ENCOUNTER — Ambulatory Visit: Payer: 59

## 2022-07-27 ENCOUNTER — Other Ambulatory Visit: Payer: Self-pay

## 2022-07-27 NOTE — Patient Instructions (Signed)
Visit Information  Ms. Shalika MERINDA VICTORINO  - as a part of your Medicaid benefit, you are eligible for care management and care coordination services at no cost or copay. I was unable to reach you by phone today but would be happy to help you with your health related needs. Please feel free to call me @ [phone].   A member of the Managed Medicaid care management team will reach out to you again over the next 7 days.   Mickel Fuchs, BSW, [place] Managed Medicaid Team  [phone]

## 2022-07-27 NOTE — Patient Outreach (Signed)
Care Coordination  07/27/2022  [name] 16-Sep-1976 [id number]   Medicaid Managed Care   Unsuccessful Outreach Note  07/27/2022 Name: Jennifer [name] MRN: [id number] DOB: [birth year]  Referred by: [name], MD Reason for referral : High Risk Managed Medicaid (MM Social work Unsuccessful Telephone outreach )   An unsuccessful telephone outreach was attempted today. The patient was referred to the case management team for assistance with care management and care coordination.   Follow Up Plan: The care management team will reach out to the patient again over the next 7 days.  Mickel Fuchs, BSW, [place] Managed Medicaid Team  [phone]

## 2022-08-02 ENCOUNTER — Encounter: Payer: 59 | Attending: General Surgery | Admitting: Dietician

## 2022-08-02 ENCOUNTER — Encounter: Payer: Self-pay | Admitting: Dietician

## 2022-08-02 DIAGNOSIS — E669 Obesity, unspecified: Secondary | ICD-10-CM | POA: Insufficient documentation

## 2022-08-02 NOTE — Progress Notes (Signed)
Supervised Weight Loss Visit Bariatric Nutrition Education  Planned surgery: RYGB Pt expectation of surgery: to be comfortable  3 out of 6 SWL Appointments   NUTRITION ASSESSMENT   Anthropometrics  Start weight at [facility]: 310.2 lbs (date: 04/30/2022)  Height: 67 in Weight: 312.6 lbs. BMI: 48.96 kg/m2    Clinical  Medical hx: Anxiety, arthritis, depression, GERD, HTN Medications: Megestrol, Mounjaro, buspirone oxybutynin, triamterene, venlafaxine XR  Labs: 12/03/2021: A1C 6.2 Notable signs/symptoms: none noted Any previous deficiencies? No  Lifestyle & Dietary Hx  Pt arrived using a cane, moving slowly.  Pt states her main pain comes from her knees, but sometimes her lower back. Pt states she does not drink the orangeade now, stating only un-sweet tea. Pt states she is drinking 4 water bottles a day. Pt states she may skip meals when money is tight, stating food stamps should be approved this week. Pt states she started seeing a psychiatrist in [place].   Estimated daily fluid intake: 64+ oz Supplements:  Current average weekly physical activity: pool therapy, two days a week.  24-Hr Dietary Recall First Meal: sausage biscuit or wheat toast with a fruit. Snack: banana or orange Second Meal: sub sandwich from [employer] or make a sandwich at home. Snack: bag of chips Third Meal: chicken pot pie or grilled chicken, broccoli, rice Snack: fruit like [place] or watermelon or crackers Beverages: water, un-sweet tea  Estimated Energy Needs 1500   NUTRITION DIAGNOSIS  Overweight/obesity ([place]-3.3) related to past poor dietary habits and physical inactivity as evidenced by patient w/ planned RYGB surgery following dietary guidelines for continued weight loss.   NUTRITION INTERVENTION  Nutrition counseling (C-1) and education (E-2) to facilitate bariatric surgery goals.  Pre-Op Goals Progress & New Goals Continue: reduce sugary drinks and soda Continue: water  therapy Continue: eat throughout the day; meal prep the day before to help reduce skipping meals. New: Focus on whole grains  Handouts Provided Include    Learning Style & Readiness for Change Teaching method utilized: Visual & Auditory  Demonstrated degree of understanding via: Teach Back  Readiness Level: contemplative Barriers to learning/adherence to lifestyle change: previous habits  RD's Notes for next Visit  Pt progress toward chosen goals   MONITORING & EVALUATION Dietary intake, weekly physical activity, body weight, and pre-op goals in 1 month.   Next Steps  Patient is to return to [facility] in 1 month for next SWL visit.

## 2022-08-04 ENCOUNTER — Other Ambulatory Visit (HOSPITAL_COMMUNITY): Payer: Self-pay | Admitting: Psychiatry

## 2022-08-04 DIAGNOSIS — F410 Panic disorder [episodic paroxysmal anxiety] without agoraphobia: Secondary | ICD-10-CM

## 2022-08-05 ENCOUNTER — Encounter (HOSPITAL_COMMUNITY): Payer: Self-pay

## 2022-08-05 ENCOUNTER — Other Ambulatory Visit: Payer: Self-pay

## 2022-08-05 NOTE — Patient Outreach (Signed)
Medicaid Managed Care Social Work Note  08/05/2022 Name:  Jennifer [name] MRN:  [id number] DOB:  02-03-1976  Jennifer [name] is an 46 y.o. year old female who is a primary patient of [name], MD.  The Medicaid Managed Care Coordination team was consulted for assistance with:  Community Resources   Ms. Bosier was given information about Medicaid Managed Care Coordination team services today. Dayana Deirdre Priest Patient agreed to services and verbal consent obtained.  Engaged with patient  for by telephone forinitial visit in response to referral for case management and/or care coordination services.   Assessments/Interventions:  Review of past medical history, allergies, medications, health status, including review of consultants reports, laboratory and other test data, was performed as part of comprehensive evaluation and provision of chronic care management services.  SDOH: (Social Determinant of Health) assessments and interventions performed: SDOH Interventions    Flowsheet Row Patient Outreach Telephone from 07/15/2022 in [facility] Care Coordination Chronic Care Management from 07/08/2022 in [facility] Chronic Care Management from 06/11/2022 in [facility] Chronic Care Management from 05/07/2022 in [facility] Chronic Care Management from 02/22/2022 in [facility] Chronic Care Management from 12/25/2021 in [facility]  SDOH Interventions        Depression Interventions/Treatment  -- Counseling Counseling, Medication Counseling, Medication -- Counseling  Financial Strain Interventions -- -- -- -- Other (Comment)  [client is applying for Disability] Other (Comment)  [financial stress,  she has no income at present]  Physical Activity Interventions -- -- Other (Comments)  [client has difficulty walking sometimes. Client has difficulty standing sometimes] Other (Comments)  [walking challenges. She uses a cane  to help her walk] Other (Comments)  [walking challenges. she uses a cane to help her walk] --  Stress Interventions Provide Counseling, Offered [employer] Resources Provide Counseling  [client has stress related to managing medical needs] Provide Counseling  [client has stress related to difficulty sleeping. client has stress related to financial issues.] Provide Counseling  [client has stress related to financial issues] Provide Counseling  [client has stress related to financial challenges. client has stress in managing medical needs] Provide Counseling  [client has stress related to financial needs. client has stress related to housing needs]     BSW completed a telephone outreach with patient. She stated she does not have any income and is behind on her utilities and rent. A company in [place] did assistance her with some of it. BSW will email patient resources to [email or web address]. [place] does receive foodstamps.   Advanced Directives Status:  Not addressed in this encounter.  Care Plan                 No Known Allergies  Medications Reviewed Today     Reviewed by Beulah Gandy, RD (Registered Dietitian) on 08/02/22 at 1452  Med List Status: <None>   Medication Order Taking? Sig Documenting Provider Last Dose Status Informant  acyclovir (ZOVIRAX) 800 MG tablet [id number] No Take 1 tablet (800 mg total) by mouth 3 (three) times daily.  Patient not taking: Reported on 07/15/2022   [name], MD Not Taking Active   amLODipine (NORVASC) 5 MG tablet [id number] No TAKE 1 TABLET (5 MG TOTAL) BY MOUTH DAILY. [name], MD Taking Active   busPIRone (BUSPAR) 10 MG tablet [id number]  Take 10mg  three times daily for 1 week. Then take 10mg  twice daily for one week. Then take 5mg  twice daily for  one week. Elsie Lincoln, MD  Active   clotrimazole-betamethasone [facility]) cream [id number]  APPLY TO AFFECTED AREA TWICE A DAY [name], MD  Active    fluticasone [facility]) 50 MCG/ACT nasal spray [id number] No Place 2 sprays into both nostrils daily. [name], MD Taking Active   gabapentin (NEURONTIN) 300 MG capsule [id number] No Take 1 capsule (300 mg total) by mouth at bedtime. [name], MD Taking Active   megestrol (MEGACE) 40 MG tablet [id number] No TAKE 2 TABLETS BY MOUTH EVERY DAY Myna Hidalgo, DO Taking Active   meloxicam (MOBIC) 15 MG tablet [id number] No Take 1 tablet (15 mg total) by mouth daily. [name], MD Taking Active   oxybutynin (DITROPAN-XL) 10 MG 24 hr tablet [id number] No TAKE 1 TABLET BY MOUTH EVERYDAY AT BEDTIME [name], MD Taking Active   triamterene-hydrochlorothiazide [facility]) 75-50 MG tablet [id number] No TAKE 1 TABLET BY MOUTH EVERY DAY [name], MD Taking Active   venlafaxine XR (EFFEXOR-XR) 150 MG 24 hr capsule [id number]  TAKE 1 CAPSULE BY MOUTH DAILY WITH BREAKFAST. [name], MD  Active             Patient Active Problem List   Diagnosis Date Noted   Irritable bowel syndrome with both constipation and diarrhea 07/21/2022   Mild obstructive sleep apnea 07/05/2022   Tonsillitis 07/05/2022   Trigger finger, right middle finger 07/02/2022   Colovesical fistula 12/08/2021    Class: Question of   Osteoarthritis of both knees 12/03/2021   Controlled diabetes mellitus type 2 with complications (HCC) 09/09/2021   Generalized arthritis 09/09/2021   Snoring 07/06/2021   Financial difficulties 05/09/2021   Reduced vision 04/03/2021   Hematochezia 09/09/2020   Right wrist pain 07/21/2020   Unsteady gait 07/14/2020   Poor urinary stream 05/27/2020   Depression, major, single episode, severe (HCC) 06/18/2019   Environmental allergies 06/18/2019   Candidiasis of skin 01/22/2019   Back pain 11/28/2017   Essential hypertension 03/22/2016   Diverticulosis 11/23/2015   Hemorrhoids 03/26/2015   Insomnia due to other mental disorder 07/10/2014    Generalized anxiety disorder with panic attacks 07/10/2014   FHx: cancer of digestive organ 02/16/2014   Piles (hemorrhoids) 02/14/2014   Abnormal finding on Pap smear, HPV DNA positive 11/29/2013   Allergic rhinitis 02/12/2013   Prediabetes 03/26/2012   Dyspepsia 03/22/2012   TUBERCULOSIS 11/02/2010   Alopecia (capitis) totalis 11/02/2010   Chronic fatigue 07/23/2009   Morbid obesity (HCC) 11/24/2007    Conditions to be addressed/monitored per PCP order:   community resources  Care Plan : LCSW Plan of Care  Updates made by Shaune Leeks since 08/05/2022 12:00 AM     Problem: Financial strains due to pain and inabilty to work exacerbating depression and anxiety   Priority: High  Note:   BSW completed a telephone outreach with patient. She stated she does not have any income and is behind on her utilities and rent. A company in [place] did assistance her with some of it. BSW will email patient resources to [email or web address]. [place] does receive foodstamps.      Follow up:  Patient agrees to Care Plan and Follow-up.  Plan: The Managed Medicaid care management team will reach out to the patient again over the next 30 days.  Date/time of next scheduled Social Work care management/care coordination outreach:  09/03/22  Gus Puma, BSW, MHA [facility]  [place]  High Risk Managed Medicaid Team  [phone]

## 2022-08-05 NOTE — Patient Instructions (Signed)
Visit Information  Ms. Crow was given information about Medicaid Managed Care team care coordination services as a part of their Healthy [facility] Medicaid benefit. Diasia MARCENE LASKOWSKI verbally consented to engagement with the [facility] Managed Care team.   If you are experiencing a medical emergency, please call 911 or report to your local emergency department or urgent care.   If you have a non-emergency medical problem during routine business hours, please contact your provider's office and ask to speak with a nurse.   For questions related to your Healthy [facility] health plan, please call: [phone] or visit the homepage here: [email or web address]  If you would like to schedule transportation through your Healthy [facility] plan, please call the following number at least 2 days in advance of your appointment: [phone]  For information about your ride after you set it up, call Ride Assist at [phone]. Use this number to activate a Will Call pickup, or if your transportation is late for a scheduled pickup. Use this number, too, if you need to make a change or cancel a previously scheduled reservation.  If you need transportation services right away, call [phone]. The after-hours call center is staffed 24 hours to handle ride assistance and urgent reservation requests (including discharges) 365 days a year. Urgent trips include sick visits, hospital discharge requests and life-sustaining treatment.  Call the [facility] at [phone], at any time, 24 hours a day, 7 days a week. If you are in danger or need immediate medical attention call 911.  If you would like help to quit smoking, call 1-800-QUIT-NOW (781)549-5588) OR Espaol: 1-855-Djelo-Ya (4-496-759-1638) o para ms informacin haga clic aqu or Text READY to 200-400 to register via text  Ms. Jezek - following are the goals we discussed in your visit today:    Goals Addressed   None       Social Worker will follow up in 30 days .   Mickel Fuchs, BSW, [place]  High Risk Managed Medicaid Team  [phone]   Following is a copy of your plan of care:  Care Plan : LCSW Plan of Care  Updates made by Ethelda Chick since 08/05/2022 12:00 AM     Problem: Financial strains due to pain and inabilty to work exacerbating depression and anxiety   Priority: High  Note:   BSW completed a telephone outreach with patient. She stated she does not have any income and is behind on her utilities and rent. A company in [place] did assistance her with some of it. BSW will email patient resources to [email or web address]. [place] does receive foodstamps.

## 2022-08-11 ENCOUNTER — Telehealth (INDEPENDENT_AMBULATORY_CARE_PROVIDER_SITE_OTHER): Payer: 59 | Admitting: Psychiatry

## 2022-08-11 ENCOUNTER — Encounter (HOSPITAL_COMMUNITY): Payer: Self-pay | Admitting: Psychiatry

## 2022-08-11 ENCOUNTER — Encounter: Payer: Self-pay | Admitting: Internal Medicine

## 2022-08-11 ENCOUNTER — Ambulatory Visit: Payer: 59 | Attending: Internal Medicine | Admitting: Internal Medicine

## 2022-08-11 VITALS — BP 128/88 | HR 106 | Ht 67.0 in | Wt 318.8 lb

## 2022-08-11 DIAGNOSIS — F5105 Insomnia due to other mental disorder: Secondary | ICD-10-CM | POA: Diagnosis not present

## 2022-08-11 DIAGNOSIS — F99 Mental disorder, not otherwise specified: Secondary | ICD-10-CM

## 2022-08-11 DIAGNOSIS — Z01810 Encounter for preprocedural cardiovascular examination: Secondary | ICD-10-CM

## 2022-08-11 DIAGNOSIS — F5081 Binge eating disorder: Secondary | ICD-10-CM | POA: Diagnosis not present

## 2022-08-11 DIAGNOSIS — G8929 Other chronic pain: Secondary | ICD-10-CM

## 2022-08-11 DIAGNOSIS — F410 Panic disorder [episodic paroxysmal anxiety] without agoraphobia: Secondary | ICD-10-CM | POA: Diagnosis not present

## 2022-08-11 DIAGNOSIS — F331 Major depressive disorder, recurrent, moderate: Secondary | ICD-10-CM | POA: Insufficient documentation

## 2022-08-11 DIAGNOSIS — M170 Bilateral primary osteoarthritis of knee: Secondary | ICD-10-CM | POA: Diagnosis not present

## 2022-08-11 DIAGNOSIS — F322 Major depressive disorder, single episode, severe without psychotic features: Secondary | ICD-10-CM | POA: Insufficient documentation

## 2022-08-11 DIAGNOSIS — M5440 Lumbago with sciatica, unspecified side: Secondary | ICD-10-CM

## 2022-08-11 DIAGNOSIS — F411 Generalized anxiety disorder: Secondary | ICD-10-CM | POA: Diagnosis not present

## 2022-08-11 DIAGNOSIS — F332 Major depressive disorder, recurrent severe without psychotic features: Secondary | ICD-10-CM | POA: Diagnosis not present

## 2022-08-11 DIAGNOSIS — R69 Illness, unspecified: Secondary | ICD-10-CM | POA: Diagnosis not present

## 2022-08-11 DIAGNOSIS — F4310 Post-traumatic stress disorder, unspecified: Secondary | ICD-10-CM | POA: Insufficient documentation

## 2022-08-11 DIAGNOSIS — G4733 Obstructive sleep apnea (adult) (pediatric): Secondary | ICD-10-CM

## 2022-08-11 DIAGNOSIS — F50814 Binge eating disorder, in remission: Secondary | ICD-10-CM | POA: Insufficient documentation

## 2022-08-11 MED ORDER — VENLAFAXINE HCL ER 75 MG PO CP24: ORAL_CAPSULE | ORAL | 0 refills | Status: DC

## 2022-08-11 MED ORDER — FLUOXETINE HCL 20 MG PO CAPS: 20.0000 mg | ORAL_CAPSULE | Freq: Every day | ORAL | 0 refills | Status: DC

## 2022-08-11 NOTE — Progress Notes (Signed)
Cardiology Office Note:    Date:  08/11/2022   ID:  MOSELLA Jennifer [name], DOB November 10, 1976, MRN [id number]  PCP:  [name], MD   [facility] Providers Cardiologist:  Maisie Fus, MD     Referring MD: [name], MD   No chief complaint on file. Pre-op evaluation  History of Present Illness:    Jennifer [name] is a 46 y.o. female with a hx of GERD, severe obesity BMI 50, referral for pre-op for sleeve gastrectomy  She has trouble ambulating. She walked up a flight of stairs without chest pressure. She is deconditioned and she can get winded. She has prediabetes A1c 6.2%. She's done a sleep study and noted she was diagnosed with sleep apnea and planning to receive a cpap machine.  She has no family hx of premature CAD. Her sister had an MI at 110 years of age, deceased.  No smoking cigarettes  No prior cardiology visits. No hx of stress test.  Has HTN well managed  EKG 01/22/2022- sinus tachycardia 102 bpm  Interim 08/11/2022 She did not have her surgery yet.  She notes she was on Glp1 agonist and her insurance wouldn't pay for it.   Past Medical History:  Diagnosis Date   Abnormal vaginal Pap smear    Acid reflux    Alopecia    Anxiety    Arthritis    Depression    Diabetes mellitus without complication (HCC)    prediabetic, takes no meds   Diverticulosis    Family history of diabetes mellitus    GERD (gastroesophageal reflux disease)    Hemorrhoids    History of recurrent UTIs    Hypertension    Obesity     Past Surgical History:  Procedure Laterality Date   APPENDECTOMY     COLONOSCOPY N/A 03/13/2014   Dr. Jena Gauss- grade 3 hemorrhoids, colonic diverticulosis bx= benign lymphoid polyp   COLONOSCOPY WITH PROPOFOL N/A 01/26/2021   Procedure: COLONOSCOPY WITH PROPOFOL;  Surgeon: Corbin Ade, MD;  Location: [facility];  Service: Endoscopy;  Laterality: N/A;  AM   KNEE ARTHROSCOPY     right   POLYPECTOMY  01/26/2021   Procedure: POLYPECTOMY;   Surgeon: Corbin Ade, MD;  Location: [facility];  Service: Endoscopy;;    Current Medications: Current Outpatient Medications on File Prior to Visit  Medication Sig Dispense Refill   acyclovir (ZOVIRAX) 800 MG tablet Take 1 tablet (800 mg total) by mouth 3 (three) times daily. (Patient not taking: Reported on 07/15/2022) 30 tablet 1   amLODipine (NORVASC) 5 MG tablet TAKE 1 TABLET (5 MG TOTAL) BY MOUTH DAILY. 90 tablet 1   clotrimazole-betamethasone (LOTRISONE) cream APPLY TO AFFECTED AREA TWICE A DAY 45 g 1   fluticasone (FLONASE) 50 MCG/ACT nasal spray Place 2 sprays into both nostrils daily. 16 g 6   gabapentin (NEURONTIN) 300 MG capsule Take 1 capsule (300 mg total) by mouth at bedtime. 30 capsule 3   megestrol (MEGACE) 40 MG tablet TAKE 2 TABLETS BY MOUTH EVERY DAY 60 tablet 3   meloxicam (MOBIC) 15 MG tablet Take 1 tablet (15 mg total) by mouth daily. 30 tablet 2   oxybutynin (DITROPAN-XL) 10 MG 24 hr tablet TAKE 1 TABLET BY MOUTH EVERYDAY AT BEDTIME 90 tablet 1   triamterene-hydrochlorothiazide (MAXZIDE) 75-50 MG tablet TAKE 1 TABLET BY MOUTH EVERY DAY 90 tablet 3   No current facility-administered medications on file prior to visit.     Allergies:  Patient has no known allergies.   Social History   Socioeconomic History   Marital status: Single    Spouse name: Not on file   Number of children: 4   Years of education: Not on file   Highest education level: Some college, no degree  Occupational History   Occupation: UNEMPLOYWED SINCE 10/2010  Tobacco Use   Smoking status: Never   Smokeless tobacco: Never  Vaping Use   Vaping Use: Never used  Substance and Sexual Activity   Alcohol use: No   Drug use: Not Currently   Sexual activity: Not Currently    Birth control/protection: Pill  Other Topics Concern   Not on file  Social History Narrative   Not on file   Social Determinants of Health   Financial Resource Strain: High Risk (07/15/2022)   Overall  Financial Resource Strain (CARDIA)    Difficulty of Paying Living Expenses: Hard  Food Insecurity: Food Insecurity Present (08/02/2022)   Hunger Vital Sign    Worried About Running Out of Food in the Last Year: Often true    Ran Out of Food in the Last Year: Often true  Transportation Needs: No Transportation Needs (06/08/2021)   PRAPARE - Administrator, Civil Service (Medical): No    Lack of Transportation (Non-Medical): No  Recent Concern: Transportation Needs - Unmet Transportation Needs (04/06/2021)   PRAPARE - Transportation    Lack of Transportation (Medical): Yes    Lack of Transportation (Non-Medical): Yes  Physical Activity: Insufficiently Active (06/11/2022)   Exercise Vital Sign    Days of Exercise per Week: 2 days    Minutes of Exercise per Session: 10 min  Stress: Stress Concern Present (07/15/2022)   [employer] of Occupational Health - Occupational Stress Questionnaire    Feeling of Stress : Rather much  Social Connections: Moderately Isolated (06/08/2021)   Social Connection and Isolation Panel [NHANES]    Frequency of Communication with Friends and Family: More than three times a week    Frequency of Social Gatherings with Friends and Family: Three times a week    Attends Religious Services: 1 to 4 times per year    Active Member of Clubs or Organizations: No    Attends Banker Meetings: Never    Marital Status: Never married     Family History: The patient's family history includes Breast cancer in her maternal aunt; Diabetes in her mother; Hypertension in her mother; Other in her mother. There is no history of Colon cancer, Tuberous sclerosis, or Alpha-1 antitrypsin deficiency. She was adopted.  ROS:   Please see the history of present illness.     All other systems reviewed and are negative.  EKGs/Labs/Other Studies Reviewed:    The following studies were reviewed today:   EKG:  EKG is  ordered today.  The ekg ordered today  demonstrates   EKG- 02/08/2022:Sinus tachycardia, low voltage/septal q likely 2/2 habitus  Recent Labs: 12/03/2021: Hemoglobin 13.4; Platelets 385; TSH 2.740 01/28/2022: BUN 16; Creat 1.37; Potassium 4.1; Sodium 141  Recent Lipid Panel    Component Value Date/Time   CHOL 152 05/06/2021 1059   TRIG 79 05/06/2021 1059   HDL 49 05/06/2021 1059   CHOLHDL 3.1 05/06/2021 1059   CHOLHDL 2.3 05/29/2020 1045   VLDL 9 12/08/2016 1401   LDLCALC 88 05/06/2021 1059   LDLCALC 62 05/29/2020 1045    The 10-year ASCVD risk score (Arnett DK, et al., 2019) is: 6.9%   Values used to  calculate the score:     Age: 29 years     Sex: Female     Is Non-Hispanic African American: Yes     Diabetic: Yes     Tobacco smoker: No     Systolic Blood Pressure: 130 mmHg     Is BP treated: Yes     HDL Cholesterol: 49 mg/dL     Total Cholesterol: 152 mg/dL    Risk Assessment/Calculations:           Physical Exam:    VS:    There were no vitals filed for this visit.    Wt Readings from Last 3 Encounters:  08/02/22 (!) 312 lb 9.6 oz (141.8 kg)  07/05/22 (!) 311 lb 3.2 oz (141.2 kg)  07/02/22 (!) 311 lb (141.1 kg)     GEN:  Well nourished, well developed in no acute distress HEENT: Normal NECK: No JVD; No carotid bruits LYMPHATICS: No lymphadenopathy CARDIAC: RRR, no murmurs, rubs, gallops RESPIRATORY:  Clear to auscultation without rales, wheezing or rhonchi  ABDOMEN: Soft, non-tender, non-distended MUSCULOSKELETAL:  No edema; No deformity  SKIN: Warm and dry NEUROLOGIC:  Alert and oriented x 3 PSYCHIATRIC:  Normal affect   ASSESSMENT:    Cardiac Preoperative Risk Assessment:  She can do > 4 METS without chest pressure. She is deconditioned 2/2 weight.  RCRI 3.9% for an adverse 30 day cardiac event. She does not have high CVD risk. She does not require further ischemic risk assessment at this time prior to her procedure. Will recommend continued antihypertensives, her blood pressure is at  goal < 130/80 mmHg.   Recommend to continue with lifestyle modification and CVD risk mitigation (yearly A1c, lipid monitoring). No changes in her plan.    PLAN:    In order of problems listed above:  Acceptable cardiac risk for surgery No changes FU PRN         Medication Adjustments/Labs and Tests Ordered: Current medicines are reviewed at length with the patient today.  Concerns regarding medicines are outlined above.  No orders of the defined types were placed in this encounter.  No orders of the defined types were placed in this encounter.   There are no Patient Instructions on file for this visit.   Signed, Maisie Fus, MD  08/11/2022 11:13 AM    [facility]

## 2022-08-11 NOTE — Patient Instructions (Signed)
Medication Instructions:  Your physician recommends that you continue on your current medications as directed. Please refer to the Current Medication list given to you today.  *If you need a refill on your cardiac medications before your next appointment, please call your pharmacy*   Lab Work: None   Testing/Procedures: None   Follow-Up: At [facility], you and your health needs are our priority.  As part of our continuing mission to provide you with exceptional heart care, we have created designated Provider Care Teams.  These Care Teams include your primary Cardiologist (physician) and Advanced Practice Providers (APPs -  Physician Assistants and Nurse Practitioners) who all work together to provide you with the care you need, when you need it.  We recommend signing up for the patient portal called "MyChart".  Sign up information is provided on this After Visit Summary.  MyChart is used to connect with patients for Virtual Visits (Telemedicine).  Patients are able to view lab/test results, encounter notes, upcoming appointments, etc.  Non-urgent messages can be sent to your provider as well.   To learn more about what you can do with MyChart, go to [email or web address].    Your next appointment:   As needed  The format for your next appointment:   In Person  Provider:   Branch, Mary E, MD     Other Instructions   Important Information About Sugar       

## 2022-08-11 NOTE — Patient Instructions (Signed)
We discontinued your buspar today. We also changed your venlafaxine dose to '75mg'$  and a new prescription was called in; you will take this once daily for one week then stop. Once off the venlafaxine you can start the fluoxetine (prozac) once daily and we will have a follow up in 2 weeks to reassess. If you can, swing by the [place] clinic to get your blood work drawn so we can make sure we aren't missing other causes of your fatigue. If you can, check with your insurer or sleep doctor in order to see if your CPAP would be covered now.
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
## 2022-08-12 ENCOUNTER — Other Ambulatory Visit: Payer: Self-pay | Admitting: Licensed Clinical Social Worker

## 2022-08-12 NOTE — Patient Instructions (Signed)
Visit Information  Ms. Rainford was given information about Medicaid Managed Care team care coordination services as a part of their Healthy [facility] Medicaid benefit. Jaymes LAYLANA GERWIG verbally consented to engagement with the [facility] Managed Care team.   If you are experiencing a medical emergency, please call 911 or report to your local emergency department or urgent care.   If you have a non-emergency medical problem during routine business hours, please contact your provider's office and ask to speak with a nurse.   For questions related to your Healthy [facility] health plan, please call: [phone] or visit the homepage here: [email or web address]  If you would like to schedule transportation through your Healthy [facility] plan, please call the following number at least 2 days in advance of your appointment: [phone]  For information about your ride after you set it up, call Ride Assist at [phone]. Use this number to activate a Will Call pickup, or if your transportation is late for a scheduled pickup. Use this number, too, if you need to make a change or cancel a previously scheduled reservation.  If you need transportation services right away, call [phone]. The after-hours call center is staffed 24 hours to handle ride assistance and urgent reservation requests (including discharges) 365 days a year. Urgent trips include sick visits, hospital discharge requests and life-sustaining treatment.  Call the [place] at [phone], at any time, 24 hours a day, 7 days a week. If you are in danger or need immediate medical attention call 911.  If you would like help to quit smoking, call 1-800-QUIT-NOW 3430796405) OR Espaol: 1-855-Djelo-Ya (1-093-235-5732) o para ms informacin haga clic aqu or Text READY to 200-400 to register via text   Following is a copy of your plan of care:  Care Plan : Van   Updates made by Greg Cutter, LCSW since 08/12/2022 12:00 AM     Problem: Emotional Distress      Goal: Emotional Health Supported.  Manage Depression issues. Manage Anxiety issues. Manage financial needs   Start Date: 02/22/2022  Expected End Date: 08/05/2022  Recent Progress: On track  Priority: Medium  Note:   Current Barriers:  Financial barriers Depression issues Anxiety issues Suicidal Ideation/Homicidal Ideation: No  Clinical Social Work Goal(s):  patient will work with SW  in next 30 days by telephone or in person to reduce or manage symptoms related to depression or anxiety  Client to attend scheduled medical appointments in next 30 days Client will talk with [facility] in next 30 days about financial needs of client  Patient Self Care Activities:  Attends medical appointments  Patient Coping Strengths:  Has emotional support from nephew, Chalet Kerwin  Patient Self Care Deficits:  Financial challenges Sleep issues Depression issues Anxiety issues  Patient Goals:  - spend time or talk with others at least 2 to 3 times per week - practice relaxation or meditation daily - keep a calendar with appointment dates  10 LITTLE Things To Do When You're Feeling Too Down To Do Anything  Take a shower. Even if you plan to stay in all day long and not see a soul, take a shower. It takes the most effort to hop in to the shower but once you do, you'll feel immediate results. It will wake you up and you'll be feeling much fresher (and cleaner too).  Brush and floss your teeth. Give your teeth a good brushing with a floss finish. It's a small task but  it feels so good and you can check 'taking care of your health' off the list of things to do.  Do something small on your list. Most of [place] have some small thing we would like to get done (load of laundry, sew a button, email a friend). Doing one of these things will make you feel like you've accomplished something.  Drink  water. Drinking water is easy right? It's also really beneficial for your health so keep a glass beside you all day and take sips often. It gives you energy and prevents you from boredom eating.  Do some floor exercises. The last thing you want to do is exercise but it might be just the thing you need the most. Keep it simple and do exercises that involve sitting or laying on the floor. Even the smallest of exercises release chemicals in the brain that make you feel good. Yoga stretches or core exercises are going to make you feel good with minimal effort.  Make your bed. Making your bed takes a few minutes but it's productive and you'll feel relieved when it's done. An unmade bed is a huge visual reminder that you're having an unproductive day. Do it and consider it your housework for the day.  Put on some nice clothes. Take the sweatpants off even if you don't plan to go anywhere. Put on clothes that make you feel good. Take a look in the mirror so your brain recognizes the sweatpants have been replaced with clothes that make you look great. It's an instant confidence booster.  Wash the dishes. A pile of dirty dishes in the sink is a reflection of your mood. It's possible that if you wash up the dishes, your mood will follow suit. It's worth a try.  Cook a real meal. If you have the luxury to have a "do nothing" day, you have time to make a real meal for yourself. Make a meal that you love to eat. The process is good to get you out of the funk and the food will ensure you have more energy for tomorrow.  Write out your thoughts by hand. When you hand write, you stimulate your brain to focus on the moment that you're in so make yourself comfortable and write whatever comes into your mind. Put those thoughts out on paper so they stop spinning around in your head. Those thoughts might be the very thing holding you down.  The following coping skill education was provided for stress relief and  mental health management: "When your car dies or a deadline looms, how do you respond? Long-term, low-grade or acute stress takes a serious toll on your body and mind, so don't ignore feelings of constant tension. Stress is a natural part of life. However, too much stress can harm our health, especially if it continues every day. This is chronic stress and can put you at risk for heart problems like heart disease and depression. Understand what's happening inside your body and learn simple coping skills to combat the negative impacts of everyday stressors.  Types of Stress There are two types of stress: Emotional - types of emotional stress are relationship problems, pressure at work, financial worries, experiencing discrimination or having a major life change. Physical - Examples of physical stress include being sick having pain, not sleeping well, recovery from an injury or having an alcohol and drug use disorder. Fight or Flight Sudden or ongoing stress activates your nervous system and floods your bloodstream with adrenaline and  cortisol, two hormones that raise blood pressure, increase heart rate and spike blood sugar. These changes pitch your body into a fight or flight response. That enabled our ancestors to outrun saber-toothed tigers, and it's helpful today for situations like dodging a car accident. But most modern chronic stressors, such as finances or a challenging relationship, keep your body in that heightened state, which hurts your health. Effects of Too Much Stress If constantly under stress, most of [place] will eventually start to function less well.  Multiple studies link chronic stress to a higher risk of heart disease, stroke, depression, weight gain, memory loss and even premature death, so it's important to recognize the warning signals. Talk to your doctor about ways to manage stress if you're experiencing any of these symptoms: Prolonged periods of poor sleep. Regular, severe  headaches. Unexplained weight loss or gain. Feelings of isolation, withdrawal or worthlessness. Constant anger and irritability. Loss of interest in activities. Constant worrying or obsessive thinking. Excessive alcohol or drug use. Inability to concentrate.  10 Ways to Cope with Chronic Stress It's key to recognize stressful situations as they occur because it allows you to focus on managing how you react. We all need to know when to close our eyes and take a deep breath when we feel tension rising. Use these tips to prevent or reduce chronic stress. 1. Rebalance Work and Home All work and no play? If you're spending too much time at the office, intentionally put more dates in your calendar to enjoy time for fun, either alone or with others. 2. Get Regular Exercise Moving your body on a regular basis balances the nervous system and increases blood circulation, helping to flush out stress hormones. Even a daily 20-minute walk makes a difference. Any kind of exercise can lower stress and improve your mood ? just pick activities that you enjoy and make it a regular habit. 3. Eat Well and Limit Alcohol and Stimulants Alcohol, nicotine and caffeine may temporarily relieve stress but have negative health impacts and can make stress worse in the long run. Well-nourished bodies cope better, so start with a good breakfast, add more organic fruits and vegetables for a well-balanced diet, avoid processed foods and sugar, try herbal tea and drink more water. 4. Connect with Supportive People Talking face to face with another person releases hormones that reduce stress. Lean on those good listeners in your life. 5. [place] Time Do you enjoy gardening, reading, listening to music or some other creative pursuit? Engage in activities that bring you pleasure and joy; research shows that reduces stress by almost half and lowers your heart rate, too. 6. Practice Meditation, Stress Reduction or  Yoga Relaxation techniques activate a state of restfulness that counterbalances your body's fight-or-flight hormones. Even if this also means a 10-minute break in a long day: listen to music, read, go for a walk in nature, do a hobby, take a bath or spend time with a friend. Also consider doing a mindfulness exercise or try a daily deep breathing or imagery practice. Deep Breathing Slow, calm and deep breathing can help you relax. Try these steps to focus on your breathing and repeat as needed. Find a comfortable position and close your eyes. Exhale and drop your shoulders. Breathe in through your nose; fill your lungs and then your belly. Think of relaxing your body, quieting your mind and becoming calm and peaceful. Breathe out slowly through your nose, relaxing your belly. Think of releasing tension, pain, worries or  distress. Repeat steps three and four until you feel relaxed. Imagery This involves using your mind to excite the senses -- sound, vision, smell, taste and feeling. This may help ease your stress. Begin by getting comfortable and then do some slow breathing. Imagine a place you love being at. It could be somewhere from your childhood, somewhere you vacationed or just a place in your imagination. Feel how it is to be in the place you're imagining. Pay attention to the sounds, air, colors, and who is there with you. This is a place where you feel cared for and loved. All is well. You are safe. Take in all the smells, sounds, tastes and feelings. As you do, feel your body being nourished and healed. Feel the calm that surrounds you. Breathe in all the good. Breathe out any discomfort or tension. 7. Sleep Enough If you get less than seven to eight hours of sleep, your body won't tolerate stress as well as it could. If stress keeps you up at night, address the cause, and add extra meditation into your day to make up for the lost z's. Try to get seven to nine hours of sleep each night.  Make a regular bedtime schedule. Keep your room dark and cool. Try to avoid computers, TV, cell phones and tablets before bed. 8. Bond with Connections You Enjoy Go out for a coffee with a friend, chat with a neighbor, call a family member, visit with a clergy member, or even hang out with your pet. Clinical studies show that spending even a short time with a companion animal can cut anxiety levels almost in half. 9. Take a Vacation Getting away from it all can reset your stress tolerance by increasing your mental and emotional outlook, which makes you a happier, more productive person upon return. Leave your cellphone and laptop at home! 10. See a Counselor, Coach or Therapist If negative thoughts overwhelm your ability to make positive changes, it's time to seek professional help. Make an appointment today--your health and life are worth it."  If you are experiencing a Mental Health or [place] or need someone to talk to, please call the Suicide and Crisis Lifeline: 988    Follow up goal

## 2022-08-12 NOTE — Patient Outreach (Signed)
Medicaid Managed Care Social Work Note  08/12/2022 Name:  Jennifer [name] MRN:  [id number] DOB:  May 19, 1976  Jennifer [name] is an 46 y.o. year old female who is a primary patient of [name], MD.  The Medicaid Managed Care Coordination team was consulted for assistance with:  Mental Health Counseling and Resources  Jennifer [name] was given information about Medicaid Managed Care Coordination team services today. Jennifer [name] Patient agreed to services and verbal consent obtained.  Engaged with patient  for by telephone forfollow up visit in response to referral for case management and/or care coordination services.   Assessments/Interventions:  Review of past medical history, allergies, medications, health status, including review of consultants reports, laboratory and other test data, was performed as part of comprehensive evaluation and provision of chronic care management services.  SDOH: (Social Determinant of Health) assessments and interventions performed: SDOH Interventions    Flowsheet Row Patient Outreach Telephone from 08/12/2022 in [facility] Coordination Video Visit from 07/21/2022 [facility] Patient Outreach Telephone from 07/15/2022 in [facility] Care Coordination Chronic Care Management from 07/08/2022 in [facility] Chronic Care Management from 06/11/2022 in [facility] Chronic Care Management from 05/07/2022 in [facility]  SDOH Interventions        Depression Interventions/Treatment  -- Medication, Currently on Treatment -- Counseling Counseling, Medication Counseling, Medication  Physical Activity Interventions -- -- -- -- Other (Comments)  [client has difficulty walking sometimes. Client has difficulty standing sometimes] Other (Comments)  [walking challenges. She uses a cane to help her walk]  Stress Interventions Provide Counseling,  Offered [employer] -- Provide Counseling, Offered [employer] Resources Provide Counseling  [client has stress related to managing medical needs] Provide Counseling  [client has stress related to difficulty sleeping. client has stress related to financial issues.] Provide Counseling  [client has stress related to financial issues]       Advanced Directives Status:  See Care Plan for related entries.  Care Plan                 No Known Allergies  Medications Reviewed Today     Reviewed by Gustavus Bryant, Jennifer (Social Worker) on 08/12/22 at 1127  Med List Status: <None>   Medication Order Taking? Sig Documenting Provider Last Dose Status Informant  amLODipine (NORVASC) 5 MG tablet [id number] No TAKE 1 TABLET (5 MG TOTAL) BY MOUTH DAILY. [name], MD Taking Active   clotrimazole-betamethasone Thurmond Butts) cream [id number] No APPLY TO AFFECTED AREA TWICE A DAY [name], MD Taking Active   FLUoxetine (PROZAC) 20 MG capsule [id number] No Take 1 capsule (20 mg total) by mouth daily. After finishing venlafaxine. Elsie Lincoln, MD Taking Active   fluticasone [facility]) 50 MCG/ACT nasal spray [id number] No Place 2 sprays into both nostrils daily. [name], MD Taking Active   gabapentin (NEURONTIN) 300 MG capsule [id number] No Take 1 capsule (300 mg total) by mouth at bedtime. [name], MD Taking Active   INCASSIA 0.35 MG tablet [id number] No Take 1 tablet by mouth daily. [provider] Taking Active   megestrol (MEGACE) 40 MG tablet [id number] No TAKE 2 TABLETS BY MOUTH EVERY DAY Myna Hidalgo, DO Taking Active   meloxicam (MOBIC) 15 MG tablet [id number] No Take 1 tablet (15 mg total) by mouth daily. [name], MD Taking Active   oxybutynin (DITROPAN-XL) 10 MG 24 hr tablet [id number]  No TAKE 1 TABLET BY MOUTH EVERYDAY AT BEDTIME [name], MD Taking Active   triamterene-hydrochlorothiazide [facility]) 75-50 MG  tablet [id number] No TAKE 1 TABLET BY MOUTH EVERY DAY [name], MD Taking Active   venlafaxine XR (EFFEXOR XR) 75 MG 24 hr capsule [id number] No Take one pill once daily for one week then stop. Elsie Lincoln, MD Taking Active             Patient Active Problem List   Diagnosis Date Noted   Severe episode of recurrent major depressive disorder, without psychotic features (HCC) 08/11/2022   PTSD (post-traumatic stress disorder) 08/11/2022   Binge eating disorder 08/11/2022   Irritable bowel syndrome with both constipation and diarrhea 07/21/2022   Mild obstructive sleep apnea 07/05/2022   Tonsillitis 07/05/2022   Trigger finger, right middle finger 07/02/2022   Colovesical fistula 12/08/2021    Class: Question of   Osteoarthritis of both knees 12/03/2021   Controlled diabetes mellitus type 2 with complications (HCC) 09/09/2021   Generalized arthritis 09/09/2021   Snoring 07/06/2021   Financial difficulties 05/09/2021   Reduced vision 04/03/2021   Hematochezia 09/09/2020   Right wrist pain 07/21/2020   Unsteady gait 07/14/2020   Poor urinary stream 05/27/2020   Depression, major, single episode, severe (HCC) 06/18/2019   Environmental allergies 06/18/2019   Candidiasis of skin 01/22/2019   Back pain 11/28/2017   Essential hypertension 03/22/2016   Diverticulosis 11/23/2015   Hemorrhoids 03/26/2015   Insomnia due to other mental disorder 07/10/2014   Generalized anxiety disorder with panic attacks 07/10/2014   FHx: cancer of digestive organ 02/16/2014   Piles (hemorrhoids) 02/14/2014   Abnormal finding on Pap smear, HPV DNA positive 11/29/2013   Allergic rhinitis 02/12/2013   Prediabetes 03/26/2012   Dyspepsia 03/22/2012   TUBERCULOSIS 11/02/2010   Alopecia (capitis) totalis 11/02/2010   Chronic fatigue 07/23/2009   Morbid obesity (HCC) 11/24/2007    Conditions to be addressed/monitored per PCP order:  Anxiety and Depression  Care Plan : Jennifer Care Plan   Updates made by Gustavus Bryant, Jennifer since 08/12/2022 12:00 AM     Problem: Emotional Distress      Goal: Emotional Health Supported.  Manage Depression issues. Manage Anxiety issues. Manage financial needs   Start Date: 02/22/2022  Expected End Date: 08/05/2022  Recent Progress: On track  Priority: Medium  Note:   Current Barriers:  Financial barriers Depression issues Anxiety issues Suicidal Ideation/Homicidal Ideation: No  Clinical Social Work Goal(s):  patient will work with SW  in next 30 days by telephone or in person to reduce or manage symptoms related to depression or anxiety  Client to attend scheduled medical appointments in next 30 days Client will talk with [facility] BSW in next 30 days about financial needs of client  Interventions: 1:1 collaboration with [name], MD regarding development and update of comprehensive plan of care as evidenced by provider attestation and co-signature Discussed client needs with Jennifer [name] Provided counseling support for client Discussed her insurance coverage. She said she has [employer] but that it will end on 07/15/22. She spoke with representative at DSS in [facility] and was told she has full Medicaid. She did not realize she had full Medicaid.   Discussed that client received call from Managed Medicaid Jennifer Jennifer [name] today and that Jennifer [name] and Jennifer [name] had talked of client needs. Again, client was informed recently that she had full Medicaid. Jennifer [name] thanked Jennifer [name] for phone calls  of client with Jennifer [name] as part of Care Coordination program.  Jennifer [name] informed Jennifer [name] that member of Managed Medicaid team would contact her within next 30 days to further discuss her needs at that time. Client agreed to plan. UPDATE- Patient has now been enrolled in [facility] program. She is interested in finding virtual mental health counseling and psychiatry. [facility] made to [facility] for both  services. Email sent to patient with coping skill worksheets and resource information. Patient was encouraged to increase her self-care routine until she can get established with a long term mental health provider. Patient agreeable. Warm transfer/hand off completed between [facility] Jennifer's. UPDATE- Patient has started psychiatry services at [facility] in [place], [place]. She reports that her psychiatrist is going to refer her to a therapist there (which she prefers.) [facility] Jennifer sent in basket message to her psychiatrist regarding this request as [facility] Jennifer is happy to help patient get connected with a long term therapist. Patient received a call from [facility] but declined their referral because she had just completed her new patient psychiatry appointment. If she is unable to gain a counselor at the [facility] in [place] then she will consider other resources. [facility] Jennifer placed referral for [facility] as well today and she will have an appointment tomorrow to address her financial needs which is a major stressor for her at this time. UPDATE- Patient completed psychiatry visit yesterday and had several medication adjustments. Patient was encouraged to contact psychiatrist office to schedule a 2 week follow up appointment. Patient agreeable to plan. She reports that she has started gaining back weight and her binge eating has increased due to several ongoing stressors. She reports that the financial resources that were provided to her by [facility]SW were resources that she was already aware of and she has been unable to gain any further financial relief. Patient is agreeable to referral to [facility] RNCM for weight loss support as she wishes to get bariatric surgery. CBT intervention implemented throughout session to help patient challenge her negative thinking habits. Patient was receptive to this intervention and is agreeable to follow up in a [name]  weeks.   Patient Self Care Activities:  Attends medical appointments  Patient Coping Strengths:  Has emotional support from nephew, Jennifer [name]  Patient Self Care Deficits:  Financial challenges Sleep issues Depression issues Anxiety issues  Patient Goals:  - spend time or talk with others at least 2 to 3 times per week - practice relaxation or meditation daily - keep a calendar with appointment dates  10 LITTLE Things To Do When You're Feeling Too Down To Do Anything  Take a shower. Even if you plan to stay in all day long and not see a soul, take a shower. It takes the most effort to hop in to the shower but once you do, you'll feel immediate results. It will wake you up and you'll be feeling much fresher (and cleaner too).  Brush and floss your teeth. Give your teeth a good brushing with a floss finish. It's a small task but it feels so good and you can check 'taking care of your health' off the list of things to do.  Do something small on your list. Most of [place] have some small thing we would like to get done (load of laundry, sew a button, email a friend). Doing one of these things will make you feel like  you've accomplished something.  Drink water. Drinking water is easy right? It's also really beneficial for your health so keep a glass beside you all day and take sips often. It gives you energy and prevents you from boredom eating.  Do some floor exercises. The last thing you want to do is exercise but it might be just the thing you need the most. Keep it simple and do exercises that involve sitting or laying on the floor. Even the smallest of exercises release chemicals in the brain that make you feel good. Yoga stretches or core exercises are going to make you feel good with minimal effort.  Make your bed. Making your bed takes a [name] minutes but it's productive and you'll feel relieved when it's done. An unmade bed is a huge visual reminder that you're having an  unproductive day. Do it and consider it your housework for the day.  Put on some nice clothes. Take the sweatpants off even if you don't plan to go anywhere. Put on clothes that make you feel good. Take a look in the mirror so your brain recognizes the sweatpants have been replaced with clothes that make you look great. It's an instant confidence booster.  Wash the dishes. A pile of dirty dishes in the sink is a reflection of your mood. It's possible that if you wash up the dishes, your mood will follow suit. It's worth a try.  Cook a real meal. If you have the luxury to have a "do nothing" day, you have time to make a real meal for yourself. Make a meal that you love to eat. The process is good to get you out of the funk and the food will ensure you have more energy for tomorrow.  Write out your thoughts by hand. When you hand write, you stimulate your brain to focus on the moment that you're in so make yourself comfortable and write whatever comes into your mind. Put those thoughts out on paper so they stop spinning around in your head. Those thoughts might be the very thing holding you down.  The following coping skill education was provided for stress relief and mental health management: "When your car dies or a deadline looms, how do you respond? Long-term, low-grade or acute stress takes a serious toll on your body and mind, so don't ignore feelings of constant tension. Stress is a natural part of life. However, too much stress can harm our health, especially if it continues every day. This is chronic stress and can put you at risk for heart problems like heart disease and depression. Understand what's happening inside your body and learn simple coping skills to combat the negative impacts of everyday stressors.  Types of Stress There are two types of stress: Emotional - types of emotional stress are relationship problems, pressure at work, financial worries, experiencing discrimination or  having a major life change. Physical - Examples of physical stress include being sick having pain, not sleeping well, recovery from an injury or having an alcohol and drug use disorder. Fight or Flight Sudden or ongoing stress activates your nervous system and floods your bloodstream with adrenaline and cortisol, two hormones that raise blood pressure, increase heart rate and spike blood sugar. These changes pitch your body into a fight or flight response. That enabled our ancestors to outrun saber-toothed tigers, and it's helpful today for situations like dodging a car accident. But most modern chronic stressors, such as finances or a challenging relationship, keep your body  in that heightened state, which hurts your health. Effects of Too Much Stress If constantly under stress, most of [place] will eventually start to function less well.  Multiple studies link chronic stress to a higher risk of heart disease, stroke, depression, weight gain, memory loss and even premature death, so it's important to recognize the warning signals. Talk to your doctor about ways to manage stress if you're experiencing any of these symptoms: Prolonged periods of poor sleep. Regular, severe headaches. Unexplained weight loss or gain. Feelings of isolation, withdrawal or worthlessness. Constant anger and irritability. Loss of interest in activities. Constant worrying or obsessive thinking. Excessive alcohol or drug use. Inability to concentrate.  10 Ways to Cope with Chronic Stress It's key to recognize stressful situations as they occur because it allows you to focus on managing how you react. We all need to know when to close our eyes and take a deep breath when we feel tension rising. Use these tips to prevent or reduce chronic stress. 1. Rebalance Work and Home All work and no play? If you're spending too much time at the office, intentionally put more dates in your calendar to enjoy time for fun, either alone or  with others. 2. Get Regular Exercise Moving your body on a regular basis balances the nervous system and increases blood circulation, helping to flush out stress hormones. Even a daily 20-minute walk makes a difference. Any kind of exercise can lower stress and improve your mood ? just pick activities that you enjoy and make it a regular habit. 3. Eat Well and Limit Alcohol and Stimulants Alcohol, nicotine and caffeine may temporarily relieve stress but have negative health impacts and can make stress worse in the long run. Well-nourished bodies cope better, so start with a good breakfast, add more organic fruits and vegetables for a well-balanced diet, avoid processed foods and sugar, try herbal tea and drink more water. 4. Connect with Supportive People Talking face to face with another person releases hormones that reduce stress. Lean on those good listeners in your life. 5. Carve Out Hobby Time Do you enjoy gardening, reading, listening to music or some other creative pursuit? Engage in activities that bring you pleasure and joy; research shows that reduces stress by almost half and lowers your heart rate, too. 6. Practice Meditation, Stress Reduction or Yoga Relaxation techniques activate a state of restfulness that counterbalances your body's fight-or-flight hormones. Even if this also means a 10-minute break in a long day: listen to music, read, go for a walk in nature, do a hobby, take a bath or spend time with a friend. Also consider doing a mindfulness exercise or try a daily deep breathing or imagery practice. Deep Breathing Slow, calm and deep breathing can help you relax. Try these steps to focus on your breathing and repeat as needed. Find a comfortable position and close your eyes. Exhale and drop your shoulders. Breathe in through your nose; fill your lungs and then your belly. Think of relaxing your body, quieting your mind and becoming calm and peaceful. Breathe out slowly through  your nose, relaxing your belly. Think of releasing tension, pain, worries or distress. Repeat steps three and four until you feel relaxed. Imagery This involves using your mind to excite the senses -- sound, vision, smell, taste and feeling. This may help ease your stress. Begin by getting comfortable and then do some slow breathing. Imagine a place you love being at. It could be somewhere from your childhood, somewhere you  vacationed or just a place in Clinical cytogeneticist. Feel how it is to be in the place you're imagining. Pay attention to the sounds, air, colors, and who is there with you. This is a place where you feel cared for and loved. All is well. You are safe. Take in all the smells, sounds, tastes and feelings. As you do, feel your body being nourished and healed. Feel the calm that surrounds you. Breathe in all the good. Breathe out any discomfort or tension. 7. Sleep Enough If you get less than seven to eight hours of sleep, your body won't tolerate stress as well as it could. If stress keeps you up at night, address the cause, and add extra meditation into your day to make up for the lost z's. Try to get seven to nine hours of sleep each night. Make a regular bedtime schedule. Keep your room dark and cool. Try to avoid computers, TV, cell phones and tablets before bed. 8. Bond with Connections You Enjoy Go out for a coffee with a friend, chat with a neighbor, call a family member, visit with a clergy member, or even hang out with your pet. Clinical studies show that spending even a short time with a companion animal can cut anxiety levels almost in half. 9. Take a Vacation Getting away from it all can reset your stress tolerance by increasing your mental and emotional outlook, which makes you a happier, more productive person upon return. Leave your cellphone and laptop at home! 10. See a Counselor, Coach or Therapist If negative thoughts overwhelm your ability to make positive changes,  it's time to seek professional help. Make an appointment today--your health and life are worth it."  If you are experiencing a Mental Health or Behavioral Health Crisis or need someone to talk to, please call the Suicide and Crisis Lifeline: 988    Follow up goal      Follow up:  Patient agrees to Care Plan and Follow-up.  Plan: The Managed Medicaid care management team will reach out to the patient again over the next 30 days.  Date/time of next scheduled Social Work care management/care coordination outreach:  10/12 at 1 pm  Jennifer [name], BSW, MSW, [employer] Managed Medicaid [facility]  [facility] [email or web address] Phone: [phone]

## 2022-08-16 ENCOUNTER — Encounter: Payer: 59 | Attending: General Surgery | Admitting: Dietician

## 2022-08-16 ENCOUNTER — Encounter: Payer: Self-pay | Admitting: Dietician

## 2022-08-16 ENCOUNTER — Ambulatory Visit: Payer: 59 | Admitting: Internal Medicine

## 2022-08-16 ENCOUNTER — Encounter: Payer: Self-pay | Admitting: Family Medicine

## 2022-08-16 DIAGNOSIS — E669 Obesity, unspecified: Secondary | ICD-10-CM | POA: Diagnosis not present

## 2022-08-16 NOTE — Progress Notes (Signed)
Supervised Weight Loss Visit Bariatric Nutrition Education  Planned surgery: RYGB Pt expectation of surgery: to be comfortable  4 out of 7 SWL Appointments   NUTRITION ASSESSMENT   Anthropometrics  Start weight at [facility]: 310.2 lbs (date: 04/30/2022)  Height: 67 in Weight: 315.5 lbs. BMI: 49.41 kg/m2    Clinical  Medical hx: Anxiety, arthritis, depression, GERD, HTN Medications: Megestrol, Mounjaro, buspirone oxybutynin, triamterene, venlafaxine XR  Labs: 12/03/2021: A1C 6.2 Notable signs/symptoms: none noted Any previous deficiencies? No  Lifestyle & Dietary Hx  Pt states she has a urinary tract infection, stating she needs to call the doctor. Pt states she has not been feeling well, stating she hasn't been eating much. Pt states she was able to get some whole grain bread. Pt states she is not drinking the Hi-C anymore. Pt asked about options for cereals.  Dietitian reviewed cereal/bread/food labels for sugar, fat content. Reviewed ingredient lists for whole grains.   Estimated daily fluid intake: 64+ oz Supplements:  Current average weekly physical activity: pool therapy, two days a week.  24-Hr Dietary Recall First Meal: protein shake Snack:  Second Meal: skip Snack:  Third Meal: grilled chicken sub (take out) Snack: hint of salt Triscuit Beverages: water, un-sweet tea, cherry cranberry, coffee  Estimated Energy Needs 1500   NUTRITION DIAGNOSIS  Overweight/obesity ([place]-3.3) related to past poor dietary habits and physical inactivity as evidenced by patient w/ planned RYGB surgery following dietary guidelines for continued weight loss.   NUTRITION INTERVENTION  Nutrition counseling (C-1) and education (E-2) to facilitate bariatric surgery goals.  Why you need complex carbohydrates: Whole grains and other complex carbohydrates are required to have a healthy diet. Whole grains provide fiber which can help with blood glucose levels and help keep you satiated. Fruits  and starchy vegetables provide essential vitamins and minerals required for immune function, eyesight support, brain support, bone density, wound healing and many other functions within the body. According to the current evidenced based 2020-2025 Dietary Guidelines for Americans, complex carbohydrates are part of a healthy eating pattern which is associated with a decreased risk for type 2 diabetes, cancers, and cardiovascular disease.  Reviewed cereal/bread/food labels for sugar, fat content. Reviewed ingredient lists for whole grains. Encouraged pt to eat throughout the day.   Pre-Op Goals Progress & New Goals Continue: reduce sugary drinks and soda Continue: water therapy Continue: Focus on whole grains Re-engage/ Continue: eat throughout the day; meal prep the day before to help reduce skipping meals.  Handouts Provided Include  Meal Ideas handout  Learning Style & Readiness for Change Teaching method utilized: Visual & Auditory  Demonstrated degree of understanding via: Teach Back  Readiness Level: contemplative Barriers to learning/adherence to lifestyle change: previous habits  RD's Notes for next Visit  Pt progress toward chosen goals   MONITORING & EVALUATION Dietary intake, weekly physical activity, body weight, and pre-op goals in 1 month.   Next Steps  Patient is to return to [facility] in 1 month for next SWL visit.

## 2022-08-17 ENCOUNTER — Ambulatory Visit (INDEPENDENT_AMBULATORY_CARE_PROVIDER_SITE_OTHER): Payer: 59 | Admitting: Internal Medicine

## 2022-08-17 ENCOUNTER — Encounter: Payer: Self-pay | Admitting: Internal Medicine

## 2022-08-17 VITALS — BP 134/87 | HR 108 | Resp 16 | Ht 67.0 in | Wt 315.4 lb

## 2022-08-17 DIAGNOSIS — N3001 Acute cystitis with hematuria: Secondary | ICD-10-CM | POA: Diagnosis not present

## 2022-08-17 DIAGNOSIS — N3090 Cystitis, unspecified without hematuria: Secondary | ICD-10-CM | POA: Diagnosis not present

## 2022-08-17 DIAGNOSIS — R69 Illness, unspecified: Secondary | ICD-10-CM | POA: Diagnosis not present

## 2022-08-17 DIAGNOSIS — R5382 Chronic fatigue, unspecified: Secondary | ICD-10-CM | POA: Diagnosis not present

## 2022-08-17 LAB — POCT URINALYSIS DIP (CLINITEK)
Bilirubin, UA: NEGATIVE
Glucose, UA: NEGATIVE mg/dL
Ketones, POC UA: NEGATIVE mg/dL
Nitrite, UA: NEGATIVE
POC PROTEIN,UA: 100 — AB
Spec Grav, UA: 1.015 (ref 1.010–1.025)
Urobilinogen, UA: 0.2 E.U./dL
pH, UA: 7.5 (ref 5.0–8.0)

## 2022-08-17 MED ORDER — NITROFURANTOIN MONOHYD MACRO 100 MG PO CAPS: 100.0000 mg | ORAL_CAPSULE | Freq: Two times a day (BID) | ORAL | 0 refills | Status: DC

## 2022-08-17 NOTE — Progress Notes (Signed)
Acute Office Visit  Subjective:    Patient ID: Jennifer [name], female    DOB: 11-06-1976, 46 y.o.   MRN: [id number]  Chief Complaint  Patient presents with   Flank Pain    X a couple months and right sided abdominal pain, wakes up at night to urinate, nauseated 2-3 times a week, feeling weak , no dysuria but does feel some pressure and fullness after urinating     HPI Jennifer [name] is a 46 y.o. female who complains of urinary frequency, urgency, without dysuria for 3 days. Has right sided abdominal pain. No CVA tenderness. She has experienced nausea. Review of records show she followed with urology for similar symptoms and she says these symptoms resolved with taking oxybutynin. She continues to take this medication, but developed these symptoms described this weekend. She is not sexually active. No change in vaginal discharge. She is not currently menstruating.    Past Medical History:  Diagnosis Date   Abnormal vaginal Pap smear    Acid reflux    Alopecia    Anxiety    Arthritis    Depression    Diabetes mellitus without complication (HCC)    prediabetic, takes no meds   Diverticulosis    Family history of diabetes mellitus    GERD (gastroesophageal reflux disease)    Hemorrhoids    History of recurrent UTIs    Hypertension    Obesity     Past Surgical History:  Procedure Laterality Date   APPENDECTOMY     COLONOSCOPY N/A 03/13/2014   Dr. Jena Gauss- grade 3 hemorrhoids, colonic diverticulosis bx= benign lymphoid polyp   COLONOSCOPY WITH PROPOFOL N/A 01/26/2021   Procedure: COLONOSCOPY WITH PROPOFOL;  Surgeon: Corbin Ade, MD;  Location: [facility];  Service: Endoscopy;  Laterality: N/A;  AM   KNEE ARTHROSCOPY     right   POLYPECTOMY  01/26/2021   Procedure: POLYPECTOMY;  Surgeon: Corbin Ade, MD;  Location: [facility];  Service: Endoscopy;;    Family History  Adopted: Yes  Problem Relation Age of Onset   Hypertension Mother    Diabetes Mother    Other  Mother        cholangiocarcinoma, age 31, deceased   Breast cancer Maternal Aunt    Colon cancer Neg Hx    Tuberous sclerosis Neg Hx    Alpha-1 antitrypsin deficiency Neg Hx     Social History   Socioeconomic History   Marital status: Single    Spouse name: Not on file   Number of children: 4   Years of education: Not on file   Highest education level: Some college, no degree  Occupational History   Occupation: UNEMPLOYWED SINCE 10/2010  Tobacco Use   Smoking status: Never   Smokeless tobacco: Never  Vaping Use   Vaping Use: Never used  Substance and Sexual Activity   Alcohol use: No   Drug use: Not Currently   Sexual activity: Not Currently    Birth control/protection: Pill  Other Topics Concern   Not on file  Social History Narrative   Not on file   Social Determinants of Health   Financial Resource Strain: High Risk (07/15/2022)   Overall Financial Resource Strain (CARDIA)    Difficulty of Paying Living Expenses: Hard  Food Insecurity: Food Insecurity Present (08/02/2022)   Hunger Vital Sign    Worried About Running Out of Food in the Last Year: Often true    Ran Out of Food in  the Last Year: Often true  Transportation Needs: No Transportation Needs (06/08/2021)   PRAPARE - Administrator, Civil Service (Medical): No    Lack of Transportation (Non-Medical): No  Recent Concern: Transportation Needs - Unmet Transportation Needs (04/06/2021)   PRAPARE - Transportation    Lack of Transportation (Medical): Yes    Lack of Transportation (Non-Medical): Yes  Physical Activity: Insufficiently Active (06/11/2022)   Exercise Vital Sign    Days of Exercise per Week: 2 days    Minutes of Exercise per Session: 10 min  Stress: Stress Concern Present (08/12/2022)   [employer] of Occupational Health - Occupational Stress Questionnaire    Feeling of Stress : Rather much  Social Connections: Moderately Isolated (06/08/2021)   Social Connection and Isolation Panel  [NHANES]    Frequency of Communication with Friends and Family: More than three times a week    Frequency of Social Gatherings with Friends and Family: Three times a week    Attends Religious Services: 1 to 4 times per year    Active Member of Clubs or Organizations: No    Attends Banker Meetings: Never    Marital Status: Never married  Intimate Partner Violence: Not At Risk (06/08/2021)   Humiliation, Afraid, Rape, and Kick questionnaire    Fear of Current or Ex-Partner: No    Emotionally Abused: No    Physically Abused: No    Sexually Abused: No    Outpatient Medications Prior to Visit  Medication Sig Dispense Refill   amLODipine (NORVASC) 5 MG tablet TAKE 1 TABLET (5 MG TOTAL) BY MOUTH DAILY. 90 tablet 1   clotrimazole-betamethasone (LOTRISONE) cream APPLY TO AFFECTED AREA TWICE A DAY 45 g 1   FLUoxetine (PROZAC) 20 MG capsule Take 1 capsule (20 mg total) by mouth daily. After finishing venlafaxine. 30 capsule 0   fluticasone (FLONASE) 50 MCG/ACT nasal spray Place 2 sprays into both nostrils daily. 16 g 6   gabapentin (NEURONTIN) 300 MG capsule Take 1 capsule (300 mg total) by mouth at bedtime. 30 capsule 3   INCASSIA 0.35 MG tablet Take 1 tablet by mouth daily.     megestrol (MEGACE) 40 MG tablet TAKE 2 TABLETS BY MOUTH EVERY DAY 60 tablet 3   meloxicam (MOBIC) 15 MG tablet Take 1 tablet (15 mg total) by mouth daily. 30 tablet 2   oxybutynin (DITROPAN-XL) 10 MG 24 hr tablet TAKE 1 TABLET BY MOUTH EVERYDAY AT BEDTIME 90 tablet 1   triamterene-hydrochlorothiazide (MAXZIDE) 75-50 MG tablet TAKE 1 TABLET BY MOUTH EVERY DAY 90 tablet 3   venlafaxine XR (EFFEXOR XR) 75 MG 24 hr capsule Take one pill once daily for one week then stop. 7 capsule 0   No facility-administered medications prior to visit.    No Known Allergies  Review of Systems  Constitutional:  Positive for fatigue. Negative for fever.  Genitourinary:  Positive for frequency, hematuria and urgency. Negative  for dysuria, flank pain, genital sores, menstrual problem, vaginal bleeding and vaginal discharge.       Objective:    Physical Exam Constitutional:      General: She is not in acute distress.    Appearance: She is obese. She is not toxic-appearing.  Cardiovascular:     Rate and Rhythm: Normal rate and regular rhythm.  Abdominal:     General: Bowel sounds are normal.     Palpations: Abdomen is soft.     Tenderness: There is no right CVA tenderness, left CVA tenderness  or guarding.     Comments: Suprapubic tenderness  Skin:    General: Skin is warm and dry.     BP 134/87   Pulse (!) 108   Resp 16   Ht 5\' 7"  (1.702 m)   Wt (!) 315 lb 6.4 oz (143.1 kg)   SpO2 97%   BMI 49.40 kg/m  Wt Readings from Last 3 Encounters:  08/17/22 (!) 315 lb 6.4 oz (143.1 kg)  08/16/22 (!) 315 lb 8 oz (143.1 kg)  08/11/22 (!) 318 lb 12.8 oz (144.6 kg)        Assessment & Plan:  Acute cystitis ASSESSMENT: Cystitis. Will obtain urine cultures and UA given patients more complicated history with UTIs.  PLAN: Treatment per orders. Start Macrobid today. Will follow urine culture to further direct therapy. Discussed Pyridium OTC prn. Patient says she does not need a pregnancy test as she has not been sexually active. Call or return to clinic prn if these symptoms worsen or fail to improve as anticipated.  1. Cystitis - Basic metabolic panel - nitrofurantoin, macrocrystal-monohydrate, (MACROBID) 100 MG capsule; Take 1 capsule (100 mg total) by mouth 2 (two) times daily.  Dispense: 10 capsule; Refill: 0 - Basic Metabolic Panel (BMET) - Urine Culture - POCT URINALYSIS DIP (CLINITEK)      Problem List Items Addressed This Visit   None Visit Diagnoses     Cystitis    -  Primary   Relevant Medications   nitrofurantoin, macrocrystal-monohydrate, (MACROBID) 100 MG capsule   Other Relevant Orders   Basic metabolic panel   Basic Metabolic Panel (BMET)   Urine Culture   POCT URINALYSIS DIP  (CLINITEK) (Completed)        Meds ordered this encounter  Medications   nitrofurantoin, macrocrystal-monohydrate, (MACROBID) 100 MG capsule    Sig: Take 1 capsule (100 mg total) by mouth 2 (two) times daily.    Dispense:  10 capsule    Refill:  0     Milus Banister, MD

## 2022-08-17 NOTE — Patient Instructions (Signed)
Thank you for trusting me with your care. To recap, today we discussed the following:   Urinary Tract Infection  Cystitis - Urinalysis - Urine Culture - Basic metabolic panel - nitrofurantoin, macrocrystal-monohydrate, (MACROBID) 100 MG capsule; Take 1 capsule (100 mg total) by mouth 2 (two) times daily.  Dispense: 10 capsule; Refill: 0   I will call you with results of these studies.

## 2022-08-17 NOTE — Assessment & Plan Note (Addendum)
ASSESSMENT: Acute cystitis with hematuria. Will obtain urine cultures and UA given patients more complicated history with UTIs.  PLAN: Treatment per orders. Start Macrobid today. Will follow urine culture to further direct therapy. Discussed Pyridium OTC prn. Patient says she does not need a pregnancy test as she has not been sexually active. Call or return to clinic prn if these symptoms worsen or fail to improve as anticipated.   1. Acute Cystitis with hematuria - Basic metabolic panel - nitrofurantoin, macrocrystal-monohydrate, (MACROBID) 100 MG capsule; Take 1 capsule (100 mg total) by mouth 2 (two) times daily.  Dispense: 10 capsule; Refill: 0 - Basic Metabolic Panel (BMET) - Urine Culture - POCT URINALYSIS DIP (CLINITEK)

## 2022-08-18 LAB — BASIC METABOLIC PANEL
BUN/Creatinine Ratio: 13 (ref 9–23)
BUN: 13 mg/dL (ref 6–24)
CO2: 19 mmol/L — ABNORMAL LOW (ref 20–29)
Calcium: 10.1 mg/dL (ref 8.7–10.2)
Chloride: 102 mmol/L (ref 96–106)
Creatinine, Ser: 1.01 mg/dL — ABNORMAL HIGH (ref 0.57–1.00)
Glucose: 89 mg/dL (ref 70–99)
Potassium: 3.8 mmol/L (ref 3.5–5.2)
Sodium: 141 mmol/L (ref 134–144)
eGFR: 69 mL/min/{1.73_m2} (ref >59)

## 2022-08-18 LAB — IRON,TIBC AND FERRITIN PANEL
%SAT: 18 % (calc) (ref 16–45)
Ferritin: 35 ng/mL (ref 16–232)
Iron: 78 ug/dL (ref 40–190)
TIBC: 439 mcg/dL (calc) (ref 250–450)

## 2022-08-18 LAB — VITAMIN B12: Vitamin B-12: 335 pg/mL (ref 200–1100)

## 2022-08-20 LAB — URINE CULTURE

## 2022-08-22 NOTE — Progress Notes (Signed)
Patient called and we discussed her results. Her symptoms are improving with antibiotic treatment for acute cystitis.   In addition she asked if Gabapentin prescribed for chronic back pain could cause urinary incontinence. This is an acute symptom and most likely from infection. Gabapentin helps her chronic pain, and she is going to continue medication.She is going to reach out to [employer] and restart her pool exercise classes.

## 2022-08-23 ENCOUNTER — Ambulatory Visit: Payer: 59 | Admitting: Dietician

## 2022-08-24 ENCOUNTER — Other Ambulatory Visit (HOSPITAL_COMMUNITY): Payer: Self-pay | Admitting: Psychiatry

## 2022-08-24 ENCOUNTER — Other Ambulatory Visit: Payer: Self-pay | Admitting: *Deleted

## 2022-08-24 ENCOUNTER — Other Ambulatory Visit: Payer: Self-pay | Admitting: Family Medicine

## 2022-08-24 DIAGNOSIS — F50819 Binge eating disorder, unspecified: Secondary | ICD-10-CM

## 2022-08-24 DIAGNOSIS — F5081 Binge eating disorder: Secondary | ICD-10-CM

## 2022-08-24 DIAGNOSIS — F410 Panic disorder [episodic paroxysmal anxiety] without agoraphobia: Secondary | ICD-10-CM

## 2022-08-24 DIAGNOSIS — F4310 Post-traumatic stress disorder, unspecified: Secondary | ICD-10-CM

## 2022-08-24 DIAGNOSIS — F332 Major depressive disorder, recurrent severe without psychotic features: Secondary | ICD-10-CM

## 2022-08-24 NOTE — Patient Outreach (Signed)
Care Coordination  08/24/2022  Naylani RAELYNNE LUDWICK 08-Mar-1976 [id number]   Successful outreach with Ms. Sheriff. She is now covered with [employer] and no longer with [employer]. RNCM advised patient to reach out to [facility] regarding Case Management and covered benefits. Ms. Lasala expressed understanding and thanked RNCM.  Lurena Joiner RN, BSN [place]  [employer] manager

## 2022-08-25 ENCOUNTER — Telehealth (INDEPENDENT_AMBULATORY_CARE_PROVIDER_SITE_OTHER): Payer: 59 | Admitting: Psychiatry

## 2022-08-25 ENCOUNTER — Encounter (HOSPITAL_COMMUNITY): Payer: Self-pay | Admitting: Psychiatry

## 2022-08-25 DIAGNOSIS — F411 Generalized anxiety disorder: Secondary | ICD-10-CM

## 2022-08-25 DIAGNOSIS — F4310 Post-traumatic stress disorder, unspecified: Secondary | ICD-10-CM | POA: Diagnosis not present

## 2022-08-25 DIAGNOSIS — F332 Major depressive disorder, recurrent severe without psychotic features: Secondary | ICD-10-CM | POA: Diagnosis not present

## 2022-08-25 DIAGNOSIS — F410 Panic disorder [episodic paroxysmal anxiety] without agoraphobia: Secondary | ICD-10-CM

## 2022-08-25 DIAGNOSIS — F5081 Binge eating disorder: Secondary | ICD-10-CM

## 2022-08-25 DIAGNOSIS — G4733 Obstructive sleep apnea (adult) (pediatric): Secondary | ICD-10-CM

## 2022-08-25 DIAGNOSIS — F5105 Insomnia due to other mental disorder: Secondary | ICD-10-CM

## 2022-08-25 DIAGNOSIS — R69 Illness, unspecified: Secondary | ICD-10-CM | POA: Diagnosis not present

## 2022-08-25 DIAGNOSIS — F50819 Binge eating disorder, unspecified: Secondary | ICD-10-CM

## 2022-08-25 MED ORDER — FLUOXETINE HCL 40 MG PO CAPS: 40.0000 mg | ORAL_CAPSULE | Freq: Every day | ORAL | 0 refills | Status: DC

## 2022-08-25 NOTE — Patient Instructions (Signed)
We increased your fluoxetine to '40mg'$  once daily. If you can, give your insurer a call to see if they will cover your CPAP machine as this should help with your daytime fatigue.
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
## 2022-08-26 ENCOUNTER — Other Ambulatory Visit: Payer: Self-pay

## 2022-08-26 NOTE — Patient Outreach (Signed)
Medicaid Managed Care   Unsuccessful Attempt Note   08/26/2022 Name: RIN NOVIELLO MRN: [id number] DOB: Jan 31, 1976  Referred by: [name], MD Reason for referral : High Risk Managed Medicaid   An unsuccessful telephone outreach was attempted today. The patient was referred to the case management team for assistance with care management and care coordination.    Follow Up Plan: A HIPAA compliant phone message was left for the patient providing contact information and requesting a return call. [facility] LCSW left instructions on voice mail for patient to reach out to [facility] regarding Case Management and covered benefits since we are unable to follow her case anymore due to insurance change.  Dickie La, BSW, MSW, [employer] Managed Medicaid LCSW [facility]  [facility] [email or web address] Phone: [phone]

## 2022-08-30 ENCOUNTER — Ambulatory Visit: Payer: Self-pay | Admitting: Dietician

## 2022-08-31 ENCOUNTER — Ambulatory Visit (INDEPENDENT_AMBULATORY_CARE_PROVIDER_SITE_OTHER): Payer: 59 | Admitting: Clinical

## 2022-08-31 ENCOUNTER — Encounter (HOSPITAL_COMMUNITY): Payer: Self-pay

## 2022-08-31 DIAGNOSIS — F410 Panic disorder [episodic paroxysmal anxiety] without agoraphobia: Secondary | ICD-10-CM

## 2022-08-31 DIAGNOSIS — F332 Major depressive disorder, recurrent severe without psychotic features: Secondary | ICD-10-CM | POA: Diagnosis not present

## 2022-08-31 DIAGNOSIS — R69 Illness, unspecified: Secondary | ICD-10-CM | POA: Diagnosis not present

## 2022-08-31 DIAGNOSIS — F411 Generalized anxiety disorder: Secondary | ICD-10-CM
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
## 2022-08-31 NOTE — Plan of Care (Signed)
Verbal Consent 

## 2022-08-31 NOTE — Telephone Encounter (Signed)
Awaiting PA thru cover my meds

## 2022-09-03 ENCOUNTER — Ambulatory Visit: Payer: Self-pay | Admitting: Dietician

## 2022-09-06 ENCOUNTER — Encounter: Payer: Self-pay | Admitting: Dietician

## 2022-09-06 ENCOUNTER — Other Ambulatory Visit: Payer: Self-pay

## 2022-09-06 ENCOUNTER — Telehealth: Payer: Self-pay

## 2022-09-06 ENCOUNTER — Encounter: Payer: 59 | Admitting: Dietician

## 2022-09-06 DIAGNOSIS — E669 Obesity, unspecified: Secondary | ICD-10-CM

## 2022-09-06 NOTE — Patient Instructions (Signed)
Visit Information  Jennifer [name] was given information about Medicaid Managed Care team care coordination services as a part of their Healthy [facility] Medicaid benefit. Jennifer [name] verbally consented to engagement with the [facility] Managed Care team.   If you are experiencing a medical emergency, please call 911 or report to your local emergency department or urgent care.   If you have a non-emergency medical problem during routine business hours, please contact your provider's office and ask to speak with a nurse.   For questions related to your Healthy [facility] health plan, please call: [phone] or visit the homepage here: [email or web address]  If you would like to schedule transportation through your Healthy [facility] plan, please call the following number at least 2 days in advance of your appointment: [phone]  For information about your ride after you set it up, call Ride Assist at [phone]. Use this number to activate a Will Call pickup, or if your transportation is late for a scheduled pickup. Use this number, too, if you need to make a change or cancel a previously scheduled reservation.  If you need transportation services right away, call [phone]. The after-hours call center is staffed 24 hours to handle ride assistance and urgent reservation requests (including discharges) 365 days a year. Urgent trips include sick visits, hospital discharge requests and life-sustaining treatment.  Call the [place] at [phone], at any time, 24 hours a day, 7 days a week. If you are in danger or need immediate medical attention call 911.  If you would like help to quit smoking, call 1-800-QUIT-NOW 727 392 9088) OR Espaol: 1-855-Djelo-Ya (6-599-357-0177) o para ms informacin haga clic aqu or Text READY to 200-400 to register via text  Jennifer [name] - following are the goals we discussed in your visit today:    Goals Addressed   None       Social Worker will follow up in 30 days.   Mickel Fuchs, BSW, [place] Managed Medicaid Team  [phone]   Following is a copy of your plan of care:  There are no care plans that you recently modified to display for this patient.

## 2022-09-06 NOTE — Patient Outreach (Signed)
Medicaid Managed Care Social Work Note  09/06/2022 Name:  Jennifer [name] MRN:  [id number] DOB:  1976-04-28  Jennifer [name] is an 46 y.o. year old female who is a primary patient of Jennifer Perches, MD.  The Medicaid Managed Care Coordination team was consulted for assistance with:  Community Resources   Ms. Gutman was given information about Medicaid Managed Care Coordination team services today. Jennifer [name] Patient agreed to services and verbal consent obtained.  Engaged with patient  for by telephone forfollow up visit in response to referral for case management and/or care coordination services.   Assessments/Interventions:  Review of past medical history, allergies, medications, health status, including review of consultants reports, laboratory and other test data, was performed as part of comprehensive evaluation and provision of chronic care management services.  SDOH: (Social Determinant of Health) assessments and interventions performed: SDOH Interventions    Flowsheet Row Patient Outreach Telephone from 08/12/2022 in [facility] Community Care Coordination Patient Outreach Telephone from 07/15/2022 in [facility] Community Care Coordination Chronic Care Management from 07/08/2022 in [facility] Chronic Care Management from 06/11/2022 in [facility] Chronic Care Management from 05/07/2022 in [facility] Chronic Care Management from 02/22/2022 in [facility]  SDOH Interventions        Depression Interventions/Treatment  -- -- Counseling Counseling, Medication Counseling, Medication --  Financial Strain Interventions -- -- -- -- -- Other (Comment)  [client is applying for Disability]  Physical Activity Interventions -- -- -- Other (Comments)  [client has difficulty walking sometimes. Client has difficulty standing sometimes] Other (Comments)  [walking challenges. She uses a cane to help her walk] Other (Comments)   [walking challenges. she uses a cane to help her walk]  Stress Interventions Provide Counseling, Offered [employer] Resources Provide Counseling, Offered Community Wellness Resources Provide Counseling  [client has stress related to managing medical needs] Provide Counseling  [client has stress related to difficulty sleeping. client has stress related to financial issues.] Provide Counseling  [client has stress related to financial issues] Provide Counseling  [client has stress related to financial challenges. client has stress in managing medical needs]     BSW completed a telephone outreach with patient, she stated she did receive the resources BSW provided but none of them were able to assist. Patient states she is unable to get child support because she does not have an address for the father.   Advanced Directives Status:  Not addressed in this encounter.  Care Plan                 No Known Allergies  Medications Reviewed Today     Reviewed by Jennifer [name], RD (Registered Dietitian) on 09/06/22 at 0935  Med List Status: <None>   Medication Order Taking? Sig Documenting Provider Last Dose Status Informant  amLODipine (NORVASC) 5 MG tablet [id number] No TAKE 1 TABLET (5 MG TOTAL) BY MOUTH DAILY. Jennifer Perches, MD Taking Active   clotrimazole-betamethasone Thurmond Butts) cream [id number] No APPLY TO AFFECTED AREA TWICE A DAY Jennifer Perches, MD Taking Active   FLUoxetine (PROZAC) 40 MG capsule [id number]  Take 1 capsule (40 mg total) by mouth daily. Jennifer Lincoln, MD  Active   fluticasone Jennifer [name]) 50 MCG/ACT nasal spray [id number] No Place 2 sprays into both nostrils daily. Jennifer Perches, MD Taking Active   gabapentin (NEURONTIN) 300 MG capsule [id number] No Take 1 capsule (300 mg total) by mouth at bedtime. Jennifer [name]  E, MD Taking Active   INCASSIA 0.35 MG tablet [id number] No Take 1 tablet by mouth daily. [provider] Taking Active   megestrol  (MEGACE) 40 MG tablet [id number] No TAKE 2 TABLETS BY MOUTH EVERY DAY Jennifer Hidalgo, DO Taking Active   meloxicam (MOBIC) 15 MG tablet [id number] No Take 1 tablet (15 mg total) by mouth daily. Jennifer Perches, MD Taking Active   nitrofurantoin, macrocrystal-monohydrate, (MACROBID) 100 MG capsule [id number]  Take 1 capsule (100 mg total) by mouth 2 (two) times daily. Jennifer Phlegm, MD  Active   oxybutynin (DITROPAN-XL) 10 MG 24 hr tablet [id number] No TAKE 1 TABLET BY MOUTH EVERYDAY AT BEDTIME Jennifer Perches, MD Taking Active   triamterene-hydrochlorothiazide [facility]) 75-50 MG tablet [id number] No TAKE 1 TABLET BY MOUTH EVERY DAY Jennifer Perches, MD Taking Active             Patient Active Problem List   Diagnosis Date Noted   Cystitis 08/17/2022   Severe episode of recurrent major depressive disorder, without psychotic features (HCC) 08/11/2022   PTSD (post-traumatic stress disorder) 08/11/2022   Binge eating disorder 08/11/2022   Irritable bowel syndrome with both constipation and diarrhea 07/21/2022   Mild obstructive sleep apnea 07/05/2022   Tonsillitis 07/05/2022   Trigger finger, right middle finger 07/02/2022   Colovesical fistula 12/08/2021    Class: Question of   Osteoarthritis of both knees 12/03/2021   Controlled diabetes mellitus type 2 with complications (HCC) 09/09/2021   Generalized arthritis 09/09/2021   Snoring 07/06/2021   Financial difficulties 05/09/2021   Reduced vision 04/03/2021   Hematochezia 09/09/2020   Right wrist pain 07/21/2020   Unsteady gait 07/14/2020   Poor urinary stream 05/27/2020   Environmental allergies 06/18/2019   Candidiasis of skin 01/22/2019   Back pain 11/28/2017   Essential hypertension 03/22/2016   Diverticulosis 11/23/2015   Hemorrhoids 03/26/2015   Insomnia due to other mental disorder 07/10/2014   Generalized anxiety disorder with panic attacks 07/10/2014   FHx: cancer of digestive organ 02/16/2014   Piles  (hemorrhoids) 02/14/2014   Acute cystitis 11/29/2013   Abnormal finding on Pap smear, HPV DNA positive 11/29/2013   Allergic rhinitis 02/12/2013   Prediabetes 03/26/2012   Dyspepsia 03/22/2012   TUBERCULOSIS 11/02/2010   Alopecia (capitis) totalis 11/02/2010   Chronic fatigue 07/23/2009   Morbid obesity (HCC) 11/24/2007    Conditions to be addressed/monitored per PCP order:   community resources  There are no care plans that you recently modified to display for this patient.   Follow up:  Patient agrees to Care Plan and Follow-up.  Plan: The Managed Medicaid care management team will reach out to the patient again over the next 30 days.  Date/time of next scheduled Social Work care management/care coordination outreach:  10/05/22  Gus Puma, Kenard Gower, [facility]  [facility]  High Risk Managed Medicaid Team  [phone]

## 2022-09-06 NOTE — Progress Notes (Signed)
Supervised Weight Loss Visit Bariatric Nutrition Education  Planned surgery: RYGB Pt expectation of surgery: to be comfortable  5 out of 7 SWL Appointments   NUTRITION ASSESSMENT   Anthropometrics  Start weight at [facility]: 310.2 lbs (date: 04/30/2022)  Height: 67 in Weight today: 318.1 lbs. BMI: 49.82 kg/m2    Clinical  Medical hx: Anxiety, arthritis, depression, GERD, HTN Medications: Megestrol, Mounjaro, buspirone oxybutynin, triamterene, venlafaxine XR, Prozac Labs: 12/03/2021: A1C 6.2 Notable signs/symptoms: none noted Any previous deficiencies? No  Lifestyle & Dietary Hx  Pt states she was on an antibiotic for a urinary tract infection. Pt states she is on a new medication, stating it is Prozac. Pt states she would rather sleep than eat. Pt states she sees a Social worker, stating last week was the first time talking to him.  Pt states she does not know when her next appointment is. Pt states her 26 year old daughter lives with her. Pt states she would like to do more pool therapy, stating she finished her therapy. Pt states she is appealing a disability decline, stating things will be less stressful for her finances, if she can get disability. Pt states she got some shrimp and whole wheat tortillas.  Estimated daily fluid intake: 64+ oz Supplements:  Current average weekly physical activity: no pool therapy lately, stating she has completed it.  24-Hr Dietary Recall First Meal: skip (slept) Snack:  Second Meal:  3p ham and cheese sandwich with whole grain bread Snack: beef jerky Third Meal: 6:30 (2) chicken taco from [employer] (take out) Snack:  Beverages: water, coffee, Hi-C (once)  Estimated Energy Needs 1500   NUTRITION DIAGNOSIS  Overweight/obesity ([place]-3.3) related to past poor dietary habits and physical inactivity as evidenced by patient w/ planned RYGB surgery following dietary guidelines for continued weight loss.   NUTRITION INTERVENTION  Nutrition  counseling (C-1) and education (E-2) to facilitate bariatric surgery goals.   Reviewed cereal/bread/food labels for sugar, fat content. Reviewed ingredient lists for whole grains. Encouraged pt to eat throughout the day. Encouraged pt to continue to eat balanced meals inclusive of non starchy vegetables 2 times a day 7 days a week Encouraged pt to choose lean protein sources: limiting beef, pork, sausage, hotdogs, and lunch meat   Pre-Op Goals Progress & New Goals Continue: reduce sugary drinks and soda Continue: water therapy Continue: Focus on whole grains Re-engage/ Continue: eat throughout the day; meal prep the day before to help reduce skipping meals. New: maintain regular sleep schedule and and schedule meals/snacks throughout the day  Handouts Provided Include    Learning Style & Readiness for Change Teaching method utilized: Visual & Auditory  Demonstrated degree of understanding via: Teach Back  Readiness Level: contemplative Barriers to learning/adherence to lifestyle change: previous habits  RD's Notes for next Visit  Pt progress toward chosen goals   MONITORING & EVALUATION Dietary intake, weekly physical activity, body weight, and pre-op goals in 1 month.   Next Steps  Patient is to return to [facility] in 1 month for next SWL visit.

## 2022-09-08 ENCOUNTER — Other Ambulatory Visit: Payer: Self-pay | Admitting: Family Medicine

## 2022-09-08 ENCOUNTER — Other Ambulatory Visit (HOSPITAL_COMMUNITY): Payer: Self-pay | Admitting: Psychiatry

## 2022-09-08 ENCOUNTER — Telehealth (INDEPENDENT_AMBULATORY_CARE_PROVIDER_SITE_OTHER): Payer: 59 | Admitting: Psychiatry

## 2022-09-08 DIAGNOSIS — F411 Generalized anxiety disorder: Secondary | ICD-10-CM

## 2022-09-08 DIAGNOSIS — F4310 Post-traumatic stress disorder, unspecified: Secondary | ICD-10-CM

## 2022-09-08 DIAGNOSIS — F50819 Binge eating disorder, unspecified: Secondary | ICD-10-CM

## 2022-09-08 DIAGNOSIS — F332 Major depressive disorder, recurrent severe without psychotic features: Secondary | ICD-10-CM

## 2022-09-08 DIAGNOSIS — G8929 Other chronic pain: Secondary | ICD-10-CM

## 2022-09-08 DIAGNOSIS — F5105 Insomnia due to other mental disorder: Secondary | ICD-10-CM | POA: Diagnosis not present

## 2022-09-08 DIAGNOSIS — R69 Illness, unspecified: Secondary | ICD-10-CM | POA: Diagnosis not present

## 2022-09-08 DIAGNOSIS — F5081 Binge eating disorder: Secondary | ICD-10-CM

## 2022-09-08 DIAGNOSIS — F99 Mental disorder, not otherwise specified: Secondary | ICD-10-CM

## 2022-09-08 DIAGNOSIS — M5440 Lumbago with sciatica, unspecified side: Secondary | ICD-10-CM

## 2022-09-08 DIAGNOSIS — F410 Panic disorder [episodic paroxysmal anxiety] without agoraphobia: Secondary | ICD-10-CM

## 2022-09-08 NOTE — Patient Instructions (Signed)
No medication changes today. Keep challenging those cognitive distortions like we discussed. Here is a worksheet that you can use: [email or web address]
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
## 2022-09-10 ENCOUNTER — Encounter: Payer: Self-pay | Admitting: Family Medicine

## 2022-09-10 ENCOUNTER — Ambulatory Visit (INDEPENDENT_AMBULATORY_CARE_PROVIDER_SITE_OTHER): Payer: 59 | Admitting: Family Medicine

## 2022-09-10 VITALS — BP 134/82 | HR 110 | Ht 67.0 in | Wt 319.1 lb

## 2022-09-10 DIAGNOSIS — I10 Essential (primary) hypertension: Secondary | ICD-10-CM | POA: Diagnosis not present

## 2022-09-10 DIAGNOSIS — R051 Acute cough: Secondary | ICD-10-CM

## 2022-09-10 DIAGNOSIS — E118 Type 2 diabetes mellitus with unspecified complications: Secondary | ICD-10-CM

## 2022-09-10 DIAGNOSIS — L0390 Cellulitis, unspecified: Secondary | ICD-10-CM | POA: Diagnosis not present

## 2022-09-10 LAB — GLUCOSE, POCT (MANUAL RESULT ENTRY): POC Glucose: 265 mg/dl — AB (ref 70–99)

## 2022-09-10 MED ORDER — FLUCONAZOLE 150 MG PO TABS: 150.0000 mg | ORAL_TABLET | Freq: Once | ORAL | 0 refills | Status: AC

## 2022-09-10 MED ORDER — DOXYCYCLINE HYCLATE 100 MG PO TABS: 100.0000 mg | ORAL_TABLET | Freq: Two times a day (BID) | ORAL | 0 refills | Status: DC

## 2022-09-10 MED ORDER — SEMAGLUTIDE(0.25 OR 0.5MG/DOS) 2 MG/1.5ML ~~LOC~~ SOPN: 0.5000 mg | PEN_INJECTOR | SUBCUTANEOUS | 0 refills | Status: DC

## 2022-09-10 MED ORDER — BENZONATATE 100 MG PO CAPS: 100.0000 mg | ORAL_CAPSULE | Freq: Two times a day (BID) | ORAL | 0 refills | Status: DC | PRN

## 2022-09-10 NOTE — Progress Notes (Unsigned)
Jennifer [name]     MRN: [id number]      DOB: 07-25-1976   HPI Ms. Kovalsky is here for follow up and re-evaluation of chronic medical conditions, medication management and review of any available recent lab and radiology data.  Preventive health is updated, specifically  Cancer screening and Immunization.   Questions or concerns regarding consultations or procedures which the PT has had in the interim are  addressed.Has complered water aerobics class Disappointed that unable to get med to help with weight loss The PT denies any adverse reactions to current medications since the last visit.  Cough ad sore throat x 1 week Possible chills, sputum getting thick, no sinus pressure or drainage Weak, thirsty, excess urination x 2 weeks Sores on nose, left forearm and abdomen ROS Denies recent fever or chills. Denies sinus pressure, nasal congestion, ear pain or sore throat. 1 week h/o increased cough with thick sputum. Denies chest pains, palpitations and leg swelling Denies abdominal pain, nausea, vomiting,diarrhea or constipation.   Denies dysuria, frequency, hesitancy or incontinence. chronic joint pain, swelling and limitation in mobility. Denies headaches, seizures, numbness, or tingling. Increased  depression, anxiety or insomnia.  PE  BP 134/82   Pulse (!) 110   Ht 5\' 7"  (1.702 m)   Wt (!) 319 lb 1.3 oz (144.7 kg)   SpO2 95%   BMI 49.97 kg/m   Patient alert and oriented and in no cardiopulmonary distress.Flat affect  HEENT: No facial asymmetry, EOMI,     Neck supple .  Chest: Clear to auscultation bilaterally.no crackles or wheezes, decreased air entry  CVS: S1, S2 no murmurs, no S3.Regular rate.  ABD: Soft non tender.   Ext: No edema  MS: decreased  ROM spine, shoulders, hips and knees.  Skin: infected lesions  on abdomen, nose and forearm Psych: Good eye contact, normal affect. Memory intact not anxious or depressed appearing.  CNS: CN 2-12 intact, power,   normal throughout.no focal deficits noted.   Assessment & Plan  Controlled diabetes mellitus type 2 with complications [facility]) Ms. Shroff is reminded of the importance of commitment to daily physical activity for 30 minutes or more, as able and the need to limit carbohydrate intake to 30 to 60 grams per meal to help with blood sugar control.   The need to take medication as prescribed, test blood sugar as directed, and to call between visits if there is a concern that blood sugar is uncontrolled is also discussed.   Ms. Guglielmi is reminded of the importance of daily foot exam, annual eye examination, and good blood sugar, blood pressure and cholesterol control.     Latest Ref Rng & Units 09/10/2022    9:55 AM 08/17/2022    9:17 AM 07/02/2022   11:39 AM 01/28/2022    8:15 AM 12/03/2021   10:18 AM  Diabetic Labs  HbA1c 4.8 - 5.6 % 6.2   5.7   6.2   Micro/Creat Ratio 0 - 29 mg/g creat 28       Creatinine 0.57 - 1.00 mg/dL 7.82  9.56   2.13  0.86       09/10/2022    9:30 AM 09/10/2022    9:06 AM 09/10/2022    9:02 AM 09/06/2022    8:53 AM 08/17/2022    8:39 AM 08/16/2022    9:36 AM 08/11/2022   11:37 AM  BP/Weight  Systolic BP 134 148 150  134  578  Diastolic BP 82 78 83  87  88  Wt. (Lbs)   319.08 318.1 315.4 315.5 318.8  BMI   49.97 kg/m2 49.82 kg/m2 49.4 kg/m2 49.41 kg/m2 49.93 kg/m2       No data to display            Need to get medication that is covered  Essential hypertension Controlled, no change in medication DASH diet and commitment to daily physical activity for a minimum of 30 minutes discussed and encouraged, as a part of hypertension management. The importance of attaining a healthy weight is also discussed.     09/10/2022    9:30 AM 09/10/2022    9:06 AM 09/10/2022    9:02 AM 09/06/2022    8:53 AM 08/17/2022    8:39 AM 08/16/2022    9:36 AM 08/11/2022   11:37 AM  BP/Weight  Systolic BP 134 148 150  134  409  Diastolic BP 82 78 83  87  88  Wt.  (Lbs)   319.08 318.1 315.4 315.5 318.8  BMI   49.97 kg/m2 49.82 kg/m2 49.4 kg/m2 49.41 kg/m2 49.93 kg/m2       Morbid obesity (HCC)  Patient re-educated about  the importance of commitment to a  minimum of 150 minutes of exercise per week as able.  The importance of healthy food choices with portion control discussed, as well as eating regularly and within a 12 hour window most days. The need to choose "clean , green" food 50 to 75% of the time is discussed, as well as to make water the primary drink and set a goal of 64 ounces water daily.       09/10/2022    9:02 AM 09/06/2022    8:53 AM 08/17/2022    8:39 AM  Weight /BMI  Weight 319 lb 1.3 oz 318 lb 1.6 oz 315 lb 6.4 oz  Height 5\' 7"  (1.702 m) 5\' 7"  (1.702 m) 5\' 7"  (1.702 m)  BMI 49.97 kg/m2 49.82 kg/m2 49.4 kg/m2      Cellulitis Infected skin lesions in different areas, doxycycline prescribed  Cough Tessalon perles and doxycycline prescribed

## 2022-09-10 NOTE — Patient Instructions (Addendum)
F/U  3rd week in November, call if you need me sooner  HBA1C, chem 7 and EGFR,,microalb today  Tessalon perles, doxycycline and fluconazole are prescribed for cough and skin infection  Semaglutide prescribed for blood sugar and weight, blood sugar in office today is high  Thanks for choosing [facility], we consider it a privelige to serve you.

## 2022-09-13 ENCOUNTER — Encounter: Payer: Self-pay | Admitting: Family Medicine

## 2022-09-13 DIAGNOSIS — R059 Cough, unspecified: Secondary | ICD-10-CM

## 2022-09-13 DIAGNOSIS — L0390 Cellulitis, unspecified: Secondary | ICD-10-CM | POA: Insufficient documentation

## 2022-09-13 HISTORY — DX: Cough, unspecified: R05.9

## 2022-09-13 LAB — BMP8+EGFR
BUN/Creatinine Ratio: 13 (ref 9–23)
BUN: 14 mg/dL (ref 6–24)
CO2: 23 mmol/L (ref 20–29)
Calcium: 9.5 mg/dL (ref 8.7–10.2)
Chloride: 103 mmol/L (ref 96–106)
Creatinine, Ser: 1.04 mg/dL — ABNORMAL HIGH (ref 0.57–1.00)
Glucose: 92 mg/dL (ref 70–99)
Potassium: 3.6 mmol/L (ref 3.5–5.2)
Sodium: 143 mmol/L (ref 134–144)
eGFR: 66 mL/min/{1.73_m2} (ref >59)

## 2022-09-13 LAB — MICROALBUMIN / CREATININE URINE RATIO
Creatinine, Urine: 140.4 mg/dL
Microalb/Creat Ratio: 28 mg/g creat (ref 0–29)
Microalbumin, Urine: 38.9 ug/mL

## 2022-09-13 LAB — HEMOGLOBIN A1C
Est. average glucose Bld gHb Est-mCnc: 131 mg/dL
Hgb A1c MFr Bld: 6.2 % — ABNORMAL HIGH (ref 4.8–5.6)

## 2022-09-13 NOTE — Assessment & Plan Note (Signed)
Tessalon perles and doxycycline prescribed

## 2022-09-13 NOTE — Assessment & Plan Note (Signed)
Jennifer [name] is reminded of the importance of commitment to daily physical activity for 30 minutes or more, as able and the need to limit carbohydrate intake to 30 to 60 grams per meal to help with blood sugar control.   The need to take medication as prescribed, test blood sugar as directed, and to call between visits if there is a concern that blood sugar is uncontrolled is also discussed.   Jennifer [name] is reminded of the importance of daily foot exam, annual eye examination, and good blood sugar, blood pressure and cholesterol control.     Latest Ref Rng & Units 09/10/2022    9:55 AM 08/17/2022    9:17 AM 07/02/2022   11:39 AM 01/28/2022    8:15 AM 12/03/2021   10:18 AM  Diabetic Labs  HbA1c 4.8 - 5.6 % 6.2   5.7   6.2   Micro/Creat Ratio 0 - 29 mg/g creat 28       Creatinine 0.57 - 1.00 mg/dL 1.04  1.01   1.37  1.20       09/10/2022    9:30 AM 09/10/2022    9:06 AM 09/10/2022    9:02 AM 09/06/2022    8:53 AM 08/17/2022    8:39 AM 08/16/2022    9:36 AM 08/11/2022   11:37 AM  BP/Weight  Systolic BP 004 599 774  142  395  Diastolic BP 82 78 83  87  88  Wt. (Lbs)   319.08 318.1 315.4 315.5 318.8  BMI   49.97 kg/m2 49.82 kg/m2 49.4 kg/m2 49.41 kg/m2 49.93 kg/m2       No data to display            Need to get medication that is covered

## 2022-09-13 NOTE — Assessment & Plan Note (Signed)
Controlled, no change in medication DASH diet and commitment to daily physical activity for a minimum of 30 minutes discussed and encouraged, as a part of hypertension management. The importance of attaining a healthy weight is also discussed.     09/10/2022    9:30 AM 09/10/2022    9:06 AM 09/10/2022    9:02 AM 09/06/2022    8:53 AM 08/17/2022    8:39 AM 08/16/2022    9:36 AM 08/11/2022   11:37 AM  BP/Weight  Systolic BP 347 425 956  387  564  Diastolic BP 82 78 83  87  88  Wt. (Lbs)   319.08 318.1 315.4 315.5 318.8  BMI   49.97 kg/m2 49.82 kg/m2 49.4 kg/m2 49.41 kg/m2 49.93 kg/m2

## 2022-09-13 NOTE — Assessment & Plan Note (Signed)
Infected skin lesions in different areas, doxycycline prescribed

## 2022-09-13 NOTE — Assessment & Plan Note (Signed)
  Patient re-educated about  the importance of commitment to a  minimum of 150 minutes of exercise per week as able.  The importance of healthy food choices with portion control discussed, as well as eating regularly and within a 12 hour window most days. The need to choose "clean , green" food 50 to 75% of the time is discussed, as well as to make water the primary drink and set a goal of 64 ounces water daily.       09/10/2022    9:02 AM 09/06/2022    8:53 AM 08/17/2022    8:39 AM  Weight /BMI  Weight 319 lb 1.3 oz 318 lb 1.6 oz 315 lb 6.4 oz  Height '5\' 7"'$  (1.702 m) '5\' 7"'$  (1.702 m) '5\' 7"'$  (1.702 m)  BMI 49.97 kg/m2 49.82 kg/m2 49.4 kg/m2

## 2022-09-14 ENCOUNTER — Other Ambulatory Visit: Payer: Self-pay | Admitting: Family Medicine

## 2022-09-20 ENCOUNTER — Encounter: Payer: Self-pay | Admitting: Dietician

## 2022-09-20 ENCOUNTER — Encounter: Payer: 59 | Attending: General Surgery | Admitting: Dietician

## 2022-09-20 VITALS — Ht 67.0 in | Wt 317.6 lb

## 2022-09-20 DIAGNOSIS — E669 Obesity, unspecified: Secondary | ICD-10-CM | POA: Diagnosis not present

## 2022-09-20 NOTE — Progress Notes (Signed)
Supervised Weight Loss Visit Bariatric Nutrition Education  Planned surgery: RYGB Pt expectation of surgery: to be comfortable  Pt completed visits.   Pt has cleared nutrition requirements.   NUTRITION ASSESSMENT   Anthropometrics  Start weight at [facility]: 310.2 lbs (date: 04/30/2022)  Height: 67 in Weight today: 317.6 lbs. BMI: 49.74 kg/m2    Clinical  Medical hx: Anxiety, arthritis, depression, GERD, HTN Medications: Megestrol, Mounjaro, buspirone oxybutynin, triamterene, venlafaxine XR, Prozac Labs: 12/03/2021: A1C 6.2 Notable signs/symptoms: none noted Any previous deficiencies? No  Lifestyle & Dietary Hx  Pt states she took a road trip with family, stating she made some good choices. Pt states she is sharing information to friends and family, stating telling them to stay away from sugary drinks and foods. Pt states she has done well maintaining her weight, and making healthy food choices, staying away from sweets. Pt states she still needs to focus on not drinking sodas after surgery.  Estimated daily fluid intake: 64+ oz Supplements:  Current average weekly physical activity: no pool therapy lately, stating she has completed it.  24-Hr Dietary Recall First Meal: sausage biscuit or fruit  Snack: triscuit  Second Meal:  chicken salad or tuna salad or spinach salad with ham or [place] Snack: beef jerky Third Meal: 6:30 lean cuisine meal or spinach salad Snack: fruit or triscuit Beverages: water, coffee, zero calorie flavoring for water.  Estimated Energy Needs 1500  NUTRITION DIAGNOSIS  Overweight/obesity (Poneto-3.3) related to past poor dietary habits and physical inactivity as evidenced by patient w/ planned RYGB surgery following dietary guidelines for continued weight loss.   NUTRITION INTERVENTION  Nutrition counseling (C-1) and education (E-2) to facilitate bariatric surgery goals.   Reviewed cereal/bread/food labels for sugar, fat content. Reviewed ingredient  lists for whole grains. Encouraged pt to eat throughout the day. Encouraged pt to continue to eat balanced meals inclusive of non starchy vegetables 2 times a day 7 days a week Encouraged pt to choose lean protein sources: limiting beef, pork, sausage, hotdogs, and lunch meat Reviewed Pre-op goals hand out.   Pre-Op Goals Progress & New Goals Continue: reduce sugary drinks and soda Continue: Focus on whole grains Re-engage/ Continue: eat throughout the day; meal prep the day before to help reduce skipping meals. Continue: maintain regular sleep schedule and and schedule meals/snacks throughout the day  Handouts Provided Include  Pre-Op Goals  Learning Style & Readiness for Change Teaching method utilized: Visual & Auditory  Demonstrated degree of understanding via: Teach Back  Readiness Level: contemplative Barriers to learning/adherence to lifestyle change: mobility, previous habits  RD's Notes for next Visit  Pt progress toward chosen goals   MONITORING & EVALUATION Dietary intake, weekly physical activity, body weight, and pre-op goals.   Next Steps  Pt has completed visits. No further supervised visits required/recomended  Patient is to follow up at [facility] for Pre-Op Class >2 weeks before surgery for further nutrition education.

## 2022-09-22 ENCOUNTER — Encounter: Payer: Self-pay | Admitting: Family Medicine

## 2022-09-22 ENCOUNTER — Telehealth (INDEPENDENT_AMBULATORY_CARE_PROVIDER_SITE_OTHER): Payer: 59 | Admitting: Psychiatry

## 2022-09-22 DIAGNOSIS — F332 Major depressive disorder, recurrent severe without psychotic features: Secondary | ICD-10-CM

## 2022-09-23 NOTE — Telephone Encounter (Signed)
Please advise pt see 09/10/22

## 2022-09-24 NOTE — Progress Notes (Signed)
Patient unable to get video working, rescheduling for in person to assist with getting video functional again.

## 2022-09-27 ENCOUNTER — Telehealth (HOSPITAL_COMMUNITY): Payer: 59 | Admitting: Psychiatry

## 2022-09-27 ENCOUNTER — Ambulatory Visit: Payer: 59 | Admitting: Dietician

## 2022-09-28 ENCOUNTER — Telehealth (INDEPENDENT_AMBULATORY_CARE_PROVIDER_SITE_OTHER): Payer: 59 | Admitting: Psychiatry

## 2022-09-28 ENCOUNTER — Encounter (HOSPITAL_COMMUNITY): Payer: Self-pay | Admitting: Psychiatry

## 2022-09-28 DIAGNOSIS — F5081 Binge eating disorder: Secondary | ICD-10-CM | POA: Diagnosis not present

## 2022-09-28 DIAGNOSIS — R69 Illness, unspecified: Secondary | ICD-10-CM | POA: Diagnosis not present

## 2022-09-28 DIAGNOSIS — F5105 Insomnia due to other mental disorder: Secondary | ICD-10-CM | POA: Diagnosis not present

## 2022-09-28 DIAGNOSIS — F4310 Post-traumatic stress disorder, unspecified: Secondary | ICD-10-CM | POA: Diagnosis not present

## 2022-09-28 DIAGNOSIS — G4733 Obstructive sleep apnea (adult) (pediatric): Secondary | ICD-10-CM

## 2022-09-28 DIAGNOSIS — F410 Panic disorder [episodic paroxysmal anxiety] without agoraphobia: Secondary | ICD-10-CM

## 2022-09-28 DIAGNOSIS — F332 Major depressive disorder, recurrent severe without psychotic features: Secondary | ICD-10-CM | POA: Diagnosis not present

## 2022-09-28 DIAGNOSIS — F99 Mental disorder, not otherwise specified: Secondary | ICD-10-CM

## 2022-09-28 DIAGNOSIS — R5382 Chronic fatigue, unspecified: Secondary | ICD-10-CM | POA: Diagnosis not present

## 2022-09-28 DIAGNOSIS — F411 Generalized anxiety disorder: Secondary | ICD-10-CM

## 2022-09-28 DIAGNOSIS — K582 Mixed irritable bowel syndrome: Secondary | ICD-10-CM | POA: Diagnosis not present

## 2022-09-28 DIAGNOSIS — M170 Bilateral primary osteoarthritis of knee: Secondary | ICD-10-CM | POA: Diagnosis not present

## 2022-09-28 DIAGNOSIS — F50819 Binge eating disorder, unspecified: Secondary | ICD-10-CM

## 2022-09-28 MED ORDER — FLUOXETINE HCL 20 MG PO CAPS: 60.0000 mg | ORAL_CAPSULE | Freq: Every day | ORAL | 0 refills | Status: DC
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
## 2022-09-28 NOTE — Patient Instructions (Signed)
We increased your prozac to '60mg'$  (3 capsules) daily today. At our next visit, we may add the next medication vyvanse to your regimen to help with the impulsive eating. Please reach out to your insurer to see if your CPAP mask is covered, this should help with your chronic fatigue.

## 2022-09-29 ENCOUNTER — Ambulatory Visit (INDEPENDENT_AMBULATORY_CARE_PROVIDER_SITE_OTHER): Payer: 59 | Admitting: Clinical

## 2022-09-29 DIAGNOSIS — F332 Major depressive disorder, recurrent severe without psychotic features: Secondary | ICD-10-CM

## 2022-09-29 DIAGNOSIS — F410 Panic disorder [episodic paroxysmal anxiety] without agoraphobia: Secondary | ICD-10-CM | POA: Diagnosis not present

## 2022-09-29 DIAGNOSIS — R69 Illness, unspecified: Secondary | ICD-10-CM | POA: Diagnosis not present

## 2022-09-29 DIAGNOSIS — F411 Generalized anxiety disorder: Secondary | ICD-10-CM | POA: Diagnosis not present

## 2022-09-29 NOTE — Progress Notes (Signed)
Virtual Visit via Telephone Note  I connected with Jennifer [name] on 09/29/22 at  9:00 AM EST by telephone and verified that I am speaking with the correct person using two identifiers.  Location: Patient: Home Provider: Office   I discussed the limitations, risks, security and privacy concerns of performing an evaluation and management service by telephone and the availability of in person appointments. I also discussed with the patient that there may be a patient responsible charge related to this service. The patient expressed understanding and agreed to proceed.   THERAPIST PROGRESS NOTE   Session Time: 9:00 AM-:9:30 AM   Participation Level: Active   Behavioral Response: CasualAlertDepressed   Type of Therapy: Individual Therapy   Treatment Goals addressed: Coping   Interventions: CBT, DBT, Solution Focused, Strength-based and Supportive   Summary: Jennifer [name] is a 46 y.o. female who presents with Depression/ Anxiety. The OPT therapist worked with the patient for her ongoing OPT treatment session. The OPT therapist utilized Motivational Interviewing to assist in creating therapeutic repore.The patient in the session was engaged and work in collaboration giving feedback about her triggers and symptoms over the past few weeks.The patient spoke about her recent changes with med therapy.The OPT therapist utilized Cognitive Behavioral Therapy through cognitive restructuring as well as worked with the patient on coping strategies to assist in management of mental health symptoms. The patient verbalized, "My goal right now is to get my house better organized but due to knee and back pain I can only work a little while and then have to stop". The patient spoke about the impact of being under the weather and this impacting her  mood.  The patient spoke about her schedule and routine including taking her child to and from school throughout the week. The OPT therapist placed emphasis on the  patient keeping all upcoming health appointments and adhering to ongoing health treatment recommendations. The OPT therapist reviewed with the patient all of her upcoming appointments as listed in the patients MyChart. The patient spoke about plans for the upcoming Thanksgiving and Christmas holidays.   Suicidal/Homicidal: Nowithout intent/plan   Therapist Response:The OPT therapist worked with the patient for the patients scheduled session. The patient was engaged in her session and gave feedback in relation to triggers, symptoms, and behavior responses over the past few weeks. The patient is currently under the weather and will be reviewing her cold like symptoms with her PCP if this does not soon improve. The OPT therapist worked with the patient utilizing an in session Cognitive Behavioral Therapy exercise. The patient spoke about her adjustment with recent med therapy changes . The patient spoke about being active in the home within her limits. The patient spoke about the upcoming holidays and plans to spend them at home with her family and will not be traveling. The patient has been taking OTC medicine for her cold like symptoms and was urged to see her PCP by the OPT if her symptoms do not improve or get worse. The OPT therapist will continue treatment work with the patient in her next scheduled session.   Plan: Return again in 2/3 weeks.   Diagnosis:      Axis I:Depression/Anxiety                           Axis II: No diagnosis     Collaboration of Care: Overview of patient involvement with the med management program with prescriber psychiatrist Dr.  Hisada   Patient/Guardian was advised Release of Information must be obtained prior to any record release in order to collaborate their care with an outside provider. Patient/Guardian was advised if they have not already done so to contact the registration department to sign all necessary forms in order for [place] to release information regarding their  care.    Consent: Patient/Guardian gives verbal consent for treatment and assignment of benefits for services provided during this visit. Patient/Guardian expressed understanding and agreed to proceed.      I discussed the assessment and treatment plan with the patient. The patient was provided an opportunity to ask questions and all were answered. The patient agreed with the plan and demonstrated an understanding of the instructions.   The patient was advised to call back or seek an in-person evaluation if the symptoms worsen or if the condition fails to improve as anticipated.   I provided 30 minutes of non-face-to-face time during this encounter.   Maye Hides, LCSW   09/29/2022

## 2022-10-05 ENCOUNTER — Other Ambulatory Visit: Payer: 59

## 2022-10-05 NOTE — Patient Outreach (Signed)
Medicaid Managed Care   Unsuccessful Outreach Note  10/05/2022 Name: Jennifer [name] MRN: [id number] DOB: Feb 25, 1976  Referred by: [name], MD Reason for referral : High Risk Managed Medicaid (MM Social work telephone outreach )   An unsuccessful telephone outreach was attempted today. The patient was referred to the case management team for assistance with care management and care coordination.   Follow Up Plan: A HIPAA compliant phone message was left for the patient providing contact information and requesting a return call.   Gus Puma, BSW, [facility]  [place]  High Risk Managed Medicaid Team  [phone]

## 2022-10-05 NOTE — Patient Instructions (Signed)
Medicaid Managed Care   Unsuccessful Outreach Note  10/05/2022 Name: Jennifer [name] MRN: [id number] DOB: Feb 25, 1976  Referred by: [name], MD Reason for referral : High Risk Managed Medicaid (MM Social work telephone outreach )   An unsuccessful telephone outreach was attempted today. The patient was referred to the case management team for assistance with care management and care coordination.   Follow Up Plan: A HIPAA compliant phone message was left for the patient providing contact information and requesting a return call.   Gus Puma, BSW, [facility]  [place]  High Risk Managed Medicaid Team  [phone]

## 2022-10-06 ENCOUNTER — Other Ambulatory Visit (HOSPITAL_COMMUNITY): Payer: Self-pay | Admitting: Psychiatry

## 2022-10-06 ENCOUNTER — Other Ambulatory Visit: Payer: Self-pay | Admitting: Family Medicine

## 2022-10-06 DIAGNOSIS — F410 Panic disorder [episodic paroxysmal anxiety] without agoraphobia: Secondary | ICD-10-CM

## 2022-10-06 DIAGNOSIS — F4310 Post-traumatic stress disorder, unspecified: Secondary | ICD-10-CM

## 2022-10-06 DIAGNOSIS — F5081 Binge eating disorder: Secondary | ICD-10-CM

## 2022-10-06 DIAGNOSIS — F332 Major depressive disorder, recurrent severe without psychotic features: Secondary | ICD-10-CM

## 2022-10-12 ENCOUNTER — Encounter (HOSPITAL_COMMUNITY): Payer: Self-pay | Admitting: Psychiatry

## 2022-10-12 ENCOUNTER — Telehealth (INDEPENDENT_AMBULATORY_CARE_PROVIDER_SITE_OTHER): Payer: 59 | Admitting: Psychiatry

## 2022-10-12 ENCOUNTER — Other Ambulatory Visit: Payer: Self-pay | Admitting: Family Medicine

## 2022-10-12 DIAGNOSIS — F411 Generalized anxiety disorder: Secondary | ICD-10-CM | POA: Diagnosis not present

## 2022-10-12 DIAGNOSIS — F5081 Binge eating disorder: Secondary | ICD-10-CM | POA: Diagnosis not present

## 2022-10-12 DIAGNOSIS — F332 Major depressive disorder, recurrent severe without psychotic features: Secondary | ICD-10-CM

## 2022-10-12 DIAGNOSIS — M170 Bilateral primary osteoarthritis of knee: Secondary | ICD-10-CM | POA: Diagnosis not present

## 2022-10-12 DIAGNOSIS — G4733 Obstructive sleep apnea (adult) (pediatric): Secondary | ICD-10-CM | POA: Diagnosis not present

## 2022-10-12 DIAGNOSIS — F410 Panic disorder [episodic paroxysmal anxiety] without agoraphobia: Secondary | ICD-10-CM | POA: Diagnosis not present

## 2022-10-12 DIAGNOSIS — F4310 Post-traumatic stress disorder, unspecified: Secondary | ICD-10-CM

## 2022-10-12 DIAGNOSIS — F99 Mental disorder, not otherwise specified: Secondary | ICD-10-CM

## 2022-10-12 DIAGNOSIS — R5382 Chronic fatigue, unspecified: Secondary | ICD-10-CM

## 2022-10-12 DIAGNOSIS — F5105 Insomnia due to other mental disorder: Secondary | ICD-10-CM

## 2022-10-12 DIAGNOSIS — R69 Illness, unspecified: Secondary | ICD-10-CM | POA: Diagnosis not present

## 2022-10-12 MED ORDER — NORETHINDRONE 0.35 MG PO TABS: 1.0000 | ORAL_TABLET | Freq: Every day | ORAL | 11 refills | Status: DC
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
## 2022-10-12 NOTE — Patient Instructions (Addendum)
We didn't make any medication changes today. Check out this link for Healthy at Every Size: [email or web address].[email or web address].no  Try to stop weighing yourself every Tuesday. I will also message Dr. Moshe Cipro to do blind weights for you.

## 2022-10-22 ENCOUNTER — Encounter: Payer: Self-pay | Admitting: Family Medicine

## 2022-10-22 ENCOUNTER — Ambulatory Visit (INDEPENDENT_AMBULATORY_CARE_PROVIDER_SITE_OTHER): Payer: 59 | Admitting: Family Medicine

## 2022-10-22 ENCOUNTER — Other Ambulatory Visit (HOSPITAL_COMMUNITY): Payer: Self-pay | Admitting: Psychiatry

## 2022-10-22 VITALS — BP 120/86 | HR 94 | Ht 67.0 in | Wt 315.1 lb

## 2022-10-22 DIAGNOSIS — R2231 Localized swelling, mass and lump, right upper limb: Secondary | ICD-10-CM | POA: Diagnosis not present

## 2022-10-22 DIAGNOSIS — F332 Major depressive disorder, recurrent severe without psychotic features: Secondary | ICD-10-CM

## 2022-10-22 DIAGNOSIS — F410 Panic disorder [episodic paroxysmal anxiety] without agoraphobia: Secondary | ICD-10-CM

## 2022-10-22 DIAGNOSIS — N898 Other specified noninflammatory disorders of vagina: Secondary | ICD-10-CM

## 2022-10-22 DIAGNOSIS — F5081 Binge eating disorder: Secondary | ICD-10-CM

## 2022-10-22 DIAGNOSIS — I10 Essential (primary) hypertension: Secondary | ICD-10-CM | POA: Diagnosis not present

## 2022-10-22 DIAGNOSIS — F4310 Post-traumatic stress disorder, unspecified: Secondary | ICD-10-CM

## 2022-10-22 DIAGNOSIS — R69 Illness, unspecified: Secondary | ICD-10-CM | POA: Diagnosis not present

## 2022-10-22 MED ORDER — SEMAGLUTIDE (1 MG/DOSE) 4 MG/3ML ~~LOC~~ SOPN: 1.0000 mg | PEN_INJECTOR | SUBCUTANEOUS | 1 refills | Status: DC

## 2022-10-22 MED ORDER — BLOOD GLUCOSE METER KIT: PACK | 0 refills | Status: DC

## 2022-10-22 MED ORDER — BLOOD PRESSURE KIT: PACK | 0 refills | Status: DC

## 2022-10-22 NOTE — Assessment & Plan Note (Signed)
Growth on right upper arm for over 5 years, inc in size, refer surgery

## 2022-10-22 NOTE — Patient Instructions (Signed)
F/U in 7 weeks, call ifg you need me sooner  Increase in ozempic dose  Nurse please order large BP cuff and blood sugar test supplies for once daily testing  You are referred to Surgery and Gyne  Please get covid vaccine  It is important that you exercise regularly at least 30 minutes 5 times a week. If you develop chest pain, have severe difficulty breathing, or feel very tired, stop exercising immediately and seek medical attention    Thanks for choosing [facility], we consider it a privelige to serve you.

## 2022-10-22 NOTE — Assessment & Plan Note (Signed)
Bleeding intravaginal lesion for over 10 years, gyne re eval

## 2022-10-25 ENCOUNTER — Ambulatory Visit: Payer: 59 | Admitting: Dietician

## 2022-10-27 ENCOUNTER — Encounter: Payer: Self-pay | Admitting: Family Medicine

## 2022-10-27 ENCOUNTER — Telehealth (HOSPITAL_COMMUNITY): Payer: 59 | Admitting: Psychiatry

## 2022-10-28 ENCOUNTER — Encounter: Payer: Self-pay | Admitting: Family Medicine

## 2022-10-28 MED ORDER — TIRZEPATIDE 5 MG/0.5ML ~~LOC~~ SOAJ: 5.0000 mg | SUBCUTANEOUS | 0 refills | Status: DC

## 2022-10-28 NOTE — Assessment & Plan Note (Signed)
Controlled, no change in medication DASH diet and commitment to daily physical activity for a minimum of 30 minutes discussed and encouraged, as a part of hypertension management. The importance of attaining a healthy weight is also discussed.     10/22/2022   10:16 AM 10/22/2022    9:31 AM 10/22/2022    9:28 AM 09/20/2022    9:44 AM 09/10/2022    9:30 AM 09/10/2022    9:06 AM 09/10/2022    9:02 AM  BP/Weight  Systolic BP 838 706 582  608 883 584  Diastolic BP 86 88 89  82 78 83  Wt. (Lbs)   315.12 317.6   319.08  BMI   49.35 kg/m2 49.74 kg/m2   49.97 kg/m2

## 2022-10-28 NOTE — Assessment & Plan Note (Signed)
  Patient re-educated about  the importance of commitment to a  minimum of 150 minutes of exercise per week as able.  The importance of healthy food choices with portion control discussed, as well as eating regularly and within a 12 hour window most days. The need to choose "clean , green" food 50 to 75% of the time is discussed, as well as to make water the primary drink and set a goal of 64 ounces water daily.       10/22/2022    9:28 AM 09/20/2022    9:44 AM 09/10/2022    9:02 AM  Weight /BMI  Weight 315 lb 1.9 oz 317 lb 9.6 oz 319 lb 1.3 oz  Height '5\' 7"'$  (1.702 m) '5\' 7"'$  (1.702 m) '5\' 7"'$  (1.702 m)  BMI 49.35 kg/m2 49.74 kg/m2 49.97 kg/m2    Change to mounjaro implemented post visit, reported ozempic not availale

## 2022-10-28 NOTE — Assessment & Plan Note (Addendum)
unControlled,not suicidal or homicidal, no interest in therapy currently

## 2022-10-28 NOTE — Progress Notes (Signed)
ANALEYAH AISPURO     MRN: [id number]      DOB: [birth year]   HPI Ms. Suski is here for follow up and re-evaluation of chronic medical conditions, medication management and review of any available recent lab and radiology data.  Preventive health is updated, specifically  Cancer screening and Immunization.   Questions or concerns regarding consultations or procedures which the PT has had in the interim are  addressed. The PT denies any adverse reactions to current medications since the last visit.  Wants to resume mounjaro feels that she had better results with this C/o mass on right arm increasing in size over 5 years, requests removal C/o recurrent vaginal cyst with superinfection states in the past Gyne referred her to sub specialist but financially was unable  ROS Denies recent fever or chills. Denies sinus pressure, nasal congestion, ear pain or sore throat. Denies chest congestion, productive cough or wheezing. Denies chest pains, palpitations and leg swelling Denies abdominal pain, nausea, vomiting,diarrhea or constipation.   Denies dysuria, frequency, hesitancy or incontinence. Chronic uncontrolled  joint pain, swelling and limitation in mobility. Denies headaches, seizures, numbness, or tingling. C/o depression, not suicidal or homicidal, overwhelmed with life, c/o anxiety or insomnia.  PE  BP 120/86   Pulse 94   Ht 5\' 7"  (1.702 m)   Wt (!) 315 lb 1.9 oz (142.9 kg)   SpO2 93%   BMI 49.35 kg/m   Patient alert and oriented and in no cardiopulmonary distress.  HEENT: No facial asymmetry, EOMI,     Neck supple .  Chest: Clear to auscultation bilaterally.  CVS: S1, S2 no murmurs, no S3.Regular rate.  ABD: Soft non tender.   Ext: No edema  MS: decreased  ROM spine, shoulders, hips and knees.  Skin: Intact, no ulcerations or rash noted.Right arm mass/lipoma  Psych: Good eye contact, flat l affect. Memory intact not anxious but  depressed appearing.  CNS: CN 2-12  intact, power,  normal throughout.no focal deficits noted.   Assessment & Plan  Mass of arm, right Growth on right upper arm for over 5 years, inc in size, refer surgery  Vaginal lesion Bleeding intravaginal lesion for over 10 years, gyne re eval  Essential hypertension Controlled, no change in medication DASH diet and commitment to daily physical activity for a minimum of 30 minutes discussed and encouraged, as a part of hypertension management. The importance of attaining a healthy weight is also discussed.     10/22/2022   10:16 AM 10/22/2022    9:31 AM 10/22/2022    9:28 AM 09/20/2022    9:44 AM 09/10/2022    9:30 AM 09/10/2022    9:06 AM 09/10/2022    9:02 AM  BP/Weight  Systolic BP 120 138 148  134 148 150  Diastolic BP 86 88 89  82 78 83  Wt. (Lbs)   315.12 317.6   319.08  BMI   49.35 kg/m2 49.74 kg/m2   49.97 kg/m2       Morbid obesity (HCC)  Patient re-educated about  the importance of commitment to a  minimum of 150 minutes of exercise per week as able.  The importance of healthy food choices with portion control discussed, as well as eating regularly and within a 12 hour window most days. The need to choose "clean , green" food 50 to 75% of the time is discussed, as well as to make water the primary drink and set a goal of 64 ounces water daily.  10/22/2022    9:28 AM 09/20/2022    9:44 AM 09/10/2022    9:02 AM  Weight /BMI  Weight 315 lb 1.9 oz 317 lb 9.6 oz 319 lb 1.3 oz  Height 5\' 7"  (1.702 m) 5\' 7"  (1.702 m) 5\' 7"  (1.702 m)  BMI 49.35 kg/m2 49.74 kg/m2 49.97 kg/m2    Change to mounjaro implemented post visit, reported ozempic not availale  Severe episode of recurrent major depressive disorder, without psychotic features (HCC) unControlled,not suicidal or homicidal, no interest in therapy currently

## 2022-10-29 ENCOUNTER — Other Ambulatory Visit (HOSPITAL_COMMUNITY)
Admission: RE | Admit: 2022-10-29 | Discharge: 2022-10-29 | Disposition: A | Discharge status: Home / Self Care | Payer: 59 | Source: Ambulatory Visit | Attending: Adult Health | Admitting: Adult Health

## 2022-10-29 ENCOUNTER — Other Ambulatory Visit: Payer: Self-pay | Admitting: Family Medicine

## 2022-10-29 ENCOUNTER — Ambulatory Visit (INDEPENDENT_AMBULATORY_CARE_PROVIDER_SITE_OTHER): Payer: 59 | Admitting: Adult Health

## 2022-10-29 ENCOUNTER — Encounter: Payer: Self-pay | Admitting: Adult Health

## 2022-10-29 VITALS — BP 129/90 | HR 93 | Ht 67.0 in | Wt 315.0 lb

## 2022-10-29 DIAGNOSIS — K603 Anal fistula: Secondary | ICD-10-CM | POA: Insufficient documentation

## 2022-10-29 DIAGNOSIS — L905 Scar conditions and fibrosis of skin: Secondary | ICD-10-CM | POA: Diagnosis not present

## 2022-10-29 DIAGNOSIS — L089 Local infection of the skin and subcutaneous tissue, unspecified: Secondary | ICD-10-CM | POA: Diagnosis not present

## 2022-10-29 NOTE — Progress Notes (Addendum)
Subjective:     Patient ID: Jennifer [name], female   DOB: 1976/07/18, 46 y.o.   MRN: [id number]  HPI Jennifer [name] is a 46 year old black female, single, G1P1 in complaining of ?cyst, has had for over 12 years, was told could be fistula and needed to be removed, but she says she did not have the money then. It is draining brown to pink and times now. She has seen GI and had CT and ?perianal fistula.  Last pap was negative HPV and malignancy 08/27/20.  PCP is Dr Lodema Hong  Review of Systems ?cyst/fistula that drains Denies IBD or crohn's, has IBS  Reviewed past medical,surgical, social and family history. Reviewed medications and allergies.     Objective:   Physical Exam BP (!) 129/90 (BP Location: Right Arm, Patient Position: Sitting, Cuff Size: Normal)   Pulse 93   Ht 5\' 7"  (1.702 m)   Wt (!) 315 lb (142.9 kg)   BMI 49.34 kg/m     Skin warm and dry.Pelvic: external genitalia is normal in appearance, has 2 cm pink pink tissue with area of induration, left inner buttock, did not feel track in vagina or rectum and Dr Charlotta Newton in and was given verbal permission by pt to biopsy area, with tissue to pathology, and monsels applied to stop small amount of bleeding.  Fall risk is low  Upstream - 10/29/22 1211       Pregnancy Intention Screening   Does the patient want to become pregnant in the next year? No    Does the patient's partner want to become pregnant in the next year? No    Would the patient like to discuss contraceptive options today? No      Contraception Wrap Up   Current Method Oral Contraceptive    End Method Oral Contraceptive              Examination chaperoned by Faith Rogue LPN Assessment:     1. Perianal fistula Has been present for over 12 years She says it drains, keep log if feels any air come out of it or stool Biopsy sent to pathology to rule out cancer and determine diagnosis     Plan:     Follow up in 1 week with Dr Charlotta Newton to go over pathology results

## 2022-11-03 ENCOUNTER — Ambulatory Visit (INDEPENDENT_AMBULATORY_CARE_PROVIDER_SITE_OTHER): Payer: 59 | Admitting: Clinical

## 2022-11-03 DIAGNOSIS — F332 Major depressive disorder, recurrent severe without psychotic features: Secondary | ICD-10-CM | POA: Diagnosis not present

## 2022-11-03 DIAGNOSIS — R69 Illness, unspecified: Secondary | ICD-10-CM | POA: Diagnosis not present

## 2022-11-03 DIAGNOSIS — F411 Generalized anxiety disorder: Secondary | ICD-10-CM

## 2022-11-03 DIAGNOSIS — F410 Panic disorder [episodic paroxysmal anxiety] without agoraphobia: Secondary | ICD-10-CM | POA: Diagnosis not present

## 2022-11-03 LAB — SURGICAL PATHOLOGY

## 2022-11-03 NOTE — Progress Notes (Signed)
Virtual Visit via Video Note  I connected with Jennifer [name] on 11/03/22 at  9:00 AM EST by a video enabled telemedicine application and verified that I am speaking with the correct person using two identifiers.  Location: Patient: Home Provider: Office   I discussed the limitations of evaluation and management by telemedicine and the availability of in person appointments. The patient expressed understanding and agreed to proceed.   THERAPIST PROGRESS NOTE   Session Time: 9:00 AM-:9:30 AM   Participation Level: Active   Behavioral Response: CasualAlertDepressed   Type of Therapy: Individual Therapy   Treatment Goals addressed: Coping   Interventions: CBT, DBT, Solution Focused, Strength-based and Supportive   Summary: Jennifer [name] is a 46 y.o. female who presents with Depression/ Anxiety. The OPT therapist worked with the patient for her ongoing OPT treatment session. The OPT therapist utilized Motivational Interviewing to assist in creating therapeutic repore.The patient in the session was engaged and work in collaboration giving feedback about her triggers and symptoms over the past few weeks.The patient spoke about her work to get to a safe weight to have a weight reduction surgery and the financial stress of getting the $1,600.The OPT therapist utilized Cognitive Behavioral Therapy through cognitive restructuring as well as worked with the patient on coping strategies to assist in management of mental health symptoms. The patient verbalized, "My goal right now is to get the money I need for the weight loss surgery". The patient spoke about the impact of being under the weather and this impacting her mood and pain level.  The patient spoke about her schedule and routine changing with her children being out of school for the Christmas holiday break. The OPT therapist placed emphasis on the patient keeping all upcoming health appointments and adhering to ongoing health treatment  recommendations. The OPT therapist reviewed with the patient all of her upcoming appointments as listed in the patients MyChart. The patient spoke about plans for the upcoming Christmas holidays.   Suicidal/Homicidal: Nowithout intent/plan   Therapist Response:The OPT therapist worked with the patient for the patients scheduled session. The patient was engaged in her session and gave feedback in relation to triggers, symptoms, and behavior responses over the past few weeks. The patient is currently under the weather and will be reviewing her cold like symptoms with her PCP if this does not soon improve. The OPT therapist worked with the patient utilizing an in session Cognitive Behavioral Therapy exercise. The patient spoke about her adjustment with recent med therapy changes  and noting this has been helpful. The patient spoke about being active in the home within her limits. The patient spoke about the upcoming holidays and plans to spend them at home with her family and will not be traveling. The patient spoke about her goal of getting back into a place where she can get back to working and get out of her home more and create income, but feels currently with her health .  The patient spoke about having a upcoming court case for disability on jan 11th 2024.The OPT therapist will continue treatment work with the patient in her next scheduled session.   Plan: Return again in 2/3 weeks.   Diagnosis:      Axis I:Depression/Anxiety                           Axis II: No diagnosis     Collaboration of Care: Overview of patient involvement with  the med management program with prescriber psychiatrist Dr. Modesta Messing   Patient/Guardian was advised Release of Information must be obtained prior to any record release in order to collaborate their care with an outside provider. Patient/Guardian was advised if they have not already done so to contact the registration department to sign all necessary forms in order for  [place] to release information regarding their care.    Consent: Patient/Guardian gives verbal consent for treatment and assignment of benefits for services provided during this visit. Patient/Guardian expressed understanding and agreed to proceed.      I discussed the assessment and treatment plan with the patient. The patient was provided an opportunity to ask questions and all were answered. The patient agreed with the plan and demonstrated an understanding of the instructions.   The patient was advised to call back or seek an in-person evaluation if the symptoms worsen or if the condition fails to improve as anticipated.   I provided 30 minutes of non-face-to-face time during this encounter.   Maye Hides, LCSW   11/03/2022

## 2022-11-04 ENCOUNTER — Other Ambulatory Visit: Payer: Self-pay | Admitting: Family Medicine

## 2022-11-05 ENCOUNTER — Ambulatory Visit (INDEPENDENT_AMBULATORY_CARE_PROVIDER_SITE_OTHER): Payer: 59 | Admitting: Obstetrics & Gynecology

## 2022-11-05 ENCOUNTER — Encounter: Payer: Self-pay | Admitting: Obstetrics & Gynecology

## 2022-11-05 VITALS — BP 138/93 | HR 87 | Ht 67.0 in | Wt 315.0 lb

## 2022-11-05 DIAGNOSIS — K603 Anal fistula: Secondary | ICD-10-CM | POA: Diagnosis not present

## 2022-11-05 DIAGNOSIS — L988 Other specified disorders of the skin and subcutaneous tissue: Secondary | ICD-10-CM | POA: Diagnosis not present

## 2022-11-05 NOTE — Progress Notes (Signed)
GYN VISIT Patient name: Jennifer [name] MRN [id number]  Date of birth: 04-06-1976 Chief Complaint:   discuss pathology results  History of Present Illness:   Jennifer [name] is a 46 y.o. G63P1001 female being seen today for follow up regarding her prior visit.  To review, pt seen on 12/15 by Roseanne Reno who noted an irregular area at her left gluteal region.  Biopsy completed that was negative.  Initially, pt was not sure about prior diagnosis; however, upon further review of her records, including MRI it looks as though a perianal fistula had been seen.  When asked specific questions, she does note odor as well as a brown to bloody discharge from that area- daily.  On occasion will note pain when she sits.   Pt seen by GI- colonoscopy completed in 10/2021  No LMP recorded. (Menstrual status: Other).     10/22/2022    9:29 AM 09/10/2022    9:03 AM 08/31/2022    9:21 AM 07/21/2022    9:55 AM 07/08/2022    4:32 PM  Depression screen PHQ 2/9  Decreased Interest 1 3   2   Down, Depressed, Hopeless 2 3   2   PHQ - 2 Score 3 6   4   Altered sleeping 2 3   2   Tired, decreased energy 2 3   2   Change in appetite 1 0   2  Feeling bad or failure about yourself  2 3   2   Trouble concentrating 1 1   1   Moving slowly or fidgety/restless 0 3   1  Suicidal thoughts 1 0   0  PHQ-9 Score 12 19   14   Difficult doing work/chores Very difficult    Somewhat difficult     Information is confidential and restricted. Go to Review Flowsheets to unlock data.     Review of Systems:   Pertinent items are noted in HPI Denies fever/chills, dizziness, headaches, visual disturbances, fatigue, shortness of breath, chest pain, abdominal pain, vomiting. Pertinent History Reviewed:  Reviewed past medical,surgical, social, obstetrical and family history.  Reviewed problem list, medications and allergies. Physical Assessment:   Vitals:   11/05/22 1129 11/05/22 1132  BP: (!) 142/86 (!) 138/93  Pulse: 88 87   Weight: (!) 315 lb (142.9 kg)   Height: 5\' 7"  (1.702 m)   Body mass index is 49.34 kg/m.       Physical Examination:   General appearance: alert, well appearing, and in no distress  Psych: mood appropriate, normal affect  Skin: warm & dry   Cardiovascular: normal heart rate noted  Respiratory: normal respiratory effort, no distress  Extremities: 1+ edema  Chaperone: N/A    Assessment & Plan:  1) Perianal fistula -reviewed diagnosis and discussed management likely to through general surgery specifically colorectal surgeon -referral created   Orders Placed This Encounter  Procedures   Ambulatory referral to General Surgery    No follow-ups on file.   Myna Hidalgo, DO Attending Obstetrician & Gynecologist, [facility] for [employer], [facility]

## 2022-11-09 ENCOUNTER — Other Ambulatory Visit (HOSPITAL_COMMUNITY): Payer: Self-pay | Admitting: Psychiatry

## 2022-11-09 ENCOUNTER — Other Ambulatory Visit: Payer: Self-pay | Admitting: Family Medicine

## 2022-11-09 DIAGNOSIS — F5081 Binge eating disorder: Secondary | ICD-10-CM

## 2022-11-09 DIAGNOSIS — F411 Generalized anxiety disorder: Secondary | ICD-10-CM

## 2022-11-09 DIAGNOSIS — F4310 Post-traumatic stress disorder, unspecified: Secondary | ICD-10-CM

## 2022-11-09 DIAGNOSIS — F332 Major depressive disorder, recurrent severe without psychotic features: Secondary | ICD-10-CM

## 2022-11-10 ENCOUNTER — Telehealth: Payer: Self-pay | Admitting: Family Medicine

## 2022-11-10 NOTE — Telephone Encounter (Signed)
Disability forms  Noted   Copied   Sleeved   In brown folder up front  Pt appt 11/16/22

## 2022-11-11 ENCOUNTER — Encounter: Payer: Self-pay | Admitting: General Surgery

## 2022-11-11 ENCOUNTER — Ambulatory Visit (INDEPENDENT_AMBULATORY_CARE_PROVIDER_SITE_OTHER): Payer: 59 | Admitting: General Surgery

## 2022-11-11 VITALS — BP 135/88 | HR 88 | Temp 97.8°F | Resp 14 | Ht 67.0 in | Wt 318.0 lb

## 2022-11-11 DIAGNOSIS — L918 Other hypertrophic disorders of the skin: Secondary | ICD-10-CM | POA: Diagnosis not present

## 2022-11-11 NOTE — Progress Notes (Signed)
Jennifer [name]; [id number]; 1976-04-17   HPI Patient is a 46 year old black female who was referred to my care by Dr. Syliva Overman for evaluation and treatment of a skin tag on the posterior aspect of her right arm.  Is been present for some time but is causing her discomfort.  She would like it removed. Past Medical History:  Diagnosis Date   Abnormal vaginal Pap smear    Acid reflux    Alopecia    Anxiety    Arthritis    Cough 09/13/2022   Depression    Depression, major, single episode, severe (HCC) 06/18/2019   pHQ 9 score of 18 in 05/2019, not suicidal or homicidal  PHQ 9 score of 13 in 03/2021  Score of 21 in 11/2021, score of 7 in 04/2022, score of 20 in 06/2022   Diabetes mellitus without complication (HCC)    prediabetic, takes no meds   Diverticulosis    Family history of diabetes mellitus    GERD (gastroesophageal reflux disease)    Hemorrhoids    History of recurrent UTIs    Hypertension    Obesity     Past Surgical History:  Procedure Laterality Date   APPENDECTOMY     COLONOSCOPY N/A 03/13/2014   Dr. Jena Gauss- grade 3 hemorrhoids, colonic diverticulosis bx= benign lymphoid polyp   COLONOSCOPY WITH PROPOFOL N/A 01/26/2021   Procedure: COLONOSCOPY WITH PROPOFOL;  Surgeon: Corbin Ade, MD;  Location: [facility];  Service: Endoscopy;  Laterality: N/A;  AM   KNEE ARTHROSCOPY     right   POLYPECTOMY  01/26/2021   Procedure: POLYPECTOMY;  Surgeon: Corbin Ade, MD;  Location: [facility];  Service: Endoscopy;;    Family History  Adopted: Yes  Problem Relation Age of Onset   Hypertension Mother    Diabetes Mother    Other Mother        cholangiocarcinoma, age 60, deceased   Breast cancer Maternal Aunt    Colon cancer Neg Hx    Tuberous sclerosis Neg Hx    Alpha-1 antitrypsin deficiency Neg Hx     Current Outpatient Medications on File Prior to Visit  Medication Sig Dispense Refill   amLODipine (NORVASC) 5 MG tablet TAKE 1 TABLET (5 MG TOTAL) BY  MOUTH DAILY. 90 tablet 1   clotrimazole-betamethasone (LOTRISONE) cream APPLY TO AFFECTED AREA TWICE A DAY 45 g 1   FLUoxetine (PROZAC) 20 MG capsule Take 3 capsules (60 mg total) by mouth daily. 90 capsule 1   fluticasone (FLONASE) 50 MCG/ACT nasal spray SPRAY 2 SPRAYS INTO EACH NOSTRIL EVERY DAY 48 mL 2   gabapentin (NEURONTIN) 300 MG capsule TAKE 1 CAPSULE BY MOUTH EVERYDAY AT BEDTIME 30 capsule 3   INCASSIA 0.35 MG tablet Take 1 tablet by mouth daily.     megestrol (MEGACE) 40 MG tablet TAKE 2 TABLETS BY MOUTH EVERY DAY 60 tablet 3   meloxicam (MOBIC) 15 MG tablet TAKE 1 TABLET (15 MG TOTAL) BY MOUTH DAILY. 30 tablet 2   oxybutynin (DITROPAN-XL) 10 MG 24 hr tablet TAKE 1 TABLET BY MOUTH EVERYDAY AT BEDTIME 90 tablet 1   triamterene-hydrochlorothiazide (MAXZIDE) 75-50 MG tablet TAKE 1 TABLET BY MOUTH EVERY DAY 90 tablet 3   Semaglutide,0.25 or 0.5MG /DOS, (OZEMPIC, 0.25 OR 0.5 MG/DOSE,) 2 MG/3ML SOPN INJECT 0.5 MG INTO THE SKIN ONCE A WEEK. (Patient not taking: Reported on 11/11/2022) 3 mL 1   No current facility-administered medications on file prior to visit.    No Known  Allergies  Social History   Substance and Sexual Activity  Alcohol Use No    Social History   Tobacco Use  Smoking Status Never  Smokeless Tobacco Never    Review of Systems  Constitutional:  Positive for malaise/fatigue.  HENT:  Positive for sinus pain.   Eyes:  Positive for blurred vision.  Respiratory:  Positive for shortness of breath.   Cardiovascular: Negative.   Gastrointestinal:  Positive for heartburn and nausea.  Genitourinary:  Positive for frequency.  Musculoskeletal:  Positive for back pain and joint pain.  Skin:  Negative for itching and rash.  Neurological:  Positive for dizziness.  Endo/Heme/Allergies: Negative.   Psychiatric/Behavioral: Negative.      Objective   Vitals:   11/11/22 0900  BP: 135/88  Pulse: 88  Resp: 14  Temp: 97.8 F (36.6 C)  SpO2: 92%    Physical  Exam Vitals reviewed.  Constitutional:      Appearance: Normal appearance. She is obese. She is not ill-appearing.  HENT:     Head: Normocephalic and atraumatic.  Cardiovascular:     Rate and Rhythm: Normal rate and regular rhythm.     Heart sounds: Normal heart sounds. No murmur heard.    No friction rub. No gallop.  Pulmonary:     Effort: Pulmonary effort is normal. No respiratory distress.     Breath sounds: Normal breath sounds. No stridor. No wheezing, rhonchi or rales.  Skin:    General: Skin is warm and dry.     Comments: 4 mm skin tag noted on posterior right arm.  Informed consent obtained from patient.  1% Xylocaine was used for local anesthesia.  The area was prepped with Betadine.  The skin tag was removed using cautery.  It contained a fat pad.  Triple antibiotic ointment and a Band-Aid applied.  Patient tolerated the procedure well.  Neurological:     Mental Status: She is alert and oriented to person, place, and time.     Assessment  Skin tag, right arm Plan  Keep the wound clean and apply antibiotic ointment until healed.  Follow-up here as needed.

## 2022-11-12 ENCOUNTER — Telehealth (INDEPENDENT_AMBULATORY_CARE_PROVIDER_SITE_OTHER): Payer: 59 | Admitting: Psychiatry

## 2022-11-12 DIAGNOSIS — F332 Major depressive disorder, recurrent severe without psychotic features: Secondary | ICD-10-CM | POA: Diagnosis not present

## 2022-11-12 DIAGNOSIS — G4733 Obstructive sleep apnea (adult) (pediatric): Secondary | ICD-10-CM

## 2022-11-12 DIAGNOSIS — F411 Generalized anxiety disorder: Secondary | ICD-10-CM | POA: Diagnosis not present

## 2022-11-12 DIAGNOSIS — F4310 Post-traumatic stress disorder, unspecified: Secondary | ICD-10-CM

## 2022-11-12 DIAGNOSIS — F5105 Insomnia due to other mental disorder: Secondary | ICD-10-CM

## 2022-11-12 DIAGNOSIS — K582 Mixed irritable bowel syndrome: Secondary | ICD-10-CM

## 2022-11-12 DIAGNOSIS — R5382 Chronic fatigue, unspecified: Secondary | ICD-10-CM | POA: Diagnosis not present

## 2022-11-12 DIAGNOSIS — G8929 Other chronic pain: Secondary | ICD-10-CM

## 2022-11-12 DIAGNOSIS — F99 Mental disorder, not otherwise specified: Secondary | ICD-10-CM | POA: Diagnosis not present

## 2022-11-12 DIAGNOSIS — F410 Panic disorder [episodic paroxysmal anxiety] without agoraphobia: Secondary | ICD-10-CM | POA: Diagnosis not present

## 2022-11-12 DIAGNOSIS — F5081 Binge eating disorder: Secondary | ICD-10-CM

## 2022-11-12 DIAGNOSIS — M5440 Lumbago with sciatica, unspecified side: Secondary | ICD-10-CM | POA: Diagnosis not present

## 2022-11-12 DIAGNOSIS — R69 Illness, unspecified: Secondary | ICD-10-CM | POA: Diagnosis not present

## 2022-11-12 DIAGNOSIS — F50819 Binge eating disorder, unspecified: Secondary | ICD-10-CM

## 2022-11-12 MED ORDER — LISDEXAMFETAMINE DIMESYLATE 20 MG PO CAPS: 20.0000 mg | ORAL_CAPSULE | Freq: Every day | ORAL | 0 refills | Status: DC
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
## 2022-11-16 ENCOUNTER — Encounter: Payer: Self-pay | Admitting: Family Medicine

## 2022-11-16 ENCOUNTER — Ambulatory Visit (INDEPENDENT_AMBULATORY_CARE_PROVIDER_SITE_OTHER): Payer: Commercial Managed Care - HMO | Admitting: Family Medicine

## 2022-11-16 ENCOUNTER — Other Ambulatory Visit: Payer: Self-pay | Admitting: Family Medicine

## 2022-11-16 VITALS — BP 130/92 | HR 98 | Ht 67.0 in | Wt 315.1 lb

## 2022-11-16 DIAGNOSIS — G8929 Other chronic pain: Secondary | ICD-10-CM

## 2022-11-16 DIAGNOSIS — F332 Major depressive disorder, recurrent severe without psychotic features: Secondary | ICD-10-CM

## 2022-11-16 DIAGNOSIS — Z0279 Encounter for issue of other medical certificate: Secondary | ICD-10-CM

## 2022-11-16 DIAGNOSIS — E118 Type 2 diabetes mellitus with unspecified complications: Secondary | ICD-10-CM

## 2022-11-16 DIAGNOSIS — F410 Panic disorder [episodic paroxysmal anxiety] without agoraphobia: Secondary | ICD-10-CM

## 2022-11-16 DIAGNOSIS — I10 Essential (primary) hypertension: Secondary | ICD-10-CM | POA: Diagnosis not present

## 2022-11-16 DIAGNOSIS — F411 Generalized anxiety disorder: Secondary | ICD-10-CM

## 2022-11-16 DIAGNOSIS — M5440 Lumbago with sciatica, unspecified side: Secondary | ICD-10-CM

## 2022-11-16 DIAGNOSIS — Z13220 Encounter for screening for lipoid disorders: Secondary | ICD-10-CM

## 2022-11-16 DIAGNOSIS — R2681 Unsteadiness on feet: Secondary | ICD-10-CM

## 2022-11-16 DIAGNOSIS — E559 Vitamin D deficiency, unspecified: Secondary | ICD-10-CM

## 2022-11-16 DIAGNOSIS — Z0271 Encounter for disability determination: Secondary | ICD-10-CM

## 2022-11-16 DIAGNOSIS — M170 Bilateral primary osteoarthritis of knee: Secondary | ICD-10-CM

## 2022-11-16 MED ORDER — OZEMPIC (2 MG/DOSE) 8 MG/3ML ~~LOC~~ SOPN: 2.0000 mg | PEN_INJECTOR | SUBCUTANEOUS | 2 refills | Status: DC

## 2022-11-16 MED ORDER — EXENATIDE 10 MCG/0.04ML ~~LOC~~ SOPN: 10.0000 ug | PEN_INJECTOR | Freq: Two times a day (BID) | SUBCUTANEOUS | 2 refills | Status: DC

## 2022-11-16 MED ORDER — AMLODIPINE BESYLATE 2.5 MG PO TABS: 2.5000 mg | ORAL_TABLET | Freq: Every day | ORAL | 3 refills | Status: DC

## 2022-11-16 MED ORDER — EXENATIDE 5 MCG/0.02ML ~~LOC~~ SOPN: 5.0000 ug | PEN_INJECTOR | Freq: Two times a day (BID) | SUBCUTANEOUS | 0 refills | Status: DC

## 2022-11-16 NOTE — Assessment & Plan Note (Signed)
Uncontrolled, incapable of employment due to mental health issues currently, managed by Psych

## 2022-11-16 NOTE — Assessment & Plan Note (Signed)
Uncontrolled add amlodipine 2.5 mg daily DASH diet and commitment to daily physical activity for a minimum of 30 minutes discussed and encouraged, as a part of hypertension management. The importance of attaining a healthy weight is also discussed.     11/16/2022   10:37 AM 11/16/2022   10:35 AM 11/16/2022   10:27 AM 11/16/2022   10:25 AM 11/11/2022    9:00 AM 11/05/2022   11:32 AM 11/05/2022   11:29 AM  BP/Weight  Systolic BP 106 269 485 462 703 500 938  Diastolic BP 92 94 84 82 88 93 86  Wt. (Lbs)    315.12 318  315  BMI    49.35 kg/m2 49.81 kg/m2  49.34 kg/m2

## 2022-11-16 NOTE — Assessment & Plan Note (Signed)
  Patient re-educated about  the importance of commitment to a  minimum of 150 minutes of exercise per week as able.  The importance of healthy food choices with portion control discussed, as well as eating regularly and within a 12 hour window most days. The need to choose "clean , green" food 50 to 75% of the time is discussed, as well as to make water the primary drink and set a goal of 64 ounces water daily.       11/16/2022   10:25 AM 11/11/2022    9:00 AM 11/05/2022   11:29 AM  Weight /BMI  Weight 315 lb 1.9 oz 318 lb 315 lb  Height '5\' 7"'$  (1.702 m) '5\' 7"'$  (1.702 m) '5\' 7"'$  (1.702 m)  BMI 49.35 kg/m2 49.81 kg/m2 49.34 kg/m2

## 2022-11-16 NOTE — Assessment & Plan Note (Signed)
Form completion during exam based on history obtained and exam performed at visit  Mental health needs to be successfully treated, despite long h/o medication and also some therapy, currently experiencing severe depression and uncontroled GAD In my professional  opinion Jennifer [name] does quaify for long termd isability, may even qualify for permanent disability Needs to lose over 100 pounds, get relief from arthritic pain and likely knee surgery to ensure safe and pain free ambulation

## 2022-11-16 NOTE — Assessment & Plan Note (Signed)
Uncontrolled and debilitating , managed by Psych, not suicidal or homicidal, currently incapable of employment because of mental illness  inadequately treated

## 2022-11-16 NOTE — Patient Instructions (Addendum)
F/u in Feb as before, cal if you need to be seen sooner  Additional medication, amlodipine 2.5 mg one daily is added to your current regime, CONTINUE amlodpine 5 mg and triamterene one daily as before  Ozempic 2 mg weekly is prescribed for diabetes and weight management  You are referred to Orthopedics for pain management  cBC, cmp and eGFR, LIPId , tSH and vit D today  Disability form to be sent from office , you need to let staff know exactly where this is to be sent  Thanks for choosing [facility], we consider it a privelige to serve you. ]

## 2022-11-16 NOTE — Assessment & Plan Note (Signed)
Debilitating rated between 8 to 10 every day, limited standing , walking, squatting, stoobing and bending, unable t work [facility] for pain management , most recent Ortho advised weight loss , cane and return for surgery a significant amot of weight has been lost Will refer back to previous Orthio if they accept current ins for pain managemnt

## 2022-11-16 NOTE — Assessment & Plan Note (Signed)
Unsteady , and chronic fear of falling, uncontrolled pain and reports lower extremity weakness, refer Ortho , pain management at this time as needs to lose weight to b able to benefit from surgery

## 2022-11-16 NOTE — Assessment & Plan Note (Signed)
High fall risk, home and general safety discussed, needs to ambulate with a cane

## 2022-11-16 NOTE — Progress Notes (Signed)
Jennifer [name]     MRN: [id number]      DOB: 20-Jun-1976   HPI Ms. Fishell is here for follow up and re-evaluation of chronic medical conditions, medication management and review of any available recent lab and radiology data.  Preventive health is updated, specifically  Cancer screening and Immunization.   Pain in knees and back rated at 10 for past 6 months, reports instability , feels as though she will fall, thinks she fell lat year, can recall 2 separate incidents in the past approx 14 months C/o uncontrolled depression and anxiety, being treated by Psych, recent med change, has had suicidal thoughts in the past , denies attempted suicide now as she has 1 daughter C/o constant uncontrolled pain, debilitating, preventing safe mobility and ability to  support herself, unable to work , back an knee pain in particular  ROS: Denies recent fever or chills. Denies sinus pressure, nasal congestion, ear pain or sore throat. Denies chest congestion, productive cough or wheezing. Denies chest pains, palpitations and leg swelling Denies abdominal pain, nausea, vomiting,diarrhea or constipation.   Denies dysuria, frequency, hesitancy.  Denies skin break down or rash.   PE  BP (!) 130/94   Pulse (!) 103   Ht 5\' 7"  (1.702 m)   Wt (!) 315 lb 1.9 oz (142.9 kg)   SpO2 93%   BMI 49.35 kg/m   Patient alert and oriented and in no cardiopulmonary distress.  HEENT: No facial asymmetry, EOMI,     Neck supple .  Chest: Clear to auscultation bilaterally.  CVS: S1, S2 no murmurs, no S3.Regular rate.  ABD: Soft non tender.   Ext: No edema  MS: markedly decreased  ROM lumbar  spine,  and knees.Deformity , swelling and crepitus of both knes , left worse than right  Skin: Intact, no ulcerations or rash noted.  Psych: Fair eye contact, flat l affect. Memory mildly impaired  anxious oand depressed appearing.  CNS: CN 2-12 intact, grade 4 power in lower ext Assessment & Plan  Essential  hypertension Uncontrolled add amlodipine 2.5 mg daily DASH diet and commitment to daily physical activity for a minimum of 30 minutes discussed and encouraged, as a part of hypertension management. The importance of attaining a healthy weight is also discussed.     11/16/2022   10:37 AM 11/16/2022   10:35 AM 11/16/2022   10:27 AM 11/16/2022   10:25 AM 11/11/2022    9:00 AM 11/05/2022   11:32 AM 11/05/2022   11:29 AM  BP/Weight  Systolic BP 130 130 120 166 135 138 142  Diastolic BP 92 94 84 82 88 93 86  Wt. (Lbs)    315.12 318  315  BMI    49.35 kg/m2 49.81 kg/m2  49.34 kg/m2       Controlled diabetes mellitus type 2 with complications [facility]) Ms. Heitz is reminded of the importance of commitment to daily physical activity for 30 minutes or more, as able and the need to limit carbohydrate intake to 30 to 60 grams per meal to help with blood sugar control.   The need to take medication as prescribed, test blood sugar as directed, and to call between visits if there is a concern that blood sugar is uncontrolled is also discussed. Ozempic not covered,  byetta prescribed  Ms. Renteria is reminded of the importance of daily foot exam, annual eye examination, and good blood sugar, blood pressure and cholesterol control.     Latest Ref Rng & Units  09/10/2022    9:55 AM 08/17/2022    9:17 AM 07/02/2022   11:39 AM 01/28/2022    8:15 AM 12/03/2021   10:18 AM  Diabetic Labs  HbA1c 4.8 - 5.6 % 6.2   5.7   6.2   Micro/Creat Ratio 0 - 29 mg/g creat 28       Creatinine 0.57 - 1.00 mg/dL 1.61  0.96   0.45  4.09       11/16/2022   10:37 AM 11/16/2022   10:35 AM 11/16/2022   10:27 AM 11/16/2022   10:25 AM 11/11/2022    9:00 AM 11/05/2022   11:32 AM 11/05/2022   11:29 AM  BP/Weight  Systolic BP 130 130 120 166 135 138 142  Diastolic BP 92 94 84 82 88 93 86  Wt. (Lbs)    315.12 318  315  BMI    49.35 kg/m2 49.81 kg/m2  49.34 kg/m2       No data to display            Has been unable to get  mounjaro, wants to resume similar med as was benficial in weight loss  Severe episode of recurrent major depressive disorder, without psychotic features (HCC) Uncontrolled and debilitating , managed by Psych, not suicidal or homicidal, currently incapable of employment because of mental illness  inadequately treated  Morbid obesity (HCC)  Patient re-educated about  the importance of commitment to a  minimum of 150 minutes of exercise per week as able.  The importance of healthy food choices with portion control discussed, as well as eating regularly and within a 12 hour window most days. The need to choose "clean , green" food 50 to 75% of the time is discussed, as well as to make water the primary drink and set a goal of 64 ounces water daily.       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 management , most recent Ortho advised weight loss , cane and return for surgery a significant amot of weight has been lost Will refer back to previous Orthio if they accept current ins for pain managemnt  Unsteady gait High fall risk, home and general safety discussed, needs to ambulate with a cane  Osteoarthritis of both knees Unsteady , and chronic fear of falling, uncontrolled pain and reports lower extremity weakness, refer Ortho , pain management at this time as needs to lose weight to b able to benefit from surgery  Disability examination Form completion during exam based on history obtained and exam performed at visit   Mental health needs to be successfully treated, despite long h/o medication and also some therapy, currently experiencing severe depression and uncontroled GAD In my professional  opinion Ms Gismondi does quaify for long termd isability, may even qualify for permanent disability Needs to lose over 100 pounds, get relief from arthritic pain and likely knee surgery to ensure safe and pain free ambulation

## 2022-11-16 NOTE — Assessment & Plan Note (Addendum)
Jennifer [name] is reminded of the importance of commitment to daily physical activity for 30 minutes or more, as able and the need to limit carbohydrate intake to 30 to 60 grams per meal to help with blood sugar control.   The need to take medication as prescribed, test blood sugar as directed, and to call between visits if there is a concern that blood sugar is uncontrolled is also discussed. Ozempic not covered,  byetta prescribed  Jennifer [name] is reminded of the importance of daily foot exam, annual eye examination, and good blood sugar, blood pressure and cholesterol control.     Latest Ref Rng & Units 09/10/2022    9:55 AM 08/17/2022    9:17 AM 07/02/2022   11:39 AM 01/28/2022    8:15 AM 12/03/2021   10:18 AM  Diabetic Labs  HbA1c 4.8 - 5.6 % 6.2   5.7   6.2   Micro/Creat Ratio 0 - 29 mg/g creat 28       Creatinine 0.57 - 1.00 mg/dL 1.04  1.01   1.37  1.20       11/16/2022   10:37 AM 11/16/2022   10:35 AM 11/16/2022   10:27 AM 11/16/2022   10:25 AM 11/11/2022    9:00 AM 11/05/2022   11:32 AM 11/05/2022   11:29 AM  BP/Weight  Systolic BP 903 009 233 007 622 633 354  Diastolic BP 92 94 84 82 88 93 86  Wt. (Lbs)    315.12 318  315  BMI    49.35 kg/m2 49.81 kg/m2  49.34 kg/m2       No data to display            Has been unable to get mounjaro, wants to resume similar med as was benficial in weight loss

## 2022-11-17 ENCOUNTER — Other Ambulatory Visit (HOSPITAL_COMMUNITY): Payer: Self-pay | Admitting: Psychiatry

## 2022-11-17 DIAGNOSIS — F5081 Binge eating disorder: Secondary | ICD-10-CM

## 2022-11-17 DIAGNOSIS — F50819 Binge eating disorder, unspecified: Secondary | ICD-10-CM

## 2022-11-17 DIAGNOSIS — F410 Panic disorder [episodic paroxysmal anxiety] without agoraphobia: Secondary | ICD-10-CM

## 2022-11-17 DIAGNOSIS — F4310 Post-traumatic stress disorder, unspecified: Secondary | ICD-10-CM

## 2022-11-17 DIAGNOSIS — F332 Major depressive disorder, recurrent severe without psychotic features: Secondary | ICD-10-CM

## 2022-11-17 LAB — LIPID PANEL

## 2022-11-18 LAB — TSH: TSH: 1.83 u[IU]/mL (ref 0.450–4.500)

## 2022-11-18 LAB — LIPID PANEL
Chol/HDL Ratio: 3 ratio (ref 0.0–4.4)
Cholesterol, Total: 162 mg/dL (ref 100–199)
HDL: 54 mg/dL (ref >39)
LDL Chol Calc (NIH): 96 mg/dL (ref 0–99)
Triglycerides: 63 mg/dL (ref 0–149)
VLDL Cholesterol Cal: 12 mg/dL (ref 5–40)

## 2022-11-18 LAB — CMP14+EGFR
ALT: 15 IU/L (ref 0–32)
AST: 12 IU/L (ref 0–40)
Albumin/Globulin Ratio: 1.7 (ref 1.2–2.2)
Albumin: 4.7 g/dL (ref 3.9–4.9)
Alkaline Phosphatase: 103 IU/L (ref 44–121)
BUN/Creatinine Ratio: 15 (ref 9–23)
BUN: 16 mg/dL (ref 6–24)
Bilirubin Total: 0.3 mg/dL (ref 0.0–1.2)
CO2: 18 mmol/L — ABNORMAL LOW (ref 20–29)
Calcium: 9.9 mg/dL (ref 8.7–10.2)
Chloride: 100 mmol/L (ref 96–106)
Creatinine, Ser: 1.04 mg/dL — ABNORMAL HIGH (ref 0.57–1.00)
Globulin, Total: 2.8 g/dL (ref 1.5–4.5)
Glucose: 93 mg/dL (ref 70–99)
Potassium: 4 mmol/L (ref 3.5–5.2)
Sodium: 141 mmol/L (ref 134–144)
Total Protein: 7.5 g/dL (ref 6.0–8.5)
eGFR: 66 mL/min/{1.73_m2} (ref >59)

## 2022-11-18 LAB — VITAMIN D 25 HYDROXY (VIT D DEFICIENCY, FRACTURES): Vit D, 25-Hydroxy: 37.8 ng/mL (ref 30.0–100.0)

## 2022-11-18 LAB — CBC
Hematocrit: 42 % (ref 34.0–46.6)
Hemoglobin: 14 g/dL (ref 11.1–15.9)
MCH: 30.5 pg (ref 26.6–33.0)
MCHC: 33.3 g/dL (ref 31.5–35.7)
MCV: 92 fL (ref 79–97)
Platelets: 396 10*3/uL (ref 150–450)
RBC: 4.59 x10E6/uL (ref 3.77–5.28)
RDW: 13.3 % (ref 11.7–15.4)
WBC: 8.3 10*3/uL (ref 3.4–10.8)

## 2022-11-24 ENCOUNTER — Encounter: Payer: Self-pay | Admitting: Family Medicine

## 2022-11-30 ENCOUNTER — Ambulatory Visit (HOSPITAL_COMMUNITY): Payer: 59 | Admitting: Clinical

## 2022-12-08 ENCOUNTER — Other Ambulatory Visit: Payer: Self-pay | Admitting: Family Medicine

## 2022-12-08 ENCOUNTER — Other Ambulatory Visit (HOSPITAL_COMMUNITY): Payer: Self-pay | Admitting: Psychiatry

## 2022-12-08 DIAGNOSIS — F5081 Binge eating disorder: Secondary | ICD-10-CM

## 2022-12-08 DIAGNOSIS — F4310 Post-traumatic stress disorder, unspecified: Secondary | ICD-10-CM

## 2022-12-08 DIAGNOSIS — F332 Major depressive disorder, recurrent severe without psychotic features: Secondary | ICD-10-CM

## 2022-12-08 DIAGNOSIS — F410 Panic disorder [episodic paroxysmal anxiety] without agoraphobia: Secondary | ICD-10-CM

## 2022-12-11 ENCOUNTER — Other Ambulatory Visit (HOSPITAL_COMMUNITY): Payer: Self-pay | Admitting: Psychiatry

## 2022-12-11 DIAGNOSIS — F4310 Post-traumatic stress disorder, unspecified: Secondary | ICD-10-CM

## 2022-12-11 DIAGNOSIS — F5081 Binge eating disorder: Secondary | ICD-10-CM

## 2022-12-11 DIAGNOSIS — F410 Panic disorder [episodic paroxysmal anxiety] without agoraphobia: Secondary | ICD-10-CM

## 2022-12-11 DIAGNOSIS — F332 Major depressive disorder, recurrent severe without psychotic features: Secondary | ICD-10-CM

## 2022-12-14 ENCOUNTER — Encounter (HOSPITAL_COMMUNITY): Payer: Self-pay | Admitting: Psychiatry

## 2022-12-14 ENCOUNTER — Telehealth (INDEPENDENT_AMBULATORY_CARE_PROVIDER_SITE_OTHER): Payer: Commercial Managed Care - HMO | Admitting: Psychiatry

## 2022-12-14 DIAGNOSIS — F4310 Post-traumatic stress disorder, unspecified: Secondary | ICD-10-CM

## 2022-12-14 DIAGNOSIS — R5382 Chronic fatigue, unspecified: Secondary | ICD-10-CM

## 2022-12-14 DIAGNOSIS — F5081 Binge eating disorder: Secondary | ICD-10-CM

## 2022-12-14 DIAGNOSIS — F332 Major depressive disorder, recurrent severe without psychotic features: Secondary | ICD-10-CM | POA: Diagnosis not present

## 2022-12-14 DIAGNOSIS — F99 Mental disorder, not otherwise specified: Secondary | ICD-10-CM

## 2022-12-14 DIAGNOSIS — F50819 Binge eating disorder, unspecified: Secondary | ICD-10-CM

## 2022-12-14 DIAGNOSIS — R0683 Snoring: Secondary | ICD-10-CM

## 2022-12-14 DIAGNOSIS — F5105 Insomnia due to other mental disorder: Secondary | ICD-10-CM

## 2022-12-14 DIAGNOSIS — F410 Panic disorder [episodic paroxysmal anxiety] without agoraphobia: Secondary | ICD-10-CM

## 2022-12-14 DIAGNOSIS — F411 Generalized anxiety disorder: Secondary | ICD-10-CM

## 2022-12-14 MED ORDER — LISDEXAMFETAMINE DIMESYLATE 20 MG PO CAPS: 20.0000 mg | ORAL_CAPSULE | Freq: Every day | ORAL | 0 refills | Status: DC

## 2022-12-14 NOTE — Patient Instructions (Signed)
I sent the Vyvanse 20 mg once daily to your pharmacy again.  Please let me know if they do not have this for you as it should be helpful to give you a little bit of an energy boost but I am primarily using it to help with the binge eating.  If the suicidal thoughts should worsen as well please let me know or call [phone].  If you can please reach out to your insurer to see about your CPAP machine.
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
## 2022-12-17 ENCOUNTER — Ambulatory Visit: Payer: 59 | Admitting: Family Medicine

## 2022-12-23 ENCOUNTER — Encounter: Payer: Self-pay | Admitting: Family Medicine

## 2022-12-23 ENCOUNTER — Other Ambulatory Visit (HOSPITAL_COMMUNITY): Payer: Self-pay | Admitting: Family Medicine

## 2022-12-23 ENCOUNTER — Ambulatory Visit (INDEPENDENT_AMBULATORY_CARE_PROVIDER_SITE_OTHER): Payer: Commercial Managed Care - HMO | Admitting: Family Medicine

## 2022-12-23 VITALS — BP 120/84 | HR 106 | Ht 67.0 in | Wt 316.1 lb

## 2022-12-23 DIAGNOSIS — I10 Essential (primary) hypertension: Secondary | ICD-10-CM | POA: Diagnosis not present

## 2022-12-23 DIAGNOSIS — F410 Panic disorder [episodic paroxysmal anxiety] without agoraphobia: Secondary | ICD-10-CM

## 2022-12-23 DIAGNOSIS — F411 Generalized anxiety disorder: Secondary | ICD-10-CM

## 2022-12-23 DIAGNOSIS — R921 Mammographic calcification found on diagnostic imaging of breast: Secondary | ICD-10-CM

## 2022-12-23 DIAGNOSIS — F332 Major depressive disorder, recurrent severe without psychotic features: Secondary | ICD-10-CM

## 2022-12-23 DIAGNOSIS — E118 Type 2 diabetes mellitus with unspecified complications: Secondary | ICD-10-CM | POA: Diagnosis not present

## 2022-12-23 MED ORDER — BD PEN NEEDLE SHORT U/F 31G X 8 MM MISC: 5 refills | Status: DC

## 2022-12-23 NOTE — Patient Instructions (Addendum)
F/U in 7 to 8 weeks, Call if you need me sooner.  HBA1C today   Bilateral diagnostic mammogram already ordered was due in October, please schedule at checkout  Start Byetta 5 mcg once daily for 3 to 5 days then increase to the standard dose of twice daily.  Nurse please call in needles for patient to administer her medication twice daily as  prescribed.  Good that depression is improving  Food choice is crucial for weight loss and eating on a schedule  Thanks for choosing  Primary Care, we consider it a privelige to serve you.

## 2022-12-23 NOTE — Progress Notes (Signed)
Jennifer [name]     MRN: [id number]      DOB: 03/05/1976   HPI Jennifer [name] is here for follow up and re-evaluation of chronic medical conditions, medication management and review of any available recent lab and radiology data.  Preventive health is updated, specifically  Cancer screening and Immunization.   Medications are being adjusted/ change by Psych and feels thee may be some improvement, financial challenges continue to pile up on her and this makes it very difficult to see her way out, not suicidal or homicidal Chronic uncontrolled pain is challenging also Has been approved for byetta for diabetes management and will titrate to standard dose over a 3 day period, had done very well on mounjaro as far as weight loss is concerned  ROS Denies recent fever or chills. Denies sinus pressure, nasal congestion, ear pain or sore throat. Denies chest congestion, productive cough or wheezing. Denies chest pains, palpitations and leg swelling Denies abdominal pain, nausea, vomiting,diarrhea or constipation.   Denies dysuria, frequency, hesitancy or incontinence. Chronic  joint pain, swelling and limitation in mobility. Denies headaches, seizures,  Chronic  depression and anxiety being treated by psych. Denies skin break down or rash.   PE  BP 120/84   Pulse (!) 106   Ht 5\' 7"  (1.702 m)   Wt (!) 316 lb 1.9 oz (143.4 kg)   SpO2 94%   BMI 49.51 kg/m   Patient alert and oriented and in no cardiopulmonary distress.  HEENT: No facial asymmetry, EOMI,     Neck supple .  Chest: Clear to auscultation bilaterally.  CVS: S1, S2 no murmurs, no S3.Regular rate.  ABD: Soft non tender.   Ext: No edema  MS: decreased  ROM spine, shoulders, hips and knees.  Skin: Intact, no ulcerations or rash noted.  Psych: Good eye contact, normal affect. Memory intact not anxious mildly  depressed appearing.  CNS: CN 2-12 intact, power,  normal throughout.no focal deficits noted.   Assessment &  Plan  Essential hypertension Controlled, no change in medication DASH diet and commitment to daily physical activity for a minimum of 30 minutes discussed and encouraged, as a part of hypertension management. The importance of attaining a healthy weight is also discussed.     12/23/2022    9:40 AM 12/23/2022    9:06 AM 12/23/2022    9:04 AM 11/16/2022   10:37 AM 11/16/2022   10:35 AM 11/16/2022   10:27 AM 11/16/2022   10:25 AM  BP/Weight  Systolic BP 120 135 140 130 130 120 166  Diastolic BP 84 81 85 92 94 84 82  Wt. (Lbs)   316.12    315.12  BMI   49.51 kg/m2    49.35 kg/m2       Controlled diabetes mellitus type 2 with complications [facility]) Jennifer [name] is reminded of the importance of commitment to daily physical activity for 30 minutes or more, as able and the need to limit carbohydrate intake to 30 to 60 grams per meal to help with blood sugar control.   The need to take medication as prescribed, test blood sugar as directed, and to call between visits if there is a concern that blood sugar is uncontrolled is also discussed.   Jennifer [name] is reminded of the importance of daily foot exam, annual eye examination, and good blood sugar, blood pressure and cholesterol control.     Latest Ref Rng & Units 12/23/2022    9:56 AM 11/16/2022  11:28 AM 09/10/2022    9:55 AM 08/17/2022    9:17 AM 07/02/2022   11:39 AM  Diabetic Labs  HbA1c 4.8 - 5.6 % 6.2   6.2   5.7   Micro/Creat Ratio 0 - 29 mg/g creat   28     Chol 100 - 199 mg/dL  161      HDL >09 mg/dL  54      Calc LDL 0 - 99 mg/dL  96      Triglycerides 0 - 149 mg/dL  63      Creatinine 6.04 - 1.00 mg/dL  5.40  9.81  1.91        12/23/2022    9:40 AM 12/23/2022    9:06 AM 12/23/2022    9:04 AM 11/16/2022   10:37 AM 11/16/2022   10:35 AM 11/16/2022   10:27 AM 11/16/2022   10:25 AM  BP/Weight  Systolic BP 120 135 140 130 130 120 166  Diastolic BP 84 81 85 92 94 84 82  Wt. (Lbs)   316.12    315.12  BMI   49.51 kg/m2    49.35 kg/m2       No  data to display            Needs to change to byetta and counseled as to to how to titrate dose over short period of time, has concerns about possible s/e  Morbid obesity [facility])  Patient re-educated about  the importance of commitment to a  minimum of 150 minutes of exercise per week as able.  The importance of healthy food choices with portion control discussed, as well as eating regularly and within a 12 hour window most days. The need to choose "clean , green" food 50 to 75% of the time is discussed, as well as to make water the primary drink and set a goal of 64 ounces water daily.       12/23/2022    9:04 AM 11/16/2022   10:25 AM 11/11/2022    9:00 AM  Weight /BMI  Weight 316 lb 1.9 oz 315 lb 1.9 oz 318 lb  Height 5\' 7"  (1.702 m) 5\' 7"  (1.702 m) 5\' 7"  (1.702 m)  BMI 49.51 kg/m2 49.35 kg/m2 49.81 kg/m2      Generalized anxiety disorder with panic attacks Improving with psych and herapy, not yet at goal  Severe episode of recurrent major depressive disorder, without psychotic features (HCC) Not controlled but working with psych and therapist, not suicidal or homicidal

## 2022-12-24 ENCOUNTER — Ambulatory Visit (INDEPENDENT_AMBULATORY_CARE_PROVIDER_SITE_OTHER): Payer: Medicaid Other | Admitting: Clinical

## 2022-12-24 DIAGNOSIS — F410 Panic disorder [episodic paroxysmal anxiety] without agoraphobia: Secondary | ICD-10-CM

## 2022-12-24 DIAGNOSIS — F411 Generalized anxiety disorder: Secondary | ICD-10-CM | POA: Diagnosis not present

## 2022-12-24 DIAGNOSIS — F332 Major depressive disorder, recurrent severe without psychotic features: Secondary | ICD-10-CM | POA: Diagnosis not present

## 2022-12-24 LAB — HEMOGLOBIN A1C
Est. average glucose Bld gHb Est-mCnc: 131 mg/dL
Hgb A1c MFr Bld: 6.2 % — ABNORMAL HIGH (ref 4.8–5.6)

## 2022-12-24 NOTE — Progress Notes (Signed)
Virtual Visit via Video Note   I connected with Jennifer [name] on 12/24/22 at  9:00 AM EST by a video enabled telemedicine application and verified that I am speaking with the correct person using two identifiers.   Location: Patient: Home Provider: Office   I discussed the limitations of evaluation and management by telemedicine and the availability of in person appointments. The patient expressed understanding and agreed to proceed.     THERAPIST PROGRESS NOTE   Session Time: 9:00 AM-:9:30 AM   Participation Level: Active   Behavioral Response: CasualAlertDepressed   Type of Therapy: Individual Therapy   Treatment Goals addressed: Coping   Interventions: CBT, DBT, Solution Focused, Strength-based and Supportive   Summary: Jennifer [name] is a 47 y.o. female who presents with Depression/ Anxiety. The OPT therapist worked with the patient for her ongoing OPT treatment session. The OPT therapist utilized Motivational Interviewing to assist in creating therapeutic repore.The patient in the session was engaged and work in collaboration giving feedback about her triggers and symptoms over the past few weeks. The patient noted she had her disability hearing and is still awaiting the results of the hearing. The patient spoke about her work to get to a safe weight to have a weight reduction surgery and the financial stress of getting the $1,600. The patient spoke about her recent visit with her psychiatrist and starting a new medication.The OPT therapist utilized Cognitive Behavioral Therapy through cognitive restructuring as well as worked with the patient on coping strategies to assist in management of mental health symptoms. The patient verbalized, "I have noticed having more good mood days since starting new medication". The patient spoke about meeting yesterday with her PCP and continuing her work with medication. The OPT therapist placed emphasis on the patient keeping all upcoming health  appointments and adhering to ongoing health treatment recommendations.  The patient notes she is not currently working with pain management or doing physical therapy. The OPT therapist reviewed with the patient all of her upcoming appointments as listed in the patients MyChart.    Suicidal/Homicidal: Nowithout intent/plan   Therapist Response:The OPT therapist worked with the patient for the patients scheduled session. The patient was engaged in her session and gave feedback in relation to triggers, symptoms, and behavior responses over the past few weeks. The patient is currently awaiting the outcome of her disability hearing from January. The patient spoke about her adjustment with recent med therapy changes  and noting this has been helpful. The patient spoke about being active in the home within her limits. The patient spoke about the upcoming holidays and plans to spend them at home with her family and will not be traveling. The patient spoke about her goal of getting back into a place where she can get back to working and get out of her home more and create income, but feels currently with her health . The patient spoke about planning to watch the Super Bowl over the upcoming weekend with family. The OPT therapist will continue treatment work with the patient in her next scheduled session.   Plan: Return again in 2/3 weeks.   Diagnosis:      Axis I:Depression/Anxiety                           Axis II: No diagnosis     Collaboration of Care: Overview of patient involvement with the med management program with prescriber psychiatrist Dr. Modesta Messing  Patient/Guardian was advised Release of Information must be obtained prior to any record release in order to collaborate their care with an outside provider. Patient/Guardian was advised if they have not already done so to contact the registration department to sign all necessary forms in order for [place] to release information regarding their care.     Consent: Patient/Guardian gives verbal consent for treatment and assignment of benefits for services provided during this visit. Patient/Guardian expressed understanding and agreed to proceed.      I discussed the assessment and treatment plan with the patient. The patient was provided an opportunity to ask questions and all were answered. The patient agreed with the plan and demonstrated an understanding of the instructions.   The patient was advised to call back or seek an in-person evaluation if the symptoms worsen or if the condition fails to improve as anticipated.   I provided 30 minutes of non-face-to-face time during this encounter.   Maye Hides, LCSW   12/24/2022

## 2022-12-27 ENCOUNTER — Encounter: Payer: Self-pay | Admitting: Family Medicine

## 2022-12-27 NOTE — Assessment & Plan Note (Signed)
Improving with psych and herapy, not yet at goal

## 2022-12-27 NOTE — Assessment & Plan Note (Signed)
Not controlled but working with psych and therapist, not suicidal or homicidal

## 2022-12-27 NOTE — Assessment & Plan Note (Signed)
  Patient re-educated about  the importance of commitment to a  minimum of 150 minutes of exercise per week as able.  The importance of healthy food choices with portion control discussed, as well as eating regularly and within a 12 hour window most days. The need to choose "clean , green" food 50 to 75% of the time is discussed, as well as to make water the primary drink and set a goal of 64 ounces water daily.       12/23/2022    9:04 AM 11/16/2022   10:25 AM 11/11/2022    9:00 AM  Weight /BMI  Weight 316 lb 1.9 oz 315 lb 1.9 oz 318 lb  Height '5\' 7"'$  (1.702 m) '5\' 7"'$  (1.702 m) '5\' 7"'$  (1.702 m)  BMI 49.51 kg/m2 49.35 kg/m2 49.81 kg/m2

## 2022-12-27 NOTE — Assessment & Plan Note (Signed)
Controlled, no change in medication DASH diet and commitment to daily physical activity for a minimum of 30 minutes discussed and encouraged, as a part of hypertension management. The importance of attaining a healthy weight is also discussed.     12/23/2022    9:40 AM 12/23/2022    9:06 AM 12/23/2022    9:04 AM 11/16/2022   10:37 AM 11/16/2022   10:35 AM 11/16/2022   10:27 AM 11/16/2022   10:25 AM  BP/Weight  Systolic BP [id number]  Diastolic BP 84 81 85 92 94 84 82  Wt. (Lbs)   316.12    315.12  BMI   49.51 kg/m2    49.35 kg/m2

## 2022-12-27 NOTE — Assessment & Plan Note (Signed)
Jennifer [name] is reminded of the importance of commitment to daily physical activity for 30 minutes or more, as able and the need to limit carbohydrate intake to 30 to 60 grams per meal to help with blood sugar control.   The need to take medication as prescribed, test blood sugar as directed, and to call between visits if there is a concern that blood sugar is uncontrolled is also discussed.   Jennifer [name] is reminded of the importance of daily foot exam, annual eye examination, and good blood sugar, blood pressure and cholesterol control.     Latest Ref Rng & Units 12/23/2022    9:56 AM 11/16/2022   11:28 AM 09/10/2022    9:55 AM 08/17/2022    9:17 AM 07/02/2022   11:39 AM  Diabetic Labs  HbA1c 4.8 - 5.6 % 6.2   6.2   5.7   Micro/Creat Ratio 0 - 29 mg/g creat   28     Chol 100 - 199 mg/dL  162      HDL >39 mg/dL  54      Calc LDL 0 - 99 mg/dL  96      Triglycerides 0 - 149 mg/dL  63      Creatinine 0.57 - 1.00 mg/dL  1.04  1.04  1.01        12/23/2022    9:40 AM 12/23/2022    9:06 AM 12/23/2022    9:04 AM 11/16/2022   10:37 AM 11/16/2022   10:35 AM 11/16/2022   10:27 AM 11/16/2022   10:25 AM  BP/Weight  Systolic BP [id number]  Diastolic BP 84 81 85 92 94 84 82  Wt. (Lbs)   316.12    315.12  BMI   49.51 kg/m2    49.35 kg/m2       No data to display            Needs to change to byetta and counseled as to to how to titrate dose over short period of time, has concerns about possible s/e

## 2023-01-03 ENCOUNTER — Encounter: Payer: Self-pay | Admitting: Family Medicine

## 2023-01-03 ENCOUNTER — Telehealth (INDEPENDENT_AMBULATORY_CARE_PROVIDER_SITE_OTHER): Payer: Commercial Managed Care - HMO | Admitting: Family Medicine

## 2023-01-03 DIAGNOSIS — U071 COVID-19: Secondary | ICD-10-CM

## 2023-01-03 MED ORDER — LEVOCETIRIZINE DIHYDROCHLORIDE 5 MG PO TABS: 5.0000 mg | ORAL_TABLET | Freq: Every evening | ORAL | 1 refills | Status: DC

## 2023-01-03 MED ORDER — BENZONATATE 100 MG PO CAPS: 100.0000 mg | ORAL_CAPSULE | Freq: Two times a day (BID) | ORAL | 0 refills | Status: DC | PRN

## 2023-01-03 MED ORDER — NIRMATRELVIR/RITONAVIR (PAXLOVID)TABLET: 3.0000 | ORAL_TABLET | Freq: Two times a day (BID) | ORAL | 0 refills | Status: AC

## 2023-01-03 NOTE — Progress Notes (Unsigned)
Virtual Visit via Video Note  I connected with Jennifer [name] on 01/04/23 at  4:20 PM EST by a video enabled telemedicine application and verified that I am speaking with the correct person using two identifiers.  Patient Location: Home Provider Location: Office/Clinic  I discussed the limitations, risks, security, and privacy concerns of performing an evaluation and management service by video and the availability of in person appointments. I also discussed with the patient that there may be a patient responsible charge related to this service. The patient expressed understanding and agreed to proceed.  Subjective: PCP: [name], MD  Chief Complaint  Patient presents with   Covid Positive    Pt reports positive for covid took an at home covid test 01/03/23. Sx of sneezing, coughing, headache, congestion, and body aches.    HPI Jennifer [name] 47 year old female, presents via telehealth video with a positive at home Covid-19  test on 01/03/23. Patient reports fatigue, fever, chills, headache, non productive cough, sneezing. Patient has taken OTC medication include tylenol and Robitussin with mild relief. Patient denies chest pain, shortness of breath, nausea, abdominal pain, dizziness. Patient reports not feeling anxious or nervous.  ROS: Per HPI  Current Outpatient Medications:    amLODipine (NORVASC) 2.5 MG tablet, Take 1 tablet (2.5 mg total) by mouth daily., Disp: 30 tablet, Rfl: 3   amLODipine (NORVASC) 5 MG tablet, TAKE 1 TABLET (5 MG TOTAL) BY MOUTH DAILY., Disp: 90 tablet, Rfl: 1   benzonatate (TESSALON) 100 MG capsule, Take 1 capsule (100 mg total) by mouth 2 (two) times daily as needed for cough., Disp: 20 capsule, Rfl: 0   clotrimazole-betamethasone (LOTRISONE) cream, APPLY TO AFFECTED AREA TWICE A DAY, Disp: 45 g, Rfl: 1   FLUoxetine (PROZAC) 20 MG capsule, TAKE 3 CAPSULES BY MOUTH EVERY DAY, Disp: 270 capsule, Rfl: 1   fluticasone (FLONASE) 50 MCG/ACT nasal spray,  SPRAY 2 SPRAYS INTO EACH NOSTRIL EVERY DAY, Disp: 48 mL, Rfl: 2   gabapentin (NEURONTIN) 300 MG capsule, TAKE 1 CAPSULE BY MOUTH EVERYDAY AT BEDTIME, Disp: 30 capsule, Rfl: 3   glucose blood (ACCU-CHEK GUIDE) test strip, USE TO CHECK SUGAR UP TO 4 TIMES DAILY AS DIRECTED E10.9 E11.9, Disp: 100 strip, Rfl: 1   INCASSIA 0.35 MG tablet, Take 1 tablet by mouth daily., Disp: , Rfl:    Insulin Pen Needle (B-D ULTRAFINE III SHORT PEN) 31G X 8 MM MISC, Use with insulin twice daily., Disp: 100 each, Rfl: 5   levocetirizine (XYZAL ALLERGY 24HR) 5 MG tablet, Take 1 tablet (5 mg total) by mouth every evening., Disp: 15 tablet, Rfl: 1   megestrol (MEGACE) 40 MG tablet, TAKE 2 TABLETS BY MOUTH EVERY DAY, Disp: 60 tablet, Rfl: 3   meloxicam (MOBIC) 15 MG tablet, TAKE 1 TABLET (15 MG TOTAL) BY MOUTH DAILY., Disp: 30 tablet, Rfl: 2   nirmatrelvir/ritonavir (PAXLOVID) 20 x 150 MG & 10 x 100MG TABS, Take 3 tablets by mouth 2 (two) times daily for 5 days. (Take nirmatrelvir 150 mg two tablets twice daily for 5 days and ritonavir 100 mg one tablet twice daily for 5 days) Patient GFR is 66, Disp: 30 tablet, Rfl: 0   oxybutynin (DITROPAN-XL) 10 MG 24 hr tablet, TAKE 1 TABLET BY MOUTH EVERYDAY AT BEDTIME, Disp: 90 tablet, Rfl: 1   triamterene-hydrochlorothiazide (MAXZIDE) 75-50 MG tablet, TAKE 1 TABLET BY MOUTH EVERY DAY, Disp: 90 tablet, Rfl: 3   exenatide (BYETTA) 5 MCG/0.02ML SOPN injection, Inject 5 mcg  into the skin 2 (two) times daily with a meal. (Patient not taking: Reported on 12/23/2022), Disp: 1.2 mL, Rfl: 0   lisdexamfetamine (VYVANSE) 30 MG capsule, Take 1 capsule (30 mg total) by mouth daily., Disp: 30 capsule, Rfl: 0  Observations/Objective: There were no vitals filed for this visit. Physical Exam Patient is alert and orientated x 3  Assessment and Plan: COVID-19 Assessment & Plan: Prescribed Paxlovid patient GFR 66, tessalon 100 mg, xyzal 79m Advise patient symptomatic treatment, rest, increase oral  fluid intake. Take OTC medication for fever. Follow-up for worsening or persistent symptoms. Patient verbalizes understanding regarding plan of care and all questions answered   Orders: -     nirmatrelvir/ritonavir; Take 3 tablets by mouth 2 (two) times daily for 5 days. (Take nirmatrelvir 150 mg two tablets twice daily for 5 days and ritonavir 100 mg one tablet twice daily for 5 days) Patient GFR is 66  Dispense: 30 tablet; Refill: 0 -     Benzonatate; Take 1 capsule (100 mg total) by mouth 2 (two) times daily as needed for cough.  Dispense: 20 capsule; Refill: 0 -     Levocetirizine Dihydrochloride; Take 1 tablet (5 mg total) by mouth every evening.  Dispense: 15 tablet; Refill: 1    Follow Up Instructions: No follow-ups on file.   I discussed the assessment and treatment plan with the patient. The patient was provided an opportunity to ask questions, and all were answered. The patient agreed with the plan and demonstrated an understanding of the instructions.   The patient was advised to call back or seek an in-person evaluation if the symptoms worsen or if the condition fails to improve as anticipated.  The above assessment and management plan was discussed with the patient. The patient verbalized understanding of and has agreed to the management plan.   IRenard HamperORia Comment FNP

## 2023-01-04 ENCOUNTER — Telehealth (INDEPENDENT_AMBULATORY_CARE_PROVIDER_SITE_OTHER): Payer: Commercial Managed Care - HMO | Admitting: Psychiatry

## 2023-01-04 ENCOUNTER — Encounter (HOSPITAL_COMMUNITY): Payer: Self-pay | Admitting: Psychiatry

## 2023-01-04 DIAGNOSIS — K582 Mixed irritable bowel syndrome: Secondary | ICD-10-CM

## 2023-01-04 DIAGNOSIS — F410 Panic disorder [episodic paroxysmal anxiety] without agoraphobia: Secondary | ICD-10-CM

## 2023-01-04 DIAGNOSIS — U071 COVID-19: Secondary | ICD-10-CM | POA: Insufficient documentation

## 2023-01-04 DIAGNOSIS — F332 Major depressive disorder, recurrent severe without psychotic features: Secondary | ICD-10-CM

## 2023-01-04 DIAGNOSIS — R5382 Chronic fatigue, unspecified: Secondary | ICD-10-CM

## 2023-01-04 DIAGNOSIS — F5105 Insomnia due to other mental disorder: Secondary | ICD-10-CM | POA: Diagnosis not present

## 2023-01-04 DIAGNOSIS — F5081 Binge eating disorder: Secondary | ICD-10-CM | POA: Diagnosis not present

## 2023-01-04 DIAGNOSIS — F411 Generalized anxiety disorder: Secondary | ICD-10-CM

## 2023-01-04 DIAGNOSIS — F4310 Post-traumatic stress disorder, unspecified: Secondary | ICD-10-CM

## 2023-01-04 DIAGNOSIS — G4733 Obstructive sleep apnea (adult) (pediatric): Secondary | ICD-10-CM

## 2023-01-04 DIAGNOSIS — F99 Mental disorder, not otherwise specified: Secondary | ICD-10-CM

## 2023-01-04 MED ORDER — LISDEXAMFETAMINE DIMESYLATE 30 MG PO CAPS: 30.0000 mg | ORAL_CAPSULE | Freq: Every day | ORAL | 0 refills | Status: DC
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
## 2023-01-04 NOTE — Patient Instructions (Signed)
We increased the Vyvanse to 30 mg once daily.  This should help further with the fatigue and binge eating that you have been having.  Once you are feeling better from the [place], continuing to be up and moving around should be helpful.

## 2023-01-04 NOTE — Assessment & Plan Note (Signed)
Prescribed Paxlovid patient GFR 66, tessalon 100 mg, xyzal 18m Advise patient symptomatic treatment, rest, increase oral fluid intake. Take OTC medication for fever. Follow-up for worsening or persistent symptoms. Patient verbalizes understanding regarding plan of care and all questions answered

## 2023-01-07 ENCOUNTER — Other Ambulatory Visit: Payer: Self-pay | Admitting: Family Medicine

## 2023-01-07 ENCOUNTER — Other Ambulatory Visit (HOSPITAL_COMMUNITY): Payer: Self-pay | Admitting: Psychiatry

## 2023-01-07 DIAGNOSIS — F410 Panic disorder [episodic paroxysmal anxiety] without agoraphobia: Secondary | ICD-10-CM

## 2023-01-11 ENCOUNTER — Other Ambulatory Visit: Payer: Self-pay | Admitting: Family Medicine

## 2023-01-11 DIAGNOSIS — U071 COVID-19: Secondary | ICD-10-CM

## 2023-01-13 ENCOUNTER — Ambulatory Visit (HOSPITAL_COMMUNITY)
Admission: RE | Admit: 2023-01-13 | Discharge: 2023-01-13 | Disposition: A | Discharge status: Home / Self Care | Payer: Commercial Managed Care - HMO | Source: Ambulatory Visit | Attending: Family Medicine | Admitting: Family Medicine

## 2023-01-13 ENCOUNTER — Other Ambulatory Visit (HOSPITAL_COMMUNITY): Payer: Self-pay | Admitting: Family Medicine

## 2023-01-13 ENCOUNTER — Encounter (HOSPITAL_COMMUNITY): Payer: Self-pay

## 2023-01-13 ENCOUNTER — Encounter: Payer: Self-pay | Admitting: Radiology

## 2023-01-13 DIAGNOSIS — N6489 Other specified disorders of breast: Secondary | ICD-10-CM | POA: Diagnosis not present

## 2023-01-13 DIAGNOSIS — R921 Mammographic calcification found on diagnostic imaging of breast: Secondary | ICD-10-CM

## 2023-01-13 DIAGNOSIS — R928 Other abnormal and inconclusive findings on diagnostic imaging of breast: Secondary | ICD-10-CM | POA: Diagnosis not present

## 2023-01-21 ENCOUNTER — Ambulatory Visit (HOSPITAL_COMMUNITY): Payer: Medicaid Other | Admitting: Clinical

## 2023-01-21 ENCOUNTER — Telehealth (HOSPITAL_COMMUNITY): Payer: Self-pay | Admitting: Clinical

## 2023-01-21 NOTE — Telephone Encounter (Signed)
The patient did respond to contact attempts but noted she sould not complete session due to being at a family members home.

## 2023-01-24 ENCOUNTER — Other Ambulatory Visit: Payer: Self-pay | Admitting: Body Imaging

## 2023-01-24 ENCOUNTER — Ambulatory Visit
Admission: RE | Admit: 2023-01-24 | Discharge: 2023-01-24 | Disposition: A | Discharge status: Home / Self Care | Payer: Commercial Managed Care - HMO | Source: Ambulatory Visit | Attending: Family Medicine | Admitting: Family Medicine

## 2023-01-24 DIAGNOSIS — R928 Other abnormal and inconclusive findings on diagnostic imaging of breast: Secondary | ICD-10-CM

## 2023-01-24 HISTORY — PX: BREAST BIOPSY: SHX20

## 2023-01-25 ENCOUNTER — Telehealth: Payer: Self-pay | Admitting: Family Medicine

## 2023-01-25 NOTE — Telephone Encounter (Signed)
Jennifer [name] called from [facility] for breast biopsy and she hung up before nurse got to the phone caller ID # showed [phone]

## 2023-01-25 NOTE — Telephone Encounter (Signed)
She actually needed to speak with the radiologist who did the biopsy. Will contact him

## 2023-02-01 ENCOUNTER — Other Ambulatory Visit: Payer: Self-pay | Admitting: Family Medicine

## 2023-02-08 ENCOUNTER — Other Ambulatory Visit (HOSPITAL_COMMUNITY): Payer: Self-pay | Admitting: Psychiatry

## 2023-02-08 DIAGNOSIS — F4310 Post-traumatic stress disorder, unspecified: Secondary | ICD-10-CM

## 2023-02-08 DIAGNOSIS — F411 Generalized anxiety disorder: Secondary | ICD-10-CM

## 2023-02-08 DIAGNOSIS — F5081 Binge eating disorder: Secondary | ICD-10-CM

## 2023-02-08 DIAGNOSIS — F332 Major depressive disorder, recurrent severe without psychotic features: Secondary | ICD-10-CM

## 2023-02-08 NOTE — Telephone Encounter (Signed)
Spoke with pt scheduled 02/10/23

## 2023-02-09 ENCOUNTER — Other Ambulatory Visit: Payer: Self-pay | Admitting: Family Medicine

## 2023-02-10 ENCOUNTER — Telehealth (INDEPENDENT_AMBULATORY_CARE_PROVIDER_SITE_OTHER): Payer: Commercial Managed Care - HMO | Admitting: Psychiatry

## 2023-02-10 ENCOUNTER — Encounter (HOSPITAL_COMMUNITY): Payer: Self-pay | Admitting: Psychiatry

## 2023-02-10 DIAGNOSIS — R5382 Chronic fatigue, unspecified: Secondary | ICD-10-CM

## 2023-02-10 DIAGNOSIS — F332 Major depressive disorder, recurrent severe without psychotic features: Secondary | ICD-10-CM | POA: Diagnosis not present

## 2023-02-10 DIAGNOSIS — R0683 Snoring: Secondary | ICD-10-CM

## 2023-02-10 DIAGNOSIS — F5081 Binge eating disorder: Secondary | ICD-10-CM

## 2023-02-10 DIAGNOSIS — F411 Generalized anxiety disorder: Secondary | ICD-10-CM

## 2023-02-10 DIAGNOSIS — F410 Panic disorder [episodic paroxysmal anxiety] without agoraphobia: Secondary | ICD-10-CM

## 2023-02-10 DIAGNOSIS — F99 Mental disorder, not otherwise specified: Secondary | ICD-10-CM

## 2023-02-10 DIAGNOSIS — F5105 Insomnia due to other mental disorder: Secondary | ICD-10-CM | POA: Diagnosis not present

## 2023-02-10 DIAGNOSIS — F4310 Post-traumatic stress disorder, unspecified: Secondary | ICD-10-CM

## 2023-02-10 MED ORDER — LISDEXAMFETAMINE DIMESYLATE 30 MG PO CAPS: 30.0000 mg | ORAL_CAPSULE | Freq: Every day | ORAL | 0 refills | Status: DC
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
## 2023-02-10 NOTE — Patient Instructions (Signed)
No medication changes today. Keep finding ways to stay active and be consistent with your meals; your brain and body will function better with consistent nutrition.

## 2023-02-16 ENCOUNTER — Encounter: Payer: Self-pay | Admitting: Family Medicine

## 2023-02-16 ENCOUNTER — Ambulatory Visit (INDEPENDENT_AMBULATORY_CARE_PROVIDER_SITE_OTHER): Payer: Commercial Managed Care - HMO | Admitting: Family Medicine

## 2023-02-16 VITALS — BP 124/82 | HR 101 | Resp 16 | Ht 67.0 in | Wt 311.0 lb

## 2023-02-16 DIAGNOSIS — M1990 Unspecified osteoarthritis, unspecified site: Secondary | ICD-10-CM

## 2023-02-16 DIAGNOSIS — E118 Type 2 diabetes mellitus with unspecified complications: Secondary | ICD-10-CM | POA: Diagnosis not present

## 2023-02-16 DIAGNOSIS — I10 Essential (primary) hypertension: Secondary | ICD-10-CM | POA: Diagnosis not present

## 2023-02-16 DIAGNOSIS — F332 Major depressive disorder, recurrent severe without psychotic features: Secondary | ICD-10-CM

## 2023-02-16 MED ORDER — EXENATIDE 10 MCG/0.04ML ~~LOC~~ SOPN: 10.0000 ug | PEN_INJECTOR | Freq: Two times a day (BID) | SUBCUTANEOUS | 3 refills | Status: DC

## 2023-02-16 NOTE — Patient Instructions (Signed)
F/u mid June  , call if you need me sooner  Non fasting HBA1C , chem 7 and eGFR  the morning of your next visit  Thankful about your disability  Congrats on weight loss  New higher dose of exenetide when next filling   Nurse please  see how / if she can be referred to water aerobics at [facility] been in the past year   Thanks for choosing [facility], we consider it a privelige to serve you.

## 2023-02-20 ENCOUNTER — Encounter: Payer: Self-pay | Admitting: Family Medicine

## 2023-02-20 NOTE — Assessment & Plan Note (Signed)
Controlled, no change in medication DASH diet and commitment to daily physical activity for a minimum of 30 minutes discussed and encouraged, as a part of hypertension management. The importance of attaining a healthy weight is also discussed.     02/16/2023   11:04 AM 02/16/2023   10:05 AM 12/23/2022    9:40 AM 12/23/2022    9:06 AM 12/23/2022    9:04 AM 11/16/2022   10:37 AM 11/16/2022   10:35 AM  BP/Weight  Systolic BP 124 138 120 135 140 130 130  Diastolic BP 82 88 84 81 85 92 94  Wt. (Lbs)  311   316.12    BMI  48.71 kg/m2   49.51 kg/m2

## 2023-02-20 NOTE — Assessment & Plan Note (Signed)
Disabling , benefits from [employer] wil try to get her back into class

## 2023-02-20 NOTE — Assessment & Plan Note (Signed)
Improved,  dose inc in med  Patient re-educated about  the importance of commitment to a  minimum of 150 minutes of exercise per week as able.  The importance of healthy food choices with portion control discussed, as well as eating regularly and within a 12 hour window most days. The need to choose "clean , green" food 50 to 75% of the time is discussed, as well as to make water the primary drink and set a goal of 64 ounces water daily.       02/16/2023   10:05 AM 12/23/2022    9:04 AM 11/16/2022   10:25 AM  Weight /BMI  Weight 311 lb 316 lb 1.9 oz 315 lb 1.9 oz  Height 5\' 7"  (1.702 m) 5\' 7"  (1.702 m) 5\' 7"  (1.702 m)  BMI 48.71 kg/m2 49.51 kg/m2 49.35 kg/m2

## 2023-02-20 NOTE — Assessment & Plan Note (Signed)
Jennifer [name] is reminded of the importance of commitment to daily physical activity for 30 minutes or more, as able and the need to limit carbohydrate intake to 30 to 60 grams per meal to help with blood sugar control.   The need to take medication as prescribed, test blood sugar as directed, and to call between visits if there is a concern that blood sugar is uncontrolled is also discussed.   Jennifer [name] is reminded of the importance of daily foot exam, annual eye examination, and good blood sugar, blood pressure and cholesterol control. Updated lab needed at/ before next visit.      Latest Ref Rng & Units 12/23/2022    9:56 AM 11/16/2022   11:28 AM 09/10/2022    9:55 AM 08/17/2022    9:17 AM 07/02/2022   11:39 AM  Diabetic Labs  HbA1c 4.8 - 5.6 % 6.2   6.2   5.7   Micro/Creat Ratio 0 - 29 mg/g creat   28     Chol 100 - 199 mg/dL  818      HDL >29 mg/dL  54      Calc LDL 0 - 99 mg/dL  96      Triglycerides 0 - 149 mg/dL  63      Creatinine 9.37 - 1.00 mg/dL  1.69  6.78  9.38        02/16/2023   11:04 AM 02/16/2023   10:05 AM 12/23/2022    9:40 AM 12/23/2022    9:06 AM 12/23/2022    9:04 AM 11/16/2022   10:37 AM 11/16/2022   10:35 AM  BP/Weight  Systolic BP 124 138 120 135 140 130 130  Diastolic BP 82 88 84 81 85 92 94  Wt. (Lbs)  311   316.12    BMI  48.71 kg/m2   49.51 kg/m2         No data to display

## 2023-02-20 NOTE — Assessment & Plan Note (Signed)
I'm[proved with new medications, therapy and also recently obtaining her disability is a big relief

## 2023-02-20 NOTE — Progress Notes (Signed)
Jennifer [name]     MRN: [id number]      DOB: 03-06-1976   HPI Jennifer [name] is here for follow up and re-evaluation of chronic medical conditions, medication management and review of any available recent lab and radiology data.  Preventive health is updated, specifically  Cancer screening and Immunization.   Questions or concerns regarding consultations or procedures which the PT has had in the interim are  addressed.strated on higher dose of medication by psych and is responding well, no adverse s/e The PT denies any adverse reactions to current medications since the last visit.  Finally got Jennifer [name] disability so depression and anxiety are reduced Wants to go to water aerobics again, losing wright on current medication, requests dose increase , no issus with tolerating the drug   ROS Denies recent fever or chills. Denies sinus pressure, nasal congestion, ear pain or sore throat. Denies chest congestion, productive cough or wheezing. Denies chest pains, palpitations and leg swelling Denies abdominal pain, nausea, vomiting,diarrhea or constipation.   Denies dysuria, frequency, hesitancy or incontinence. Chronic  joint pain, swelling and limitation in mobility. Denies headaches, seizures, numbness, or tingling. Chronic  depression, anxiety or insomnia. Denies skin break down or rash.   PE  BP 124/82   Pulse (!) 101   Resp 16   Ht 5\' 7"  (1.702 m)   Wt (!) 311 lb (141.1 kg)   SpO2 93%   BMI 48.71 kg/m   Patient alert and oriented and in no cardiopulmonary distress.  HEENT: No facial asymmetry, EOMI,     Neck supple .  Chest: Clear to auscultation bilaterally.  CVS: S1, S2 no murmurs, no S3.Regular rate.  ABD: Soft non tender.   Ext: No edema  MS: Adequate ROM spine, shoulders, hips and knees.  Skin: Intact, no ulcerations or rash noted.  Psych: Good eye contact, normal affect. Memory intact not anxious or depressed appearing.  CNS: CN 2-12 intact, power,  normal  throughout.no focal deficits noted.   Assessment & Plan  Essential hypertension Controlled, no change in medication DASH diet and commitment to daily physical activity for a minimum of 30 minutes discussed and encouraged, as a part of hypertension management. The importance of attaining a healthy weight is also discussed.     02/16/2023   11:04 AM 02/16/2023   10:05 AM 12/23/2022    9:40 AM 12/23/2022    9:06 AM 12/23/2022    9:04 AM 11/16/2022   10:37 AM 11/16/2022   10:35 AM  BP/Weight  Systolic BP 124 138 120 135 140 130 130  Diastolic BP 82 88 84 81 85 92 94  Wt. (Lbs)  311   316.12    BMI  48.71 kg/m2   49.51 kg/m2         Severe episode of recurrent major depressive disorder, without psychotic features (HCC) I'm[proved with new medications, therapy and also recently obtaining Jennifer [name] disability is a big relief  Morbid obesity (HCC) Improved,  dose inc in med  Patient re-educated about  the importance of commitment to a  minimum of 150 minutes of exercise per week as able.  The importance of healthy food choices with portion control discussed, as well as eating regularly and within a 12 hour window most days. The need to choose "clean , green" food 50 to 75% of the time is discussed, as well as to make water the primary drink and set a goal of 64 ounces water daily.  02/16/2023   10:05 AM 12/23/2022    9:04 AM 11/16/2022   10:25 AM  Weight /BMI  Weight 311 lb 316 lb 1.9 oz 315 lb 1.9 oz  Height 5\' 7"  (1.702 m) 5\' 7"  (1.702 m) 5\' 7"  (1.702 m)  BMI 48.71 kg/m2 49.51 kg/m2 49.35 kg/m2      Generalized arthritis Disabling , benefits from [employer] wil try to get Jennifer [name] back into class  Controlled diabetes mellitus type 2 with complications [facility]) Ms. Roberta is reminded of the importance of commitment to daily physical activity for 30 minutes or more, as able and the need to limit carbohydrate intake to 30 to 60 grams per meal to help with blood sugar control.   The need to  take medication as prescribed, test blood sugar as directed, and to call between visits if there is a concern that blood sugar is uncontrolled is also discussed.   Ms. Laidler is reminded of the importance of daily foot exam, annual eye examination, and good blood sugar, blood pressure and cholesterol control. Updated lab needed at/ before next visit.      Latest Ref Rng & Units 12/23/2022    9:56 AM 11/16/2022   11:28 AM 09/10/2022    9:55 AM 08/17/2022    9:17 AM 07/02/2022   11:39 AM  Diabetic Labs  HbA1c 4.8 - 5.6 % 6.2   6.2   5.7   Micro/Creat Ratio 0 - 29 mg/g creat   28     Chol 100 - 199 mg/dL  161      HDL >09 mg/dL  54      Calc LDL 0 - 99 mg/dL  96      Triglycerides 0 - 149 mg/dL  63      Creatinine 6.04 - 1.00 mg/dL  5.40  9.81  1.91        02/16/2023   11:04 AM 02/16/2023   10:05 AM 12/23/2022    9:40 AM 12/23/2022    9:06 AM 12/23/2022    9:04 AM 11/16/2022   10:37 AM 11/16/2022   10:35 AM  BP/Weight  Systolic BP 124 138 120 135 140 130 130  Diastolic BP 82 88 84 81 85 92 94  Wt. (Lbs)  311   316.12    BMI  48.71 kg/m2   49.51 kg/m2         No data to display

## 2023-03-14 ENCOUNTER — Other Ambulatory Visit (HOSPITAL_COMMUNITY): Payer: Self-pay | Admitting: Psychiatry

## 2023-03-14 DIAGNOSIS — R5382 Chronic fatigue, unspecified: Secondary | ICD-10-CM

## 2023-03-14 DIAGNOSIS — F5081 Binge eating disorder: Secondary | ICD-10-CM

## 2023-03-14 MED ORDER — LISDEXAMFETAMINE DIMESYLATE 30 MG PO CAPS: 30.0000 mg | ORAL_CAPSULE | Freq: Every day | ORAL | 0 refills | Status: DC

## 2023-03-14 NOTE — Telephone Encounter (Signed)
-----   Message -----  From: Janyth Contes  Sent: 03/14/2023   7:37 AM EDT  To: [facility]  Subject: Medication Renewal Request                      Refills have been requested for the following medications:        lisdexamfetamine (VYVANSE) 30 MG capsule [Kenyatta Keidel A Annalis Kaczmarczyk]    Preferred pharmacy: [id number] - [place], [place] - [address]  Delivery method: Pickup  PDMP reviewed with appropriate fill history.  Refill sent to the above.

## 2023-03-18 ENCOUNTER — Other Ambulatory Visit (HOSPITAL_COMMUNITY): Payer: Self-pay | Admitting: Psychiatry

## 2023-03-18 ENCOUNTER — Other Ambulatory Visit: Payer: Self-pay | Admitting: Family Medicine

## 2023-03-18 DIAGNOSIS — F5081 Binge eating disorder: Secondary | ICD-10-CM

## 2023-03-18 DIAGNOSIS — F332 Major depressive disorder, recurrent severe without psychotic features: Secondary | ICD-10-CM

## 2023-03-18 DIAGNOSIS — F410 Panic disorder [episodic paroxysmal anxiety] without agoraphobia: Secondary | ICD-10-CM

## 2023-03-18 DIAGNOSIS — F4310 Post-traumatic stress disorder, unspecified: Secondary | ICD-10-CM

## 2023-04-12 ENCOUNTER — Telehealth (HOSPITAL_COMMUNITY): Payer: Commercial Managed Care - HMO | Admitting: Psychiatry

## 2023-04-14 ENCOUNTER — Other Ambulatory Visit (HOSPITAL_COMMUNITY): Payer: Self-pay | Admitting: Psychiatry

## 2023-04-14 DIAGNOSIS — F5081 Binge eating disorder: Secondary | ICD-10-CM

## 2023-04-14 DIAGNOSIS — R5382 Chronic fatigue, unspecified: Secondary | ICD-10-CM

## 2023-04-14 MED ORDER — LISDEXAMFETAMINE DIMESYLATE 30 MG PO CAPS: 30.0000 mg | ORAL_CAPSULE | Freq: Every day | ORAL | 0 refills | Status: DC

## 2023-04-14 NOTE — Telephone Encounter (Signed)
From: Janyth Contes To: Office of Elsie Lincoln, MD Sent: 04/12/2023 8:49 AM EDT Subject: Medication Renewal Request  Refills have been requested for the following medications:   lisdexamfetamine (VYVANSE) 30 MG capsule [Joffre Lucks A Gara Kincade]  Preferred pharmacy: [id number] - [place], [place] - [address] Delivery method: [employer]

## 2023-04-21 ENCOUNTER — Other Ambulatory Visit (HOSPITAL_COMMUNITY): Payer: Self-pay | Admitting: Psychiatry

## 2023-04-21 ENCOUNTER — Other Ambulatory Visit: Payer: Self-pay | Admitting: Family Medicine

## 2023-04-21 DIAGNOSIS — F5081 Binge eating disorder: Secondary | ICD-10-CM

## 2023-04-21 DIAGNOSIS — F410 Panic disorder [episodic paroxysmal anxiety] without agoraphobia: Secondary | ICD-10-CM

## 2023-04-21 DIAGNOSIS — F332 Major depressive disorder, recurrent severe without psychotic features: Secondary | ICD-10-CM

## 2023-04-21 DIAGNOSIS — F4310 Post-traumatic stress disorder, unspecified: Secondary | ICD-10-CM

## 2023-04-27 ENCOUNTER — Encounter (HOSPITAL_COMMUNITY): Payer: Self-pay | Admitting: Psychiatry

## 2023-04-27 ENCOUNTER — Telehealth (INDEPENDENT_AMBULATORY_CARE_PROVIDER_SITE_OTHER): Payer: Commercial Managed Care - HMO | Admitting: Psychiatry

## 2023-04-27 DIAGNOSIS — F411 Generalized anxiety disorder: Secondary | ICD-10-CM | POA: Diagnosis not present

## 2023-04-27 DIAGNOSIS — F331 Major depressive disorder, recurrent, moderate: Secondary | ICD-10-CM

## 2023-04-27 DIAGNOSIS — F410 Panic disorder [episodic paroxysmal anxiety] without agoraphobia: Secondary | ICD-10-CM | POA: Diagnosis not present

## 2023-04-27 DIAGNOSIS — F50819 Binge eating disorder, unspecified: Secondary | ICD-10-CM

## 2023-04-27 DIAGNOSIS — R5382 Chronic fatigue, unspecified: Secondary | ICD-10-CM

## 2023-04-27 DIAGNOSIS — F4310 Post-traumatic stress disorder, unspecified: Secondary | ICD-10-CM

## 2023-04-27 DIAGNOSIS — F5081 Binge eating disorder: Secondary | ICD-10-CM

## 2023-04-27 MED ORDER — LISDEXAMFETAMINE DIMESYLATE 30 MG PO CAPS: 30.0000 mg | ORAL_CAPSULE | Freq: Every day | ORAL | 0 refills | Status: DC

## 2023-04-27 MED ORDER — FLUOXETINE HCL 20 MG PO CAPS: 60.0000 mg | ORAL_CAPSULE | Freq: Every day | ORAL | 0 refills | Status: DC

## 2023-04-27 NOTE — Patient Instructions (Signed)
We did not make any medication changes today.  Keep up the good work with the lifestyle modifications you have been making and when you get a chance please reach out to get that sleep study and the CPAP machine.
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
## 2023-05-04 ENCOUNTER — Encounter: Payer: Self-pay | Admitting: Family Medicine

## 2023-05-05 NOTE — Telephone Encounter (Signed)
Contacted patient patient out of town today and be back hopefully Sunday, scheduled Monday,06.24.2024 at 10:00 with Iliana. Patient will call if not back in town to come to this appointment

## 2023-05-06 ENCOUNTER — Ambulatory Visit: Payer: Commercial Managed Care - HMO | Admitting: Family Medicine

## 2023-05-09 ENCOUNTER — Encounter: Payer: Self-pay | Admitting: Family Medicine

## 2023-05-09 ENCOUNTER — Ambulatory Visit (INDEPENDENT_AMBULATORY_CARE_PROVIDER_SITE_OTHER): Payer: Commercial Managed Care - HMO | Admitting: Family Medicine

## 2023-05-09 VITALS — BP 116/90 | HR 105 | Ht 67.0 in | Wt 318.0 lb

## 2023-05-09 DIAGNOSIS — R300 Dysuria: Secondary | ICD-10-CM

## 2023-05-09 DIAGNOSIS — N390 Urinary tract infection, site not specified: Secondary | ICD-10-CM | POA: Diagnosis not present

## 2023-05-09 MED ORDER — SULFAMETHOXAZOLE-TRIMETHOPRIM 800-160 MG PO TABS: 1.0000 | ORAL_TABLET | Freq: Two times a day (BID) | ORAL | 0 refills | Status: AC

## 2023-05-09 NOTE — Patient Instructions (Addendum)
        Great to see you today.   - Please take medications as prescribed. - Follow up with your primary health provider if any health concerns arises. - If symptoms worsen please contact your primary care provider and/or visit the emergency department.  

## 2023-05-09 NOTE — Assessment & Plan Note (Signed)
Bactrim 800-160 mg x 5 days Urinalysis, NuSwab and urine culture ordered- Awaiting results will follow up. May take OTC AZO for urinary pain relief. Explained to increase oral fluid intake. Drink 8 glasses of water daily.  Follow up for worsening or persistent symptoms. Patient verbalizes understanding regarding plan of care and all questions answered.  

## 2023-05-09 NOTE — Progress Notes (Signed)
Patient Office Visit   Subjective   Patient ID: Jennifer [name], female    DOB: 1976-07-08  Age: 47 y.o. MRN: [id number]  CC:  Chief Complaint  Patient presents with   Acute Visit    OFFICE VISIT - UTI    HPI Jennifer [name] 47 year old female presents to the clinic for urinary symptoms started last Thursday.  She  has a past medical history of Abnormal vaginal Pap smear, Acid reflux, Alopecia, Anxiety, Arthritis, Cough (09/13/2022), Depression, Depression, major, single episode, severe (HCC) (06/18/2019), Diabetes mellitus without complication (HCC), Diverticulosis, Family history of diabetes mellitus, GERD (gastroesophageal reflux disease), Hemorrhoids, History of recurrent UTIs, Hypertension, and Obesity.  Urinary Tract Infection  This is a new problem. The problem occurs every urination. The problem has been gradually worsening. Quality: discomfot. There has been no fever. She is Not sexually active. There is No history of pyelonephritis. Associated symptoms include chills, frequency, hesitancy, sweats and urgency. Pertinent negatives include no discharge, flank pain, hematuria or vomiting. She has tried increased fluids for the symptoms. The treatment provided mild relief. There is no history of kidney stones or recurrent UTIs.      Outpatient Encounter Medications as of 05/09/2023  Medication Sig   ACCU-CHEK GUIDE test strip USE TO CHECK SUGAR UP TO 4 TIMES DAILY AS DIRECTED E10.9 E11.9   amLODipine (NORVASC) 2.5 MG tablet TAKE 1 TABLET BY MOUTH EVERY DAY   amLODipine (NORVASC) 5 MG tablet TAKE 1 TABLET (5 MG TOTAL) BY MOUTH DAILY.   clotrimazole-betamethasone (LOTRISONE) cream APPLY TO AFFECTED AREA TWICE A DAY   exenatide (BYETTA) 10 MCG/0.04ML SOPN injection Inject 10 mcg into the skin 2 (two) times daily with a meal.   FLUoxetine (PROZAC) 20 MG capsule Take 3 capsules (60 mg total) by mouth daily.   fluticasone (FLONASE) 50 MCG/ACT nasal spray SPRAY 2 SPRAYS INTO EACH NOSTRIL  EVERY DAY   gabapentin (NEURONTIN) 300 MG capsule TAKE 1 CAPSULE BY MOUTH EVERYDAY AT BEDTIME   INCASSIA 0.35 MG tablet Take 1 tablet by mouth daily.   Insulin Pen Needle (B-D ULTRAFINE III SHORT PEN) 31G X 8 MM MISC Use with insulin twice daily.   levocetirizine (XYZAL) 5 MG tablet TAKE 1 TABLET BY MOUTH EVERY DAY IN THE EVENING   [START ON 07/17/2023] lisdexamfetamine (VYVANSE) 30 MG capsule Take 1 capsule (30 mg total) by mouth daily.   megestrol (MEGACE) 40 MG tablet TAKE 2 TABLETS BY MOUTH EVERY DAY   meloxicam (MOBIC) 15 MG tablet TAKE 1 TABLET (15 MG TOTAL) BY MOUTH DAILY.   oxybutynin (DITROPAN-XL) 10 MG 24 hr tablet TAKE 1 TABLET BY MOUTH EVERYDAY AT BEDTIME   sulfamethoxazole-trimethoprim (BACTRIM DS) 800-160 MG tablet Take 1 tablet by mouth 2 (two) times daily for 5 days.   triamterene-hydrochlorothiazide (MAXZIDE) 75-50 MG tablet TAKE 1 TABLET BY MOUTH EVERY DAY   [START ON 05/20/2023] lisdexamfetamine (VYVANSE) 30 MG capsule Take 1 capsule (30 mg total) by mouth daily. (Patient not taking: Reported on 05/09/2023)   [START ON 06/18/2023] lisdexamfetamine (VYVANSE) 30 MG capsule Take 1 capsule (30 mg total) by mouth daily. (Patient not taking: Reported on 05/09/2023)   No facility-administered encounter medications on file as of 05/09/2023.    Past Surgical History:  Procedure Laterality Date   APPENDECTOMY     BREAST BIOPSY Left 01/24/2023   MM LT BREAST BX W LOC DEV 1ST LESION IMAGE BX SPEC STEREO GUIDE 01/24/2023 [facility]   COLONOSCOPY N/A 03/13/2014  Dr. Jena Gauss- grade 3 hemorrhoids, colonic diverticulosis bx= benign lymphoid polyp   COLONOSCOPY WITH PROPOFOL N/A 01/26/2021   Procedure: COLONOSCOPY WITH PROPOFOL;  Surgeon: Corbin Ade, MD;  Location: [facility];  Service: Endoscopy;  Laterality: N/A;  AM   KNEE ARTHROSCOPY     right   POLYPECTOMY  01/26/2021   Procedure: POLYPECTOMY;  Surgeon: Corbin Ade, MD;  Location: [facility];  Service: Endoscopy;;     Review of Systems  Constitutional:  Positive for chills. Negative for fever.  Eyes:  Negative for blurred vision.  Respiratory:  Negative for shortness of breath.   Cardiovascular:  Negative for chest pain.  Gastrointestinal:  Negative for vomiting.  Genitourinary:  Positive for dysuria, frequency, hesitancy and urgency. Negative for flank pain and hematuria.  Neurological:  Negative for dizziness and headaches.      Objective    BP (!) 116/90   Pulse (!) 105   Ht 5\' 7"  (1.702 m)   Wt (!) 318 lb (144.2 kg)   SpO2 98%   BMI 49.81 kg/m   Physical Exam Vitals reviewed.  Constitutional:      General: She is not in acute distress.    Appearance: Normal appearance. She is not ill-appearing, toxic-appearing or diaphoretic.  HENT:     Head: Normocephalic.  Eyes:     General:        Right eye: No discharge.        Left eye: No discharge.     Conjunctiva/sclera: Conjunctivae normal.  Cardiovascular:     Rate and Rhythm: Normal rate.     Pulses: Normal pulses.     Heart sounds: Normal heart sounds.  Pulmonary:     Effort: Pulmonary effort is normal. No respiratory distress.     Breath sounds: Normal breath sounds.  Abdominal:     General: Bowel sounds are normal.     Palpations: Abdomen is soft.     Tenderness: There is no abdominal tenderness. There is no right CVA tenderness, left CVA tenderness or guarding.  Musculoskeletal:        General: Normal range of motion.     Cervical back: Normal range of motion.  Skin:    General: Skin is warm and dry.     Capillary Refill: Capillary refill takes less than 2 seconds.  Neurological:     General: No focal deficit present.     Mental Status: She is alert and oriented to person, place, and time.     Coordination: Coordination normal.     Gait: Gait normal.  Psychiatric:        Mood and Affect: Mood normal.        Behavior: Behavior normal.       Assessment & Plan:  Urinary tract infection without hematuria, site  unspecified Assessment & Plan: Bactrim 800-160 mg x 5 days Urinalysis, NuSwab and urine culture ordered- Awaiting results will follow up. May take OTC AZO for urinary pain relief. Explained to increase oral fluid intake. Drink 8 glasses of water daily.  Follow up for worsening or persistent symptoms. Patient verbalizes understanding regarding plan of care and all questions answered.   Orders: -     Sulfamethoxazole-Trimethoprim; Take 1 tablet by mouth 2 (two) times daily for 5 days.  Dispense: 10 tablet; Refill: 0  Dysuria -     Urinalysis, Routine w reflex microscopic; Future -     Urine Culture; Future    Return if symptoms worsen or fail to  improve.   Cruzita Lederer Newman Nip, FNP

## 2023-05-09 NOTE — Addendum Note (Signed)
Addended by: Rica Records on: 05/09/2023 04:38 PM   Modules accepted: Orders

## 2023-05-09 NOTE — Addendum Note (Signed)
Addended by: Arta Silence on: 05/09/2023 01:52 PM   Modules accepted: Orders

## 2023-05-10 LAB — MICROSCOPIC EXAMINATION
Casts: NONE SEEN /lpf
WBC, UA: >30 /hpf — AB (ref 0–5)

## 2023-05-10 LAB — URINALYSIS, ROUTINE W REFLEX MICROSCOPIC
Bilirubin, UA: NEGATIVE
Glucose, UA: NEGATIVE
Ketones, UA: NEGATIVE
Nitrite, UA: NEGATIVE
Protein,UA: NEGATIVE
Specific Gravity, UA: 1.011 (ref 1.005–1.030)
Urobilinogen, Ur: 0.2 mg/dL (ref 0.2–1.0)
pH, UA: 8 — ABNORMAL HIGH (ref 5.0–7.5)

## 2023-05-13 LAB — URINE CULTURE

## 2023-05-14 ENCOUNTER — Other Ambulatory Visit (HOSPITAL_COMMUNITY): Payer: Self-pay | Admitting: Psychiatry

## 2023-05-14 ENCOUNTER — Other Ambulatory Visit: Payer: Self-pay | Admitting: Family Medicine

## 2023-05-14 DIAGNOSIS — F5081 Binge eating disorder: Secondary | ICD-10-CM

## 2023-05-14 DIAGNOSIS — F4310 Post-traumatic stress disorder, unspecified: Secondary | ICD-10-CM

## 2023-05-14 DIAGNOSIS — F50819 Binge eating disorder, unspecified: Secondary | ICD-10-CM

## 2023-05-14 DIAGNOSIS — F410 Panic disorder [episodic paroxysmal anxiety] without agoraphobia: Secondary | ICD-10-CM

## 2023-05-14 DIAGNOSIS — F332 Major depressive disorder, recurrent severe without psychotic features: Secondary | ICD-10-CM

## 2023-05-18 ENCOUNTER — Telehealth (HOSPITAL_COMMUNITY): Payer: Self-pay

## 2023-05-18 ENCOUNTER — Telehealth: Payer: Self-pay | Admitting: Family Medicine

## 2023-05-18 NOTE — Telephone Encounter (Signed)
[phone]- Insurance Phone Number - [employer] [employer] Needs Vyvance 30 mg prior auth for refill next week.

## 2023-05-18 NOTE — Telephone Encounter (Signed)
[facility] [phone] called needs piror authorization on, pharmacy got them cover my meds for prior authorization key code # [id number]  exenatide (BYETTA) 10 MCG/0.04ML SOPN injection [[id number]

## 2023-05-20 NOTE — Telephone Encounter (Signed)
Medication management - telephone call with pharmacist at the [facility] in [place], [place], after completing patient's prior authorization for her prescribed Vyvanse 30 mg online in CoverMyMeds, to inform this was approved by pt's [employer].

## 2023-06-01 NOTE — Telephone Encounter (Signed)
Letter in providers box of covered meds

## 2023-06-14 ENCOUNTER — Ambulatory Visit: Payer: Commercial Managed Care - HMO | Admitting: Family Medicine

## 2023-06-14 ENCOUNTER — Encounter: Payer: Self-pay | Admitting: Family Medicine

## 2023-06-20 ENCOUNTER — Encounter: Payer: Self-pay | Admitting: Skilled Nursing Facility1

## 2023-06-20 ENCOUNTER — Encounter: Payer: Medicare Other | Attending: General Surgery | Admitting: Skilled Nursing Facility1

## 2023-06-20 NOTE — Progress Notes (Signed)
Pre-Operative Nutrition Class:    Patient was seen on 07/10/2023 for Pre-Operative Bariatric Surgery Education at [facility].    Surgery date: 08/09/23 Surgery type: RYGB Start weight at [facility]: 310.2 Weight today: 319.2   The following the learning objectives were met by the patient during this course: Identify Pre-Op Dietary Goals and will begin 2 weeks pre-operatively Identify appropriate sources of fluids and proteins  State protein recommendations and appropriate sources pre and post-operatively Identify Post-Operative Dietary Goals and will follow for 2 weeks post-operatively Identify appropriate multivitamin and calcium sources Describe the need for physical activity post-operatively and will follow MD recommendations State when to call healthcare provider regarding medication questions or post-operative complications When having a diagnosis of diabetes understanding hypoglycemia symptoms and the inclusion of 1 complex carbohydrate per meal  Handouts given during class include: Pre-Op Bariatric Surgery Diet Handout Protein Shake Handout Post-Op Bariatric Surgery Nutrition Handout BELT Program Information Flyer Support Group Information Flyer [facility] Bariatric Supplements Price List  Follow-Up Plan: Patient will follow-up at [facility] 2 weeks post operatively for diet advancement per MD.

## 2023-07-09 ENCOUNTER — Other Ambulatory Visit: Payer: Self-pay | Admitting: Family Medicine

## 2023-07-14 NOTE — Telephone Encounter (Signed)
No available  appt without added to provider schedule , can do sept 10 at 2:00

## 2023-07-19 ENCOUNTER — Other Ambulatory Visit: Payer: Self-pay | Admitting: Family Medicine

## 2023-07-19 DIAGNOSIS — E118 Type 2 diabetes mellitus with unspecified complications: Secondary | ICD-10-CM

## 2023-07-21 NOTE — Progress Notes (Signed)
Sent message, via epic in basket, requesting orders in epic from surgeon.  

## 2023-07-22 ENCOUNTER — Encounter: Payer: Self-pay | Admitting: Pharmacist

## 2023-07-26 ENCOUNTER — Other Ambulatory Visit: Payer: Self-pay | Admitting: Family Medicine

## 2023-07-26 ENCOUNTER — Ambulatory Visit (INDEPENDENT_AMBULATORY_CARE_PROVIDER_SITE_OTHER): Payer: Medicare Other | Admitting: Family Medicine

## 2023-07-26 ENCOUNTER — Other Ambulatory Visit: Payer: Self-pay

## 2023-07-26 ENCOUNTER — Encounter: Payer: Self-pay | Admitting: Family Medicine

## 2023-07-26 ENCOUNTER — Ambulatory Visit: Payer: Self-pay | Admitting: General Surgery

## 2023-07-26 VITALS — BP 126/77 | HR 102 | Ht 67.0 in | Wt 318.0 lb

## 2023-07-26 DIAGNOSIS — I10 Essential (primary) hypertension: Secondary | ICD-10-CM

## 2023-07-26 DIAGNOSIS — Z23 Encounter for immunization: Secondary | ICD-10-CM

## 2023-07-26 DIAGNOSIS — R300 Dysuria: Secondary | ICD-10-CM | POA: Diagnosis not present

## 2023-07-26 DIAGNOSIS — Z01818 Encounter for other preprocedural examination: Secondary | ICD-10-CM

## 2023-07-26 DIAGNOSIS — R7303 Prediabetes: Secondary | ICD-10-CM

## 2023-07-26 DIAGNOSIS — M1990 Unspecified osteoarthritis, unspecified site: Secondary | ICD-10-CM

## 2023-07-26 DIAGNOSIS — E118 Type 2 diabetes mellitus with unspecified complications: Secondary | ICD-10-CM

## 2023-07-26 DIAGNOSIS — F331 Major depressive disorder, recurrent, moderate: Secondary | ICD-10-CM

## 2023-07-26 NOTE — Patient Instructions (Addendum)
Annual with pap end October  CCUA today  HBA1C today  Flu vaccine today  Meds as listed  You are being referred to Urology re difficulty urinating and accidents  Best with surgery, this is best for your health  Thanks for choosing  Primary Care, we consider it a privelige to serve you.

## 2023-07-27 LAB — URINALYSIS
Bilirubin, UA: NEGATIVE
Glucose, UA: NEGATIVE
Ketones, UA: NEGATIVE
Nitrite, UA: NEGATIVE
Protein,UA: NEGATIVE
Specific Gravity, UA: 1.02 (ref 1.005–1.030)
Urobilinogen, Ur: 0.2 mg/dL (ref 0.2–1.0)
pH, UA: 7 (ref 5.0–7.5)

## 2023-07-27 LAB — HEMOGLOBIN A1C
Est. average glucose Bld gHb Est-mCnc: 131 mg/dL
Hgb A1c MFr Bld: 6.2 % — ABNORMAL HIGH (ref 4.8–5.6)

## 2023-07-27 LAB — BMP8+EGFR
BUN/Creatinine Ratio: 15 (ref 9–23)
BUN: 15 mg/dL (ref 6–24)
CO2: 21 mmol/L (ref 20–29)
Calcium: 9.9 mg/dL (ref 8.7–10.2)
Chloride: 98 mmol/L (ref 96–106)
Creatinine, Ser: 1.03 mg/dL — ABNORMAL HIGH (ref 0.57–1.00)
Glucose: 81 mg/dL (ref 70–99)
Potassium: 3.9 mmol/L (ref 3.5–5.2)
Sodium: 140 mmol/L (ref 134–144)
eGFR: 67 mL/min/{1.73_m2} (ref >59)

## 2023-07-27 LAB — CBC
Hematocrit: 43.7 % (ref 34.0–46.6)
Hemoglobin: 14.5 g/dL (ref 11.1–15.9)
MCH: 30.9 pg (ref 26.6–33.0)
MCHC: 33.2 g/dL (ref 31.5–35.7)
MCV: 93 fL (ref 79–97)
Platelets: 405 10*3/uL (ref 150–450)
RBC: 4.7 x10E6/uL (ref 3.77–5.28)
RDW: 13.3 % (ref 11.7–15.4)
WBC: 8.3 10*3/uL (ref 3.4–10.8)

## 2023-07-27 NOTE — Patient Instructions (Signed)
SURGICAL WAITING ROOM VISITATION Patients having surgery or a procedure may have no more than 2 support people in the waiting area - these visitors may rotate in the visitor waiting room.   Due to an increase in RSV and influenza rates and associated hospitalizations, children ages 30 and under may not visit patients in [facility] hospitals. If the patient needs to stay at the hospital during part of their recovery, the visitor guidelines for inpatient rooms apply.  PRE-OP VISITATION  Pre-op nurse will coordinate an appropriate time for 1 support person to accompany the patient in pre-op.  This support person may not rotate.  This visitor will be contacted when the time is appropriate for the visitor to come back in the pre-op area.  Please refer to the [facility] website for the visitor guidelines for Inpatients (after your surgery is over and you are in a regular room).  You are not required to quarantine at this time prior to your surgery. However, you must do this: Hand Hygiene often Do NOT share personal items Notify your provider if you are in close contact with someone who has COVID or you develop fever 100.4 or greater, new onset of sneezing, cough, sore throat, shortness of breath or body aches.  If you test positive for Covid or have been in contact with anyone that has tested positive in the last 10 days please notify you surgeon.    Your procedure is scheduled on:  Tuesday   August 09, 2023   Report to [facility] Main Entrance: [place] entrance where the [employer] is available.   Report to admitting at:  05:15   AM  Call this number if you have any questions or problems the morning of surgery [phone]  Do not eat food after Midnight the night prior to your surgery/procedure.  After Midnight you may have the following liquids until   04:30  AM DAY OF SURGERY  Clear Liquid Diet Water Black Coffee (sugar ok, NO MILK/CREAM OR CREAMERS)  Tea (sugar ok,  NO MILK/CREAM OR CREAMERS) regular and decaf                             Plain Jell-O  with no fruit (NO RED)                                           Fruit ices (not with fruit pulp, NO RED)                                     Popsicles (NO RED)                                                                  Juice: NO CITRUS JUICES: only apple, WHITE grape, WHITE cranberry Sports drinks like Gatorade or Powerade (NO RED)                   The day of surgery:  Drink ONE (1) Pre-Surgery G2 at 04:30  AM the morning  of surgery. Drink in one sitting. Do not sip.  This drink was given to you during your hospital pre-op appointment visit. Nothing else to drink after completing the Pre-Surgery G2 : No candy, chewing gum or throat lozenges.    FOLLOW BOWEL PREP AND ANY ADDITIONAL PRE OP INSTRUCTIONS YOU RECEIVED FROM YOUR SURGEON'S OFFICE!!!   Oral Hygiene is also important to reduce your risk of infection.        Remember - BRUSH YOUR TEETH THE MORNING OF SURGERY WITH YOUR REGULAR TOOTHPASTE  Do NOT smoke after Midnight the night before surgery.  STOP TAKING all Vitamins, Herbs and supplements 1 week before your surgery.   Take ONLY these medicines the morning of surgery with A SIP OF WATER: Amlodipine, Fluoxetine (Prozac),   you may use your Flonase Nasal spray if needed.                    You may not have any metal on your body including hair pins, jewelry, and body piercing  Do not wear make-up, lotions, powders, perfumes , or deodorant  Do not wear nail polish including gel and S&S, artificial / acrylic nails, or any other type of covering on natural nails including finger and toenails. If you have artificial nails, gel coating, etc., that needs to be removed by a nail salon, Please have this removed prior to surgery. Not doing so may mean that your surgery could be cancelled or delayed if the Surgeon or anesthesia staff feels like they are unable to monitor you safely.   Do not  shave 48 hours prior to surgery to avoid nicks in your skin which may contribute to postoperative infections.    Contacts, Hearing Aids, dentures or bridgework may not be worn into surgery. DENTURES WILL BE REMOVED PRIOR TO SURGERY PLEASE DO NOT APPLY "Poly grip" OR ADHESIVES!!!  You may bring a small overnight bag with you on the day of surgery, only pack items that are not valuable. [facility] IS NOT RESPONSIBLE   FOR VALUABLES THAT ARE LOST OR STOLEN.   Do not bring your home medications to the hospital. The Pharmacy will dispense medications listed on your medication list to you during your admission in the Hospital.  Please read over the following fact sheets you were given: IF YOU HAVE QUESTIONS ABOUT YOUR PRE-OP INSTRUCTIONS, PLEASE CALL [phone]   [facility] - Preparing for Surgery Before surgery, you can play an important role.  Because skin is not sterile, your skin needs to be as free of germs as possible.  You can reduce the number of germs on your skin by washing with CHG (chlorahexidine gluconate) soap before surgery.  CHG is an antiseptic cleaner which kills germs and bonds with the skin to continue killing germs even after washing. Please DO NOT use if you have an allergy to CHG or antibacterial soaps.  If your skin becomes reddened/irritated stop using the CHG and inform your nurse when you arrive at Short Stay. Do not shave (including legs and underarms) for at least 48 hours prior to the first CHG shower.  You may shave your face/neck.  Please follow these instructions carefully:  1.  Shower with CHG Soap the night before surgery and the  morning of surgery.  2.  If you choose to wash your hair, wash your hair first as usual with your normal  shampoo.  3.  After you shampoo, rinse your hair and body thoroughly to remove the shampoo.  4.  Use CHG as you would any other liquid soap.  You can apply chg directly to the skin and wash.  Gently with a  scrungie or clean washcloth.  5.  Apply the CHG Soap to your body ONLY FROM THE NECK DOWN.   Do not use on face/ open                           Wound or open sores. Avoid contact with eyes, ears mouth and genitals (private parts).                       Wash face,  Genitals (private parts) with your normal soap.             6.  Wash thoroughly, paying special attention to the area where your  surgery  will be performed.  7.  Thoroughly rinse your body with warm water from the neck down.  8.  DO NOT shower/wash with your normal soap after using and rinsing off the CHG Soap.            9.  Pat yourself dry with a clean towel.            10.  Wear clean pajamas.            11.  Place clean sheets on your bed the night of your first shower and do not  sleep with pets.  ON THE DAY OF SURGERY : Do not apply any lotions/deodorants the morning of surgery.  Please wear clean clothes to the hospital/surgery center.     FAILURE TO FOLLOW THESE INSTRUCTIONS MAY RESULT IN THE CANCELLATION OF YOUR SURGERY  PATIENT SIGNATURE_________________________________  NURSE SIGNATURE__________________________________  ________________________________________________________________________      Rogelia Mire    An incentive spirometer is a tool that can help keep your lungs clear and active. This tool measures how well you are filling your lungs with each breath. Taking long deep breaths may help reverse or decrease the chance of developing breathing (pulmonary) problems (especially infection) following: A long period of time when you are unable to move or be active. BEFORE THE PROCEDURE  If the spirometer includes an indicator to show your best effort, your nurse or respiratory therapist will set it to a desired goal. If possible, sit up straight or lean slightly forward. Try not to slouch. Hold the incentive spirometer in an upright position. INSTRUCTIONS FOR USE  Sit on the edge of your bed if  possible, or sit up as far as you can in bed or on a chair. Hold the incentive spirometer in an upright position. Breathe out normally. Place the mouthpiece in your mouth and seal your lips tightly around it. Breathe in slowly and as deeply as possible, raising the piston or the ball toward the top of the column. Hold your breath for 3-5 seconds or for as long as possible. Allow the piston or ball to fall to the bottom of the column. Remove the mouthpiece from your mouth and breathe out normally. Rest for a few seconds and repeat Steps 1 through 7 at least 10 times every 1-2 hours when you are awake. Take your time and take a few normal breaths between deep breaths. The spirometer may include an indicator to show your best effort. Use the indicator as a goal to work toward during each repetition. After each set of 10 deep breaths, practice coughing to be  sure your lungs are clear. If you have an incision (the cut made at the time of surgery), support your incision when coughing by placing a pillow or rolled up towels firmly against it. Once you are able to get out of bed, walk around indoors and cough well. You may stop using the incentive spirometer when instructed by your caregiver.  RISKS AND COMPLICATIONS Take your time so you do not get dizzy or light-headed. If you are in pain, you may need to take or ask for pain medication before doing incentive spirometry. It is harder to take a deep breath if you are having pain. AFTER USE Rest and breathe slowly and easily. It can be helpful to keep track of a log of your progress. Your caregiver can provide you with a simple table to help with this. If you are using the spirometer at home, follow these instructions: SEEK MEDICAL CARE IF:  You are having difficultly using the spirometer. You have trouble using the spirometer as often as instructed. Your pain medication is not giving enough relief while using the spirometer. You develop fever of  100.5 F (38.1 C) or higher.                                                                                                    SEEK IMMEDIATE MEDICAL CARE IF:  You cough up bloody sputum that had not been present before. You develop fever of 102 F (38.9 C) or greater. You develop worsening pain at or near the incision site. MAKE SURE YOU:  Understand these instructions. Will watch your condition. Will get help right away if you are not doing well or get worse. Document Released: 03/14/2007 Document Revised: 01/24/2012 Document Reviewed: 05/15/2007 [facility] Patient Information 2014 [place], [place].

## 2023-07-27 NOTE — Progress Notes (Signed)
COVID Vaccine received:  []  No [x]  Yes Date of any COVID positive Test in last 90 days:  PCP - Syliva Overman, MD  Cardiologist - Carolan Clines, MD  Chest x-ray - 04-26-2022  2v  Epic   EKG -  (02-08-22 Epic)  will repeat Stress Test -  ECHO -  Cardiac Cath -   PCR screen: []  Ordered & Completed           []   No Order but Needs PROFEND           [x]   N/A for this surgery  Surgery Plan:  []  Ambulatory   []  Outpatient in bed  [x]  Admit  Anesthesia:    [x]  General  []  Spinal  []   Choice []   MAC  Bowel Prep - [x]  No  []   Yes _Bariatric diet etc._____  Pacemaker / ICD device [x]  No []  Yes   Spinal Cord Stimulator:[x]  No []  Yes       History of Sleep Apnea? []  No [x]  Yes   Mild OSA CPAP used?- [x]  No []  Yes    Does the patient monitor blood sugar?  []  No []  Yes  []  N/A  Patient has: []  NO Hx DM   [x]  Pre-DM   []  DM1  []   DM2 Last A1c was: 6.2 on  07-26-23    Does patient have a [employer] or Dexacom? []  No []  Yes   Fasting Blood Sugar Ranges-  Checks Blood Sugar _____ times a day  Blood Thinner / Instructions:  none Aspirin Instructions:  none  ERAS Protocol Ordered: []  No  [x]  Yes PRE-SURGERY []  ENSURE  [x]  G2   []  No Drink Ordered Patient is to be NPO after:   0430  Comments:   Activity level: Patient is able / unable to climb a flight of stairs without difficulty; []  No CP  []  No SOB, but would have ___   Patient can / can not perform ADLs without assistance.   Anesthesia review: HTN, GAD, MDD, PTSD, Hx Tuberculosis, Pre-DM, mild OSA- No CPAP,GERD  Patient denies shortness of breath, fever, cough and chest pain at PAT appointment.  Patient verbalized understanding and agreement to the Pre-Surgical Instructions that were given to them at this PAT appointment. Patient was also educated of the need to review these PAT instructions again prior to her surgery.I reviewed the appropriate phone numbers to call if they have any and questions or concerns.

## 2023-07-28 ENCOUNTER — Encounter (HOSPITAL_COMMUNITY)
Admission: RE | Admit: 2023-07-28 | Discharge: 2023-07-28 | Disposition: A | Discharge status: Home / Self Care | Payer: Medicare Other | Source: Ambulatory Visit | Attending: General Surgery | Admitting: General Surgery

## 2023-07-28 ENCOUNTER — Telehealth (HOSPITAL_COMMUNITY): Payer: Commercial Managed Care - HMO | Admitting: Psychiatry

## 2023-07-28 ENCOUNTER — Other Ambulatory Visit: Payer: Self-pay

## 2023-07-28 ENCOUNTER — Encounter (HOSPITAL_COMMUNITY): Payer: Self-pay

## 2023-07-28 VITALS — BP 138/93 | HR 100 | Temp 98.6°F | Resp 20 | Ht 67.0 in | Wt 317.9 lb

## 2023-07-28 DIAGNOSIS — R7303 Prediabetes: Secondary | ICD-10-CM

## 2023-07-28 DIAGNOSIS — R9431 Abnormal electrocardiogram [ECG] [EKG]: Secondary | ICD-10-CM | POA: Insufficient documentation

## 2023-07-28 DIAGNOSIS — E118 Type 2 diabetes mellitus with unspecified complications: Secondary | ICD-10-CM

## 2023-07-28 DIAGNOSIS — Z01812 Encounter for preprocedural laboratory examination: Secondary | ICD-10-CM | POA: Insufficient documentation

## 2023-07-28 DIAGNOSIS — Z01810 Encounter for preprocedural cardiovascular examination: Secondary | ICD-10-CM | POA: Insufficient documentation

## 2023-07-28 DIAGNOSIS — Z01818 Encounter for other preprocedural examination: Secondary | ICD-10-CM

## 2023-07-28 HISTORY — DX: Post-traumatic stress disorder, unspecified: F43.10

## 2023-07-28 HISTORY — DX: Prediabetes: R73.03

## 2023-07-28 LAB — CBC WITH DIFFERENTIAL/PLATELET
Abs Immature Granulocytes: 0.02 10*3/uL (ref 0.00–0.07)
Basophils Absolute: 0 10*3/uL (ref 0.0–0.1)
Basophils Relative: 0 %
Eosinophils Absolute: 0.2 10*3/uL (ref 0.0–0.5)
Eosinophils Relative: 3 %
HCT: 41.4 % (ref 36.0–46.0)
Hemoglobin: 13.8 g/dL (ref 12.0–15.0)
Immature Granulocytes: 0 %
Lymphocytes Relative: 28 %
Lymphs Abs: 2.1 10*3/uL (ref 0.7–4.0)
MCH: 30.5 pg (ref 26.0–34.0)
MCHC: 33.3 g/dL (ref 30.0–36.0)
MCV: 91.6 fL (ref 80.0–100.0)
Monocytes Absolute: 0.5 10*3/uL (ref 0.1–1.0)
Monocytes Relative: 7 %
Neutro Abs: 4.7 10*3/uL (ref 1.7–7.7)
Neutrophils Relative %: 62 %
Platelets: 371 10*3/uL (ref 150–400)
RBC: 4.52 MIL/uL (ref 3.87–5.11)
RDW: 13.9 % (ref 11.5–15.5)
WBC: 7.6 10*3/uL (ref 4.0–10.5)
nRBC: 0 % (ref 0.0–0.2)

## 2023-07-28 LAB — COMPREHENSIVE METABOLIC PANEL
ALT: 25 U/L (ref 0–44)
AST: 23 U/L (ref 15–41)
Albumin: 4.2 g/dL (ref 3.5–5.0)
Alkaline Phosphatase: 83 U/L (ref 38–126)
Anion gap: 12 (ref 5–15)
BUN: 20 mg/dL (ref 6–20)
CO2: 21 mmol/L — ABNORMAL LOW (ref 22–32)
Calcium: 9.3 mg/dL (ref 8.9–10.3)
Chloride: 102 mmol/L (ref 98–111)
Creatinine, Ser: 0.97 mg/dL (ref 0.44–1.00)
GFR, Estimated: >60 mL/min (ref >60)
Glucose, Bld: 92 mg/dL (ref 70–99)
Potassium: 3 mmol/L — ABNORMAL LOW (ref 3.5–5.1)
Sodium: 135 mmol/L (ref 135–145)
Total Bilirubin: 0.7 mg/dL (ref 0.3–1.2)
Total Protein: 8 g/dL (ref 6.5–8.1)

## 2023-07-28 LAB — TYPE AND SCREEN
ABO/RH(D): O POS
Antibody Screen: NEGATIVE

## 2023-07-29 ENCOUNTER — Encounter: Payer: Self-pay | Admitting: Family Medicine

## 2023-07-29 ENCOUNTER — Encounter (HOSPITAL_COMMUNITY): Payer: Self-pay | Admitting: Psychiatry

## 2023-07-29 ENCOUNTER — Telehealth (INDEPENDENT_AMBULATORY_CARE_PROVIDER_SITE_OTHER): Payer: Medicare Other | Admitting: Psychiatry

## 2023-07-29 DIAGNOSIS — R300 Dysuria: Secondary | ICD-10-CM | POA: Insufficient documentation

## 2023-07-29 DIAGNOSIS — Z01818 Encounter for other preprocedural examination: Secondary | ICD-10-CM | POA: Insufficient documentation

## 2023-07-29 DIAGNOSIS — F5081 Binge eating disorder: Secondary | ICD-10-CM

## 2023-07-29 DIAGNOSIS — F411 Generalized anxiety disorder: Secondary | ICD-10-CM | POA: Diagnosis not present

## 2023-07-29 DIAGNOSIS — F410 Panic disorder [episodic paroxysmal anxiety] without agoraphobia: Secondary | ICD-10-CM

## 2023-07-29 DIAGNOSIS — F50819 Binge eating disorder, unspecified: Secondary | ICD-10-CM

## 2023-07-29 DIAGNOSIS — F331 Major depressive disorder, recurrent, moderate: Secondary | ICD-10-CM

## 2023-07-29 DIAGNOSIS — F5105 Insomnia due to other mental disorder: Secondary | ICD-10-CM

## 2023-07-29 DIAGNOSIS — F4310 Post-traumatic stress disorder, unspecified: Secondary | ICD-10-CM

## 2023-07-29 DIAGNOSIS — F99 Mental disorder, not otherwise specified: Secondary | ICD-10-CM

## 2023-07-29 DIAGNOSIS — R5382 Chronic fatigue, unspecified: Secondary | ICD-10-CM

## 2023-07-29 MED ORDER — LISDEXAMFETAMINE DIMESYLATE 30 MG PO CAPS
30.0000 mg | ORAL_CAPSULE | Freq: Every day | ORAL | 0 refills | Status: DC
Start: 2023-07-29 — End: 2023-10-18

## 2023-07-29 NOTE — Assessment & Plan Note (Signed)
Controlled, no change in medication  

## 2023-07-29 NOTE — Assessment & Plan Note (Signed)
Patient educated about the importance of limiting  Carbohydrate intake , the need to commit to daily physical activity for a minimum of 30 minutes , and to commit weight loss. The fact that changes in all these areas will reduce or eliminate all together the development of diabetes is stressed.      Latest Ref Rng & Units 07/28/2023   11:32 AM 07/26/2023    3:16 PM 07/26/2023    3:07 PM 12/23/2022    9:56 AM 11/16/2022   11:28 AM  Diabetic Labs  HbA1c 4.8 - 5.6 %   6.2  6.2    Chol 100 - 199 mg/dL     474   HDL >25 mg/dL     54   Calc LDL 0 - 99 mg/dL     96   Triglycerides 0 - 149 mg/dL     63   Creatinine 9.56 - 1.00 mg/dL 3.87  5.64    3.32       07/28/2023   10:57 AM 07/26/2023    1:58 PM 06/20/2023    9:09 AM 05/09/2023   10:16 AM 02/16/2023   11:04 AM 02/16/2023   10:05 AM 12/23/2022    9:40 AM  BP/Weight  Systolic BP 138 126  116 124 138 120  Diastolic BP 93 77  90 82 88 84  Wt. (Lbs) 317.9 318 319.2 318  311   BMI 49.79 kg/m2 49.81 kg/m2 49.99 kg/m2 49.81 kg/m2  48.71 kg/m2        No data to display          Updated lab needed .

## 2023-07-29 NOTE — Patient Instructions (Signed)
We didn't make any medication changes today. Keep up the good work!

## 2023-07-29 NOTE — Progress Notes (Signed)
Jennifer [name]     MRN: [id number]      DOB: 22-Dec-1975  Chief Complaint  Patient presents with   Pre-op Exam    Needs rolling walker- Insurance will not pay for insulin injections. Pap and AWV needs to be scheduled.    Knee Pain    Pain mainly when walking.    Incontenience    Having trouble holding urine has been having accidents at random times   Immunizations    Pt consented to Flu shot today.     HPI Jennifer [name] is here for follow up and re-evaluation of chronic medical conditions, medication management and review of any available recent lab and radiology data.  Preventive health is updated, specifically  Cancer screening and Immunization.   The PT denies any adverse reactions to current medications since the last visit.  Pre op evaluation for bariatric surgery planned for 08/09/2023  C/o urinary frequency with accidents in past 1 week Needs rolling walker for safe ambulation, due to severe osteoarthritis of the knees  ROS Denies recent fever or chills. Denies sinus pressure, nasal congestion, ear pain or sore throat. Denies chest congestion, productive cough or wheezing. Denies chest pains, palpitations and leg swelling Denies abdominal pain, nausea, vomiting,diarrhea or constipation.    Denies headaches, seizures, numbness, or tingling. Denies uncontrolled depression, anxiety or insomnia. Denies skin break down or rash.   PE  BP 126/77 (BP Location: Right Arm, Patient Position: Sitting, Cuff Size: Large)   Pulse (!) 102   Ht 5\' 7"  (1.702 m)   Wt (!) 318 lb (144.2 kg)   SpO2 94%   BMI 49.81 kg/m   Patient alert and oriented and in no cardiopulmonary distress.  HEENT: No facial asymmetry, EOMI,     Neck supple .  Chest: Clear to auscultation bilaterally.  CVS: S1, S2 no murmurs, no S3.Regular rate.  ABD: Soft non tender.   Ext: No edema  MS: decreased  ROM spine, shoulders, hips and knees.  Skin: Intact, no ulcerations or rash noted.  Psych: Good  eye contact, normal affect. Memory intact not anxious or depressed appearing.  CNS: CN 2-12 intact, power,  normal throughout.no focal deficits noted.   Assessment & Plan  Pre-op examination History and exam as documented Will be cleared after labs are reviewed Had cardiology clearance less than 12 months ago, no cardiac complaints, noi EKG in office   Dysuria Check CCUA and reflex c/s if abn  Controlled diabetes mellitus type 2 with complications [facility]) Jennifer [name] is reminded of the importance of commitment to daily physical activity for 30 minutes or more, as able and the need to limit carbohydrate intake to 30 to 60 grams per meal to help with blood sugar control.   The need to take medication as prescribed, test blood sugar as directed, and to call between visits if there is a concern that blood sugar is uncontrolled is also discussed.   Jennifer [name] is reminded of the importance of daily foot exam, annual eye examination, and good blood sugar, blood pressure and cholesterol control.     Latest Ref Rng & Units 07/28/2023   11:32 AM 07/26/2023    3:16 PM 07/26/2023    3:07 PM 12/23/2022    9:56 AM 11/16/2022   11:28 AM  Diabetic Labs  HbA1c 4.8 - 5.6 %   6.2  6.2    Chol 100 - 199 mg/dL     409   HDL >81 mg/dL  54   Calc LDL 0 - 99 mg/dL     96   Triglycerides 0 - 149 mg/dL     63   Creatinine 1.61 - 1.00 mg/dL 0.96  0.45    4.09       07/28/2023   10:57 AM 07/26/2023    1:58 PM 06/20/2023    9:09 AM 05/09/2023   10:16 AM 02/16/2023   11:04 AM 02/16/2023   10:05 AM 12/23/2022    9:40 AM  BP/Weight  Systolic BP 138 126  116 124 138 120  Diastolic BP 93 77  90 82 88 84  Wt. (Lbs) 317.9 318 319.2 318  311   BMI 49.79 kg/m2 49.81 kg/m2 49.99 kg/m2 49.81 kg/m2  48.71 kg/m2        No data to display              Morbid obesity (HCC)  Patient re-educated about  the importance of commitment to a  minimum of 150 minutes of exercise per week as able.  The importance of  healthy food choices with portion control discussed, as well as eating regularly and within a 12 hour window most days. The need to choose "clean , green" food 50 to 75% of the time is discussed, as well as to make water the primary drink and set a goal of 64 ounces water daily.       07/28/2023   10:57 AM 07/26/2023    1:58 PM 06/20/2023    9:09 AM  Weight /BMI  Weight 317 lb 14.5 oz 318 lb 319 lb 3.2 oz  Height 5\' 7"  (1.702 m) 5\' 7"  (1.702 m)   BMI 49.79 kg/m2 49.81 kg/m2 49.99 kg/m2    Has bariatric surgery oin next 2 weeks  Moderate episode of recurrent major depressive disorder (HCC) Improved on meds currently on, while recently starting with Psych  Essential hypertension Controlled, no change in medication   Generalized arthritis Generalized severe osteoarthritis affecting spine ans knees, limiting safe mobility, rolling walker to be ordered  Prediabetes Patient educated about the importance of limiting  Carbohydrate intake , the need to commit to daily physical activity for a minimum of 30 minutes , and to commit weight loss. The fact that changes in all these areas will reduce or eliminate all together the development of diabetes is stressed.      Latest Ref Rng & Units 07/28/2023   11:32 AM 07/26/2023    3:16 PM 07/26/2023    3:07 PM 12/23/2022    9:56 AM 11/16/2022   11:28 AM  Diabetic Labs  HbA1c 4.8 - 5.6 %   6.2  6.2    Chol 100 - 199 mg/dL     811   HDL >91 mg/dL     54   Calc LDL 0 - 99 mg/dL     96   Triglycerides 0 - 149 mg/dL     63   Creatinine 4.78 - 1.00 mg/dL 2.95  6.21    3.08       07/28/2023   10:57 AM 07/26/2023    1:58 PM 06/20/2023    9:09 AM 05/09/2023   10:16 AM 02/16/2023   11:04 AM 02/16/2023   10:05 AM 12/23/2022    9:40 AM  BP/Weight  Systolic BP 138 126  116 124 138 120  Diastolic BP 93 77  90 82 88 84  Wt. (Lbs) 317.9 318 319.2 318  311   BMI 49.79 kg/m2 49.81 kg/m2 49.99  kg/m2 49.81 kg/m2  48.71 kg/m2        No data to display           Updated lab needed .

## 2023-07-29 NOTE — Assessment & Plan Note (Signed)
Check CCUA and reflex c/s if abn
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
## 2023-07-29 NOTE — Assessment & Plan Note (Signed)
Jennifer [name] is reminded of the importance of commitment to daily physical activity for 30 minutes or more, as able and the need to limit carbohydrate intake to 30 to 60 grams per meal to help with blood sugar control.   The need to take medication as prescribed, test blood sugar as directed, and to call between visits if there is a concern that blood sugar is uncontrolled is also discussed.   Jennifer [name] is reminded of the importance of daily foot exam, annual eye examination, and good blood sugar, blood pressure and cholesterol control.     Latest Ref Rng & Units 07/28/2023   11:32 AM 07/26/2023    3:16 PM 07/26/2023    3:07 PM 12/23/2022    9:56 AM 11/16/2022   11:28 AM  Diabetic Labs  HbA1c 4.8 - 5.6 %   6.2  6.2    Chol 100 - 199 mg/dL     540   HDL >98 mg/dL     54   Calc LDL 0 - 99 mg/dL     96   Triglycerides 0 - 149 mg/dL     63   Creatinine 1.19 - 1.00 mg/dL 1.47  8.29    5.62       07/28/2023   10:57 AM 07/26/2023    1:58 PM 06/20/2023    9:09 AM 05/09/2023   10:16 AM 02/16/2023   11:04 AM 02/16/2023   10:05 AM 12/23/2022    9:40 AM  BP/Weight  Systolic BP 138 126  116 124 138 120  Diastolic BP 93 77  90 82 88 84  Wt. (Lbs) 317.9 318 319.2 318  311   BMI 49.79 kg/m2 49.81 kg/m2 49.99 kg/m2 49.81 kg/m2  48.71 kg/m2        No data to display

## 2023-07-29 NOTE — Assessment & Plan Note (Signed)
Generalized severe osteoarthritis affecting spine ans knees, limiting safe mobility, rolling walker to be ordered

## 2023-07-29 NOTE — Assessment & Plan Note (Signed)
Improved on meds currently on, while recently starting with Psych

## 2023-07-29 NOTE — Assessment & Plan Note (Signed)
  Patient re-educated about  the importance of commitment to a  minimum of 150 minutes of exercise per week as able.  The importance of healthy food choices with portion control discussed, as well as eating regularly and within a 12 hour window most days. The need to choose "clean , green" food 50 to 75% of the time is discussed, as well as to make water the primary drink and set a goal of 64 ounces water daily.       07/28/2023   10:57 AM 07/26/2023    1:58 PM 06/20/2023    9:09 AM  Weight /BMI  Weight 317 lb 14.5 oz 318 lb 319 lb 3.2 oz  Height 5\' 7"  (1.702 m) 5\' 7"  (1.702 m)   BMI 49.79 kg/m2 49.81 kg/m2 49.99 kg/m2    Has bariatric surgery oin next 2 weeks

## 2023-07-29 NOTE — Assessment & Plan Note (Signed)
History and exam as documented Will be cleared after labs are reviewed Had cardiology clearance less than 12 months ago, no cardiac complaints, noi EKG in office

## 2023-07-30 LAB — URINE CULTURE

## 2023-08-02 ENCOUNTER — Encounter (HOSPITAL_COMMUNITY): Payer: Self-pay | Admitting: Medical

## 2023-08-02 ENCOUNTER — Encounter (HOSPITAL_COMMUNITY): Payer: Self-pay

## 2023-08-02 MED ORDER — NITROFURANTOIN MONOHYD MACRO 100 MG PO CAPS: 100.0000 mg | ORAL_CAPSULE | Freq: Two times a day (BID) | ORAL | 0 refills | Status: DC

## 2023-08-02 NOTE — Addendum Note (Signed)
Addended by: Syliva Overman E on: 08/02/2023 12:27 PM   Modules accepted: Orders

## 2023-08-03 ENCOUNTER — Ambulatory Visit (HOSPITAL_COMMUNITY)
Admission: RE | Admit: 2023-08-03 | Discharge: 2023-08-03 | Disposition: A | Discharge status: Home / Self Care | Payer: Medicare Other | Source: Ambulatory Visit | Attending: Family Medicine | Admitting: Family Medicine

## 2023-08-03 DIAGNOSIS — Z01818 Encounter for other preprocedural examination: Secondary | ICD-10-CM | POA: Insufficient documentation

## 2023-08-03 DIAGNOSIS — I10 Essential (primary) hypertension: Secondary | ICD-10-CM | POA: Diagnosis present

## 2023-08-04 ENCOUNTER — Telehealth: Payer: Self-pay | Admitting: Dietician

## 2023-08-04 NOTE — Telephone Encounter (Signed)
Returned patients call with a question about pre-op diet, since her surgery was postponed to 05 September 2023. Pt acknowledged and expressed understanding. She will restart the pre-op (kidney shrinking) diet on 22 August 2023.  Pt agreeable to continue to practice post operation eating patterns and habits now, to prepare and create routines for success after surgery.

## 2023-08-05 ENCOUNTER — Encounter: Payer: Self-pay | Admitting: Family Medicine

## 2023-08-05 ENCOUNTER — Ambulatory Visit: Payer: Commercial Managed Care - HMO | Admitting: Family Medicine

## 2023-08-09 ENCOUNTER — Encounter (HOSPITAL_COMMUNITY): Admission: RE | Payer: Self-pay | Source: Home / Self Care

## 2023-08-09 ENCOUNTER — Inpatient Hospital Stay (HOSPITAL_COMMUNITY): Admission: RE | Admit: 2023-08-09 | Payer: Medicare Other | Source: Home / Self Care | Admitting: General Surgery

## 2023-08-09 SURGERY — LAPAROSCOPIC ROUX-EN-Y GASTRIC BYPASS WITH UPPER ENDOSCOPY
Anesthesia: General

## 2023-08-16 ENCOUNTER — Ambulatory Visit: Payer: Commercial Managed Care - HMO | Admitting: Family Medicine

## 2023-08-17 NOTE — Progress Notes (Signed)
Sent message, via epic in basket, requesting orders in epic from surgeon.  

## 2023-08-19 ENCOUNTER — Other Ambulatory Visit (HOSPITAL_COMMUNITY): Payer: Self-pay | Admitting: Psychiatry

## 2023-08-19 ENCOUNTER — Other Ambulatory Visit: Payer: Self-pay | Admitting: Family Medicine

## 2023-08-19 DIAGNOSIS — F50819 Binge eating disorder, unspecified: Secondary | ICD-10-CM

## 2023-08-19 DIAGNOSIS — F332 Major depressive disorder, recurrent severe without psychotic features: Secondary | ICD-10-CM

## 2023-08-19 DIAGNOSIS — U071 COVID-19: Secondary | ICD-10-CM

## 2023-08-19 DIAGNOSIS — F4310 Post-traumatic stress disorder, unspecified: Secondary | ICD-10-CM

## 2023-08-19 DIAGNOSIS — F410 Panic disorder [episodic paroxysmal anxiety] without agoraphobia: Secondary | ICD-10-CM

## 2023-08-22 ENCOUNTER — Ambulatory Visit: Payer: Self-pay | Admitting: General Surgery

## 2023-08-22 DIAGNOSIS — E876 Hypokalemia: Secondary | ICD-10-CM

## 2023-08-22 DIAGNOSIS — I10 Essential (primary) hypertension: Secondary | ICD-10-CM

## 2023-08-22 NOTE — Progress Notes (Signed)
Second request for pre op orders in [facility]: Left a voicemail on nurse line.

## 2023-08-23 ENCOUNTER — Ambulatory Visit: Payer: Commercial Managed Care - HMO

## 2023-08-23 NOTE — Progress Notes (Addendum)
COVID Vaccine received:  []  No [x]  Yes Date of any COVID positive Test in last 90 days:  none   PCP - Syliva Overman, MD  Cardiologist - Carolan Clines, MD   Chest x-ray - 08-03-2023   2v  Epic   EKG -  07-28-2023   Epic Stress Test -  ECHO -  Cardiac Cath -    PCR screen: []  Ordered & Completed           []   No Order but Needs PROFEND           [x]   N/A for this surgery   Surgery Plan:  []  Ambulatory   []  Outpatient in bed  [x]  Admit   Anesthesia:    [x]  General  []  Spinal  []   Choice []   MAC   Bowel Prep - [x]  No  []   Yes _Bariatric diet etc._____   Pacemaker / ICD device [x]  No []  Yes   Spinal Cord Stimulator:[x]  No []  Yes       History of Sleep Apnea? []  No [x]  Yes   Mild OSA CPAP used?- [x]  No []  Yes     Does the patient monitor blood sugar?  []  No [x]  Yes  []  N/A   Patient has: []  NO Hx DM   [x]  Pre-DM   []  DM1  []   DM2 Last A1c was: 6.2 on  07-26-23    Does patient have a [employer] or Dexacom? [x]  No []  Yes   Fasting Blood Sugar Ranges- 100-137 Checks Blood Sugar _1_ times a day   Blood Thinner / Instructions:  none Aspirin Instructions:  none   ERAS Protocol Ordered: []  No  [x]  Yes PRE-SURGERY []  ENSURE  [x]  G2   []  No Drink Ordered Patient is to be NPO after:   0430   Comments: Surgery was rescheduled from  08-09-2023 to 09-05-2023 d/t Dr. Andrey Campanile having a family emergency   Activity level: Patient is unable to climb a flight of stairs without difficulty; [x]  No CP  but would have leg, back pain and SOB.   Patient can perform ADLs without assistance.    Anesthesia review: HTN, GAD, MDD, PTSD, Hx Tuberculosis( dx 2000-2004? Tx with meds- always positive), Pre-DM, mild OSA- No CPAP, GERD   Patient denies shortness of breath, fever, cough and chest pain at PAT appointment.   Patient verbalized understanding and agreement to the Pre-Surgical Instructions that were given to them at this PAT appointment. Patient was also educated of the need to review these PAT  instructions again prior to her surgery.I reviewed the appropriate phone numbers to call if they have any and questions or concerns.

## 2023-08-23 NOTE — Patient Instructions (Signed)
SURGICAL WAITING ROOM VISITATION Patients having surgery or a procedure may have no more than 2 support people in the waiting area - these visitors may rotate in the visitor waiting room.   Due to an increase in RSV and influenza rates and associated hospitalizations, children ages 70 and under may not visit patients in [facility] hospitals. If the patient needs to stay at the hospital during part of their recovery, the visitor guidelines for inpatient rooms apply.   PRE-OP VISITATION  Pre-op nurse will coordinate an appropriate time for 1 support person to accompany the patient in pre-op.  This support person may not rotate.  This visitor will be contacted when the time is appropriate for the visitor to come back in the pre-op area.   Please refer to the [facility] website for the visitor guidelines for Inpatients (after your surgery is over and you are in a regular room).   You are not required to quarantine at this time prior to your surgery. However, you must do this: Hand Hygiene often Do NOT share personal items Notify your provider if you are in close contact with someone who has COVID or you develop fever 100.4 or greater, new onset of sneezing, cough, sore throat, shortness of breath or body aches.  If you test positive for Covid or have been in contact with anyone that has tested positive in the last 10 days please notify you surgeon.     Your procedure is scheduled on:  Monday  September 05, 2023   Report to [facility] Main Entrance: [facility] entrance where the [employer] is available.    Report to admitting at:  05:15   AM   Call this number if you have any questions or problems the morning of surgery [phone]   Do not eat food after Midnight the night prior to your surgery/procedure.   After Midnight you may have the following liquids until   04:30  AM DAY OF SURGERY   Clear Liquid Diet Water Black Coffee (sugar ok, NO MILK/CREAM OR CREAMERS)  Tea (sugar  ok, NO MILK/CREAM OR CREAMERS) regular and decaf                             Plain Jell-O  with no fruit (NO RED)                                           Fruit ices (not with fruit pulp, NO RED)                                     Popsicles (NO RED)                                                                  Juice: NO CITRUS JUICES: only apple, WHITE grape, WHITE cranberry Sports drinks like Gatorade or Powerade (NO RED)                         The day  of surgery:  Drink ONE (1) Pre-Surgery G2 at 04:30  AM the morning of surgery. Drink in one sitting. Do not sip.  This drink was given to you during your hospital pre-op appointment visit. Nothing else to drink after completing the Pre-Surgery G2 : No candy, chewing gum or throat lozenges.     FOLLOW BOWEL PREP AND ANY ADDITIONAL PRE OP INSTRUCTIONS YOU RECEIVED FROM YOUR SURGEON'S OFFICE!!!    Oral Hygiene is also important to reduce your risk of infection.        Remember - BRUSH YOUR TEETH THE MORNING OF SURGERY WITH YOUR REGULAR TOOTHPASTE   Do NOT smoke after Midnight the night before surgery.   STOP TAKING all Vitamins, Herbs and supplements 1 week before your surgery.    Take ONLY these medicines the morning of surgery with A SIP OF WATER: Amlodipine, Fluoxetine (Prozac),   you may use your Flonase Nasal spray if needed.                     You may not have any metal on your body including hair pins, jewelry, and body piercing   Do not wear make-up, lotions, powders, perfumes , or deodorant   Do not wear nail polish including gel and S&S, artificial / acrylic nails, or any other type of covering on natural nails including finger and toenails. If you have artificial nails, gel coating, etc., that needs to be removed by a nail salon, Please have this removed prior to surgery. Not doing so may mean that your surgery could be cancelled or delayed if the Surgeon or anesthesia staff feels like they are unable to monitor you  safely.    Do not shave 48 hours prior to surgery to avoid nicks in your skin which may contribute to postoperative infections.      Contacts, Hearing Aids, dentures or bridgework may not be worn into surgery. DENTURES WILL BE REMOVED PRIOR TO SURGERY PLEASE DO NOT APPLY "Poly grip" OR ADHESIVES!!!   You may bring a small overnight bag with you on the day of surgery, only pack items that are not valuable. [facility] IS NOT RESPONSIBLE   FOR VALUABLES THAT ARE LOST OR STOLEN.    Do not bring your home medications to the hospital. The Pharmacy will dispense medications listed on your medication list to you during your admission in the Hospital.   Please read over the following fact sheets you were given: IF YOU HAVE QUESTIONS ABOUT YOUR PRE-OP INSTRUCTIONS, PLEASE CALL [phone]     [facility] - Preparing for Surgery Before surgery, you can play an important role.  Because skin is not sterile, your skin needs to be as free of germs as possible.  You can reduce the number of germs on your skin by washing with CHG (chlorahexidine gluconate) soap before surgery.  CHG is an antiseptic cleaner which kills germs and bonds with the skin to continue killing germs even after washing. Please DO NOT use if you have an allergy to CHG or antibacterial soaps.  If your skin becomes reddened/irritated stop using the CHG and inform your nurse when you arrive at Short Stay. Do not shave (including legs and underarms) for at least 48 hours prior to the first CHG shower.  You may shave your face/neck.   Please follow these instructions carefully:             1.  Shower with CHG Soap the night before surgery and the  morning of surgery.             2.  If you choose to wash your hair, wash your hair first as usual with your normal  shampoo.             3.  After you shampoo, rinse your hair and body thoroughly to remove the shampoo.                                              4.  Use CHG as you would any other  liquid soap.  You can apply chg directly to the skin and wash.  Gently with a scrungie or clean washcloth.             5.  Apply the CHG Soap to your body ONLY FROM THE NECK DOWN.   Do not use on face/ open                           Wound or open sores. Avoid contact with eyes, ears mouth and genitals (private parts).                       Wash face,  Genitals (private parts) with your normal soap.             6.  Wash thoroughly, paying special attention to the area where your surgery  will be performed.             7.  Thoroughly rinse your body with warm water from the neck down.             8.  DO NOT shower/wash with your normal soap after using and rinsing off the CHG Soap.            9.  Pat yourself dry with a clean towel.            10.  Wear clean pajamas.            11.  Place clean sheets on your bed the night of your first shower and do not  sleep with pets.   ON THE DAY OF SURGERY : Do not apply any lotions/deodorants the morning of surgery.  Please wear clean clothes to the hospital/surgery center.         FAILURE TO FOLLOW THESE INSTRUCTIONS MAY RESULT IN THE CANCELLATION OF YOUR SURGERY   PATIENT SIGNATURE_________________________________   NURSE SIGNATURE__________________________________   ________________________________________________________________________          Jennifer [name]      An incentive spirometer is a tool that can help keep your lungs clear and active. This tool measures how well you are filling your lungs with each breath. Taking long deep breaths may help reverse or decrease the chance of developing breathing (pulmonary) problems (especially infection) following: A long period of time when you are unable to move or be active. BEFORE THE PROCEDURE  If the spirometer includes an indicator to show your best effort, your nurse or respiratory therapist will set it to a desired goal. If possible, sit up straight or lean slightly forward. Try  not to slouch. Hold the incentive spirometer in an upright position. INSTRUCTIONS FOR USE  Sit on the edge of your bed if possible, or sit up as far as you can  in bed or on a chair. Hold the incentive spirometer in an upright position. Breathe out normally. Place the mouthpiece in your mouth and seal your lips tightly around it. Breathe in slowly and as deeply as possible, raising the piston or the ball toward the top of the column. Hold your breath for 3-5 seconds or for as long as possible. Allow the piston or ball to fall to the bottom of the column. Remove the mouthpiece from your mouth and breathe out normally. Rest for a few seconds and repeat Steps 1 through 7 at least 10 times every 1-2 hours when you are awake. Take your time and take a few normal breaths between deep breaths. The spirometer may include an indicator to show your best effort. Use the indicator as a goal to work toward during each repetition. After each set of 10 deep breaths, practice coughing to be sure your lungs are clear. If you have an incision (the cut made at the time of surgery), support your incision when coughing by placing a pillow or rolled up towels firmly against it. Once you are able to get out of bed, walk around indoors and cough well. You may stop using the incentive spirometer when instructed by your caregiver.  RISKS AND COMPLICATIONS Take your time so you do not get dizzy or light-headed. If you are in pain, you may need to take or ask for pain medication before doing incentive spirometry. It is harder to take a deep breath if you are having pain. AFTER USE Rest and breathe slowly and easily. It can be helpful to keep track of a log of your progress. Your caregiver can provide you with a simple table to help with this. If you are using the spirometer at home, follow these instructions: SEEK MEDICAL CARE IF:  You are having difficultly using the spirometer. You have trouble using the spirometer as  often as instructed. Your pain medication is not giving enough relief while using the spirometer. You develop fever of 100.5 F (38.1 C) or higher.                                                                                                    SEEK IMMEDIATE MEDICAL CARE IF:  You cough up bloody sputum that had not been present before. You develop fever of 102 F (38.9 C) or greater. You develop worsening pain at or near the incision site. MAKE SURE YOU:  Understand these instructions. Will watch your condition. Will get help right away if you are not doing well or get worse. Document Released: 03/14/2007 Document Revised: 01/24/2012 Document Reviewed: 05/15/2007 [facility] Patient Information 2014 [place], [place].

## 2023-08-24 ENCOUNTER — Encounter (HOSPITAL_COMMUNITY): Payer: Self-pay | Admitting: *Deleted

## 2023-08-24 ENCOUNTER — Other Ambulatory Visit: Payer: Self-pay

## 2023-08-24 ENCOUNTER — Encounter (HOSPITAL_COMMUNITY)
Admission: RE | Admit: 2023-08-24 | Discharge: 2023-08-24 | Disposition: A | Discharge status: Home / Self Care | Payer: Medicare Other | Source: Ambulatory Visit | Attending: General Surgery | Admitting: General Surgery

## 2023-08-24 VITALS — BP 130/81 | HR 88 | Temp 97.6°F | Resp 22 | Ht 67.0 in | Wt 318.0 lb

## 2023-08-24 DIAGNOSIS — E876 Hypokalemia: Secondary | ICD-10-CM | POA: Diagnosis not present

## 2023-08-24 DIAGNOSIS — Z01818 Encounter for other preprocedural examination: Secondary | ICD-10-CM | POA: Diagnosis present

## 2023-08-24 DIAGNOSIS — Z01812 Encounter for preprocedural laboratory examination: Secondary | ICD-10-CM | POA: Insufficient documentation

## 2023-08-24 DIAGNOSIS — Z6841 Body Mass Index (BMI) 40.0 and over, adult: Secondary | ICD-10-CM | POA: Insufficient documentation

## 2023-08-24 HISTORY — DX: Sleep apnea, unspecified: G47.30

## 2023-08-24 LAB — CBC WITH DIFFERENTIAL/PLATELET
Abs Immature Granulocytes: 0.01 10*3/uL (ref 0.00–0.07)
Basophils Absolute: 0 10*3/uL (ref 0.0–0.1)
Basophils Relative: 1 %
Eosinophils Absolute: 0.2 10*3/uL (ref 0.0–0.5)
Eosinophils Relative: 3 %
HCT: 40 % (ref 36.0–46.0)
Hemoglobin: 13.3 g/dL (ref 12.0–15.0)
Immature Granulocytes: 0 %
Lymphocytes Relative: 35 %
Lymphs Abs: 2.6 10*3/uL (ref 0.7–4.0)
MCH: 30.9 pg (ref 26.0–34.0)
MCHC: 33.3 g/dL (ref 30.0–36.0)
MCV: 92.8 fL (ref 80.0–100.0)
Monocytes Absolute: 0.5 10*3/uL (ref 0.1–1.0)
Monocytes Relative: 6 %
Neutro Abs: 4.3 10*3/uL (ref 1.7–7.7)
Neutrophils Relative %: 55 %
Platelets: 365 10*3/uL (ref 150–400)
RBC: 4.31 MIL/uL (ref 3.87–5.11)
RDW: 13.9 % (ref 11.5–15.5)
WBC: 7.6 10*3/uL (ref 4.0–10.5)
nRBC: 0 % (ref 0.0–0.2)

## 2023-08-24 LAB — COMPREHENSIVE METABOLIC PANEL
ALT: 16 U/L (ref 0–44)
AST: 14 U/L — ABNORMAL LOW (ref 15–41)
Albumin: 4.2 g/dL (ref 3.5–5.0)
Alkaline Phosphatase: 70 U/L (ref 38–126)
Anion gap: 11 (ref 5–15)
BUN: 17 mg/dL (ref 6–20)
CO2: 22 mmol/L (ref 22–32)
Calcium: 9.5 mg/dL (ref 8.9–10.3)
Chloride: 105 mmol/L (ref 98–111)
Creatinine, Ser: 0.84 mg/dL (ref 0.44–1.00)
GFR, Estimated: >60 mL/min (ref >60)
Glucose, Bld: 83 mg/dL (ref 70–99)
Potassium: 3.3 mmol/L — ABNORMAL LOW (ref 3.5–5.1)
Sodium: 138 mmol/L (ref 135–145)
Total Bilirubin: 0.8 mg/dL (ref 0.3–1.2)
Total Protein: 7.6 g/dL (ref 6.5–8.1)

## 2023-08-24 LAB — TYPE AND SCREEN
ABO/RH(D): O POS
Antibody Screen: NEGATIVE

## 2023-08-26 ENCOUNTER — Telehealth (HOSPITAL_COMMUNITY): Payer: Medicare Other | Admitting: Psychiatry

## 2023-09-04 NOTE — Anesthesia Preprocedure Evaluation (Addendum)
Anesthesia Evaluation  Patient identified by MRN, date of birth, ID band Patient awake    Reviewed: Allergy & Precautions, NPO status , Patient's Chart, lab work & pertinent test results  History of Anesthesia Complications Negative for: history of anesthetic complications  Airway Mallampati: IV  TM Distance: >3 FB Neck ROM: Full  Mouth opening: Limited Mouth Opening  Dental  (+) Dental Advisory Given Missing some teeth on the sides on the top and bottom. Unable to see how many due to patient's small mouth opening. Denies loose teeth.:   Pulmonary neg shortness of breath, sleep apnea (mild OSA, no CPAP) , neg COPD, neg recent URI   Pulmonary exam normal breath sounds clear to auscultation       Cardiovascular hypertension, Pt. on medications (-) angina (-) Past MI, (-) Cardiac Stents and (-) CABG (-) dysrhythmias  Rhythm:Regular Rate:Normal     Neuro/Psych  PSYCHIATRIC DISORDERS Anxiety Depression    negative neurological ROS     GI/Hepatic Neg liver ROS,GERD  ,,  Endo/Other  diabetes (prediabetic)  Morbid obesityBMI 50  Renal/GU negative Renal ROS  negative genitourinary   Musculoskeletal  (+) Arthritis , Osteoarthritis,    Abdominal  (+) + obese  Peds  Hematology negative hematology ROS (+) Hb 13.3   Anesthesia Other Findings   Reproductive/Obstetrics negative OB ROS                             Anesthesia Physical Anesthesia Plan  ASA: 3  Anesthesia Plan: General   Post-op Pain Management: Tylenol PO (pre-op)*   Induction: Intravenous  PONV Risk Score and Plan: 4 or greater and Ondansetron, Dexamethasone, Midazolam, Scopolamine patch - Pre-op, Treatment may vary due to age or medical condition and Aprepitant  Airway Management Planned: Oral ETT and Video Laryngoscope Planned  Additional Equipment: None  Intra-op Plan:   Post-operative Plan: Extubation in OR  Informed  Consent: I have reviewed the patients History and Physical, chart, labs and discussed the procedure including the risks, benefits and alternatives for the proposed anesthesia with the patient or authorized representative who has indicated his/her understanding and acceptance.     Dental advisory given  Plan Discussed with: CRNA and Anesthesiologist  Anesthesia Plan Comments: (Risks of general anesthesia discussed including, but not limited to, sore throat, hoarse voice, chipped/damaged teeth, injury to vocal cords, nausea and vomiting, allergic reactions, lung infection, heart attack, stroke, and death. All questions answered. )       Anesthesia Quick Evaluation

## 2023-09-05 ENCOUNTER — Other Ambulatory Visit: Payer: Self-pay

## 2023-09-05 ENCOUNTER — Encounter (HOSPITAL_COMMUNITY): Payer: Self-pay | Admitting: General Surgery

## 2023-09-05 ENCOUNTER — Encounter (HOSPITAL_COMMUNITY)
Admission: RE | Disposition: A | Discharge status: Home / Self Care | Payer: Self-pay | Source: Home / Self Care | Attending: General Surgery

## 2023-09-05 ENCOUNTER — Inpatient Hospital Stay (HOSPITAL_COMMUNITY): Payer: Medicare Other | Admitting: Anesthesiology

## 2023-09-05 ENCOUNTER — Inpatient Hospital Stay (HOSPITAL_COMMUNITY): Payer: Self-pay | Admitting: Anesthesiology

## 2023-09-05 ENCOUNTER — Inpatient Hospital Stay (HOSPITAL_COMMUNITY)
Admission: RE | Admit: 2023-09-05 | Discharge: 2023-09-06 | DRG: 621 | Disposition: A | Discharge status: Home / Self Care | Payer: Medicare Other | Attending: General Surgery | Admitting: General Surgery

## 2023-09-05 DIAGNOSIS — F4310 Post-traumatic stress disorder, unspecified: Secondary | ICD-10-CM | POA: Diagnosis present

## 2023-09-05 DIAGNOSIS — Z9884 Bariatric surgery status: Secondary | ICD-10-CM | POA: Diagnosis present

## 2023-09-05 DIAGNOSIS — R7303 Prediabetes: Secondary | ICD-10-CM | POA: Diagnosis present

## 2023-09-05 DIAGNOSIS — Z87440 Personal history of urinary (tract) infections: Secondary | ICD-10-CM

## 2023-09-05 DIAGNOSIS — Z8249 Family history of ischemic heart disease and other diseases of the circulatory system: Secondary | ICD-10-CM | POA: Diagnosis not present

## 2023-09-05 DIAGNOSIS — Z833 Family history of diabetes mellitus: Secondary | ICD-10-CM

## 2023-09-05 DIAGNOSIS — G8929 Other chronic pain: Secondary | ICD-10-CM | POA: Diagnosis present

## 2023-09-05 DIAGNOSIS — Z803 Family history of malignant neoplasm of breast: Secondary | ICD-10-CM

## 2023-09-05 DIAGNOSIS — M170 Bilateral primary osteoarthritis of knee: Secondary | ICD-10-CM | POA: Diagnosis present

## 2023-09-05 DIAGNOSIS — Z800 Family history of malignant neoplasm of digestive organs: Secondary | ICD-10-CM

## 2023-09-05 DIAGNOSIS — M545 Low back pain, unspecified: Secondary | ICD-10-CM | POA: Diagnosis present

## 2023-09-05 DIAGNOSIS — Z8611 Personal history of tuberculosis: Secondary | ICD-10-CM | POA: Diagnosis not present

## 2023-09-05 DIAGNOSIS — G4733 Obstructive sleep apnea (adult) (pediatric): Secondary | ICD-10-CM | POA: Diagnosis present

## 2023-09-05 DIAGNOSIS — K5730 Diverticulosis of large intestine without perforation or abscess without bleeding: Secondary | ICD-10-CM | POA: Diagnosis present

## 2023-09-05 DIAGNOSIS — Z6841 Body Mass Index (BMI) 40.0 and over, adult: Secondary | ICD-10-CM | POA: Diagnosis not present

## 2023-09-05 DIAGNOSIS — K219 Gastro-esophageal reflux disease without esophagitis: Secondary | ICD-10-CM | POA: Diagnosis present

## 2023-09-05 DIAGNOSIS — Z01818 Encounter for other preprocedural examination: Principal | ICD-10-CM

## 2023-09-05 DIAGNOSIS — I10 Essential (primary) hypertension: Secondary | ICD-10-CM | POA: Diagnosis present

## 2023-09-05 HISTORY — PX: GASTRIC ROUX-EN-Y: SHX5262

## 2023-09-05 LAB — POCT PREGNANCY, URINE: Preg Test, Ur: NEGATIVE

## 2023-09-05 LAB — HEMOGLOBIN AND HEMATOCRIT, BLOOD
HCT: 38.8 % (ref 36.0–46.0)
Hemoglobin: 13 g/dL (ref 12.0–15.0)

## 2023-09-05 SURGERY — LAPAROSCOPIC ROUX-EN-Y GASTRIC BYPASS WITH UPPER ENDOSCOPY
Anesthesia: General

## 2023-09-05 MED ORDER — BUPIVACAINE LIPOSOME 1.3 % IJ SUSP
INTRAMUSCULAR | Status: AC
  Filled 2023-09-05: qty 20

## 2023-09-05 MED ORDER — SIMETHICONE 80 MG PO CHEW: 80.0000 mg | CHEWABLE_TABLET | Freq: Four times a day (QID) | ORAL | Status: DC | PRN

## 2023-09-05 MED ORDER — DEXAMETHASONE SODIUM PHOSPHATE 10 MG/ML IJ SOLN
INTRAMUSCULAR | Status: AC
  Filled 2023-09-05: qty 1

## 2023-09-05 MED ORDER — AMISULPRIDE (ANTIEMETIC) 5 MG/2ML IV SOLN: 10.0000 mg | Freq: Once | INTRAVENOUS | Status: DC | PRN

## 2023-09-05 MED ORDER — SODIUM CHLORIDE 0.9 % IV SOLN
2.0000 g | INTRAVENOUS | Status: AC
  Administered 2023-09-05: 2 g via INTRAVENOUS
  Filled 2023-09-05: qty 2

## 2023-09-05 MED ORDER — CHLORHEXIDINE GLUCONATE 4 % EX SOLN: Freq: Once | CUTANEOUS | Status: DC

## 2023-09-05 MED ORDER — SCOPOLAMINE 1 MG/3DAYS TD PT72
1.0000 | MEDICATED_PATCH | TRANSDERMAL | Status: DC
  Administered 2023-09-05: 1.5 mg via TRANSDERMAL
  Filled 2023-09-05: qty 1

## 2023-09-05 MED ORDER — ROCURONIUM BROMIDE 10 MG/ML (PF) SYRINGE
PREFILLED_SYRINGE | INTRAVENOUS | Status: AC
  Filled 2023-09-05: qty 10

## 2023-09-05 MED ORDER — 0.9 % SODIUM CHLORIDE (POUR BTL) OPTIME
TOPICAL | Status: DC | PRN
Start: 2023-09-05 — End: 2023-09-05
  Administered 2023-09-05: 1000 mL

## 2023-09-05 MED ORDER — GABAPENTIN 100 MG PO CAPS
100.0000 mg | ORAL_CAPSULE | ORAL | Status: AC
  Administered 2023-09-05: 100 mg via ORAL
  Filled 2023-09-05: qty 1

## 2023-09-05 MED ORDER — CHLORHEXIDINE GLUCONATE 0.12 % MT SOLN
15.0000 mL | Freq: Once | OROMUCOSAL | Status: AC
  Administered 2023-09-05: 15 mL via OROMUCOSAL

## 2023-09-05 MED ORDER — PANTOPRAZOLE SODIUM 40 MG IV SOLR
40.0000 mg | Freq: Every day | INTRAVENOUS | Status: DC
  Administered 2023-09-05: 40 mg via INTRAVENOUS
  Filled 2023-09-05: qty 10

## 2023-09-05 MED ORDER — BUPIVACAINE LIPOSOME 1.3 % IJ SUSP: 20.0000 mL | Freq: Once | INTRAMUSCULAR | Status: DC

## 2023-09-05 MED ORDER — KETAMINE HCL 50 MG/5ML IJ SOSY
PREFILLED_SYRINGE | INTRAMUSCULAR | Status: AC
  Filled 2023-09-05: qty 5

## 2023-09-05 MED ORDER — PROPOFOL 10 MG/ML IV BOLUS
INTRAVENOUS | Status: AC
  Filled 2023-09-05: qty 20

## 2023-09-05 MED ORDER — AMLODIPINE BESYLATE 5 MG PO TABS
2.5000 mg | ORAL_TABLET | Freq: Every day | ORAL | Status: DC
  Administered 2023-09-06: 2.5 mg via ORAL
  Filled 2023-09-05: qty 1

## 2023-09-05 MED ORDER — SODIUM CHLORIDE 0.9 % IV SOLN: 12.5000 mg | Freq: Four times a day (QID) | INTRAVENOUS | Status: DC | PRN

## 2023-09-05 MED ORDER — ROCURONIUM BROMIDE 10 MG/ML (PF) SYRINGE
PREFILLED_SYRINGE | INTRAVENOUS | Status: DC | PRN
  Administered 2023-09-05: 20 mg via INTRAVENOUS
  Administered 2023-09-05: 80 mg via INTRAVENOUS

## 2023-09-05 MED ORDER — FLUTICASONE PROPIONATE 50 MCG/ACT NA SUSP: 2.0000 | Freq: Every day | NASAL | Status: DC | PRN

## 2023-09-05 MED ORDER — OXYCODONE HCL 5 MG PO TABS: 5.0000 mg | ORAL_TABLET | Freq: Once | ORAL | Status: DC | PRN

## 2023-09-05 MED ORDER — AMLODIPINE BESYLATE 5 MG PO TABS
5.0000 mg | ORAL_TABLET | Freq: Every day | ORAL | Status: DC
  Administered 2023-09-06: 5 mg via ORAL
  Filled 2023-09-05: qty 1

## 2023-09-05 MED ORDER — ACETAMINOPHEN 160 MG/5ML PO SOLN: 1000.0000 mg | Freq: Three times a day (TID) | ORAL | Status: DC

## 2023-09-05 MED ORDER — ONDANSETRON HCL 4 MG/2ML IJ SOLN: 4.0000 mg | Freq: Four times a day (QID) | INTRAMUSCULAR | Status: DC | PRN

## 2023-09-05 MED ORDER — HYDRALAZINE HCL 20 MG/ML IJ SOLN: 10.0000 mg | INTRAMUSCULAR | Status: DC | PRN

## 2023-09-05 MED ORDER — ACETAMINOPHEN 500 MG PO TABS
1000.0000 mg | ORAL_TABLET | Freq: Three times a day (TID) | ORAL | Status: DC
  Administered 2023-09-05 – 2023-09-06 (×4): 1000 mg via ORAL
  Filled 2023-09-05 (×4): qty 2

## 2023-09-05 MED ORDER — PROPOFOL 10 MG/ML IV BOLUS
INTRAVENOUS | Status: DC | PRN
  Administered 2023-09-05: 200 mg via INTRAVENOUS

## 2023-09-05 MED ORDER — ENSURE MAX PROTEIN PO LIQD
2.0000 [oz_av] | ORAL | Status: DC
  Administered 2023-09-06 (×6): 2 [oz_av] via ORAL

## 2023-09-05 MED ORDER — MIDAZOLAM HCL 5 MG/5ML IJ SOLN
INTRAMUSCULAR | Status: DC | PRN
  Administered 2023-09-05: 2 mg via INTRAVENOUS

## 2023-09-05 MED ORDER — FENTANYL CITRATE PF 50 MCG/ML IJ SOSY
25.0000 ug | PREFILLED_SYRINGE | INTRAMUSCULAR | Status: DC | PRN
  Administered 2023-09-05: 50 ug via INTRAVENOUS

## 2023-09-05 MED ORDER — MIDAZOLAM HCL 2 MG/2ML IJ SOLN
INTRAMUSCULAR | Status: AC
  Filled 2023-09-05: qty 2

## 2023-09-05 MED ORDER — OXYCODONE HCL 5 MG/5ML PO SOLN: 5.0000 mg | Freq: Once | ORAL | Status: DC | PRN

## 2023-09-05 MED ORDER — FENTANYL CITRATE (PF) 100 MCG/2ML IJ SOLN
INTRAMUSCULAR | Status: DC | PRN
  Administered 2023-09-05: 50 ug via INTRAVENOUS
  Administered 2023-09-05: 100 ug via INTRAVENOUS

## 2023-09-05 MED ORDER — MORPHINE SULFATE (PF) 2 MG/ML IV SOLN
1.0000 mg | INTRAVENOUS | Status: DC | PRN
Start: 2023-09-05 — End: 2023-09-06

## 2023-09-05 MED ORDER — LACTATED RINGERS IR SOLN
Status: DC | PRN
Start: 2023-09-05 — End: 2023-09-05
  Administered 2023-09-05: 1000 mL

## 2023-09-05 MED ORDER — GABAPENTIN 100 MG PO CAPS
300.0000 mg | ORAL_CAPSULE | Freq: Every day | ORAL | Status: DC
  Administered 2023-09-05: 300 mg via ORAL
  Filled 2023-09-05: qty 3

## 2023-09-05 MED ORDER — HEPARIN SODIUM (PORCINE) 5000 UNIT/ML IJ SOLN
5000.0000 [IU] | INTRAMUSCULAR | Status: AC
  Administered 2023-09-05: 5000 [IU] via SUBCUTANEOUS
  Filled 2023-09-05: qty 1

## 2023-09-05 MED ORDER — ONDANSETRON HCL 4 MG/2ML IJ SOLN
INTRAMUSCULAR | Status: AC
  Filled 2023-09-05: qty 2

## 2023-09-05 MED ORDER — OXYCODONE HCL 5 MG/5ML PO SOLN
5.0000 mg | Freq: Four times a day (QID) | ORAL | Status: DC | PRN
Start: 2023-09-05 — End: 2023-09-06
  Administered 2023-09-05 – 2023-09-06 (×2): 5 mg via ORAL
  Filled 2023-09-05 (×2): qty 5

## 2023-09-05 MED ORDER — BUPIVACAINE-EPINEPHRINE 0.25% -1:200000 IJ SOLN
INTRAMUSCULAR | Status: DC | PRN
  Administered 2023-09-05: 30 mL

## 2023-09-05 MED ORDER — DEXMEDETOMIDINE HCL IN NACL 80 MCG/20ML IV SOLN
INTRAVENOUS | Status: DC | PRN
  Administered 2023-09-05: 8 ug via INTRAVENOUS

## 2023-09-05 MED ORDER — LIDOCAINE HCL (PF) 2 % IJ SOLN
INTRAMUSCULAR | Status: DC | PRN
  Administered 2023-09-05: 1.5 mg/kg/h via INTRADERMAL

## 2023-09-05 MED ORDER — ORAL CARE MOUTH RINSE: 15.0000 mL | Freq: Once | OROMUCOSAL | Status: AC

## 2023-09-05 MED ORDER — PHENYLEPHRINE 80 MCG/ML (10ML) SYRINGE FOR IV PUSH (FOR BLOOD PRESSURE SUPPORT)
PREFILLED_SYRINGE | INTRAVENOUS | Status: AC
  Filled 2023-09-05: qty 10

## 2023-09-05 MED ORDER — STERILE WATER FOR IRRIGATION IR SOLN
Status: DC | PRN
  Administered 2023-09-05: 1000 mL

## 2023-09-05 MED ORDER — DEXAMETHASONE SODIUM PHOSPHATE 4 MG/ML IJ SOLN
4.0000 mg | INTRAMUSCULAR | Status: AC
  Administered 2023-09-05 (×2): 5 mg via INTRAVENOUS

## 2023-09-05 MED ORDER — FIBRIN SEALANT 2 ML SINGLE DOSE KIT
2.0000 mL | PACK | Freq: Once | CUTANEOUS | Status: AC
  Administered 2023-09-05: 2 mL via TOPICAL
  Filled 2023-09-05: qty 2

## 2023-09-05 MED ORDER — LACTATED RINGERS IV SOLN: INTRAVENOUS | Status: DC

## 2023-09-05 MED ORDER — DEXMEDETOMIDINE HCL IN NACL 80 MCG/20ML IV SOLN
INTRAVENOUS | Status: AC
  Filled 2023-09-05: qty 20

## 2023-09-05 MED ORDER — FENTANYL CITRATE (PF) 250 MCG/5ML IJ SOLN
INTRAMUSCULAR | Status: AC
  Filled 2023-09-05: qty 5

## 2023-09-05 MED ORDER — FLUOXETINE HCL 20 MG PO CAPS
60.0000 mg | ORAL_CAPSULE | Freq: Every day | ORAL | Status: DC
  Administered 2023-09-06: 60 mg via ORAL
  Filled 2023-09-05: qty 3

## 2023-09-05 MED ORDER — LIDOCAINE HCL 2 % IJ SOLN
INTRAMUSCULAR | Status: AC
  Filled 2023-09-05: qty 20

## 2023-09-05 MED ORDER — LIDOCAINE 2% (20 MG/ML) 5 ML SYRINGE
INTRAMUSCULAR | Status: DC | PRN
  Administered 2023-09-05: 100 mg via INTRAVENOUS

## 2023-09-05 MED ORDER — ONDANSETRON HCL 4 MG/2ML IJ SOLN
INTRAMUSCULAR | Status: DC | PRN
  Administered 2023-09-05: 4 mg via INTRAVENOUS

## 2023-09-05 MED ORDER — BUPIVACAINE-EPINEPHRINE 0.25% -1:200000 IJ SOLN
INTRAMUSCULAR | Status: AC
  Filled 2023-09-05: qty 1

## 2023-09-05 MED ORDER — SUGAMMADEX SODIUM 200 MG/2ML IV SOLN
INTRAVENOUS | Status: DC | PRN
  Administered 2023-09-05: 200 mg via INTRAVENOUS
  Administered 2023-09-05: 100 mg via INTRAVENOUS

## 2023-09-05 MED ORDER — PHENYLEPHRINE HCL-NACL 20-0.9 MG/250ML-% IV SOLN
INTRAVENOUS | Status: DC | PRN
  Administered 2023-09-05: 40 ug/min via INTRAVENOUS

## 2023-09-05 MED ORDER — KETAMINE HCL 10 MG/ML IJ SOLN
INTRAMUSCULAR | Status: DC | PRN
  Administered 2023-09-05: 30 mg via INTRAVENOUS

## 2023-09-05 MED ORDER — HEPARIN SODIUM (PORCINE) 5000 UNIT/ML IJ SOLN
5000.0000 [IU] | Freq: Three times a day (TID) | INTRAMUSCULAR | Status: DC
  Administered 2023-09-05 – 2023-09-06 (×3): 5000 [IU] via SUBCUTANEOUS
  Filled 2023-09-05 (×3): qty 1

## 2023-09-05 MED ORDER — APREPITANT 40 MG PO CAPS
40.0000 mg | ORAL_CAPSULE | ORAL | Status: AC
  Administered 2023-09-05: 40 mg via ORAL
  Filled 2023-09-05: qty 1

## 2023-09-05 MED ORDER — FENTANYL CITRATE PF 50 MCG/ML IJ SOSY
PREFILLED_SYRINGE | INTRAMUSCULAR | Status: AC
  Filled 2023-09-05: qty 2

## 2023-09-05 MED ORDER — ACETAMINOPHEN 500 MG PO TABS
1000.0000 mg | ORAL_TABLET | ORAL | Status: AC
  Administered 2023-09-05: 1000 mg via ORAL
  Filled 2023-09-05: qty 2

## 2023-09-05 MED ORDER — PHENYLEPHRINE 80 MCG/ML (10ML) SYRINGE FOR IV PUSH (FOR BLOOD PRESSURE SUPPORT)
PREFILLED_SYRINGE | INTRAVENOUS | Status: DC | PRN
  Administered 2023-09-05: 80 ug via INTRAVENOUS
  Administered 2023-09-05: 120 ug via INTRAVENOUS

## 2023-09-05 MED ORDER — VISTASEAL 4 ML SINGLE DOSE KIT
4.0000 mL | PACK | Freq: Once | CUTANEOUS | Status: AC
  Administered 2023-09-05: 4 mL via TOPICAL
  Filled 2023-09-05: qty 4

## 2023-09-05 MED ORDER — KCL IN DEXTROSE-NACL 20-5-0.45 MEQ/L-%-% IV SOLN
INTRAVENOUS | Status: AC
  Filled 2023-09-05 (×2): qty 1000

## 2023-09-05 MED ORDER — BUPIVACAINE LIPOSOME 1.3 % IJ SUSP
INTRAMUSCULAR | Status: DC | PRN
  Administered 2023-09-05: 20 mL

## 2023-09-05 SURGICAL SUPPLY — 77 items
ANTIFOG SOL W/FOAM PAD STRL (MISCELLANEOUS) ×1
APL LAPSCP 35 DL APL RGD (MISCELLANEOUS) ×2
APL PRP STRL LF DISP 70% ISPRP (MISCELLANEOUS) ×2
APL SWBSTK 6 STRL LF DISP (MISCELLANEOUS)
APPLICATOR COTTON TIP 6 STRL (MISCELLANEOUS) IMPLANT
APPLICATOR COTTON TIP 6IN STRL (MISCELLANEOUS)
APPLICATOR VISTASEAL 35 (MISCELLANEOUS) ×2 IMPLANT
APPLIER CLIP ROT 13.4 12 LRG (CLIP)
APR CLP LRG 13.4X12 ROT 20 MLT (CLIP)
BAG COUNTER SPONGE SURGICOUNT (BAG) IMPLANT
BAG SPNG CNTER NS LX DISP (BAG)
BLADE SURG SZ11 CARB STEEL (BLADE) ×1 IMPLANT
CABLE HIGH FREQUENCY MONO STRZ (ELECTRODE) IMPLANT
CHLORAPREP W/TINT 26 (MISCELLANEOUS) ×2 IMPLANT
CLIP APPLIE ROT 13.4 12 LRG (CLIP) IMPLANT
CLIP SUT LAPRA TY ABSORB (SUTURE) ×2 IMPLANT
CUTTER FLEX LINEAR 45M (STAPLE) IMPLANT
DEVICE SUT QUICK LOAD TK 5 (SUTURE) IMPLANT
DEVICE SUT TI-KNOT TK 5X26 (SUTURE) IMPLANT
DEVICE SUTURE ENDOST 10MM (ENDOMECHANICALS) ×1 IMPLANT
DRAIN PENROSE 0.25X18 (DRAIN) ×1 IMPLANT
DRSG TEGADERM 2-3/8X2-3/4 SM (GAUZE/BANDAGES/DRESSINGS) ×6 IMPLANT
ELECT REM PT RETURN 15FT ADLT (MISCELLANEOUS) ×1 IMPLANT
GAUZE 4X4 16PLY ~~LOC~~+RFID DBL (SPONGE) ×1 IMPLANT
GAUZE SPONGE 2X2 8PLY STRL LF (GAUZE/BANDAGES/DRESSINGS) ×1 IMPLANT
GAUZE SPONGE 4X4 12PLY STRL (GAUZE/BANDAGES/DRESSINGS) IMPLANT
GLOVE BIO SURGEON STRL SZ7.5 (GLOVE) ×1 IMPLANT
GLOVE INDICATOR 8.0 STRL GRN (GLOVE) ×1 IMPLANT
GOWN STRL REUS W/ TWL XL LVL3 (GOWN DISPOSABLE) ×4 IMPLANT
GOWN STRL REUS W/TWL XL LVL3 (GOWN DISPOSABLE) ×4
IRRIG SUCT STRYKERFLOW 2 WTIP (MISCELLANEOUS) ×1
IRRIGATION SUCT STRKRFLW 2 WTP (MISCELLANEOUS) ×1 IMPLANT
KIT BASIN OR (CUSTOM PROCEDURE TRAY) ×1 IMPLANT
KIT GASTRIC LAVAGE 34FR ADT (SET/KITS/TRAYS/PACK) ×1 IMPLANT
KIT TURNOVER KIT A (KITS) IMPLANT
MARKER SKIN DUAL TIP RULER LAB (MISCELLANEOUS) ×1 IMPLANT
MAT PREVALON FULL STRYKER (MISCELLANEOUS) ×1 IMPLANT
NDL SPNL 22GX3.5 QUINCKE BK (NEEDLE) ×1 IMPLANT
NEEDLE SPNL 22GX3.5 QUINCKE BK (NEEDLE) ×1
PACK CARDIOVASCULAR III (CUSTOM PROCEDURE TRAY) ×1 IMPLANT
RELOAD 45 VASCULAR/THIN (ENDOMECHANICALS)
RELOAD ENDO STITCH 2.0 (ENDOMECHANICALS) ×10
RELOAD STAPLE 45 2.5 WHT GRN (ENDOMECHANICALS) IMPLANT
RELOAD STAPLE 45 3.5 BLU ETS (ENDOMECHANICALS) IMPLANT
RELOAD STAPLE 60 2.6 WHT THN (STAPLE) ×2 IMPLANT
RELOAD STAPLE 60 3.6 BLU REG (STAPLE) ×2 IMPLANT
RELOAD STAPLE 60 3.8 GOLD REG (STAPLE) ×1 IMPLANT
RELOAD STAPLE TA45 3.5 REG BLU (ENDOMECHANICALS)
RELOAD SUT SNGL STCH ABSRB 2-0 (ENDOMECHANICALS) ×5 IMPLANT
RELOAD SUT SNGL STCH BLK 2-0 (ENDOMECHANICALS) ×4 IMPLANT
SCISSORS LAP 5X45 EPIX DISP (ENDOMECHANICALS) ×1 IMPLANT
SET TUBE SMOKE EVAC HIGH FLOW (TUBING) ×1 IMPLANT
SHEARS HARMONIC 45 ACE (MISCELLANEOUS) ×1 IMPLANT
SLEEVE ADV FIXATION 12X100MM (TROCAR) ×2 IMPLANT
SLEEVE ADV FIXATION 5X100MM (TROCAR) IMPLANT
SOLUTION ANTFG W/FOAM PAD STRL (MISCELLANEOUS) ×1 IMPLANT
STAPLER ECHELON BIOABSB 60 FLE (MISCELLANEOUS) IMPLANT
STAPLER ECHELON LONG 60 440 (INSTRUMENTS) ×1 IMPLANT
STAPLER RELOAD BLUE 60MM (STAPLE) ×4
STAPLER RELOAD GOLD 60MM (STAPLE) ×1
STAPLER RELOAD WHITE 60MM (STAPLE) ×2
STRIP CLOSURE SKIN 1/2X4 (GAUZE/BANDAGES/DRESSINGS) ×1 IMPLANT
SURGILUBE 2OZ TUBE FLIPTOP (MISCELLANEOUS) ×1 IMPLANT
SUT MNCRL AB 4-0 PS2 18 (SUTURE) ×1 IMPLANT
SUT RELOAD ENDO STITCH 2 48X1 (ENDOMECHANICALS) ×6
SUT RELOAD ENDO STITCH 2.0 (ENDOMECHANICALS) ×4
SUT SURGIDAC NAB ES-9 0 48 120 (SUTURE) IMPLANT
SUT VIC AB 2-0 SH 27 (SUTURE) ×1
SUT VIC AB 2-0 SH 27X BRD (SUTURE) ×1 IMPLANT
SYR 20ML LL LF (SYRINGE) ×2 IMPLANT
TOWEL OR 17X26 10 PK STRL BLUE (TOWEL DISPOSABLE) ×1 IMPLANT
TOWEL OR NON WOVEN STRL DISP B (DISPOSABLE) ×1 IMPLANT
TRAY FOLEY MTR SLVR 16FR STAT (SET/KITS/TRAYS/PACK) IMPLANT
TROCAR ADV FIXATION 12X100MM (TROCAR) ×1 IMPLANT
TROCAR ADV FIXATION 5X100MM (TROCAR) ×1 IMPLANT
TROCAR XCEL NON-BLD 5MMX100MML (ENDOMECHANICALS) ×1 IMPLANT
TUBING CONNECTING 10 (TUBING) ×2 IMPLANT

## 2023-09-05 NOTE — Anesthesia Procedure Notes (Signed)
Procedure Name: Intubation Date/Time: 09/05/2023 7:48 AM  Performed by: Ludwig Lean, CRNAPre-anesthesia Checklist: Patient identified, Emergency Drugs available, Suction available and Patient being monitored Patient Re-evaluated:Patient Re-evaluated prior to induction Oxygen Delivery Method: Circle system utilized Preoxygenation: Pre-oxygenation with 100% oxygen Induction Type: IV induction Ventilation: Mask ventilation without difficulty Laryngoscope Size: Glidescope and 4 Grade View: Grade I Tube type: Oral Tube size: 7.5 mm Number of attempts: 1 Airway Equipment and Method: Stylet, Rigid stylet and Video-laryngoscopy Placement Confirmation: ETT inserted through vocal cords under direct vision, positive ETCO2 and breath sounds checked- equal and bilateral Secured at: 22 cm Tube secured with: Tape Dental Injury: Teeth and Oropharynx as per pre-operative assessment  Difficulty Due To: Difficulty was anticipated, Difficult Airway- due to limited oral opening and Difficult Airway- due to large tongue Comments: Smooth IV induction, easy mask, Glidescope S4 , grade 1 view, AOI, tube taped at 22cm, Bil BS + & =

## 2023-09-05 NOTE — Op Note (Signed)
Preoperative diagnosis: Roux-en-Y gastric bypass  Postoperative diagnosis: Same   Procedure: Upper endoscopy   Surgeon: Feliciana Rossetti, M.D.  Anesthesia: Gen.   Indications for procedure: This patient was undergoing a Roux-en-Y gastric bypass.   Description of procedure: The endoscopy was placed in the mouth and into the oropharynx and under endoscopic vision it was advanced to the esophagogastric junction.  The stomach was insufflated and no bleeding or bubbles were seen.  The GEJ was identified at 37cm from the teeth. The anastomosis was seen at 45 cm. No bleeding or leaks were detected. The scope was withdrawn without difficulty.    Feliciana Rossetti, M.D. General, Bariatric, & Minimally Invasive Surgery [facility]

## 2023-09-05 NOTE — Op Note (Signed)
Jennifer [name] [id number] 10/19/1976. 09/05/2023  Preoperative diagnosis:  Severe obesity (BMI 50)  Essential hypertension  Prediabetes  Chronic bilateral low back pain without sciatica  Diverticulosis  Primary osteoarthritis of both knees   Mild osa  Postoperative  diagnosis:  1. same  Surgical procedure: Laparoscopic Roux-en-Y gastric bypass (ante-colic, ante-gastric); upper endoscopy  Surgeon: Atilano Ina, M.D. FACS  Asst.: Feliciana Rossetti MD FACS  Anesthesia: General plus exparel/marcaine mix  Complications: None   EBL: Minimal   Drains: None   Disposition: PACU in good condition   Indications for procedure: 47 y.o. yo female with morbid obesity who has been unsuccessful at sustained weight loss. The patient's comorbidities are listed above. We discussed the risk and benefits of surgery including but not limited to anesthesia risk, bleeding, infection, blood clot formation, anastomotic leak, anastomotic stricture, ulcer formation, death, respiratory complications, intestinal blockage, internal hernia, gallstone formation, vitamin and nutritional deficiencies, injury to surrounding structures, failure to lose weight and mood changes.   Description of procedure: Patient is brought to the operating room and general anesthesia induced. The patient had received preoperative broad-spectrum IV antibiotics and subcutaneous heparin. The abdomen was widely sterilely prepped with Chloraprep and draped. Patient timeout was performed and correct patient and procedure confirmed. Access was obtained with a 5 mm Optiview trocar in the left upper quadrant and pneumoperitoneum established without difficulty. Under direct vision 12 mm trocars were placed laterally in the right upper quadrant, right upper quadrant midclavicular line, and to the left and above the umbilicus for the camera port. A 5 mm trocar was placed laterally in the left upper quadrant.  Exparel/marcaine mix was infiltrated  in bilateral lateral abdominal walls as a TAP block for postoperative pain relief. The omentum was brought into the upper abdomen and the transverse mesocolon elevated and the ligament of Treitz clearly identified. A 50 cm biliopancreatic limb was then carefully measured from the ligament of Treitz. The small intestine was divided at this point with a single firing of the white load linear stapler. A Penrose drain was sutured to the end of the Roux-en-Y limb for later identification. A 100 cm Roux-en-Y limb was then carefully measured. At this point a side-to-side anastomosis was created between the Roux limb and the end of the biliopancreatic limb. This was accomplished with a single firing of the 60 mm white load linear stapler. The common enterotomy was closed with a running 2-0 Vicryl begun at either end of the enterotomy and tied centrally. Vistaseal tissue sealant was placed over the anastomosis. The mesenteric defect was then closed with running 2-0 silk. The omentum was then divided with the harmonic scalpel up towards the transverse colon to allow mobility of the Roux limb toward the gastric pouch. The patient was then placed in steep reversed Trendelenburg. Through a 5 mm subxiphoid site the [facility] retractor was placed and the left lobe of the liver elevated with excellent exposure of the upper stomach and hiatus. The angle of Hiss was then mobilized with the harmonic scalpel. A 5cm gastric pouch was then carefully measured along the lesser curve of the stomach. Dissection was carried along the lesser curve at this point with the Harmonic scalpel working carefully back toward the lesser sac at right angles to the lesser curve. The free lesser sac was then entered. After being sure all tubes were removed from the stomach an initial firing of the gold load 60 mm linear stapler was fired at right angles across the lesser curve for  about 4 cm. The gastric pouch was further mobilized posteriorly and then the  pouch was completed with 3 further firings of the 60 mm blue load linear stapler up through the previously dissected angle of His. It was ensured that the pouch was completely mobilized away from the gastric remnant. This created a nice tubular 5 cm gastric pouch. The Roux limb was then brought up in an antecolic fashion with the candycane facing to the patient's left without undue tension. The gastrojejunostomy was created with an initial posterior row of 2-0 Vicryl between the Roux limb and the staple line of the gastric pouch. Enterotomies were then made in the gastric pouch and the Roux limb with the harmonic scalpel and at approximately 2-2-1/2 cm anastomosis was created with a single firing of the 60mm blue load linear stapler. The staple line was inspected and was intact without bleeding. The common enterotomy was then closed with running 2-0 Vicryl begun at either end and tied centrally. The Ewall tube was then easily passed through the anastomosis and an outer anterior layer of running 2-0 Vicryl was placed. The Ewald tube was removed. With the outlet of the gastrojejunostomy clamped and under saline irrigation the assistant performed upper endoscopy and with the gastric pouch tensely distended with air-there was no evidence of leak on this test. The pouch was desufflated. The Vonita Moss defect was closed with running 2-0 silk. The abdomen was inspected for any evidence of bleeding or bowel injury and everything looked fine. The Nathanson retractor was removed under direct vision after coating the anastomosis with Vistaseal tissue sealant. All CO2 was evacuated and trochars removed. Skin incisions were closed with 4-0 monocryl in a subcuticular fashion followed by, steri-strips and bandages. Sponge needle and instrument counts were correct. The patient was taken to the PACU in good condition.    Mary Sella. Andrey Campanile, MD, FACS General, Bariatric, & Minimally Invasive Surgery [facility], [place]

## 2023-09-05 NOTE — Progress Notes (Signed)
    Name: Jennifer [name]               Patient MRN: [id number]  DOA: 09/05/2023   Patient seen in room 1310  Patient drowsy, pt stated overall she feels alright, S/P Gastric bypass on 09/05/23.  Pt family member expressed concerns of pt mobility due to knees. Patient ambulated in room to bathroom with walker provided, noticed slow walking pace, but patient was able to tolerate well.   Recent vital signs    09/05/2023    2:25 PM 09/05/2023    1:21 PM 09/05/2023   12:20 PM  Vitals with BMI  Systolic 122 118 952  Diastolic 74 75 67  Pulse 80 78 78     Intake/Output:  PO intake: 180 mL  Intake/Output Summary (Last 24 hours) at 09/05/2023 1444 Last data filed at 09/05/2023 1400 Gross per 24 hour  Intake 620 ml  Output 50 ml  Net 570 ml      Discussed QI "Goals for Discharge" document with patient including ambulation in halls, Incentive Spirometry use every hour, and oral care.  Also discussed pain and nausea control.  Enabled or verified head of bed 30 degree alarm activated.  BSTOP education provided including BSTOP information guide, "Guide for Pain Management after your Bariatric Procedure".  Diet progression education provided including "Bariatric Surgery Post-Op Food Plan Phase 1: Liquids".  Questions answered.  Will continue to partner with bedside RN and follow up with patient per protocol.      Thank you,  Lubertha Basque, RN, MSN Bariatric Nurse Coordinator [phone] (office)

## 2023-09-05 NOTE — H&P (Signed)
CC: here for surgery  Requesting provider: n/a  HPI: Jennifer [name] is an 47 y.o. female who is here for lap rygb, upper endo. Deneis any changes since seen in clinic  Old hpi: Jennifer [name] is a 46 y.o. female who is seen today for long-term follow-up regarding her severe obesity. I initially met her in March 2023 to discuss bariatric surgery.  Comorbidities included hypertension, mild obstructive sleep apnea, prediabetes, food related GERD, severe bilateral knee arthritis, chronic low back pain, diverticulosis.  Despite numerous attempts for sustained weight loss she has been unsuccessful. Please see my note from last year regarding those prior attempts. She has been evaluated by psychology and been evaluated and worked with our nutritionist multiple times.  She denies any medical changes since I last saw her. She does have chronic perianal drainage and has a fistula. It has not been evaluated by a colorectal surgeon. She has had a colonoscopy in 2022. And she has had an MRI of her pelvis since I last saw her with results below. She denies any chest pain, chest pressure, shortness of breath,. She does have occasional food related GERD mainly with red sauce and spicy foods and will take Tums. Has only had an appendectomy. She has occasional UTIs. She denies any dysuria or hematuria or pneumaturia. She has daily drainage from around her anus. She has never required incision and drainage.  She has chronic bilateral knee pain which affects her ability to walk. She uses a cane. She has prediabetes.  No tobacco, rare alcohol. No drugs..   Past Medical History:  Diagnosis Date   Abnormal vaginal Pap smear    Acid reflux    Alopecia    Anxiety    Arthritis    Cough 09/13/2022   Depression    Depression, major, single episode, severe (HCC) 06/18/2019   pHQ 9 score of 18 in 05/2019, not suicidal or homicidal  PHQ 9 score of 13 in 03/2021  Score of 21 in 11/2021, score of 7 in 04/2022, score of  20 in 06/2022   Diverticulosis    Family history of diabetes mellitus    GERD (gastroesophageal reflux disease)    Hemorrhoids    History of recurrent UTIs    Hypertension    Obesity    Pre-diabetes    PTSD (post-traumatic stress disorder)    Sleep apnea    Mild OSA,  No CPAP   Tuberculosis 2005   treated, still tests positive    Past Surgical History:  Procedure Laterality Date   APPENDECTOMY     BREAST BIOPSY Left 01/24/2023   MM LT BREAST BX W LOC DEV 1ST LESION IMAGE BX SPEC STEREO GUIDE 01/24/2023 [facility]   COLONOSCOPY N/A 03/13/2014   Dr. Jena Gauss- grade 3 hemorrhoids, colonic diverticulosis bx= benign lymphoid polyp   COLONOSCOPY WITH PROPOFOL N/A 01/26/2021   Procedure: COLONOSCOPY WITH PROPOFOL;  Surgeon: Corbin Ade, MD;  Location: [facility];  Service: Endoscopy;  Laterality: N/A;  AM   KNEE ARTHROSCOPY     right   POLYPECTOMY  01/26/2021   Procedure: POLYPECTOMY;  Surgeon: Corbin Ade, MD;  Location: [facility];  Service: Endoscopy;;    Family History  Adopted: Yes  Problem Relation Age of Onset   Hypertension Mother    Diabetes Mother    Other Mother        cholangiocarcinoma, age 10, deceased   Breast cancer Maternal Aunt    Colon cancer Neg Hx  Tuberous sclerosis Neg Hx    Alpha-1 antitrypsin deficiency Neg Hx     Social:  reports that she has never smoked. She has never used smokeless tobacco. She reports that she does not currently use drugs. She reports that she does not drink alcohol.  Allergies: No Known Allergies  Medications: I have reviewed the patient's current medications.   ROS - all of the below systems have been reviewed with the patient and positives are indicated with bold text General: chills, fever or night sweats Eyes: blurry vision or double vision ENT: epistaxis or sore throat Allergy/Immunology: itchy/watery eyes or nasal congestion Hematologic/Lymphatic: bleeding problems, blood clots or swollen lymph  nodes Endocrine: temperature intolerance or unexpected weight changes Breast: new or changing breast lumps or nipple discharge Resp: cough, shortness of breath, or wheezing CV: chest pain or dyspnea on exertion GI: as per HPI GU: dysuria, trouble voiding, or hematuria MSK: joint pain or joint stiffness Neuro: TIA or stroke symptoms Derm: pruritus and skin lesion changes Psych: anxiety and depression  PE Blood pressure 107/81, pulse 89, temperature 98.5 F (36.9 C), temperature source Oral, resp. rate (!) 21, height 5\' 7"  (1.702 m), weight (!) 143.8 kg, last menstrual period 08/01/2023, SpO2 96%. Constitutional: NAD; conversant; no deformities Eyes: Moist conjunctiva; no lid lag; anicteric; PERRL Neck: Trachea midline; no thyromegaly Lungs: Normal respiratory effort; no tactile fremitus CV: RRR; no palpable thrills; no pitting edema GI: Abd soft, nt; no palpable hepatosplenomegaly MSK: Normal gait; no clubbing/cyanosis Psychiatric: Appropriate affect; alert and oriented x3 Lymphatic: No palpable cervical or axillary lymphadenopathy Skin:no rash  Results for orders placed or performed during the hospital encounter of 09/05/23 (from the past 48 hour(s))  Pregnancy, urine POC     Status: None   Collection Time: 09/05/23  6:15 AM  Result Value Ref Range   Preg Test, Ur NEGATIVE NEGATIVE    Comment:        THE SENSITIVITY OF THIS METHODOLOGY IS >24 mIU/mL     No results found.  Imaging: N/a  A/P: Jennifer [name] is an 47 y.o. female with  Severe obesity (CMS/HHS-HCC)  Essential hypertension  Prediabetes  Chronic bilateral low back pain without sciatica  Diverticulosis  Primary osteoarthritis of both knees  Perianal fistula   To OR LRYGB IV abx Eras  All questions asked and ansseerre  Mary Sella. Andrey Campanile, MD, FACS General, Bariatric, & Minimally Invasive Surgery [facility]

## 2023-09-05 NOTE — Anesthesia Postprocedure Evaluation (Signed)
Anesthesia Post Note  Patient: Jennifer [name]  Procedure(s) Performed: LAPAROSCOPIC ROUX-EN-Y GASTRIC BYPASS WITH UPPER ENDOSCOPY     Patient location during evaluation: PACU Anesthesia Type: General Level of consciousness: awake Pain management: pain level controlled Vital Signs Assessment: post-procedure vital signs reviewed and stable Respiratory status: spontaneous breathing, nonlabored ventilation and respiratory function stable Cardiovascular status: blood pressure returned to baseline and stable Postop Assessment: no apparent nausea or vomiting Anesthetic complications: yes   Encounter Notable Events  Notable Event Outcome Phase Comment  Difficult to intubate - expected  Intraprocedure Filed from anesthesia note documentation.    Last Vitals:  Vitals:   09/05/23 1321 09/05/23 1425  BP: 118/75 122/74  Pulse: 78 80  Resp: 18 16  Temp: 36.4 C 36.7 C  SpO2: 96% 99%    Last Pain:  Vitals:   09/05/23 1425  TempSrc: Oral  PainSc:                  Linton Rump

## 2023-09-05 NOTE — Transfer of Care (Signed)
Immediate Anesthesia Transfer of Care Note  Patient: Jennifer [name]  Procedure(s) Performed: Procedure(s): LAPAROSCOPIC ROUX-EN-Y GASTRIC BYPASS WITH UPPER ENDOSCOPY (N/A)  Patient Location: PACU  Anesthesia Type:General  Level of Consciousness: Patient easily awoken, comfortable, cooperative, following commands, responds to stimulation.   Airway & Oxygen Therapy: Patient spontaneously breathing, ventilating well, oxygen via simple oxygen mask.  Post-op Assessment: Report given to PACU RN, vital signs reviewed and stable, moving all extremities.   Post vital signs: Reviewed and stable.  Complications: No apparent anesthesia complications  Last Vitals:  Vitals Value Taken Time  BP 121/73 09/05/23 1004  Temp    Pulse 88 09/05/23 1007  Resp 15 09/05/23 1007  SpO2 100 % 09/05/23 1007  Vitals shown include unfiled device data.  Last Pain:  Vitals:   09/05/23 0621  TempSrc: Oral  PainSc:          Complications:  Encounter Notable Events  Notable Event Outcome Phase Comment  Difficult to intubate - expected  Intraprocedure Filed from anesthesia note documentation.

## 2023-09-05 NOTE — Progress Notes (Signed)
PHARMACY CONSULT FOR:  Risk Assessment for Post-Discharge VTE Following Bariatric Surgery  Post-Discharge VTE Risk Assessment: This patient's probability of 30-day post-discharge VTE is increased due to the factors marked:  Sleeve gastrectomy   Liver disorder (transplant, cirrhosis, or nonalcoholic steatohepatitis)   Hx of VTE   Hemorrhage requiring transfusion   GI perforation, leak, or obstruction   ====================================================    Female    Age >/=60 years    BMI >/=50 kg/m2    CHF    Dyspnea at Rest    Paraplegia  x  Non-gastric-band surgery    Operation Time >/=3 hr    Return to OR     Length of Stay >/= 3 d   Hypercoagulable condition   Significant venous stasis      Predicted probability of 30-day post-discharge VTE: 0.16% (mild)  Other patient-specific factors to consider:   Recommendation for Discharge: No pharmacologic prophylaxis post-discharge     Jennifer [name] is a 47 y.o. female who underwent  Laparoscopic Roux-En-Y Gastric Bypass with Upper Endoscopy on 09/05/2023   Case start: 0803 Case end: 0955   No Known Allergies  Patient Measurements: Height: 5\' 7"  (170.2 cm) Weight: (!) 143.8 kg (317 lb) IBW/kg (Calculated) : 61.6 Body mass index is 49.65 kg/m.  No results for input(s): "WBC", "HGB", "HCT", "PLT", "APTT", "CREATININE", "LABCREA", "CREAT24HRUR", "MG", "PHOS", "ALBUMIN", "PROT", "AST", "ALT", "ALKPHOS", "BILITOT", "BILIDIR", "IBILI" in the last 72 hours. Estimated Creatinine Clearance: 120.9 mL/min (by C-G formula based on SCr of 0.84 mg/dL).    Past Medical History:  Diagnosis Date   Abnormal vaginal Pap smear    Acid reflux    Alopecia    Anxiety    Arthritis    Cough 09/13/2022   Depression    Depression, major, single episode, severe (HCC) 06/18/2019   pHQ 9 score of 18 in 05/2019, not suicidal or homicidal  PHQ 9 score of 13 in 03/2021  Score of 21 in 11/2021, score of 7 in 04/2022, score of 20 in  06/2022   Diverticulosis    Family history of diabetes mellitus    GERD (gastroesophageal reflux disease)    Hemorrhoids    History of recurrent UTIs    Hypertension    Obesity    Pre-diabetes    PTSD (post-traumatic stress disorder)    Sleep apnea    Mild OSA,  No CPAP   Tuberculosis 2005   treated, still tests positive     Medications Prior to Admission  Medication Sig Dispense Refill Last Dose   amLODipine (NORVASC) 2.5 MG tablet TAKE 1 TABLET BY MOUTH EVERY DAY 90 tablet 2 09/05/2023   amLODipine (NORVASC) 5 MG tablet TAKE 1 TABLET (5 MG TOTAL) BY MOUTH DAILY. 90 tablet 2 09/05/2023   FLUoxetine (PROZAC) 20 MG capsule Take 3 capsules (60 mg total) by mouth daily. 270 capsule 0 09/05/2023   fluticasone (FLONASE) 50 MCG/ACT nasal spray SPRAY 2 SPRAYS INTO EACH NOSTRIL EVERY DAY (Patient taking differently: Place 2 sprays into both nostrils daily as needed for allergies.) 48 mL 2 Past Week   gabapentin (NEURONTIN) 300 MG capsule TAKE 1 CAPSULE BY MOUTH EVERYDAY AT BEDTIME 30 capsule 3 09/03/2023   levocetirizine (XYZAL) 5 MG tablet TAKE 1 TABLET BY MOUTH EVERY DAY IN THE EVENING 90 tablet 1 09/05/2023   lisdexamfetamine (VYVANSE) 30 MG capsule Take 1 capsule (30 mg total) by mouth daily. 30 capsule 0    meloxicam (MOBIC) 15 MG tablet Take 15 mg by mouth  daily.   Past Month   oxybutynin (DITROPAN-XL) 10 MG 24 hr tablet TAKE 1 TABLET BY MOUTH EVERYDAY AT BEDTIME 90 tablet 2 09/03/2023   ACCU-CHEK GUIDE test strip USE TO CHECK SUGAR UP TO 4 TIMES DAILY AS DIRECTED E10.9 E11.9 100 strip 1    nitrofurantoin, macrocrystal-monohydrate, (MACROBID) 100 MG capsule Take 1 capsule (100 mg total) by mouth 2 (two) times daily. (Patient not taking: Reported on 08/12/2023) 10 capsule 0 Not Taking   norethindrone (MICRONOR) 0.35 MG tablet TAKE 1 TABLET BY MOUTH EVERY DAY 84 tablet 3 More than a month   triamterene-hydrochlorothiazide (MAXZIDE) 75-50 MG tablet TAKE 1 TABLET BY MOUTH EVERY DAY 90 tablet  3 More than a month       Lynden Ang, PharmD, BCPS 09/05/2023,11:19 AM

## 2023-09-06 ENCOUNTER — Other Ambulatory Visit (HOSPITAL_COMMUNITY): Payer: Self-pay

## 2023-09-06 ENCOUNTER — Other Ambulatory Visit: Payer: Self-pay

## 2023-09-06 ENCOUNTER — Encounter (HOSPITAL_COMMUNITY): Payer: Self-pay | Admitting: General Surgery

## 2023-09-06 LAB — COMPREHENSIVE METABOLIC PANEL
ALT: 22 U/L (ref 0–44)
AST: 19 U/L (ref 15–41)
Albumin: 3.6 g/dL (ref 3.5–5.0)
Alkaline Phosphatase: 60 U/L (ref 38–126)
Anion gap: 9 (ref 5–15)
BUN: 11 mg/dL (ref 6–20)
CO2: 23 mmol/L (ref 22–32)
Calcium: 9 mg/dL (ref 8.9–10.3)
Chloride: 106 mmol/L (ref 98–111)
Creatinine, Ser: 0.76 mg/dL (ref 0.44–1.00)
GFR, Estimated: >60 mL/min (ref >60)
Glucose, Bld: 123 mg/dL — ABNORMAL HIGH (ref 70–99)
Potassium: 4 mmol/L (ref 3.5–5.1)
Sodium: 138 mmol/L (ref 135–145)
Total Bilirubin: 0.6 mg/dL (ref 0.3–1.2)
Total Protein: 6.8 g/dL (ref 6.5–8.1)

## 2023-09-06 LAB — CBC WITH DIFFERENTIAL/PLATELET
Abs Immature Granulocytes: 0.04 10*3/uL (ref 0.00–0.07)
Basophils Absolute: 0 10*3/uL (ref 0.0–0.1)
Basophils Relative: 0 %
Eosinophils Absolute: 0 10*3/uL (ref 0.0–0.5)
Eosinophils Relative: 0 %
HCT: 36.8 % (ref 36.0–46.0)
Hemoglobin: 11.8 g/dL — ABNORMAL LOW (ref 12.0–15.0)
Immature Granulocytes: 1 %
Lymphocytes Relative: 13 %
Lymphs Abs: 1.1 10*3/uL (ref 0.7–4.0)
MCH: 30.3 pg (ref 26.0–34.0)
MCHC: 32.1 g/dL (ref 30.0–36.0)
MCV: 94.6 fL (ref 80.0–100.0)
Monocytes Absolute: 0.5 10*3/uL (ref 0.1–1.0)
Monocytes Relative: 6 %
Neutro Abs: 7.1 10*3/uL (ref 1.7–7.7)
Neutrophils Relative %: 80 %
Platelets: 324 10*3/uL (ref 150–400)
RBC: 3.89 MIL/uL (ref 3.87–5.11)
RDW: 14.1 % (ref 11.5–15.5)
WBC: 8.8 10*3/uL (ref 4.0–10.5)
nRBC: 0 % (ref 0.0–0.2)

## 2023-09-06 MED ORDER — PANTOPRAZOLE SODIUM 40 MG PO TBEC
40.0000 mg | DELAYED_RELEASE_TABLET | Freq: Every day | ORAL | 0 refills | Status: DC
  Filled 2023-09-06: qty 90, 90d supply, fill #0

## 2023-09-06 MED ORDER — ENOXAPARIN (LOVENOX) PATIENT EDUCATION KIT
PACK | Freq: Once | Status: AC
  Filled 2023-09-06: qty 1

## 2023-09-06 MED ORDER — OXYCODONE HCL 5 MG PO TABS
5.0000 mg | ORAL_TABLET | Freq: Four times a day (QID) | ORAL | 0 refills | Status: DC | PRN
  Filled 2023-09-06: qty 10, 3d supply, fill #0

## 2023-09-06 MED ORDER — ENOXAPARIN SODIUM 40 MG/0.4ML IJ SOSY
40.0000 mg | PREFILLED_SYRINGE | Freq: Two times a day (BID) | INTRAMUSCULAR | 0 refills | Status: DC
  Filled 2023-09-06 (×2): qty 11.2, 14d supply, fill #0

## 2023-09-06 MED ORDER — ONDANSETRON 4 MG PO TBDP
4.0000 mg | ORAL_TABLET | Freq: Four times a day (QID) | ORAL | 0 refills | Status: DC | PRN
  Filled 2023-09-06: qty 20, 5d supply, fill #0

## 2023-09-06 MED ORDER — ACETAMINOPHEN 500 MG PO TABS: 1000.0000 mg | ORAL_TABLET | Freq: Three times a day (TID) | ORAL | Status: AC

## 2023-09-06 NOTE — Plan of Care (Signed)
  Problem: Education: Goal: Knowledge of General Education information will improve Description Including pain rating scale, medication(s)/side effects and non-pharmacologic comfort measures Outcome: Progressing   Problem: Health Behavior/Discharge Planning: Goal: Ability to manage health-related needs will improve Outcome: Progressing   

## 2023-09-06 NOTE — Plan of Care (Signed)
  Problem: Education: Goal: Knowledge of General Education information will improve Description: Including pain rating scale, medication(s)/side effects and non-pharmacologic comfort measures Outcome: Progressing   Problem: Nutrition: Goal: Adequate nutrition will be maintained Outcome: Progressing   Problem: Coping: Goal: Level of anxiety will decrease Outcome: Progressing   

## 2023-09-06 NOTE — Care Management Important Message (Signed)
Important Message  Patient Details IM Letter given. Name: Jennifer [name] MRN: [id number] Date of Birth: [birth year]   Important Message Given:  Yes - Medicare IM     Caren Macadam 09/06/2023, 2:47 PM

## 2023-09-06 NOTE — Progress Notes (Signed)
OT Cancellation Note  Patient Details Name: Jennifer [name] MRN: [id number] DOB: April 27, 1976   Cancelled Treatment:    Reason Eval/Treat Not Completed: OT screened, no needs identified, will sign off.  Reuben Likes, OTR/L 09/06/2023, 1:12 PM

## 2023-09-06 NOTE — Progress Notes (Signed)
Discharge instructions given to patient and all questions were answered.  

## 2023-09-06 NOTE — Progress Notes (Signed)
   09/06/23 1522  TOC Brief Assessment  Insurance and Status Reviewed  Patient has primary care physician Yes  Home environment has been reviewed home with children  Prior level of function: independent  Prior/Current Home Services No current home services  Social Determinants of Health Reivew SDOH reviewed no interventions necessary  Readmission risk has been reviewed Yes  Transition of care needs no transition of care needs at this time

## 2023-09-06 NOTE — Progress Notes (Signed)
Patient alert and oriented, Post op day 1.  Provided support and encouragement.  Encouraged pulmonary toilet, ambulation and small sips of liquids.  All questions answered.  Will continue to monitor.    Thank you,  Lubertha Basque, RN, MSN Bariatric Nurse Coordinator [phone] (office)

## 2023-09-06 NOTE — Evaluation (Signed)
Physical Therapy Evaluation Patient Details Name: Jennifer [name] MRN: [id number] DOB: 12-22-1975 Today's Date: 09/06/2023  History of Present Illness  47 yo female S/PLaparoscopic Roux-en-Y gastric bypass (ante-colic, ante-gastric); upper endoscopyon 09/05/23  Clinical Impression  The patient is eager to   ambulate, has already today. The patient will benefit from a rollator to ensure safe ambulation while  working on weight reduction post gastric bypass surgery.  The patient reports   having multiple falls related to  L knee  issues. Patient  has a copy of bilateral  Knee xrays with  noted abnormality. Patient  encouraged to f/u with  orthopedist to ensure safe ambulation and learn if she will be a candidate for left knee surgery in the future.  Patient does not qualify for a bariatric rollator which is recommended. Patient aware that she can order one and provided the information to patient. No further PT  intervention at this time. Patient is to DC home today.PT will sign off.       If plan is discharge home, recommend the following: Help with stairs or ramp for entrance;Assistance with cooking/housework;Assist for transportation   Can travel by private vehicle        Equipment Recommendations Rollator (4 wheels)  Recommendations for Other Services       Functional Status Assessment Patient has not had a recent decline in their functional status     Precautions / Restrictions Precautions Precautions: Fall Precaution Comments: has severe DJD left knee per xray she had done recently(n Restrictions Weight Bearing Restrictions: No      Mobility  Bed Mobility Overal bed mobility: Independent                  Transfers Overall transfer level: Modified independent Equipment used: None                    Ambulation/Gait Ambulation/Gait assistance: Modified independent (Device/Increase time) Gait Distance (Feet): 200 Feet Assistive device: Rollator (4  wheels) Gait Pattern/deviations: Step-through pattern, Antalgic Gait velocity: decr     General Gait Details: on left leg, antalgi  Stairs            Wheelchair Mobility     Tilt Bed    Modified Rankin (Stroke Patients Only)       Balance Overall balance assessment: History of Falls, Mild deficits observed, not formally tested                                           Pertinent Vitals/Pain Pain Assessment Pain Assessment: Faces Pain Score: 6  Pain Location: left knee Pain Descriptors / Indicators: Discomfort Pain Intervention(s): Monitored during session, Premedicated before session    Home Living Family/patient expects to be discharged to:: Private residence Living Arrangements: Children;Other relatives;Parent Available Help at Discharge: Family;Available PRN/intermittently Type of Home: Mobile home Home Access: Ramped entrance         Home Equipment: None      Prior Function Prior Level of Function : Independent/Modified Independent             Mobility Comments: reports that she has fallen several times due to left knee isues ADLs Comments: IADLs     Extremity/Trunk Assessment   Upper Extremity Assessment Upper Extremity Assessment: Overall WFL for tasks assessed    Lower Extremity Assessment Lower Extremity Assessment: LLE deficits/detail;RLE deficits/detail RLE Deficits / Details:  genu  valgus LLE Deficits / Details: grossly WFL    Cervical / Trunk Assessment Cervical / Trunk Assessment: Normal;Other exceptions Cervical / Trunk Exceptions: body habitus  Communication   Communication Communication: No apparent difficulties  Cognition Arousal: Alert Behavior During Therapy: WFL for tasks assessed/performed Overall Cognitive Status: Within Functional Limits for tasks assessed                                          General Comments      Exercises     Assessment/Plan    PT Assessment  Patient does not need any further PT services  PT Problem List Decreased strength;Decreased knowledge of precautions;Decreased mobility       PT Treatment Interventions      PT Goals (Current goals can be found in the Care Plan section)  Acute Rehab PT Goals Patient Stated Goal: go home, have L knee surgery PT Goal Formulation: All assessment and education complete, DC therapy    Frequency       Co-evaluation               AM-PAC PT "6 Clicks" Mobility  Outcome Measure Help needed turning from your back to your side while in a flat bed without using bedrails?: None Help needed moving from lying on your back to sitting on the side of a flat bed without using bedrails?: None Help needed moving to and from a bed to a chair (including a wheelchair)?: None Help needed standing up from a chair using your arms (e.g., wheelchair or bedside chair)?: None Help needed to walk in hospital room?: None Help needed climbing 3-5 steps with a railing? : A Little 6 Click Score: 23    End of Session   Activity Tolerance: Patient tolerated treatment well Patient left: in bed Nurse Communication: Mobility status PT Visit Diagnosis: Unsteadiness on feet (R26.81);Pain Pain - Right/Left: Left Pain - part of body: Knee    Time: 1111-1200 PT Time Calculation (min) (ACUTE ONLY): 49 min   Charges:   PT Evaluation $PT Eval Low Complexity: 1 Low PT Treatments $Gait Training: 8-22 mins $Self Care/Home Management: 8-22 PT General Charges $$ ACUTE PT VISIT: 1 Visit         Blanchard Kelch PT Acute Rehabilitation Services Office [phone] Weekend [phone]   Rada Hay 09/06/2023, 2:06 PM

## 2023-09-06 NOTE — Progress Notes (Signed)
Patient alert and oriented, pain is controlled. Patient is tolerating fluids, advanced to protein shake today, patient is tolerating well. Reviewed Gastric sleeve/bypass discharge instructions with patient and patient is able to articulate understanding. Provided information on BELT program, Support Group, BSTOP-D, and [facility] outpatient pharmacy. Communicated general update of patient status to surgeon. All questions answered. 24hr fluid recall is 870 mL per hydration protocol, bariatric nurse coordinator to make follow-up phone call within one week.

## 2023-09-08 NOTE — Discharge Summary (Signed)
Physician Discharge Summary  Jennifer [name] ZOX:[id number] DOB: 1976/07/30 DOA: 09/05/2023  PCP: [name], MD  Admit date: 09/05/2023 Discharge date: 09/06/2023  Recommendations for Outpatient Follow-up:     Discharge Diagnoses:  Principal Problem:   S/P gastric bypass Severe obesity (BMI 50)  Essential hypertension  Prediabetes  Chronic bilateral low back pain without sciatica  Diverticulosis  Primary osteoarthritis of both knees    Mild osa  Surgical Procedure: Laparoscopic Roux-en-Y gastric bypass, upper endoscopy  Discharge Condition: Good Disposition: Home  Diet recommendation: Postoperative gastric bypass diet  Filed Weights   09/05/23 0559  Weight: (!) 143.8 kg     Hospital Course:  The patient was admitted for a planned laparoscopic Roux-en-Y gastric bypass. Please see operative note. Preoperatively the patient was given 5000 units of subcutaneous heparin for DVT prophylaxis. ERAS protocol was used. Postoperative prophylactic heparin dosing was started on the evening of postoperative day 0.  The patient was started on ice chips and water on the evening of POD 0 which they tolerated. On postoperative day 1 The patient's diet was advanced to protein shakes which they also tolerated. On POD 1, The patient was ambulating without difficulty. Their vital signs are stable without fever or tachycardia. Their hemoglobin had remained stable. The patient had received discharge instructions and counseling. They were deemed stable for discharge.  She was sent out on prophylactic lovenox for vte prophylaxis  BP 117/77 (BP Location: Right Arm)   Pulse 86   Temp 98.7 F (37.1 C) (Oral)   Resp 18   Ht 5\' 7"  (1.702 m)   Wt (!) 143.8 kg   LMP 08/01/2023 (Approximate)   SpO2 97%   BMI 49.65 kg/m   Gen: alert, NAD, non-toxic appearing Pupils: equal, no scleral icterus Pulm: Lungs clear to auscultation, symmetric chest rise CV: regular rate and rhythm Abd:  soft, min tender, nondistended. No cellulitis. No incisional hernia Ext: no edema, no calf tenderness Skin: no rash, no jaundice  Discharge Instructions  Discharge Instructions     Ambulate hourly while awake   Complete by: As directed    Call MD for:  difficulty breathing, headache or visual disturbances   Complete by: As directed    Call MD for:  persistant dizziness or light-headedness   Complete by: As directed    Call MD for:  persistant nausea and vomiting   Complete by: As directed    Call MD for:  redness, tenderness, or signs of infection (pain, swelling, redness, odor or green/yellow discharge around incision site)   Complete by: As directed    Call MD for:  severe uncontrolled pain   Complete by: As directed    Call MD for:  temperature >101 F   Complete by: As directed    Diet bariatric full liquid   Complete by: As directed    Discharge instructions   Complete by: As directed    See bariatric discharge instructions   Incentive spirometry   Complete by: As directed    Perform hourly while awake      Allergies as of 09/06/2023   No Known Allergies      Medication List     STOP taking these medications    meloxicam 15 MG tablet Commonly known as: MOBIC   nitrofurantoin (macrocrystal-monohydrate) 100 MG capsule Commonly known as: Macrobid       TAKE these medications    Accu-Chek Guide test strip Generic drug: glucose blood USE TO CHECK SUGAR UP TO 4  TIMES DAILY AS DIRECTED E10.9 E11.9   acetaminophen 500 MG tablet Commonly known as: TYLENOL Take 2 tablets (1,000 mg total) by mouth every 8 (eight) hours for 5 days.   amLODipine 5 MG tablet Commonly known as: NORVASC TAKE 1 TABLET (5 MG TOTAL) BY MOUTH DAILY.   amLODipine 2.5 MG tablet Commonly known as: NORVASC TAKE 1 TABLET BY MOUTH EVERY DAY   enoxaparin 40 MG/0.4ML injection Commonly known as: LOVENOX Inject 0.4 mLs (40 mg total) into the skin every 12 (twelve) hours for 14 days.    FLUoxetine 20 MG capsule Commonly known as: PROZAC Take 3 capsules (60 mg total) by mouth daily.   fluticasone 50 MCG/ACT nasal spray Commonly known as: FLONASE SPRAY 2 SPRAYS INTO EACH NOSTRIL EVERY DAY What changed: See the new instructions.   gabapentin 300 MG capsule Commonly known as: NEURONTIN TAKE 1 CAPSULE BY MOUTH EVERYDAY AT BEDTIME   levocetirizine 5 MG tablet Commonly known as: XYZAL TAKE 1 TABLET BY MOUTH EVERY DAY IN THE EVENING   lisdexamfetamine 30 MG capsule Commonly known as: VYVANSE Take 1 capsule (30 mg total) by mouth daily.   norethindrone 0.35 MG tablet Commonly known as: MICRONOR TAKE 1 TABLET BY MOUTH EVERY DAY   ondansetron 4 MG disintegrating tablet Commonly known as: ZOFRAN-ODT Dissolve 1 tablet (4 mg total) by mouth every 6 (six) hours as needed for nausea or vomiting.   oxybutynin 10 MG 24 hr tablet Commonly known as: DITROPAN-XL TAKE 1 TABLET BY MOUTH EVERYDAY AT BEDTIME   oxyCODONE 5 MG immediate release tablet Commonly known as: Oxy IR/ROXICODONE Take 1 tablet (5 mg total) by mouth every 6 (six) hours as needed for breakthrough pain or severe pain (pain score 7-10).   pantoprazole 40 MG tablet Commonly known as: PROTONIX Take 1 tablet (40 mg total) by mouth daily.   triamterene-hydrochlorothiazide 75-50 MG tablet Commonly known as: MAXZIDE TAKE 1 TABLET BY MOUTH EVERY DAY          The results of significant diagnostics from this hospitalization (including imaging, microbiology, ancillary and laboratory) are listed below for reference.    Significant Diagnostic Studies: No results found.  Labs: Basic Metabolic Panel: Recent Labs  Lab 09/06/23 0510  NA 138  K 4.0  CL 106  CO2 23  GLUCOSE 123*  BUN 11  CREATININE 0.76  CALCIUM 9.0   Liver Function Tests: Recent Labs  Lab 09/06/23 0510  AST 19  ALT 22  ALKPHOS 60  BILITOT 0.6  PROT 6.8  ALBUMIN 3.6    CBC: Recent Labs  Lab 09/05/23 1437 09/06/23 0510   WBC  --  8.8  NEUTROABS  --  7.1  HGB 13.0 11.8*  HCT 38.8 36.8  MCV  --  94.6  PLT  --  324    CBG: No results for input(s): "GLUCAP" in the last 168 hours.  Principal Problem:   S/P gastric bypass   Time coordinating discharge: 15 min  Signed:  Atilano Ina, MD [facility] [phone] 09/08/2023, 8:34 AM

## 2023-09-12 ENCOUNTER — Telehealth (HOSPITAL_COMMUNITY): Payer: Self-pay | Admitting: *Deleted

## 2023-09-12 NOTE — Telephone Encounter (Signed)
Pt called and left a voicemail asking for a return call.  Called pt back but call went to voicemail. Voice message left with call back number  Will attempt to call pt back later

## 2023-09-12 NOTE — Telephone Encounter (Signed)
1. Tell me about your pain and pain management?     Pt stated most of her discomfort is in her knees and lower extremeties due to OA  Encouraged pt to try getting into a comfortable position, standing, walking around as able and remaining upright, especially after eating/drinking, and/or contact [facility] if still concerned.    2. Let's talk about fluid intake. How much total fluid are you taking in?   Pt states that s/he is working to meet goal of 64 oz of fluid today, patient stated she is ready to get on to some "other food options". Pt encouraged to continue to work towards meeting goal. Pt stated she has been getting in at least one protein shake, Gatorade, and water. Pt instructed to assess status and suggestions utilizing Hydration Action Plan on discharge folder and to call [facility] if in the "red zone".      3. How much protein have you taken in the last day?    Pt states that she is working to meet the goal of 60g of protein today. Pt plans to drink the reminder of goal throughout the day to meet criteria.  Pt has already consumed 30g protein shake(s). Pt plans to drink remainder of protein throughout the rest of the day to meet goal.     4. Have you had nausea? Tell me about when you have experienced nausea and what you did to help?   Pt did not report nausea.   5. Has the frequency or color changed with your urine?   Pt states that s/he is urinating "fine" with no changes in frequency or urgency.   6. Tell me what your incisions look like?   "Incisions look fine, they are starting to itch a little". Pt denies a fever, chills. Pt states incisions are not swollen, open, or draining. Pt encouraged to call [facility] if incisions change.      7. Have you been passing gas? BM?    Pt states that they have had a BM. Pt instructed to take either Miralax or MoM as instructed per "Gastric Bypass/Sleeve Discharge Home Care Instructions". Pt to call surgeon's office if not able to have BM with  medication.      8. If a problem or question were to arise who would you call? Do you know contact numbers for [facility], [facility], and [facility]?   Pt knows to call [facility] for surgical, [facility] for nutrition, and [facility] for non-urgent questions or concerns. Pt denies dehydration symptoms. Pt can describe s/sx of dehydration.   9. How has the walking going?   Pt states it has been difficult due to OA, but she is trying and gets up when she has to go to the bathroom, encouraged pt to also do sitting active ROM exercises to help with both upper and lower extremities  10. Patient did not report any problems with breathing.    11. How are your vitamins and calcium going? How are you taking them?     Pt states that s/he is taking his/her supplements and vitamins without difficulty.

## 2023-09-20 ENCOUNTER — Encounter: Payer: Self-pay | Admitting: Dietician

## 2023-09-20 ENCOUNTER — Encounter: Payer: Medicare Other | Attending: General Surgery | Admitting: Dietician

## 2023-09-20 VITALS — Ht 66.5 in | Wt 301.1 lb

## 2023-09-20 DIAGNOSIS — E669 Obesity, unspecified: Secondary | ICD-10-CM | POA: Diagnosis present

## 2023-09-20 DIAGNOSIS — Z9884 Bariatric surgery status: Secondary | ICD-10-CM | POA: Diagnosis not present

## 2023-09-20 DIAGNOSIS — Z6841 Body Mass Index (BMI) 40.0 and over, adult: Secondary | ICD-10-CM | POA: Insufficient documentation

## 2023-09-20 DIAGNOSIS — Z48815 Encounter for surgical aftercare following surgery on the digestive system: Secondary | ICD-10-CM | POA: Insufficient documentation

## 2023-09-20 DIAGNOSIS — Z713 Dietary counseling and surveillance: Secondary | ICD-10-CM | POA: Insufficient documentation

## 2023-09-20 NOTE — Progress Notes (Signed)
2 Week Post-Operative Nutrition Class   Patient was seen on 09/20/2023 for Post-Operative Nutrition education at [facility]    Surgery date: 09/05/2023 Surgery type: RYGB  Anthropometrics  Start weight at [facility]: 310.2 lbs (date: 04/30/2022)  Height: 67 in Weight today: 301.1 lb   Clinical  Medical hx: Anxiety, arthritis, depression, GERD, HTN Medications: Megestrol, Mounjaro, buspirone oxybutynin, triamterene, venlafaxine XR  Labs: 12/03/2021: A1C 6.2 Notable signs/symptoms: none noted Any previous deficiencies? No Bowel Habits: Every day to every other day   Body Composition Scale 09/20/2023  Current Body Weight 301.1  Total Body Fat % 48.5  Visceral Fat 20  Fat-Free Mass % 51.4   Total Body Water % 40.2  Muscle-Mass lbs 33.6  BMI 48.0  Body Fat Displacement          Torso  lbs 90.7         Left Leg  lbs 18.1         Right Leg  lbs 18.1         Left Arm  lbs 9.0         Right Arm  lbs 9.0    The following the learning objectives were met by the patient during this course: Identifies Soft Prepped Plan Advancement Guide  Identifies Soft, High Proteins (Phase 1), beginning 2 weeks post-operatively to 3 weeks post-operatively Identifies Additional Soft High Proteins, soft non-starchy vegetables, fruits and starches (Phase 2), beginning 3 weeks post-operatively to 3 months post-operatively Identifies appropriate sources of fluids, proteins, vegetables, fruits and starches Identifies appropriate fat sources and healthy verses unhealthy fat types   States protein, vegetable, fruit and starch recommendations and appropriate sources post-operatively Identifies the need for appropriate texture modifications, mastication, and bite sizes when consuming solids Identifies appropriate fat consumption and sources Identifies appropriate multivitamin and calcium sources post-operatively Describes the need for physical activity post-operatively and will  follow MD recommendations States when to call healthcare provider regarding medication questions or post-operative complications   Handouts given during class include: Soft Prepped Plan Advancement Guide   Follow-Up Plan: Patient will follow-up at [facility] in 10 weeks for 3 month post-op nutrition visit for diet advancement per MD.

## 2023-09-21 ENCOUNTER — Ambulatory Visit: Payer: Medicare Other | Admitting: Family Medicine

## 2023-09-27 ENCOUNTER — Telehealth: Payer: Self-pay | Admitting: Dietician

## 2023-09-27 NOTE — Telephone Encounter (Signed)
RD called pt to verify fluid intake once starting soft, solid proteins 2 week post-bariatric surgery.   Daily Fluid intake:  Daily Protein intake:  Bowel Habits:   Concerns/issues:    Left Voice Message 

## 2023-10-18 ENCOUNTER — Other Ambulatory Visit (HOSPITAL_COMMUNITY): Payer: Self-pay | Admitting: Family Medicine

## 2023-10-18 ENCOUNTER — Other Ambulatory Visit (HOSPITAL_COMMUNITY)
Admission: RE | Admit: 2023-10-18 | Discharge: 2023-10-18 | Disposition: A | Discharge status: Home / Self Care | Payer: Medicare Other | Source: Ambulatory Visit | Attending: Family Medicine | Admitting: Family Medicine

## 2023-10-18 ENCOUNTER — Ambulatory Visit: Payer: Medicare Other | Admitting: Family Medicine

## 2023-10-18 ENCOUNTER — Encounter: Payer: Self-pay | Admitting: Family Medicine

## 2023-10-18 VITALS — BP 128/82 | HR 86 | Ht 67.0 in | Wt 295.0 lb

## 2023-10-18 DIAGNOSIS — F410 Panic disorder [episodic paroxysmal anxiety] without agoraphobia: Secondary | ICD-10-CM

## 2023-10-18 DIAGNOSIS — Z1231 Encounter for screening mammogram for malignant neoplasm of breast: Secondary | ICD-10-CM

## 2023-10-18 DIAGNOSIS — Z1151 Encounter for screening for human papillomavirus (HPV): Secondary | ICD-10-CM | POA: Diagnosis not present

## 2023-10-18 DIAGNOSIS — I10 Essential (primary) hypertension: Secondary | ICD-10-CM

## 2023-10-18 DIAGNOSIS — E118 Type 2 diabetes mellitus with unspecified complications: Secondary | ICD-10-CM | POA: Diagnosis not present

## 2023-10-18 DIAGNOSIS — Z124 Encounter for screening for malignant neoplasm of cervix: Secondary | ICD-10-CM

## 2023-10-18 DIAGNOSIS — Z01419 Encounter for gynecological examination (general) (routine) without abnormal findings: Secondary | ICD-10-CM | POA: Diagnosis present

## 2023-10-18 DIAGNOSIS — Z113 Encounter for screening for infections with a predominantly sexual mode of transmission: Secondary | ICD-10-CM | POA: Insufficient documentation

## 2023-10-18 DIAGNOSIS — Z9109 Other allergy status, other than to drugs and biological substances: Secondary | ICD-10-CM

## 2023-10-18 DIAGNOSIS — F411 Generalized anxiety disorder: Secondary | ICD-10-CM

## 2023-10-18 NOTE — Patient Instructions (Addendum)
Welcome to medicare in 8 to 10 weeks,  call if you need me sooner  Congrats on weight loss, keep it up  Pap today  cBC, chem 7 and eGFR today  HBA1C  , fasting lipid, tSH and vit d 1 week before next appointment  Please schedule mammogram at [facility]  Thanks for choosing [facility], we consider it a privelige to serve you.

## 2023-10-19 LAB — CBC
Hematocrit: 43.1 % (ref 34.0–46.6)
Hemoglobin: 13.9 g/dL (ref 11.1–15.9)
MCH: 30 pg (ref 26.6–33.0)
MCHC: 32.3 g/dL (ref 31.5–35.7)
MCV: 93 fL (ref 79–97)
Platelets: 431 10*3/uL (ref 150–450)
RBC: 4.63 x10E6/uL (ref 3.77–5.28)
RDW: 13.7 % (ref 11.7–15.4)
WBC: 6.7 10*3/uL (ref 3.4–10.8)

## 2023-10-19 LAB — BMP8+EGFR
BUN/Creatinine Ratio: 12 (ref 9–23)
BUN: 10 mg/dL (ref 6–24)
CO2: 20 mmol/L (ref 20–29)
Calcium: 9.9 mg/dL (ref 8.7–10.2)
Chloride: 102 mmol/L (ref 96–106)
Creatinine, Ser: 0.81 mg/dL (ref 0.57–1.00)
Glucose: 82 mg/dL (ref 70–99)
Potassium: 3.6 mmol/L (ref 3.5–5.2)
Sodium: 141 mmol/L (ref 134–144)
eGFR: 89 mL/min/{1.73_m2} (ref >59)

## 2023-10-20 LAB — MICROALBUMIN / CREATININE URINE RATIO
Creatinine, Urine: 260.6 mg/dL
Microalb/Creat Ratio: 81 mg/g{creat} — ABNORMAL HIGH (ref 0–29)
Microalbumin, Urine: 210.6 ug/mL

## 2023-10-21 LAB — CERVICOVAGINAL ANCILLARY ONLY
Bacterial Vaginitis (gardnerella): NEGATIVE
Candida Glabrata: POSITIVE — AB
Candida Vaginitis: NEGATIVE
Chlamydia: NEGATIVE
Comment: NEGATIVE
Comment: NEGATIVE
Comment: NEGATIVE
Comment: NEGATIVE
Comment: NEGATIVE
Comment: NORMAL
Neisseria Gonorrhea: NEGATIVE
Trichomonas: NEGATIVE

## 2023-10-24 LAB — CYTOLOGY - PAP
Comment: NEGATIVE
Diagnosis: NEGATIVE
High risk HPV: NEGATIVE

## 2023-11-04 ENCOUNTER — Other Ambulatory Visit: Payer: Self-pay | Admitting: Family Medicine

## 2023-11-10 DIAGNOSIS — Z113 Encounter for screening for infections with a predominantly sexual mode of transmission: Secondary | ICD-10-CM | POA: Insufficient documentation

## 2023-11-10 DIAGNOSIS — Z124 Encounter for screening for malignant neoplasm of cervix: Secondary | ICD-10-CM | POA: Insufficient documentation

## 2023-11-10 DIAGNOSIS — E118 Type 2 diabetes mellitus with unspecified complications: Secondary | ICD-10-CM | POA: Insufficient documentation

## 2023-11-10 NOTE — Assessment & Plan Note (Signed)
Pap sent ?

## 2023-11-10 NOTE — Assessment & Plan Note (Signed)
Pt requests STD screen, specimens sent for testing and she is reminded of condom use

## 2023-11-10 NOTE — Assessment & Plan Note (Signed)
Controlled, no change in medication DASH diet and commitment to daily physical activity for a minimum of 30 minutes discussed and encouraged, as a part of hypertension management. The importance of attaining a healthy weight is also discussed.     10/18/2023    3:39 PM 09/20/2023    5:30 PM 09/06/2023    9:34 AM 09/06/2023    5:38 AM 09/06/2023   12:13 AM 09/05/2023    8:59 PM 09/05/2023    5:51 PM  BP/Weight  Systolic BP 128  440 106 114 127 114  Diastolic BP 82  77 58 70 83 69  Wt. (Lbs) 295 301.1       BMI 46.2 kg/m2 47.87 kg/m2

## 2023-11-10 NOTE — Assessment & Plan Note (Addendum)
Diabetes associated with hypertension, anxiety and morbid obesity  Jennifer [name] is reminded of the importance of commitment to daily physical activity for 30 minutes or more, as able and the need to limit carbohydrate intake to 30 to 60 grams per meal to help with blood sugar control.   The need to take medication as prescribed, test blood sugar as directed, and to call between visits if there is a concern that blood sugar is uncontrolled is also discussed.   Jennifer [name] is reminded of the importance of daily foot exam, annual eye examination, and good blood sugar, blood pressure and cholesterol control.     Latest Ref Rng & Units 10/18/2023    4:30 PM 10/18/2023    4:27 PM 09/06/2023    5:10 AM 08/24/2023    1:23 PM 07/28/2023   11:32 AM  Diabetic Labs  Micro/Creat Ratio 0 - 29 mg/g creat 81       Creatinine 0.57 - 1.00 mg/dL  1.61  0.96  0.45  4.09       10/18/2023    3:39 PM 09/20/2023    5:30 PM 09/06/2023    9:34 AM 09/06/2023    5:38 AM 09/06/2023   12:13 AM 09/05/2023    8:59 PM 09/05/2023    5:51 PM  BP/Weight  Systolic BP 128  811 106 114 127 114  Diastolic BP 82  77 58 70 83 69  Wt. (Lbs) 295 301.1       BMI 46.2 kg/m2 47.87 kg/m2            No data to display

## 2023-11-10 NOTE — Progress Notes (Signed)
Jennifer [name]     MRN: [id number]      DOB: 02-Aug-1976  Chief Complaint  Patient presents with   Follow-up    Follow up Pap pt reports being on her cycle    HPI Jennifer [name] is here for follow up and re-evaluation of chronic medical conditions, medication management and review of any available recent lab and radiology data.  Preventive health is updated, specifically  Cancer screening and Immunization.   Had bariatric surgery and has had no complications and is losing weight well The PT denies any adverse reactions to current medications since the last visit.  ROS Denies recent fever or chills. Denies sinus pressure, nasal congestion, ear pain or sore throat. Denies chest congestion, productive cough or wheezing. Denies chest pains, palpitations and leg swelling Denies abdominal pain, nausea, vomiting,diarrhea or constipation.   Denies dysuria, frequency, hesitancy or incontinence. Chronic  joint pain, swelling and limitation in mobility. Denies headaches, seizures, numbness, or tingling. Denies depression, anxiety or insomnia. Denies skin break down or rash.   PE  BP 128/82 (BP Location: Right Arm, Patient Position: Sitting, Cuff Size: Large)   Pulse 86   Ht 5\' 7"  (1.702 m)   Wt 295 lb (133.8 kg)   SpO2 98%   BMI 46.20 kg/m   Patient alert and oriented and in no cardiopulmonary distress.  HEENT: No facial asymmetry, EOMI,     Neck supple .  Chest: Clear to auscultation bilaterally.  CVS: S1, S2 no murmurs, no S3.Regular rate.  ABD: Soft non tender.  GU: Normal external genitalia, Uterus appears normal size , bleeding noted in vag canal and from cervix, no cervical motion or adnexal tenderness. Due to habitus, unable to determmine presnce / absenmce of enlarged uterus or adnexal mass.   Ext: No edema  MS: Decreased  ROM spine, shoulders, hips and knees.  Skin: Intact, no ulcerations or rash noted.  Psych: Good eye contact, normal affect. Memory intact not  anxious or depressed appearing.  CNS: CN 2-12 intact, power,  normal throughout.no focal deficits noted.   Assessment & Plan  Environmental allergies Controlled, no change in medication   Essential hypertension Controlled, no change in medication DASH diet and commitment to daily physical activity for a minimum of 30 minutes discussed and encouraged, as a part of hypertension management. The importance of attaining a healthy weight is also discussed.     10/18/2023    3:39 PM 09/20/2023    5:30 PM 09/06/2023    9:34 AM 09/06/2023    5:38 AM 09/06/2023   12:13 AM 09/05/2023    8:59 PM 09/05/2023    5:51 PM  BP/Weight  Systolic BP 128  629 106 114 127 114  Diastolic BP 82  77 58 70 83 69  Wt. (Lbs) 295 301.1       BMI 46.2 kg/m2 47.87 kg/m2            Morbid obesity (HCC) Improved following bariartric surgery, doing very well  Patient re-educated about  the importance of commitment to a  minimum of 150 minutes of exercise per week as able.  The importance of healthy food choices with portion control discussed, as well as eating regularly and within a 12 hour window most days. The need to choose "clean , green" food 50 to 75% of the time is discussed, as well as to make water the primary drink and set a goal of 64 ounces water daily.  10/18/2023    3:39 PM 09/20/2023    5:30 PM 09/05/2023    5:59 AM  Weight /BMI  Weight 295 lb 301 lb 1.6 oz 317 lb  Height 5\' 7"  (1.702 m) 5' 6.5" (1.689 m) 5\' 7"  (1.702 m)  BMI 46.2 kg/m2 47.87 kg/m2 49.65 kg/m2      Routine cervical smear Pap sent  Screening for STD (sexually transmitted disease) Pt requests STD screen, specimens sent for testing and she is reminded of condom use  Controlled type 2 diabetes mellitus with complication, without long-term current use of insulin (HCC) Diabetes associated with hypertension, anxiety and morbid obesity  Jennifer [name] is reminded of the importance of commitment to daily physical  activity for 30 minutes or more, as able and the need to limit carbohydrate intake to 30 to 60 grams per meal to help with blood sugar control.   The need to take medication as prescribed, test blood sugar as directed, and to call between visits if there is a concern that blood sugar is uncontrolled is also discussed.   Jennifer [name] is reminded of the importance of daily foot exam, annual eye examination, and good blood sugar, blood pressure and cholesterol control.     Latest Ref Rng & Units 10/18/2023    4:30 PM 10/18/2023    4:27 PM 09/06/2023    5:10 AM 08/24/2023    1:23 PM 07/28/2023   11:32 AM  Diabetic Labs  Micro/Creat Ratio 0 - 29 mg/g creat 81       Creatinine 0.57 - 1.00 mg/dL  1.61  0.96  0.45  4.09       10/18/2023    3:39 PM 09/20/2023    5:30 PM 09/06/2023    9:34 AM 09/06/2023    5:38 AM 09/06/2023   12:13 AM 09/05/2023    8:59 PM 09/05/2023    5:51 PM  BP/Weight  Systolic BP 128  811 106 114 127 114  Diastolic BP 82  77 58 70 83 69  Wt. (Lbs) 295 301.1       BMI 46.2 kg/m2 47.87 kg/m2            No data to display              Generalized anxiety disorder with panic attacks Improved and managed by Psych

## 2023-11-10 NOTE — Assessment & Plan Note (Signed)
Improved following bariartric surgery, doing very well  Patient re-educated about  the importance of commitment to a  minimum of 150 minutes of exercise per week as able.  The importance of healthy food choices with portion control discussed, as well as eating regularly and within a 12 hour window most days. The need to choose "clean , green" food 50 to 75% of the time is discussed, as well as to make water the primary drink and set a goal of 64 ounces water daily.       10/18/2023    3:39 PM 09/20/2023    5:30 PM 09/05/2023    5:59 AM  Weight /BMI  Weight 295 lb 301 lb 1.6 oz 317 lb  Height 5\' 7"  (1.702 m) 5' 6.5" (1.689 m) 5\' 7"  (1.702 m)  BMI 46.2 kg/m2 47.87 kg/m2 49.65 kg/m2

## 2023-11-10 NOTE — Assessment & Plan Note (Signed)
Controlled, no change in medication  

## 2023-11-10 NOTE — Assessment & Plan Note (Signed)
Improved and managed by Psych ?

## 2023-11-20 ENCOUNTER — Other Ambulatory Visit: Payer: Self-pay | Admitting: Family Medicine

## 2023-11-27 ENCOUNTER — Other Ambulatory Visit (HOSPITAL_COMMUNITY): Payer: Self-pay | Admitting: Psychiatry

## 2023-11-27 DIAGNOSIS — F50819 Binge eating disorder, unspecified: Secondary | ICD-10-CM

## 2023-11-27 DIAGNOSIS — F410 Panic disorder [episodic paroxysmal anxiety] without agoraphobia: Secondary | ICD-10-CM

## 2023-11-27 DIAGNOSIS — F4310 Post-traumatic stress disorder, unspecified: Secondary | ICD-10-CM

## 2023-11-28 ENCOUNTER — Telehealth (HOSPITAL_COMMUNITY): Payer: Self-pay | Admitting: *Deleted

## 2023-11-28 NOTE — Telephone Encounter (Signed)
 Per Dr. Mercy to please call patient and schedule a follow up appt for patient due to pharmacy requesting refill for patient medication. Staff called patient to schedule an appt due to patient not being seen sine Sept 2023. Staff was not able to reach patient and staff left message for patient to call office and office number was provided on voicemail as well.

## 2023-12-06 ENCOUNTER — Ambulatory Visit: Payer: Medicare Other | Admitting: Dietician

## 2023-12-15 ENCOUNTER — Encounter: Payer: 59 | Admitting: Family Medicine

## 2023-12-16 ENCOUNTER — Encounter (HOSPITAL_COMMUNITY): Payer: Self-pay | Admitting: Psychiatry

## 2023-12-16 ENCOUNTER — Telehealth (INDEPENDENT_AMBULATORY_CARE_PROVIDER_SITE_OTHER): Payer: 59 | Admitting: Psychiatry

## 2023-12-16 DIAGNOSIS — F50814 Binge eating disorder, in remission: Secondary | ICD-10-CM

## 2023-12-16 DIAGNOSIS — F411 Generalized anxiety disorder: Secondary | ICD-10-CM

## 2023-12-16 DIAGNOSIS — F4310 Post-traumatic stress disorder, unspecified: Secondary | ICD-10-CM | POA: Diagnosis not present

## 2023-12-16 DIAGNOSIS — R0683 Snoring: Secondary | ICD-10-CM

## 2023-12-16 DIAGNOSIS — F5105 Insomnia due to other mental disorder: Secondary | ICD-10-CM

## 2023-12-16 DIAGNOSIS — G4733 Obstructive sleep apnea (adult) (pediatric): Secondary | ICD-10-CM

## 2023-12-16 DIAGNOSIS — F331 Major depressive disorder, recurrent, moderate: Secondary | ICD-10-CM | POA: Diagnosis not present

## 2023-12-16 DIAGNOSIS — R5382 Chronic fatigue, unspecified: Secondary | ICD-10-CM

## 2023-12-16 DIAGNOSIS — F99 Mental disorder, not otherwise specified: Secondary | ICD-10-CM

## 2023-12-16 DIAGNOSIS — F410 Panic disorder [episodic paroxysmal anxiety] without agoraphobia: Secondary | ICD-10-CM

## 2023-12-16 MED ORDER — FLUOXETINE HCL 40 MG PO CAPS: 80.0000 mg | ORAL_CAPSULE | Freq: Every day | ORAL | 3 refills | Status: DC

## 2023-12-16 NOTE — Patient Instructions (Signed)
We increased the fluoxetine to 80mg  once daily today.
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
## 2023-12-19 ENCOUNTER — Ambulatory Visit: Payer: 59 | Admitting: Dietician

## 2023-12-20 ENCOUNTER — Other Ambulatory Visit (HOSPITAL_COMMUNITY): Payer: Self-pay

## 2023-12-20 ENCOUNTER — Other Ambulatory Visit: Payer: Self-pay | Admitting: Family Medicine

## 2023-12-20 MED ORDER — PANTOPRAZOLE SODIUM 40 MG PO TBEC
40.0000 mg | DELAYED_RELEASE_TABLET | Freq: Every day | ORAL | 0 refills | Status: DC
  Filled 2023-12-20: qty 90, 90d supply, fill #0

## 2023-12-22 ENCOUNTER — Encounter: Payer: 59 | Attending: General Surgery | Admitting: Dietician

## 2023-12-22 ENCOUNTER — Encounter: Payer: Self-pay | Admitting: Dietician

## 2023-12-22 VITALS — Ht 67.0 in | Wt 285.3 lb

## 2023-12-22 DIAGNOSIS — Z713 Dietary counseling and surveillance: Secondary | ICD-10-CM | POA: Insufficient documentation

## 2023-12-22 DIAGNOSIS — E669 Obesity, unspecified: Secondary | ICD-10-CM | POA: Diagnosis present

## 2023-12-22 DIAGNOSIS — Z6841 Body Mass Index (BMI) 40.0 and over, adult: Secondary | ICD-10-CM | POA: Insufficient documentation

## 2023-12-22 NOTE — Progress Notes (Signed)
 Bariatric Nutrition Follow-Up Visit Medical Nutrition Therapy  Appt Start Time: 1129   End Time: 1217  Surgery date: 09/05/2023 Surgery type: RYGB  Anthropometrics  Start weight at [facility]: 310.2 lbs (date: 04/30/2022)  Height: 67 in Weight today: 285.3 lb   Clinical  Medical hx: Anxiety, arthritis, depression, GERD, HTN Medications: Megestrol , Mounjaro , buspirone  oxybutynin , triamterene , venlafaxine  XR  Labs: 12/03/2021: A1C 6.2 Notable signs/symptoms: none noted Any previous deficiencies? No Bowel Habits: Every day to every other day   Body Composition Scale 09/20/2023 12/22/2023  Current Body Weight 301.1 285.3  Total Body Fat % 48.5 46.9  Visceral Fat 20 18  Fat-Free Mass % 51.4 53.0   Total Body Water  % 40.2 41.0  Muscle-Mass lbs 33.6 34.0  BMI 48.0 44.4  Body Fat Displacement           Torso  lbs 90.7 86.1         Left Leg  lbs 18.1 16.6         Right Leg  lbs 18.1 16.6         Left Arm  lbs 9.0 8.3         Right Arm  lbs 9.0 8.3     Lifestyle & Dietary Hx  Pt arrived with her friend, who is supportive. Friend states she cooks for the patient. Pt states she does not have much of an appetite, stating lately her stomach hurts a little bit. Pt states she is still having a texture issue with food, stating she may want it when it is time to eat, but states when she puts it in her mouth, she doesn't want it anymore. Pt states a milky way candy did not bother her. Pt states she can have knee surgery when she gets to a BMI of 40 or less.  Estimated daily fluid intake: 60 oz Estimated daily protein intake: 30-60 g Supplements: multivitamin and calcium (not always taking calcium) Current average weekly physical activity: daily, 5-10 minutes physical activity in a chair or bed  24-Hr Dietary Recall First Meal: protein shake, water , watermelon or banana Snack:   Second Meal: protein shake or broccoli, chicken Snack:   Third Meal: salad or spinach with crab meat or  ham/turkey or piece of chicken Snack:  Beverages: water , un sweet tea, Gatorade zero, decaf coffee  Post-Op Goals/ Signs/ Symptoms Using straws: sometimes Drinking while eating: no Chewing/swallowing difficulties: no Changes in vision: no Changes to mood/headaches: no Hair loss/changes to skin/nails: no Difficulty focusing/concentrating: no Sweating: no Limb weakness: no Dizziness/lightheadedness: no Palpitations: no  Carbonated/caffeinated beverages: no N/V/D/C/Gas:  Abdominal pain: no Dumping syndrome: no   NUTRITION DIAGNOSIS  Overweight/obesity (Bolt-3.3) related to past poor dietary habits and physical inactivity as evidenced by completed bariatric surgery and following dietary guidelines for continued weight loss and healthy nutrition status.   NUTRITION INTERVENTION Nutrition counseling (C-1) and education (E-2) to facilitate bariatric surgery goals, including: Diet advancement to the standard prep plan The importance of consuming adequate calories as well as certain nutrients daily due to the body's need for essential vitamins, minerals, and fats The importance of daily physical activity and to reach a goal of at least 150 minutes of moderate to vigorous physical activity weekly (or as directed by their physician) due to benefits such as increased musculature and improved lab values The importance of intuitive eating specifically learning hunger-satiety cues and understanding the importance of learning a new body: The importance of mindful eating to avoid grazing behaviors  Goals New:  aim smaller more frequent meals New: aim for 60 grams of protein from food; supplement with a protein shake if not meeting protein goal with food   Handouts Provided Include  Standard Prep Plan Advancement Guide Body Comp Scale readout  Learning Style & Readiness for Change Teaching method utilized: Visual & Auditory  Demonstrated degree of understanding via: Teach Back  Readiness Level:  preparation Barriers to learning/adherence to lifestyle change: mobility  RD's Notes for Next Visit Assess adherence to pt chosen goals  MONITORING & EVALUATION Dietary intake, weekly physical activity, body weight.  Next Steps Patient is to follow-up in 3 months for 6 month post-op follow-up.

## 2024-01-05 ENCOUNTER — Other Ambulatory Visit (HOSPITAL_COMMUNITY): Payer: Self-pay | Admitting: Psychiatry

## 2024-01-05 DIAGNOSIS — F50819 Binge eating disorder, unspecified: Secondary | ICD-10-CM

## 2024-01-05 DIAGNOSIS — F4310 Post-traumatic stress disorder, unspecified: Secondary | ICD-10-CM

## 2024-01-05 DIAGNOSIS — F410 Panic disorder [episodic paroxysmal anxiety] without agoraphobia: Secondary | ICD-10-CM

## 2024-01-15 ENCOUNTER — Other Ambulatory Visit (HOSPITAL_COMMUNITY): Payer: Self-pay | Admitting: Psychiatry

## 2024-01-15 DIAGNOSIS — F410 Panic disorder [episodic paroxysmal anxiety] without agoraphobia: Secondary | ICD-10-CM

## 2024-01-15 DIAGNOSIS — F50814 Binge eating disorder, in remission: Secondary | ICD-10-CM

## 2024-01-15 DIAGNOSIS — F331 Major depressive disorder, recurrent, moderate: Secondary | ICD-10-CM

## 2024-01-15 DIAGNOSIS — F4310 Post-traumatic stress disorder, unspecified: Secondary | ICD-10-CM

## 2024-01-16 ENCOUNTER — Encounter (HOSPITAL_COMMUNITY): Payer: Self-pay

## 2024-01-16 ENCOUNTER — Ambulatory Visit (HOSPITAL_COMMUNITY)
Admission: RE | Admit: 2024-01-16 | Discharge: 2024-01-16 | Disposition: A | Discharge status: Home / Self Care | Payer: Medicare Other | Source: Ambulatory Visit | Attending: Family Medicine | Admitting: Family Medicine

## 2024-01-16 DIAGNOSIS — Z1231 Encounter for screening mammogram for malignant neoplasm of breast: Secondary | ICD-10-CM | POA: Diagnosis not present

## 2024-01-23 ENCOUNTER — Encounter (HOSPITAL_COMMUNITY): Payer: Self-pay | Admitting: Psychiatry

## 2024-01-23 ENCOUNTER — Telehealth (INDEPENDENT_AMBULATORY_CARE_PROVIDER_SITE_OTHER): Payer: 59 | Admitting: Psychiatry

## 2024-01-23 DIAGNOSIS — G4733 Obstructive sleep apnea (adult) (pediatric): Secondary | ICD-10-CM

## 2024-01-23 DIAGNOSIS — F331 Major depressive disorder, recurrent, moderate: Secondary | ICD-10-CM

## 2024-01-23 DIAGNOSIS — F410 Panic disorder [episodic paroxysmal anxiety] without agoraphobia: Secondary | ICD-10-CM

## 2024-01-23 DIAGNOSIS — F4310 Post-traumatic stress disorder, unspecified: Secondary | ICD-10-CM | POA: Diagnosis not present

## 2024-01-23 DIAGNOSIS — F50814 Binge eating disorder, in remission: Secondary | ICD-10-CM | POA: Diagnosis not present

## 2024-01-23 DIAGNOSIS — F411 Generalized anxiety disorder: Secondary | ICD-10-CM | POA: Diagnosis not present

## 2024-01-23 DIAGNOSIS — R5382 Chronic fatigue, unspecified: Secondary | ICD-10-CM
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
## 2024-01-23 NOTE — Patient Instructions (Signed)
 We did not make any medication changes today but I would encourage you to reach out to your primary care provider or your orthopedist to discuss the reinjury to your leg.

## 2024-03-17 ENCOUNTER — Other Ambulatory Visit: Payer: Self-pay | Admitting: Family Medicine

## 2024-03-19 ENCOUNTER — Other Ambulatory Visit: Payer: Self-pay

## 2024-03-19 ENCOUNTER — Other Ambulatory Visit (HOSPITAL_COMMUNITY): Payer: Self-pay

## 2024-03-19 ENCOUNTER — Telehealth (HOSPITAL_COMMUNITY): Admitting: Psychiatry

## 2024-03-19 MED ORDER — PANTOPRAZOLE SODIUM 40 MG PO TBEC
40.0000 mg | DELAYED_RELEASE_TABLET | Freq: Every day | ORAL | 0 refills | Status: DC
  Filled 2024-03-19 (×2): qty 90, 90d supply, fill #0

## 2024-03-20 ENCOUNTER — Ambulatory Visit: Payer: 59 | Admitting: Dietician

## 2024-03-23 ENCOUNTER — Encounter (HOSPITAL_COMMUNITY): Payer: Self-pay | Admitting: Psychiatry

## 2024-03-23 ENCOUNTER — Telehealth (INDEPENDENT_AMBULATORY_CARE_PROVIDER_SITE_OTHER): Admitting: Psychiatry

## 2024-03-23 DIAGNOSIS — F50814 Binge eating disorder, in remission: Secondary | ICD-10-CM | POA: Diagnosis not present

## 2024-03-23 DIAGNOSIS — F331 Major depressive disorder, recurrent, moderate: Secondary | ICD-10-CM | POA: Diagnosis not present

## 2024-03-23 DIAGNOSIS — R5382 Chronic fatigue, unspecified: Secondary | ICD-10-CM

## 2024-03-23 DIAGNOSIS — F4310 Post-traumatic stress disorder, unspecified: Secondary | ICD-10-CM

## 2024-03-23 DIAGNOSIS — F410 Panic disorder [episodic paroxysmal anxiety] without agoraphobia: Secondary | ICD-10-CM

## 2024-03-23 DIAGNOSIS — F411 Generalized anxiety disorder: Secondary | ICD-10-CM | POA: Diagnosis not present

## 2024-03-23 DIAGNOSIS — R0683 Snoring: Secondary | ICD-10-CM

## 2024-03-23 MED ORDER — FLUOXETINE HCL 40 MG PO CAPS: 80.0000 mg | ORAL_CAPSULE | Freq: Every day | ORAL | 1 refills | Status: DC

## 2024-03-23 NOTE — Patient Instructions (Signed)
 We did not make any medication changes today.  Be on the lookout for a phone call from my front desk and they should be able to get you in with one of the therapists for psychotherapy.
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
## 2024-03-29 ENCOUNTER — Ambulatory Visit: Admitting: Dietician

## 2024-04-03 DIAGNOSIS — G8929 Other chronic pain: Secondary | ICD-10-CM | POA: Diagnosis not present

## 2024-04-03 DIAGNOSIS — Z9884 Bariatric surgery status: Secondary | ICD-10-CM | POA: Diagnosis not present

## 2024-04-03 DIAGNOSIS — M545 Low back pain, unspecified: Secondary | ICD-10-CM | POA: Diagnosis not present

## 2024-04-03 DIAGNOSIS — E1169 Type 2 diabetes mellitus with other specified complication: Secondary | ICD-10-CM | POA: Diagnosis not present

## 2024-04-03 DIAGNOSIS — M170 Bilateral primary osteoarthritis of knee: Secondary | ICD-10-CM | POA: Diagnosis not present

## 2024-04-03 DIAGNOSIS — I10 Essential (primary) hypertension: Secondary | ICD-10-CM | POA: Diagnosis not present

## 2024-04-05 ENCOUNTER — Ambulatory Visit (INDEPENDENT_AMBULATORY_CARE_PROVIDER_SITE_OTHER): Payer: 59 | Admitting: Family Medicine

## 2024-04-05 ENCOUNTER — Encounter: Payer: Self-pay | Admitting: Family Medicine

## 2024-04-05 VITALS — BP 130/90 | HR 88 | Resp 16 | Ht 66.0 in | Wt 271.0 lb

## 2024-04-05 DIAGNOSIS — E559 Vitamin D deficiency, unspecified: Secondary | ICD-10-CM

## 2024-04-05 DIAGNOSIS — D511 Vitamin B12 deficiency anemia due to selective vitamin B12 malabsorption with proteinuria: Secondary | ICD-10-CM | POA: Diagnosis not present

## 2024-04-05 DIAGNOSIS — N926 Irregular menstruation, unspecified: Secondary | ICD-10-CM | POA: Diagnosis not present

## 2024-04-05 DIAGNOSIS — G4733 Obstructive sleep apnea (adult) (pediatric): Secondary | ICD-10-CM

## 2024-04-05 DIAGNOSIS — I10 Essential (primary) hypertension: Secondary | ICD-10-CM | POA: Diagnosis not present

## 2024-04-05 DIAGNOSIS — Z0001 Encounter for general adult medical examination with abnormal findings: Secondary | ICD-10-CM

## 2024-04-05 DIAGNOSIS — Z0000 Encounter for general adult medical examination without abnormal findings: Secondary | ICD-10-CM | POA: Insufficient documentation

## 2024-04-05 DIAGNOSIS — E118 Type 2 diabetes mellitus with unspecified complications: Secondary | ICD-10-CM | POA: Diagnosis not present

## 2024-04-05 MED ORDER — TIRZEPATIDE 5 MG/0.5ML ~~LOC~~ SOAJ: 5.0000 mg | SUBCUTANEOUS | 1 refills | Status: DC

## 2024-04-05 MED ORDER — TIRZEPATIDE 2.5 MG/0.5ML ~~LOC~~ SOAJ: 2.5000 mg | SUBCUTANEOUS | 0 refills | Status: DC

## 2024-04-05 NOTE — Assessment & Plan Note (Signed)
Menses.
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
## 2024-04-05 NOTE — Assessment & Plan Note (Signed)
 Welcome to medicare  exam as documented. Counseling done  re healthy lifestyle involving commitment to 150 minutes exercise per week, heart healthy diet, and attaining healthy weight.The importance of adequate sleep also discussed.

## 2024-04-05 NOTE — Progress Notes (Signed)
 Subjective:    Jennifer [name] is a 48 y.o. female who presents for a Welcome to Medicare exam.          Objective:     There were no vitals filed for this visit.There is no height or weight on file to calculate BMI.  Medications Outpatient Encounter Medications as of 04/05/2024  Medication Sig   ACCU-CHEK GUIDE test strip USE TO CHECK SUGAR UP TO 4 TIMES DAILY AS DIRECTED E10.9 E11.9   amLODipine  (NORVASC ) 5 MG tablet TAKE 1 TABLET (5 MG TOTAL) BY MOUTH DAILY.   Calcium-Vitamin D -Vitamin K (CALCIUM SOFT CHEWS PO) Take 1,500 mg by mouth daily.   FLUoxetine  (PROZAC ) 40 MG capsule Take 2 capsules (80 mg total) by mouth daily.   fluticasone  (FLONASE ) 50 MCG/ACT nasal spray SPRAY 2 SPRAYS INTO EACH NOSTRIL EVERY DAY (Patient taking differently: Place 2 sprays into both nostrils daily as needed for allergies.)   gabapentin  (NEURONTIN ) 300 MG capsule TAKE 1 CAPSULE BY MOUTH EVERYDAY AT BEDTIME   levocetirizine (XYZAL ) 5 MG tablet TAKE 1 TABLET BY MOUTH EVERY DAY IN THE EVENING   Multiple Vitamin (MULTIVITAMIN) LIQD Take 5 mLs by mouth daily.   norethindrone  (MICRONOR ) 0.35 MG tablet TAKE 1 TABLET BY MOUTH EVERY DAY   pantoprazole  (PROTONIX ) 40 MG tablet Take 1 tablet (40 mg total) by mouth daily.   No facility-administered encounter medications on file as of 04/05/2024.     History: Past Medical History:  Diagnosis Date   Abnormal vaginal Pap smear    Acid reflux    Alopecia    Anxiety    Arthritis    Cough 09/13/2022   Depression    Depression, major, single episode, severe (HCC) 06/18/2019   pHQ 9 score of 18 in 05/2019, not suicidal or homicidal  PHQ 9 score of 13 in 03/2021  Score of 21 in 11/2021, score of 7 in 04/2022, score of 20 in 06/2022   Diverticulosis    Family history of diabetes mellitus    GERD (gastroesophageal reflux disease)    Hemorrhoids    History of recurrent UTIs    Hypertension    Obesity    Pre-diabetes    PTSD (post-traumatic stress disorder)     Sleep apnea    Mild OSA,  No CPAP   Tuberculosis 2005   treated, still tests positive   Past Surgical History:  Procedure Laterality Date   APPENDECTOMY     BREAST BIOPSY Left 01/24/2023   BENIGN BREAST TISSUE WITH FAT NECROSIS, ADENOSIS AND CALCIFICATIONS//MM LT BREAST BX W LOC DEV 1ST LESION IMAGE BX SPEC STEREO GUIDE 01/24/2023 [facility]   COLONOSCOPY N/A 03/13/2014   Dr. Riley Cheadle- grade 3 hemorrhoids, colonic diverticulosis bx= benign lymphoid polyp   COLONOSCOPY WITH PROPOFOL  N/A 01/26/2021   Procedure: COLONOSCOPY WITH PROPOFOL ;  Surgeon: Suzette Espy, MD;  Location: [facility];  Service: Endoscopy;  Laterality: N/A;  AM   GASTRIC ROUX-EN-Y N/A 09/05/2023   Procedure: LAPAROSCOPIC ROUX-EN-Y GASTRIC BYPASS WITH UPPER ENDOSCOPY;  Surgeon: Aldean Hummingbird, MD;  Location: [facility];  Service: General;  Laterality: N/A;   KNEE ARTHROSCOPY     right   POLYPECTOMY  01/26/2021   Procedure: POLYPECTOMY;  Surgeon: Suzette Espy, MD;  Location: [facility];  Service: Endoscopy;;    Family History  Adopted: Yes  Problem Relation Age of Onset   Hypertension Mother    Diabetes Mother    Other Mother  cholangiocarcinoma, age 63, deceased   Breast cancer Maternal Aunt    Colon cancer Neg Hx    Tuberous sclerosis Neg Hx    Alpha-1 antitrypsin deficiency Neg Hx    Social History   Occupational History   Occupation: UNEMPLOYWED SINCE 10/2010  Tobacco Use   Smoking status: Never   Smokeless tobacco: Never  Vaping Use   Vaping status: Never Used  Substance and Sexual Activity   Alcohol use: No   Drug use: Not Currently   Sexual activity: Not Currently    Birth control/protection: Pill    Tobacco Counseling Counseling given: Not Answered   Immunizations and Health Maintenance Immunization History  Administered Date(s) Administered   Influenza Split 08/13/2016   Influenza, Seasonal, Injecte, Preservative Fre 07/26/2023   Influenza,inj,Quad PF,6+ Mos 07/10/2014,  10/02/2015, 09/01/2017, 10/19/2018, 07/26/2019, 07/18/2020, 09/09/2021, 07/02/2022   Moderna Sars-Covid-2 Vaccination 01/26/2020, 02/27/2020   PNEUMOCOCCAL CONJUGATE-20 09/09/2021   Td 07/23/2009   Tdap 05/27/2020   Health Maintenance Due  Topic Date Due   Medicare Annual Wellness (AWV)  Never done   COVID-19 Vaccine (3 - Moderna risk series) 03/26/2020   HEMOGLOBIN A1C  01/23/2024    Activities of Daily Living    04/03/2024   11:37 AM 09/05/2023   11:26 AM  In your present state of health, do you have any difficulty performing the following activities:  Hearing? 0 0  Vision? 0 0  Difficulty concentrating or making decisions? 1 0  Walking or climbing stairs? 1   Dressing or bathing? 0   Doing errands, shopping? 1 0  Preparing Food and eating ? N   Using the Toilet? N   In the past six months, have you accidently leaked urine? N   Do you have problems with loss of bowel control? N   Managing your Medications? N   Managing your Finances? N   Housekeeping or managing your Housekeeping? Y     Physical Exam   BP 138/85   Pulse 88   Resp 16   Ht 5\' 6"  (1.676 m)   Wt 271 lb (122.9 kg)   LMP 03/26/2024 (Approximate)   SpO2 95%   BMI 43.74 kg/m   Physical Exam (optional), or other factors deemed appropriate based on the beneficiary's medical and social history and current clinical standards.   Advanced Directives:  Discussed and info provided , pt plans to review, currently has none   EKG:  normal sinus rhythm, unchanged from previous tracings, poor r wave progression, no acute ischemia     Assessment:     This is a routine wellness examination for this patient . Jennifer [name]  Vision/Hearing screen No results found.   Goals       Depressive Symptoms Identified. Manage Depression Symptoms. Manage Anxiety Symptoms (pt-stated)      Time Frame:  Short Term Goal Priority:  Medium  Progress:  On Track  Start Date:  02/22/22    Expected End Date: 08/05/22     Follow Up Date:  client to receive  call from member of Managed Medicaid team within 30 days.    Depressive Symptoms Identified.  Manage Depression Symptoms. Manage Anxiety symptoms   Patient Self Care Activities:  Attends medical appointments  Patient Coping Strengths:  Has emotional support from nephew, Shellyann Wandrey  Patient Self Care Deficits:  Financial challenges Sleep issues Depression issues Anxiety issues  Patient Goals:  - spend time or talk with others at least 2 to 3 times per week - practice  relaxation or meditation daily - keep a calendar with appointment dates  Follow Up Plan: client to receive call from member of Managed Medicaid team within 30 days       Find Help in My Community (pt-stated)      Timeframe:  Short-Term Goal Priority:  High Start Date:    05/08/21                         Expected End Date:  06/05/2021          Follow Up Date 05/22/2021  -expect a call from our Care Guide to link you with community resources/support -follow up with current agency support for additional support (help with [facility]?) -follow up with your counselor to schedule next visit asap -continue taking meds as prescribed -call 911 for any medical or mental health emergencies - begin a notebook of services in my neighborhood or community - call 211 when I need some help - follow-up on any referrals for help I am given - think ahead to make sure my need does not become an emergency - make a note about what I need to have by the phone or take with me, like an identification card or social security number have a back-up plan - have a back-up plan - make a list of family or friends that I can call    Why is this important?   Knowing how and where to find help for yourself or family in your neighborhood and community is an important skill.  You will want to take some steps to learn how.    Notes:         Depression Screen    07/26/2023    2:18 PM 02/16/2023   10:09 AM  01/03/2023    2:34 PM 12/23/2022    9:05 AM  PHQ 2/9 Scores  PHQ - 2 Score 4 2 0 3  PHQ- 9 Score 9 15 0 15     Fall Risk    04/03/2024   11:37 AM  Fall Risk   Falls in the past year? 1  Number falls in past yr: 0  Injury with Fall? 0    Cognitive Function:        Patient Care Team: Towanda Fret, MD as PCP - General (Family Medicine) Amanda Jungling Tomas Fountain, MD as PCP - Cardiology (Cardiology) Riley Cheadle Windsor Hatcher, MD as Consulting Physician (Gastroenterology) Ivery Marking, MD as Consulting Physician (Obstetrics and Gynecology) Suann Elms, LCSW as Social Worker (Licensed Clinical Social Worker) Hunt Magyar as Social Worker     Plan:   Controlled type 2 diabetes mellitus with complication, without long-term current use of insulin  (HCC) Menses   Irregular menses 2 month history despite OCP also heavy 10 days , 3 days flooding refer Gyne  Encounter for Medicare annual wellness exam Welcome to medicare  exam as documented. Counseling done  re healthy lifestyle involving commitment to 150 minutes exercise per week, heart healthy diet, and attaining healthy weight.The importance of adequate sleep also discussed.    I have personally reviewed and noted the following in the patient's chart:   Medical and social history Use of alcohol, tobacco or illicit drugs  Current medications and supplements including opioid prescriptions. Patient is not currently taking opioid prescriptions. Functional ability and status Nutritional status Physical activity Advanced directives List of other physicians Hospitalizations, surgeries, and ER visits in previous 12 months Vitals Screenings to include cognitive, depression, and  falls Referrals and appointments  In addition, I have reviewed and discussed with patient certain preventive protocols, quality metrics, and best practice recommendations. A written personalized care plan for preventive services as well as general  preventive health recommendations were provided to patient.     Alberteen Huge, MD 04/05/2024

## 2024-04-05 NOTE — Patient Instructions (Addendum)
 F/U in 8 weeks, call if you need me sooner  Labs today CBC, B12, lipid, cmp and EGFr, hBa1C TSH and vit D  Nurse pls  give paperwork on living will  you are referred to [employer] choice is crucial  Thanks for choosing  [facility], we consider it a privelige to serve you.

## 2024-04-06 ENCOUNTER — Ambulatory Visit: Payer: Self-pay | Admitting: Family Medicine

## 2024-04-06 LAB — CMP14+EGFR
ALT: 19 IU/L (ref 0–32)
AST: 18 IU/L (ref 0–40)
Albumin: 4.3 g/dL (ref 3.9–4.9)
Alkaline Phosphatase: 135 IU/L — ABNORMAL HIGH (ref 44–121)
BUN/Creatinine Ratio: 11 (ref 9–23)
BUN: 10 mg/dL (ref 6–24)
Bilirubin Total: 0.3 mg/dL (ref 0.0–1.2)
CO2: 21 mmol/L (ref 20–29)
Calcium: 9.5 mg/dL (ref 8.7–10.2)
Chloride: 100 mmol/L (ref 96–106)
Creatinine, Ser: 0.91 mg/dL (ref 0.57–1.00)
Globulin, Total: 3 g/dL (ref 1.5–4.5)
Glucose: 78 mg/dL (ref 70–99)
Potassium: 4 mmol/L (ref 3.5–5.2)
Sodium: 138 mmol/L (ref 134–144)
Total Protein: 7.3 g/dL (ref 6.0–8.5)
eGFR: 77 mL/min/{1.73_m2} (ref >59)

## 2024-04-06 LAB — VITAMIN B12: Vitamin B-12: >2000 pg/mL — ABNORMAL HIGH (ref 232–1245)

## 2024-04-06 LAB — CBC WITH DIFFERENTIAL/PLATELET
Basophils Absolute: 0 10*3/uL (ref 0.0–0.2)
Basos: 1 %
EOS (ABSOLUTE): 0.1 10*3/uL (ref 0.0–0.4)
Eos: 2 %
Hematocrit: 42.3 % (ref 34.0–46.6)
Hemoglobin: 14 g/dL (ref 11.1–15.9)
Immature Grans (Abs): 0 10*3/uL (ref 0.0–0.1)
Immature Granulocytes: 0 %
Lymphocytes Absolute: 2.8 10*3/uL (ref 0.7–3.1)
Lymphs: 41 %
MCH: 30 pg (ref 26.6–33.0)
MCHC: 33.1 g/dL (ref 31.5–35.7)
MCV: 91 fL (ref 79–97)
Monocytes Absolute: 0.4 10*3/uL (ref 0.1–0.9)
Monocytes: 6 %
Neutrophils Absolute: 3.5 10*3/uL (ref 1.4–7.0)
Neutrophils: 50 %
Platelets: 376 10*3/uL (ref 150–450)
RBC: 4.66 x10E6/uL (ref 3.77–5.28)
RDW: 14 % (ref 11.7–15.4)
WBC: 6.8 10*3/uL (ref 3.4–10.8)

## 2024-04-06 LAB — HEMOGLOBIN A1C
Est. average glucose Bld gHb Est-mCnc: 108 mg/dL
Hgb A1c MFr Bld: 5.4 % (ref 4.8–5.6)

## 2024-04-06 LAB — VITAMIN D 25 HYDROXY (VIT D DEFICIENCY, FRACTURES): Vit D, 25-Hydroxy: 42.7 ng/mL (ref 30.0–100.0)

## 2024-04-06 LAB — LIPID PANEL
Chol/HDL Ratio: 2.6 ratio (ref 0.0–4.4)
Cholesterol, Total: 154 mg/dL (ref 100–199)
HDL: 59 mg/dL (ref >39)
LDL Chol Calc (NIH): 82 mg/dL (ref 0–99)
Triglycerides: 65 mg/dL (ref 0–149)
VLDL Cholesterol Cal: 13 mg/dL (ref 5–40)

## 2024-04-06 LAB — TSH: TSH: 1.55 u[IU]/mL (ref 0.450–4.500)

## 2024-04-20 ENCOUNTER — Ambulatory Visit (HOSPITAL_COMMUNITY): Admitting: Clinical

## 2024-04-20 DIAGNOSIS — F411 Generalized anxiety disorder: Secondary | ICD-10-CM | POA: Diagnosis not present

## 2024-04-20 DIAGNOSIS — F332 Major depressive disorder, recurrent severe without psychotic features: Secondary | ICD-10-CM

## 2024-04-20 DIAGNOSIS — F410 Panic disorder [episodic paroxysmal anxiety] without agoraphobia: Secondary | ICD-10-CM
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
## 2024-04-25 ENCOUNTER — Other Ambulatory Visit (HOSPITAL_COMMUNITY)
Admission: RE | Admit: 2024-04-25 | Discharge: 2024-04-25 | Disposition: A | Discharge status: Home / Self Care | Source: Ambulatory Visit | Attending: Obstetrics & Gynecology | Admitting: Obstetrics & Gynecology

## 2024-04-25 ENCOUNTER — Ambulatory Visit (INDEPENDENT_AMBULATORY_CARE_PROVIDER_SITE_OTHER): Admitting: Obstetrics & Gynecology

## 2024-04-25 ENCOUNTER — Encounter: Payer: Self-pay | Admitting: Obstetrics & Gynecology

## 2024-04-25 VITALS — BP 158/88 | HR 84 | Ht 67.0 in | Wt 274.8 lb

## 2024-04-25 DIAGNOSIS — L988 Other specified disorders of the skin and subcutaneous tissue: Secondary | ICD-10-CM | POA: Insufficient documentation

## 2024-04-25 DIAGNOSIS — N926 Irregular menstruation, unspecified: Secondary | ICD-10-CM | POA: Insufficient documentation

## 2024-04-25 DIAGNOSIS — Z9884 Bariatric surgery status: Secondary | ICD-10-CM | POA: Diagnosis not present

## 2024-04-25 DIAGNOSIS — Z3202 Encounter for pregnancy test, result negative: Secondary | ICD-10-CM

## 2024-04-25 DIAGNOSIS — N898 Other specified noninflammatory disorders of vagina: Secondary | ICD-10-CM | POA: Insufficient documentation

## 2024-04-25 DIAGNOSIS — Z113 Encounter for screening for infections with a predominantly sexual mode of transmission: Secondary | ICD-10-CM

## 2024-04-25 LAB — POCT URINE PREGNANCY: Preg Test, Ur: NEGATIVE

## 2024-04-25 NOTE — Progress Notes (Signed)
 GYN VISIT Patient name: Jennifer [name] MRN [id number]  Date of birth: 17-Nov-1975 Chief Complaint:   Follow-up (Discuss periods/)  History of Present Illness:   Jennifer CHESLEY [name] is a 48 y.o. G64P1001 female being seen today for the following concerns:  - STI screening; patient has been sexually active with a new partner and desires testing.  She denies vaginal discharge itching or irritation.    -AUB/pregnancy concern: Last month she had 2 periods, which is not typical for her.  Typically they occur around the same time each month and last for about 10 days may start moderate to heavy and then decrease in time.  Overall she has been doing well with Micronor ; however, when she had this change in her menses she was concerned.  Of note she was worried about a pregnancy since she has not yet had a period for June  Other changes include bariatric surgery, she has lost approximately 40 pounds  In review, pt is known to me due to perianal fistula- still has not had repair completed  Patient's last menstrual period was 03/26/2024 (approximate).    Review of Systems:   Pertinent items are noted in HPI Denies fever/chills, dizziness, headaches, visual disturbances, fatigue, shortness of breath, chest pain, abdominal pain, vomiting. Pertinent History Reviewed:   Past Surgical History:  Procedure Laterality Date   APPENDECTOMY     BREAST BIOPSY Left 01/24/2023   BENIGN BREAST TISSUE WITH FAT NECROSIS, ADENOSIS AND CALCIFICATIONS//MM LT BREAST BX W LOC DEV 1ST LESION IMAGE BX SPEC STEREO GUIDE 01/24/2023 [facility]   COLONOSCOPY N/A 03/13/2014   Dr. Riley Cheadle- grade 3 hemorrhoids, colonic diverticulosis bx= benign lymphoid polyp   COLONOSCOPY WITH PROPOFOL  N/A 01/26/2021   Procedure: COLONOSCOPY WITH PROPOFOL ;  Surgeon: Suzette Espy, MD;  [facility];  Service: Endoscopy;  Laterality: N/A;  AM   GASTRIC ROUX-EN-Y N/A 09/05/2023   Procedure: LAPAROSCOPIC ROUX-EN-Y GASTRIC BYPASS  WITH UPPER ENDOSCOPY;  Surgeon: Aldean Hummingbird, MD;  Location: [facility];  Service: General;  Laterality: N/A;   KNEE ARTHROSCOPY     right   POLYPECTOMY  01/26/2021   Procedure: POLYPECTOMY;  Surgeon: Suzette Espy, MD;  Location: [facility];  Service: Endoscopy;;   SMALL INTESTINE SURGERY  2024    Past Medical History:  Diagnosis Date   Abnormal vaginal Pap smear    Acid reflux    Allergy    Alopecia    Anxiety    Arthritis    Cough 09/13/2022   Depression    Depression, major, single episode, severe (HCC) 06/18/2019   pHQ 9 score of 18 in 05/2019, not suicidal or homicidal  PHQ 9 score of 13 in 03/2021  Score of 21 in 11/2021, score of 7 in 04/2022, score of 20 in 06/2022   Diverticulosis    Family history of diabetes mellitus    GERD (gastroesophageal reflux disease)    Hemorrhoids    History of recurrent UTIs    Hypertension    Obesity    Pre-diabetes    PTSD (post-traumatic stress disorder)    Sleep apnea    Mild OSA,  No CPAP   Tuberculosis 2005   treated, still tests positive   Reviewed problem list, medications and allergies. Physical Assessment:   Vitals:   04/25/24 1355  BP: (!) 158/88  Pulse: 84  Weight: 274 lb 12.8 oz (124.6 kg)  Height: 5' 7 (1.702 m)  Body mass index is 43.04 kg/m.  Physical Examination:   General appearance: alert, well appearing, and in no distress  Psych: mood appropriate, normal affect  Skin: warm & dry   Cardiovascular: normal heart rate noted  Respiratory: normal respiratory effort, no distress  Abdomen: soft, non-tender   Pelvic: VULVA: normal appearing vulva with no masses, tenderness or lesions, VAGINA: normal appearing vagina with normal color and discharge, no lesions.  Difficulty visualizing cervix due to vaginal wall redundancy- no   Extremities: no edema   Chaperone: Lorean Rodes    Assessment & Plan:  1) AUB, perimenopausal status - Reassured patient of negative pregnancy test - Since this was the  first change in her menses discussed options including continued monitoring.  Based on the timing of her last menses 5/11 would anticipate she is likely to start her next period within the next few days. - Patient was given option to transition from Micronor  to a different pill, for now she wishes to continue with her current medication []  Should she note continued irregular bleeding may transition to Aygestin  5 or 10mg   2) STi screening Screening to be completed today  3) Perianal fistula - Patient has plans to follow-up with general surgery   Orders Placed This Encounter  Procedures   RPR   HIV Antibody (routine testing w rflx)   POCT urine pregnancy    Return in about 6 months (around 10/25/2024) for AUB check up.   Izaia Say, DO Attending Obstetrician & Gynecologist, [facility] for [employer], [facility]

## 2024-04-26 ENCOUNTER — Ambulatory Visit: Payer: Self-pay | Admitting: Obstetrics & Gynecology

## 2024-04-26 LAB — HIV ANTIBODY (ROUTINE TESTING W REFLEX): HIV Screen 4th Generation wRfx: NONREACTIVE

## 2024-04-26 LAB — RPR: RPR Ser Ql: NONREACTIVE

## 2024-04-27 ENCOUNTER — Ambulatory Visit: Admitting: Dietician

## 2024-04-29 ENCOUNTER — Other Ambulatory Visit: Payer: Self-pay | Admitting: Family Medicine

## 2024-05-25 ENCOUNTER — Ambulatory Visit (HOSPITAL_COMMUNITY): Admitting: Clinical

## 2024-05-26 ENCOUNTER — Other Ambulatory Visit: Payer: Self-pay | Admitting: Family Medicine

## 2024-05-31 ENCOUNTER — Ambulatory Visit (INDEPENDENT_AMBULATORY_CARE_PROVIDER_SITE_OTHER): Admitting: Family Medicine

## 2024-05-31 VITALS — BP 127/83 | HR 92 | Resp 16 | Wt 265.1 lb

## 2024-05-31 DIAGNOSIS — Z1159 Encounter for screening for other viral diseases: Secondary | ICD-10-CM | POA: Diagnosis not present

## 2024-05-31 DIAGNOSIS — R21 Rash and other nonspecific skin eruption: Secondary | ICD-10-CM

## 2024-05-31 DIAGNOSIS — Z23 Encounter for immunization: Secondary | ICD-10-CM

## 2024-05-31 DIAGNOSIS — I10 Essential (primary) hypertension: Secondary | ICD-10-CM

## 2024-05-31 DIAGNOSIS — Z9109 Other allergy status, other than to drugs and biological substances: Secondary | ICD-10-CM | POA: Diagnosis not present

## 2024-05-31 DIAGNOSIS — B009 Herpesviral infection, unspecified: Secondary | ICD-10-CM

## 2024-05-31 DIAGNOSIS — F331 Major depressive disorder, recurrent, moderate: Secondary | ICD-10-CM | POA: Diagnosis not present

## 2024-05-31 MED ORDER — CLOTRIMAZOLE-BETAMETHASONE 1-0.05 % EX CREA: TOPICAL_CREAM | CUTANEOUS | 1 refills | Status: DC

## 2024-05-31 MED ORDER — TIRZEPATIDE 5 MG/0.5ML ~~LOC~~ SOAJ: 5.0000 mg | SUBCUTANEOUS | 3 refills | Status: AC

## 2024-05-31 MED ORDER — AMLODIPINE BESYLATE 5 MG PO TABS: 5.0000 mg | ORAL_TABLET | Freq: Every day | ORAL | 3 refills | Status: AC

## 2024-05-31 MED ORDER — PANTOPRAZOLE SODIUM 40 MG PO TBEC: 40.0000 mg | DELAYED_RELEASE_TABLET | Freq: Every day | ORAL | 3 refills | Status: AC

## 2024-05-31 NOTE — Patient Instructions (Addendum)
 F/Un in 5 months,   Hepatitis B #1 vaccine today.  Hepatitis B numbers 2 and 3 vaccines to be scheduled at checkout as nurse visit in 2 months and 6 months respectively.  Antifungal cream is prescribed for use twice daily on the face for 10 days and as needed.  New higher dose of Mounjaro  is prescribed weekly 5 mg for the next 5 months.  Congratulations on excellent weight loss keep it up.  Need blood test for HSV #1 and HSV #2 TODAY,IF  POSITIVE THEN I WILL PRESCRIBE ANTIVIRAL TABLETS  Keep an eye on the area of concern on upper buttock right buttock, at the time of this visit.  Clinically there is no reason for concern.  I will refer you to Dr. Barbra and see if you are able to reestablish contact with him as your psychiatristper your request  is important that you exercise regularly at least 30 minutes 5 times a week. If you develop chest pain, have severe difficulty breathing, or feel very tired, stop exercising immediately and seek medical attention   Thanks for choosing [facility], we consider it a privelige to serve you.

## 2024-06-01 ENCOUNTER — Ambulatory Visit: Payer: Self-pay | Admitting: Family Medicine

## 2024-06-01 LAB — HSV 1 AND 2 AB, IGG
HSV 1 Glycoprotein G Ab, IgG: NONREACTIVE
HSV 2 IgG, Type Spec: REACTIVE — AB

## 2024-06-01 MED ORDER — ACYCLOVIR 400 MG PO TABS: 400.0000 mg | ORAL_TABLET | Freq: Three times a day (TID) | ORAL | 0 refills | Status: DC

## 2024-06-09 ENCOUNTER — Other Ambulatory Visit: Payer: Self-pay | Admitting: Family Medicine

## 2024-06-09 DIAGNOSIS — U071 COVID-19: Secondary | ICD-10-CM

## 2024-06-17 ENCOUNTER — Encounter: Payer: Self-pay | Admitting: Family Medicine

## 2024-06-17 DIAGNOSIS — B009 Herpesviral infection, unspecified: Secondary | ICD-10-CM | POA: Insufficient documentation

## 2024-06-17 DIAGNOSIS — Z23 Encounter for immunization: Secondary | ICD-10-CM | POA: Insufficient documentation

## 2024-06-17 DIAGNOSIS — R21 Rash and other nonspecific skin eruption: Secondary | ICD-10-CM | POA: Insufficient documentation

## 2024-06-17 NOTE — Assessment & Plan Note (Signed)
 Controlled, no change in medication

## 2024-06-17 NOTE — Assessment & Plan Note (Signed)
 Currently symptomatic, anti viral is prescribed

## 2024-06-17 NOTE — Assessment & Plan Note (Signed)
 Controlled, no change in medication DASH diet and commitment to daily physical activity for a minimum of 30 minutes discussed and encouraged, as a part of hypertension management. The importance of attaining a healthy weight is also discussed.     05/31/2024   10:31 AM 04/25/2024    1:55 PM 04/05/2024   10:56 AM 04/05/2024    9:59 AM 12/22/2023   11:31 AM 10/18/2023    3:39 PM 09/20/2023    5:30 PM  BP/Weight  Systolic BP 127 158 130 138  128   Diastolic BP 83 88 90 85  82   Wt. (Lbs) 265.12 274.8  271 285.3 295 301.1  BMI 41.52 kg/m2 43.04 kg/m2  43.74 kg/m2 44.68 kg/m2 46.2 kg/m2 47.87 kg/m2

## 2024-06-17 NOTE — Progress Notes (Signed)
 Jennifer [name]     MRN: [id number]      DOB: [birth year]  Chief Complaint  Patient presents with   Hypertension    8 week follow up     HPI Jennifer [name] is here for follow up and re-evaluation of chronic medical conditions, medication management and review of any available recent lab and radiology data.  Preventive health is updated, specifically  Cancer screening and Immunization.   Questions or concerns regarding consultations or procedures which the PT has had in the interim are  addressed. The PT denies any adverse reactions to current medications since the last visit.  C/o perioral stinging and burning with discoloration x weeks Requests referral to psych whoi has treated her in the past No complications with bariatric surgery and losing weight very well  ROS Denies recent fever or chills. Denies sinus pressure, nasal congestion, ear pain or sore throat. Denies chest congestion, productive cough or wheezing. Denies chest pains, palpitations and leg swelling Denies abdominal pain, nausea, vomiting,diarrhea or constipation.   Denies dysuria, frequency, hesitancy or incontinence. Denies joint pain, swelling and limitation in mobility. Denies headaches, seizures, numbness, or tingling.  PE  BP 127/83   Pulse 92   Resp 16   Wt 265 lb 1.9 oz (120.3 kg)   LMP 05/01/2024 (Approximate)   SpO2 94%   BMI 41.52 kg/m   Patient alert and oriented and in no cardiopulmonary distress.  HEENT: No facial asymmetry, EOMI,     Neck supple .  Chest: Clear to auscultation bilaterally.  CVS: S1, S2 no murmurs, no S3.Regular rate.  ABD: Soft non tender.   Ext: No edema  MS: Adequate ROM spine, shoulders, hips and knees.  Skin: Intact, hypopigmented perioral macular lesions  Psych: Good eye contact, flat  affect. Memory intact not anxious mildly  depressed appearing.  CNS: CN 2-12 intact, power,  normal throughout.no focal deficits noted.   Assessment & Plan  Essential  hypertension Controlled, no change in medication DASH diet and commitment to daily physical activity for a minimum of 30 minutes discussed and encouraged, as a part of hypertension management. The importance of attaining a healthy weight is also discussed.     05/31/2024   10:31 AM 04/25/2024    1:55 PM 04/05/2024   10:56 AM 04/05/2024    9:59 AM 12/22/2023   11:31 AM 10/18/2023    3:39 PM 09/20/2023    5:30 PM  BP/Weight  Systolic BP 127 158 130 138  128   Diastolic BP 83 88 90 85  82   Wt. (Lbs) 265.12 274.8  271 285.3 295 301.1  BMI 41.52 kg/m2 43.04 kg/m2  43.74 kg/m2 44.68 kg/m2 46.2 kg/m2 47.87 kg/m2       Morbid obesity (HCC) Excellent weiht loss following surgery Inc mounjaro  dose to 5 mg weekly  Patient re-educated about  the importance of commitment to a  minimum of 150 minutes of exercise per week as able.  The importance of healthy food choices with portion control discussed, as well as eating regularly and within a 12 hour window most days. The need to choose clean , green food 50 to 75% of the time is discussed, as well as to make water  the primary drink and set a goal of 64 ounces water  daily.       05/31/2024   10:31 AM 04/25/2024    1:55 PM 04/05/2024    9:59 AM  Weight /BMI  Weight 265 lb 1.9 oz 274 lb  12.8 oz 271 lb  Height  5' 7 (1.702 m) 5' 6 (1.676 m)  BMI 41.52 kg/m2 43.04 kg/m2 43.74 kg/m2      Moderate episode of recurrent major depressive disorder (HCC) Uncontrolled , refer psych  PCR positive for herpes simplex virus type 1 (HSV-1) DNA Currently symptomatic, anti viral is prescribed  Rash of mouth present on examination Antifungal prescribed for sparing use  Environmental allergies Controlled, no change in medication   Immunization due After obtaining informed consent, the  hep BN #1 vaccine is  administered , with no adverse effect noted at the time of administration.

## 2024-06-17 NOTE — Assessment & Plan Note (Signed)
 Antifungal prescribed for sparing use

## 2024-06-17 NOTE — Assessment & Plan Note (Signed)
 After obtaining informed consent, the  hep BN #1 vaccine is  administered , with no adverse effect noted at the time of administration.

## 2024-06-17 NOTE — Assessment & Plan Note (Signed)
 Excellent weiht loss following surgery Inc mounjaro  dose to 5 mg weekly  Patient re-educated about  the importance of commitment to a  minimum of 150 minutes of exercise per week as able.  The importance of healthy food choices with portion control discussed, as well as eating regularly and within a 12 hour window most days. The need to choose clean , green food 50 to 75% of the time is discussed, as well as to make water  the primary drink and set a goal of 64 ounces water  daily.       05/31/2024   10:31 AM 04/25/2024    1:55 PM 04/05/2024    9:59 AM  Weight /BMI  Weight 265 lb 1.9 oz 274 lb 12.8 oz 271 lb  Height  5' 7 (1.702 m) 5' 6 (1.676 m)  BMI 41.52 kg/m2 43.04 kg/m2 43.74 kg/m2

## 2024-06-17 NOTE — Assessment & Plan Note (Signed)
 Uncontrolled , refer psych

## 2024-07-11 DIAGNOSIS — Z9884 Bariatric surgery status: Secondary | ICD-10-CM | POA: Diagnosis not present

## 2024-07-11 DIAGNOSIS — M170 Bilateral primary osteoarthritis of knee: Secondary | ICD-10-CM | POA: Diagnosis not present

## 2024-07-11 DIAGNOSIS — G8929 Other chronic pain: Secondary | ICD-10-CM | POA: Diagnosis not present

## 2024-07-11 DIAGNOSIS — I10 Essential (primary) hypertension: Secondary | ICD-10-CM | POA: Diagnosis not present

## 2024-07-11 DIAGNOSIS — E1169 Type 2 diabetes mellitus with other specified complication: Secondary | ICD-10-CM | POA: Diagnosis not present

## 2024-07-11 DIAGNOSIS — M545 Low back pain, unspecified: Secondary | ICD-10-CM | POA: Diagnosis not present

## 2024-08-01 ENCOUNTER — Ambulatory Visit (INDEPENDENT_AMBULATORY_CARE_PROVIDER_SITE_OTHER)

## 2024-08-01 DIAGNOSIS — Z23 Encounter for immunization: Secondary | ICD-10-CM

## 2024-08-01 NOTE — Progress Notes (Signed)
 Patient is in office today for a nurse visit for Immunization. Patient Injection was given in the  Left deltoid. Patient tolerated injection well.

## 2024-09-12 ENCOUNTER — Ambulatory Visit (INDEPENDENT_AMBULATORY_CARE_PROVIDER_SITE_OTHER): Admitting: Physician Assistant

## 2024-09-12 ENCOUNTER — Other Ambulatory Visit

## 2024-09-12 ENCOUNTER — Telehealth: Payer: Self-pay

## 2024-09-12 ENCOUNTER — Other Ambulatory Visit (INDEPENDENT_AMBULATORY_CARE_PROVIDER_SITE_OTHER)

## 2024-09-12 VITALS — Ht 66.0 in | Wt 249.8 lb

## 2024-09-12 DIAGNOSIS — M1712 Unilateral primary osteoarthritis, left knee: Secondary | ICD-10-CM | POA: Diagnosis not present

## 2024-09-12 DIAGNOSIS — M1711 Unilateral primary osteoarthritis, right knee: Secondary | ICD-10-CM

## 2024-09-12 DIAGNOSIS — M170 Bilateral primary osteoarthritis of knee: Secondary | ICD-10-CM | POA: Diagnosis not present

## 2024-09-12 LAB — POCT GLYCOSYLATED HEMOGLOBIN (HGB A1C): Hemoglobin A1C: 4.9 % (ref 4.0–5.6)

## 2024-09-12 NOTE — Progress Notes (Signed)
 Office Visit Note   Patient: Jennifer [name]           Date of Birth: September 12, 1976           MRN: [id number] Visit Date: 09/12/2024              Requested by: Antonetta Rollene BRAVO, MD [address] [place],  [place] [address] PCP: Antonetta Rollene BRAVO, MD   Assessment & Plan: Visit Diagnoses:  1. Bilateral primary osteoarthritis of knee     Plan: Impression is advanced osteoarthritis bilateral knees.  We have discussed various treatment options to include repeat cortisone injection versus viscosupplementation injection versus total knee arthroplasty likely with the left 1 first.  She is currently not interested in cortisone injections at this time as they have not provided much relief in the past.  She would like to proceed with total knee arthroplasty.  Will obtained a new hemoglobin A1c today which was 4.9.  She will continue to make all efforts at weight loss as her current BMI is a 40.3.  We will also go for clearance for her PCP.  She will follow-up with Dr. Jerri to discuss total knee replacement surgery.  Follow-Up Instructions: Return for f/u with Dr. Jerri to discuss TKA.   Orders:  Orders Placed This Encounter  Procedures   XR KNEE 3 VIEW LEFT   XR KNEE 3 VIEW RIGHT   POCT HgB A1C   No orders of the defined types were placed in this encounter.     Procedures: No procedures performed   Clinical Data: No additional findings.   Subjective: Chief Complaint  Patient presents with   Right Knee - Pain   Left Knee - Pain    HPI patient is a very pleasant 48 year old female who comes in today with bilateral knee pain both equally as bad.  Pain is throughout the entire aspect of both knees and is constant.  Symptoms are worse with activity as well as at night.  She has been taking gabapentin  and occasional Tylenol  without significant relief.  She has previously undergone cortisone as well as viscosupplementation injections with only temporary relief.  She has been  working at losing weight in order to proceed with total knee replacement surgery.  Of note, she has a type II diabetic.  She tells me she no longer sees her cardiologist and is only being followed by her primary care provider.  Review of Systems as detailed in HPI.  All others reviewed and are negative.   Objective: Vital Signs: Ht 5' 6 (1.676 m)   Wt 249 lb 12.8 oz (113.3 kg)   BMI 40.32 kg/m   Physical Exam well-developed well-nourished female in no acute distress.  Alert and oriented x 3.  Ortho Exam right knee exam: Valgus deformity.  Range of motion 5 to 95 degrees.  Mild tenderness to medial and lateral joint line.  Left knee exam: Varus deformity.  Range of motion 5 to 95 degrees.  Medial and lateral joint line tenderness.  She is neurovascular intact distally.  Specialty Comments:  No specialty comments available.  Imaging: XR KNEE 3 VIEW RIGHT Result Date: 09/12/2024 Advance tricompartmental degenerative changes.  Valgus deformity.  XR KNEE 3 VIEW LEFT Result Date: 09/12/2024 Advanced tricompartmental degenerative changes with medial translation of the femur on the tibia.  Varus deformity.    PMFS History: Patient Active Problem List   Diagnosis Date Noted   PCR positive for herpes simplex virus type 1 (  HSV-1) DNA 06/17/2024   Immunization due 06/17/2024   Rash of mouth present on examination 06/17/2024   Irregular menses 04/05/2024   Encounter for Medicare annual wellness exam 04/05/2024   Routine cervical smear 11/10/2023   Controlled type 2 diabetes mellitus with complication, without long-term current use of insulin  (HCC) 11/10/2023   Screening for STD (sexually transmitted disease) 11/10/2023   S/P gastric bypass 09/05/2023   Pre-op examination 07/29/2023   Dysuria 07/29/2023   COVID-19 01/04/2023   Disability examination 11/16/2022   Mass of arm, right 10/22/2022   Vaginal lesion 10/22/2022   Moderate episode of recurrent major depressive disorder (HCC)  08/11/2022   PTSD (post-traumatic stress disorder) 08/11/2022   Binge eating disorder in remission 08/11/2022   Irritable bowel syndrome with both constipation and diarrhea 07/21/2022   OSA (obstructive sleep apnea) 07/05/2022   Trigger finger, right middle finger 07/02/2022   Colovesical fistula 12/08/2021    Class: Question of   Osteoarthritis of both knees 12/03/2021   Generalized arthritis 09/09/2021   Snoring 07/06/2021   Financial difficulties 05/09/2021   Reduced vision 04/03/2021   Hematochezia 09/09/2020   Right wrist pain 07/21/2020   Unsteady gait 07/14/2020   Poor urinary stream 05/27/2020   Environmental allergies 06/18/2019   Candidiasis of skin 01/22/2019   Back pain 11/28/2017   Essential hypertension 03/22/2016   Diverticulosis 11/23/2015   Hemorrhoids 03/26/2015   Insomnia due to other mental disorder 07/10/2014   Generalized anxiety disorder with panic attacks 07/10/2014   FHx: cancer of digestive organ 02/16/2014   Piles (hemorrhoids) 02/14/2014   Allergic rhinitis 02/12/2013   Prediabetes 03/26/2012   Dyspepsia 03/22/2012   Pulmonary tuberculosis 11/02/2010   Alopecia (capitis) totalis 11/02/2010   Chronic fatigue 07/23/2009   Morbid obesity (HCC) 11/24/2007   Past Medical History:  Diagnosis Date   Abnormal vaginal Pap smear    Acid reflux    Allergy    Alopecia    Anxiety    Arthritis    Cough 09/13/2022   Depression    Depression, major, single episode, severe (HCC) 06/18/2019   pHQ 9 score of 18 in 05/2019, not suicidal or homicidal  PHQ 9 score of 13 in 03/2021  Score of 21 in 11/2021, score of 7 in 04/2022, score of 20 in 06/2022   Diverticulosis    Family history of diabetes mellitus    GERD (gastroesophageal reflux disease)    Hemorrhoids    History of recurrent UTIs    Hypertension    Obesity    Pre-diabetes    PTSD (post-traumatic stress disorder)    Sleep apnea    Mild OSA,  No CPAP   Tuberculosis 2005   treated, still tests  positive    Family History  Adopted: Yes  Problem Relation Age of Onset   Hypertension Mother    Diabetes Mother    Other Mother        cholangiocarcinoma, age 54, deceased   Cancer Mother    Obesity Mother    Varicose Veins Mother    Breast cancer Maternal Aunt    Cancer Maternal Aunt    Diabetes Maternal Aunt    Stroke Maternal Uncle    Colon cancer Neg Hx    Tuberous sclerosis Neg Hx    Alpha-1 antitrypsin deficiency Neg Hx     Past Surgical History:  Procedure Laterality Date   APPENDECTOMY     BREAST BIOPSY Left 01/24/2023   BENIGN BREAST TISSUE WITH FAT NECROSIS, ADENOSIS AND  CALCIFICATIONS//MM LT BREAST BX W LOC DEV 1ST LESION IMAGE BX SPEC STEREO GUIDE 01/24/2023 [facility]   COLONOSCOPY N/A 03/13/2014   Dr. Shaaron- grade 3 hemorrhoids, colonic diverticulosis bx= benign lymphoid polyp   COLONOSCOPY WITH PROPOFOL  N/A 01/26/2021   Procedure: COLONOSCOPY WITH PROPOFOL ;  Surgeon: Shaaron Lamar HERO, MD;  [facility];  Service: Endoscopy;  Laterality: N/A;  AM   GASTRIC ROUX-EN-Y N/A 09/05/2023   Procedure: LAPAROSCOPIC ROUX-EN-Y GASTRIC BYPASS WITH UPPER ENDOSCOPY;  Surgeon: Tanda Locus, MD;  Location: [facility];  Service: General;  Laterality: N/A;   KNEE ARTHROSCOPY     right   POLYPECTOMY  01/26/2021   Procedure: POLYPECTOMY;  Surgeon: Shaaron Lamar HERO, MD;  Location: [facility];  Service: Endoscopy;;   SMALL INTESTINE SURGERY  2024   Social History   Occupational History   Occupation: UNEMPLOYWED SINCE 10/2010  Tobacco Use   Smoking status: Never   Smokeless tobacco: Never  Vaping Use   Vaping status: Never Used  Substance and Sexual Activity   Alcohol use: No   Drug use: Never   Sexual activity: Yes    Birth control/protection: Pill

## 2024-09-12 NOTE — Telephone Encounter (Signed)
 PCP clearance form given to patient. She is aware we need this back in order to schedule surgery TKA

## 2024-09-13 NOTE — Progress Notes (Signed)
 Jennifer [name]                                          MRN: [id number]   09/13/2024   The [facility] Quality Team Specialist reviewed this patient medical record for the purposes of chart review for care gap closure. The following were reviewed: abstraction for care gap closure-glycemic status assessment.    [facility] Quality Team

## 2024-09-17 ENCOUNTER — Encounter: Payer: Self-pay | Admitting: Radiology

## 2024-09-27 ENCOUNTER — Telehealth: Payer: Self-pay | Admitting: Pharmacist

## 2024-09-27 NOTE — Progress Notes (Signed)
 Pharmacy Quality Measure Review  This patient is appearing on the insurance-providing list for being at risk of failing the adherence measure for Statin Use in Persons with Diabetes (SUPD) medications this calendar year.   Patient with a diagnosis of controlled type 2 diabetes, most recent A1c post bariatric surgery of 4.9.  PREVENT Risk Score: 10 year risk of CVD: 5.6% - 10 year risk of ASCVD: 2.8% - 10 year risk of HF: 5.2%  Could consider moderate intensity statin therapy, though would also likely be appropriate to check coronary artery calcium score to determine if plaque build up exists.   Patient sees PCP in December.   Catie [facility], PharmD, [facility] Clinical Pharmacist [phone]

## 2024-10-03 ENCOUNTER — Ambulatory Visit: Admitting: Orthopaedic Surgery

## 2024-10-03 ENCOUNTER — Telehealth: Payer: Self-pay

## 2024-10-03 VITALS — Ht 65.5 in | Wt 243.8 lb

## 2024-10-03 DIAGNOSIS — M1711 Unilateral primary osteoarthritis, right knee: Secondary | ICD-10-CM | POA: Diagnosis not present

## 2024-10-03 NOTE — Progress Notes (Signed)
 Office Visit Note   Patient: Jennifer [name]           Date of Birth: [birth year]           MRN: [id number] Visit Date: 10/03/2024              Requested by: Antonetta Rollene BRAVO, MD [address] [place],  [place] [address] PCP: Antonetta Rollene BRAVO, MD   Assessment & Plan: Visit Diagnoses:  1. Primary osteoarthritis of right knee     Plan: History of Present Illness Jennifer [name] is a 48 year old female with obesity and diabetes who presents for evaluation of bilateral knee pain and consideration of knee replacement surgery.  She experiences significant bilateral knee pain, with a tendency to put most of her weight on her left knee, which she considers her 'strong knee'. The pain severely limits her activities. Approximately one year ago, she underwent gastric bypass surgery, resulting in a weight loss from 317 pounds to 243 pounds. This weight loss has improved her diabetes, with an HbA1c of 4.9. She is not currently taking any medications for her knee pain.  Physical Exam MSK: Right knee shows valgus deformity with crepitus and pain with ROM.   Results LABS Hemoglobin A1c: 4.9%  Assessment and Plan Bilateral primary osteoarthritis of the knees Severe bilateral osteoarthritis with significant pain and functional impairment. Right knee more symptomatic Windswept deformity noted. Discussed knee replacement surgery, realistic expectations, and post-operative satisfaction rates. Emphasized importance of physical therapy. Diabetes improved with weight loss. - Proceed with right knee replacement surgery due to greater pain. - Obtain medical clearance from primary care physician. - Complete surgical risk assessment questionnaire. - Schedule surgery after clearance. - Plan for two months of post-operative physical therapy. - Consider left knee replacement surgery approximately six months to a year after right knee surgery. - detailed surgical discussion including  r/b/a  Follow-Up Instructions: No follow-ups on file.   Orders:  No orders of the defined types were placed in this encounter.  No orders of the defined types were placed in this encounter.     Procedures: No procedures performed   Clinical Data: No additional findings.   Subjective: Chief Complaint  Patient presents with   Right Knee - Pain   Left Knee - Pain    HPI  Review of Systems  Constitutional: Negative.   HENT: Negative.    Eyes: Negative.   Respiratory: Negative.    Cardiovascular: Negative.   Endocrine: Negative.   Musculoskeletal: Negative.   Neurological: Negative.   Hematological: Negative.   Psychiatric/Behavioral: Negative.    All other systems reviewed and are negative.    Objective: Vital Signs: Ht 5' 5.5 (1.664 m)   Wt 243 lb 12.8 oz (110.6 kg)   BMI 39.95 kg/m   Physical Exam Vitals and nursing note reviewed.  Constitutional:      Appearance: She is well-developed.  HENT:     Head: Atraumatic.     Nose: Nose normal.  Eyes:     Extraocular Movements: Extraocular movements intact.  Cardiovascular:     Pulses: Normal pulses.  Pulmonary:     Effort: Pulmonary effort is normal.  Abdominal:     Palpations: Abdomen is soft.  Musculoskeletal:     Cervical back: Neck supple.  Skin:    General: Skin is warm.     Capillary Refill: Capillary refill takes less than 2 seconds.  Neurological:     Mental Status: She is  alert. Mental status is at baseline.  Psychiatric:        Behavior: Behavior normal.        Thought Content: Thought content normal.        Judgment: Judgment normal.     Ortho Exam  Specialty Comments:  No specialty comments available.  Imaging: No results found.   PMFS History: Patient Active Problem List   Diagnosis Date Noted   Primary osteoarthritis of right knee 10/03/2024   PCR positive for herpes simplex virus type 1 (HSV-1) DNA 06/17/2024   Immunization due 06/17/2024   Rash of mouth present on  examination 06/17/2024   Irregular menses 04/05/2024   Encounter for Medicare annual wellness exam 04/05/2024   Routine cervical smear 11/10/2023   Controlled type 2 diabetes mellitus with complication, without long-term current use of insulin  (HCC) 11/10/2023   Screening for STD (sexually transmitted disease) 11/10/2023   S/P gastric bypass 09/05/2023   Pre-op examination 07/29/2023   Dysuria 07/29/2023   COVID-19 01/04/2023   Disability examination 11/16/2022   Mass of arm, right 10/22/2022   Vaginal lesion 10/22/2022   Moderate episode of recurrent major depressive disorder (HCC) 08/11/2022   PTSD (post-traumatic stress disorder) 08/11/2022   Binge eating disorder in remission 08/11/2022   Irritable bowel syndrome with both constipation and diarrhea 07/21/2022   OSA (obstructive sleep apnea) 07/05/2022   Trigger finger, right middle finger 07/02/2022   Colovesical fistula 12/08/2021    Class: Question of   Primary osteoarthritis of left knee 12/03/2021   Generalized arthritis 09/09/2021   Snoring 07/06/2021   Financial difficulties 05/09/2021   Reduced vision 04/03/2021   Hematochezia 09/09/2020   Right wrist pain 07/21/2020   Unsteady gait 07/14/2020   Poor urinary stream 05/27/2020   Environmental allergies 06/18/2019   Candidiasis of skin 01/22/2019   Back pain 11/28/2017   Essential hypertension 03/22/2016   Diverticulosis 11/23/2015   Hemorrhoids 03/26/2015   Insomnia due to other mental disorder 07/10/2014   Generalized anxiety disorder with panic attacks 07/10/2014   FHx: cancer of digestive organ 02/16/2014   Piles (hemorrhoids) 02/14/2014   Allergic rhinitis 02/12/2013   Prediabetes 03/26/2012   Dyspepsia 03/22/2012   Pulmonary tuberculosis 11/02/2010   Alopecia (capitis) totalis 11/02/2010   Chronic fatigue 07/23/2009   Morbid obesity (HCC) 11/24/2007   Past Medical History:  Diagnosis Date   Abnormal vaginal Pap smear    Acid reflux    Allergy     Alopecia    Anxiety    Arthritis    Cough 09/13/2022   Depression    Depression, major, single episode, severe (HCC) 06/18/2019   pHQ 9 score of 18 in 05/2019, not suicidal or homicidal  PHQ 9 score of 13 in 03/2021  Score of 21 in 11/2021, score of 7 in 04/2022, score of 20 in 06/2022   Diverticulosis    Family history of diabetes mellitus    GERD (gastroesophageal reflux disease)    Hemorrhoids    History of recurrent UTIs    Hypertension    Obesity    Pre-diabetes    PTSD (post-traumatic stress disorder)    Sleep apnea    Mild OSA,  No CPAP   Tuberculosis 2005   treated, still tests positive    Family History  Adopted: Yes  Problem Relation Age of Onset   Hypertension Mother    Diabetes Mother    Other Mother        cholangiocarcinoma, age 57, deceased   Cancer  Mother    Obesity Mother    Varicose Veins Mother    Breast cancer Maternal Aunt    Cancer Maternal Aunt    Diabetes Maternal Aunt    Stroke Maternal Uncle    Colon cancer Neg Hx    Tuberous sclerosis Neg Hx    Alpha-1 antitrypsin deficiency Neg Hx     Past Surgical History:  Procedure Laterality Date   APPENDECTOMY     BREAST BIOPSY Left 01/24/2023   BENIGN BREAST TISSUE WITH FAT NECROSIS, ADENOSIS AND CALCIFICATIONS//MM LT BREAST BX W LOC DEV 1ST LESION IMAGE BX SPEC STEREO GUIDE 01/24/2023 [facility]   COLONOSCOPY N/A 03/13/2014   Dr. Shaaron- grade 3 hemorrhoids, colonic diverticulosis bx= benign lymphoid polyp   COLONOSCOPY WITH PROPOFOL  N/A 01/26/2021   Procedure: COLONOSCOPY WITH PROPOFOL ;  Surgeon: Shaaron Lamar HERO, MD;  [facility]: [facility];  Service: Endoscopy;  Laterality: N/A;  AM   GASTRIC ROUX-EN-Y N/A 09/05/2023   Procedure: LAPAROSCOPIC ROUX-EN-Y GASTRIC BYPASS WITH UPPER ENDOSCOPY;  Surgeon: Tanda Locus, MD;  Location: [facility];  Service: General;  Laterality: N/A;   KNEE ARTHROSCOPY     right   POLYPECTOMY  01/26/2021   Procedure: POLYPECTOMY;  Surgeon: Shaaron Lamar HERO, MD;   [facility];  Service: Endoscopy;;   SMALL INTESTINE SURGERY  2024   Social History   Occupational History   Occupation: UNEMPLOYWED SINCE 10/2010  Tobacco Use   Smoking status: Never   Smokeless tobacco: Never  Vaping Use   Vaping status: Never Used  Substance and Sexual Activity   Alcohol use: No   Drug use: Never   Sexual activity: Yes    Birth control/protection: Pill

## 2024-10-03 NOTE — Telephone Encounter (Signed)
 Patient was given clearance form to take to PCP for future R TKA.

## 2024-10-04 ENCOUNTER — Ambulatory Visit (HOSPITAL_COMMUNITY)
Admission: RE | Admit: 2024-10-04 | Discharge: 2024-10-04 | Disposition: A | Discharge status: Home / Self Care | Source: Ambulatory Visit | Attending: Family Medicine | Admitting: Family Medicine

## 2024-10-04 ENCOUNTER — Ambulatory Visit (INDEPENDENT_AMBULATORY_CARE_PROVIDER_SITE_OTHER): Admitting: Family Medicine

## 2024-10-04 ENCOUNTER — Encounter: Payer: Self-pay | Admitting: Family Medicine

## 2024-10-04 VITALS — BP 119/74 | HR 92 | Resp 16 | Ht 65.0 in | Wt 242.0 lb

## 2024-10-04 DIAGNOSIS — F411 Generalized anxiety disorder: Secondary | ICD-10-CM

## 2024-10-04 DIAGNOSIS — F331 Major depressive disorder, recurrent, moderate: Secondary | ICD-10-CM

## 2024-10-04 DIAGNOSIS — R222 Localized swelling, mass and lump, trunk: Secondary | ICD-10-CM

## 2024-10-04 DIAGNOSIS — I10 Essential (primary) hypertension: Secondary | ICD-10-CM | POA: Insufficient documentation

## 2024-10-04 DIAGNOSIS — Z01818 Encounter for other preprocedural examination: Secondary | ICD-10-CM | POA: Insufficient documentation

## 2024-10-04 DIAGNOSIS — F410 Panic disorder [episodic paroxysmal anxiety] without agoraphobia: Secondary | ICD-10-CM

## 2024-10-04 DIAGNOSIS — F50814 Binge eating disorder, in remission: Secondary | ICD-10-CM | POA: Diagnosis not present

## 2024-10-04 DIAGNOSIS — Z23 Encounter for immunization: Secondary | ICD-10-CM

## 2024-10-04 DIAGNOSIS — F322 Major depressive disorder, single episode, severe without psychotic features: Secondary | ICD-10-CM

## 2024-10-04 DIAGNOSIS — M1711 Unilateral primary osteoarthritis, right knee: Secondary | ICD-10-CM

## 2024-10-04 DIAGNOSIS — F4310 Post-traumatic stress disorder, unspecified: Secondary | ICD-10-CM

## 2024-10-04 MED ORDER — FLUOXETINE HCL 40 MG PO CAPS: 80.0000 mg | ORAL_CAPSULE | Freq: Every day | ORAL | 1 refills | Status: AC

## 2024-10-04 NOTE — Assessment & Plan Note (Signed)
 Continue current medication  refer to Psych shhe hAS seen in he past, PHQ 9 score of 8 in 09/2024

## 2024-10-04 NOTE — Assessment & Plan Note (Signed)
 Controlled, no change in medication DASH diet and commitment to daily physical activity for a minimum of 30 minutes discussed and encouraged, as a part of hypertension management. The importance of attaining a healthy weight is also discussed.     10/04/2024    9:02 AM 10/03/2024   10:30 AM 09/12/2024   10:07 AM 05/31/2024   10:31 AM 04/25/2024    1:55 PM 04/05/2024   10:56 AM 04/05/2024    9:59 AM  BP/Weight  Systolic BP 119   127 158 130 861  Diastolic BP 74   83 88 90 85  Wt. (Lbs) 242 243.8 249.8 265.12 274.8  271  BMI 40.27 kg/m2 39.95 kg/m2 40.32 kg/m2 41.52 kg/m2 43.04 kg/m2  43.74 kg/m2

## 2024-10-04 NOTE — Assessment & Plan Note (Addendum)
  Obtain CXR EKG no acute abnormality , unchanged as compared to prior  Urine : no infection Blood count and kidney function within normal History and exam as documented Medically cleared

## 2024-10-04 NOTE — Patient Instructions (Addendum)
 Pls cancel December appointment  Reschedule to 4 month for annual exam  CCUA, CBC and bmp today  CXR today  EKG today  Pls schedule mammogram at checkout  Influenza vaccine today  Covid vaccine and Shingrix vaccines are recommended and both are at your pharmacy  You are referred to Dr Barbra, pls keep appt if you get one  You will be referred to gen surgery for evaluation of nodule  Think about what you will eat, plan ahead. Choose  clean, green, fresh or frozen over canned, processed or packaged foods which are more sugary, salty and fatty. 70 to 75% of food eaten should be vegetables and fruit. Three meals at set times with snacks allowed between meals, but they must be fruit or vegetables. Aim to eat over a 12 hour period , example 7 am to 7 pm, and STOP after  your last meal of the day. Drink water ,generally about 64 ounces per day, no other drink is as healthy. Fruit juice is best enjoyed in a healthy way, by EATING the fruit.   Thanks for choosing [facility], we consider it a privelige to serve you.

## 2024-10-04 NOTE — Assessment & Plan Note (Addendum)
 Nickel sized nodule left buttock for over 12 months, pt rep[orts increasing in size , will refer for gen surg eval

## 2024-10-05 ENCOUNTER — Encounter: Payer: Self-pay | Admitting: Family Medicine

## 2024-10-05 ENCOUNTER — Ambulatory Visit: Payer: Self-pay | Admitting: Family Medicine

## 2024-10-05 ENCOUNTER — Other Ambulatory Visit: Payer: Self-pay

## 2024-10-05 DIAGNOSIS — R300 Dysuria: Secondary | ICD-10-CM

## 2024-10-05 DIAGNOSIS — Z23 Encounter for immunization: Secondary | ICD-10-CM | POA: Insufficient documentation

## 2024-10-05 LAB — CBC WITH DIFFERENTIAL/PLATELET
Basophils Absolute: 0.1 x10E3/uL (ref 0.0–0.2)
Basos: 1 %
EOS (ABSOLUTE): 0.1 x10E3/uL (ref 0.0–0.4)
Eos: 2 %
Hematocrit: 42.5 % (ref 34.0–46.6)
Hemoglobin: 14.4 g/dL (ref 11.1–15.9)
Immature Grans (Abs): 0 x10E3/uL (ref 0.0–0.1)
Immature Granulocytes: 0 %
Lymphocytes Absolute: 2.6 x10E3/uL (ref 0.7–3.1)
Lymphs: 43 %
MCH: 32.3 pg (ref 26.6–33.0)
MCHC: 33.9 g/dL (ref 31.5–35.7)
MCV: 95 fL (ref 79–97)
Monocytes Absolute: 0.4 x10E3/uL (ref 0.1–0.9)
Monocytes: 7 %
Neutrophils Absolute: 2.9 x10E3/uL (ref 1.4–7.0)
Neutrophils: 47 %
Platelets: 363 x10E3/uL (ref 150–450)
RBC: 4.46 x10E6/uL (ref 3.77–5.28)
RDW: 13.3 % (ref 11.7–15.4)
WBC: 6 x10E3/uL (ref 3.4–10.8)

## 2024-10-05 LAB — BMP8+EGFR
BUN/Creatinine Ratio: 7 — ABNORMAL LOW (ref 9–23)
BUN: 7 mg/dL (ref 6–24)
CO2: 22 mmol/L (ref 20–29)
Calcium: 9.3 mg/dL (ref 8.7–10.2)
Chloride: 100 mmol/L (ref 96–106)
Creatinine, Ser: 0.94 mg/dL (ref 0.57–1.00)
Glucose: 76 mg/dL (ref 70–99)
Potassium: 3.7 mmol/L (ref 3.5–5.2)
Sodium: 139 mmol/L (ref 134–144)
eGFR: 74 mL/min/1.73 (ref >59)

## 2024-10-05 NOTE — Progress Notes (Signed)
 Jennifer [name]     MRN: [id number]      DOB: [birth year]  Chief Complaint  Patient presents with   Pre-op Exam    Total knee arthroplasty     HPI Jennifer [name] is here for clearance for right knee arthroplasty. Main c/o is right knee pain and instability  ROS Denies recent fever or chills. Denies sinus pressure, nasal congestion, ear pain or sore throat. Denies chest congestion, productive cough or wheezing. Denies chest pains, palpitations and leg swelling Denies abdominal pain, nausea, vomiting,diarrhea or constipation.   Denies dysuria, frequency, hesitancy or incontinence.  Denies headaches, seizures, numbness, or tingling. Denies uncontrolled depression, anxiety or insomnia. Denies skin break down or rash.c/o tender nodule on buttock x 1 year increasing in size    PE  BP 119/74   Pulse 92   Resp 16   Ht 5' 5 (1.651 m)   Wt 242 lb (109.8 kg)   LMP 09/27/2024 (Approximate)   SpO2 100%   BMI 40.27 kg/m   Patient alert and oriented and in no cardiopulmonary distress.  HEENT: No facial asymmetry, EOMI,     Neck supple .  Chest: Clear to auscultation bilaterally.  CVS: S1, S2 no murmurs, no S3.Regular rate.  ABD: Soft non tender.   Ext: No edema  MS: Adequate though reduced  ROM spine, shoulders, hips and reduced markedly in  knees.  Skin: Intact, no ulcerations or rash noted.Nodule right buttock tender  Psych: Good eye contact, normal affect. Memory intact not anxious or depressed appearing.  CNS: CN 2-12 intact, power,  normal throughout.no focal deficits noted.   Assessment & Plan  Pre-op examination Hypertension and preop  Obtain CXR DASH diet and commitment to daily physical activity for a minimum of 30 minutes discussed and encouraged, as a part of hypertension management. The importance of attaining a healthy weight is also discussed.     10/04/2024    9:02 AM 10/03/2024   10:30 AM 09/12/2024   10:07 AM 05/31/2024   10:31 AM 04/25/2024     1:55 PM 04/05/2024   10:56 AM 04/05/2024    9:59 AM  BP/Weight  Systolic BP 119   127 158 130 861  Diastolic BP 74   83 88 90 85  Wt. (Lbs) 242 243.8 249.8 265.12 274.8  271  BMI 40.27 kg/m2 39.95 kg/m2 40.32 kg/m2 41.52 kg/m2 43.04 kg/m2  43.74 kg/m2       Essential hypertension Controlled, no change in medication DASH diet and commitment to daily physical activity for a minimum of 30 minutes discussed and encouraged, as a part of hypertension management. The importance of attaining a healthy weight is also discussed.     10/04/2024    9:02 AM 10/03/2024   10:30 AM 09/12/2024   10:07 AM 05/31/2024   10:31 AM 04/25/2024    1:55 PM 04/05/2024   10:56 AM 04/05/2024    9:59 AM  BP/Weight  Systolic BP 119   127 158 130 861  Diastolic BP 74   83 88 90 85  Wt. (Lbs) 242 243.8 249.8 265.12 274.8  271  BMI 40.27 kg/m2 39.95 kg/m2 40.32 kg/m2 41.52 kg/m2 43.04 kg/m2  43.74 kg/m2       Depression, major, single episode, severe (HCC) Continue current medication  refer to Psych shhe hAS seen in he past, PHQ 9 score of 8 in 09/2024  Nodule of buttock Nickel sized nodule left buttock for over 12 months, pt rep[orts increasing in size ,  will refer for gen surg eval

## 2024-10-05 NOTE — Assessment & Plan Note (Signed)
 Pre op exam as documented and cleared medically fo surgery

## 2024-10-05 NOTE — Assessment & Plan Note (Signed)
 After obtaining informed consent, the vaccine is  administered , with no adverse effect noted at the time of administration.

## 2024-10-06 ENCOUNTER — Other Ambulatory Visit: Payer: Self-pay | Admitting: Family Medicine

## 2024-10-06 LAB — URINALYSIS
Glucose, UA: NEGATIVE
Ketones, UA: NEGATIVE
Nitrite, UA: NEGATIVE
Specific Gravity, UA: 1.03 (ref 1.005–1.030)
Urobilinogen, Ur: 1 mg/dL (ref 0.2–1.0)
pH, UA: 6 (ref 5.0–7.5)

## 2024-10-31 ENCOUNTER — Ambulatory Visit: Admitting: Family Medicine

## 2024-11-19 ENCOUNTER — Encounter: Payer: Self-pay | Admitting: Orthopaedic Surgery

## 2024-11-21 NOTE — Telephone Encounter (Signed)
 Let's get her back in soon so we can document new weight and document left TKA.  Thanks.

## 2024-11-22 ENCOUNTER — Ambulatory Visit: Admitting: General Surgery

## 2024-11-22 ENCOUNTER — Encounter: Payer: Self-pay | Admitting: General Surgery

## 2024-11-22 VITALS — BP 124/86 | HR 80 | Temp 98.0°F | Resp 16 | Ht 65.0 in | Wt 236.0 lb

## 2024-11-22 DIAGNOSIS — R229 Localized swelling, mass and lump, unspecified: Secondary | ICD-10-CM | POA: Diagnosis not present

## 2024-11-23 NOTE — Progress Notes (Signed)
 Jennifer [name]; [id number]; May 02, 1976   HPI Patient is a 49 year old black female who is referred to my care by Dr. Antonetta for evaluation treatment of a subcutaneous nodule on the left buttock.  Patient states has been present for some time and intermittently bothers her when pressure is applied.  She does have a difficult time trying to find it.  No drainage has been noted.  She has not recently had any injections into the buttock.  She has status post gastric bypass and has had successful weight loss since that surgery.  No fever or chills are noted. Past Medical History:  Diagnosis Date   Abnormal vaginal Pap smear    Acid reflux    Allergy    Alopecia    Anxiety    Arthritis    Cough 09/13/2022   Depression    Depression, major, single episode, severe (HCC) 06/18/2019   pHQ 9 score of 18 in 05/2019, not suicidal or homicidal  PHQ 9 score of 13 in 03/2021  Score of 21 in 11/2021, score of 7 in 04/2022, score of 20 in 06/2022   Diverticulosis    Family history of diabetes mellitus    GERD (gastroesophageal reflux disease)    Hemorrhoids    History of recurrent UTIs    Hypertension    Obesity    Pre-diabetes    PTSD (post-traumatic stress disorder)    Sleep apnea    Mild OSA,  No CPAP   Tuberculosis 2005   treated, still tests positive    Past Surgical History:  Procedure Laterality Date   APPENDECTOMY     BREAST BIOPSY Left 01/24/2023   BENIGN BREAST TISSUE WITH FAT NECROSIS, ADENOSIS AND CALCIFICATIONS//MM LT BREAST BX W LOC DEV 1ST LESION IMAGE BX SPEC STEREO GUIDE 01/24/2023 [facility]   COLONOSCOPY N/A 03/13/2014   Dr. Shaaron- grade 3 hemorrhoids, colonic diverticulosis bx= benign lymphoid polyp   COLONOSCOPY WITH PROPOFOL  N/A 01/26/2021   Procedure: COLONOSCOPY WITH PROPOFOL ;  Surgeon: Shaaron Lamar CHRISTELLA, MD;  Location: [facility];  Service: Endoscopy;  Laterality: N/A;  AM   GASTRIC ROUX-EN-Y N/A 09/05/2023   Procedure: LAPAROSCOPIC ROUX-EN-Y GASTRIC BYPASS  WITH UPPER ENDOSCOPY;  Surgeon: Tanda Locus, MD;  Location: [facility];  Service: General;  Laterality: N/A;   KNEE ARTHROSCOPY     right   POLYPECTOMY  01/26/2021   Procedure: POLYPECTOMY;  Surgeon: Shaaron Lamar CHRISTELLA, MD;  Location: [facility];  Service: Endoscopy;;   SMALL INTESTINE SURGERY  2024    Family History  Adopted: Yes  Problem Relation Age of Onset   Hypertension Mother    Diabetes Mother    Other Mother        cholangiocarcinoma, age 34, deceased   Cancer Mother    Obesity Mother    Varicose Veins Mother    Breast cancer Maternal Aunt    Cancer Maternal Aunt    Diabetes Maternal Aunt    Stroke Maternal Uncle    Colon cancer Neg Hx    Tuberous sclerosis Neg Hx    Alpha-1 antitrypsin deficiency Neg Hx     Medications Ordered Prior to Encounter[1]  Allergies[2]  Social History   Substance and Sexual Activity  Alcohol Use No    Tobacco Use History[3]  Review of Systems  Constitutional: Negative.   HENT: Negative.    Eyes:  Positive for blurred vision.  Respiratory: Negative.    Cardiovascular: Negative.   Gastrointestinal: Negative.   Genitourinary:  Positive for urgency.  Musculoskeletal:  Positive for back pain and joint pain.  Skin: Negative.   Neurological:  Positive for headaches.  Endo/Heme/Allergies: Negative.   Psychiatric/Behavioral: Negative.      Objective   Vitals:   11/22/24 1103  BP: 124/86  Pulse: 80  Resp: 16  Temp: 98 F (36.7 C)  SpO2: 98%    Physical Exam Vitals reviewed.  Constitutional:      Appearance: Normal appearance. She is obese. She is not ill-appearing.  HENT:     Head: Normocephalic and atraumatic.  Cardiovascular:     Rate and Rhythm: Normal rate and regular rhythm.     Heart sounds: Normal heart sounds. No murmur heard.    No friction rub. No gallop.  Pulmonary:     Effort: Pulmonary effort is normal. No respiratory distress.     Breath sounds: Normal breath sounds. No stridor. No wheezing, rhonchi or  rales.  Skin:    General: Skin is warm and dry.     Comments: Small less than 1 cm round deep subcutaneous mobile nodule in the left upper buttock.  No overlying skin changes noted.  Neurological:     Mental Status: She is alert and oriented to person, place, and time.     Assessment  Benign subcutaneous nodule most likely adipose tissue necrosis with scarring. Plan  I told the patient that this was probably a benign nodule.  I would excise it if it changes in size or causes her more discomfort.  Patient understands and agrees.  She does not want surgical intervention at this time.  Follow-up here as needed.    [1]  Current Outpatient Medications on File Prior to Visit  Medication Sig Dispense Refill   ACCU-CHEK GUIDE test strip USE TO CHECK SUGAR UP TO 4 TIMES DAILY AS DIRECTED E10.9 E11.9 100 strip 1   amLODipine  (NORVASC ) 5 MG tablet Take 1 tablet (5 mg total) by mouth daily. 90 tablet 3   Calcium-Vitamin D -Vitamin K (CALCIUM SOFT CHEWS PO) Take 1,500 mg by mouth daily.     FLUoxetine  (PROZAC ) 40 MG capsule Take 2 capsules (80 mg total) by mouth daily. 180 capsule 1   fluticasone  (FLONASE ) 50 MCG/ACT nasal spray SPRAY 2 SPRAYS INTO EACH NOSTRIL EVERY DAY (Patient taking differently: Place 2 sprays into both nostrils daily as needed for allergies.) 48 mL 2   gabapentin  (NEURONTIN ) 300 MG capsule TAKE 1 CAPSULE BY MOUTH EVERYDAY AT BEDTIME 30 capsule 3   levocetirizine (XYZAL ) 5 MG tablet TAKE 1 TABLET BY MOUTH EVERY DAY IN THE EVENING 90 tablet 1   norethindrone  (MICRONOR ) 0.35 MG tablet TAKE 1 TABLET BY MOUTH EVERY DAY 84 tablet 3   pantoprazole  (PROTONIX ) 40 MG tablet Take 1 tablet (40 mg total) by mouth daily. 90 tablet 3   tirzepatide  (MOUNJARO ) 5 MG/0.5ML Pen Inject 5 mg into the skin once a week. 2 mL 3   No current facility-administered medications on file prior to visit.  [2] No Known Allergies [3]  Social History Tobacco Use  Smoking Status Never  Smokeless Tobacco Never

## 2024-12-03 ENCOUNTER — Ambulatory Visit: Payer: Self-pay

## 2024-12-05 ENCOUNTER — Ambulatory Visit: Admitting: Orthopaedic Surgery

## 2024-12-05 VITALS — Ht 65.75 in | Wt 233.0 lb

## 2024-12-05 DIAGNOSIS — M1712 Unilateral primary osteoarthritis, left knee: Secondary | ICD-10-CM | POA: Diagnosis not present

## 2024-12-05 NOTE — Progress Notes (Signed)
 "  Office Visit Note   Patient: Jennifer [name]           Date of Birth: 11/29/1975           MRN: [id number] Visit Date: 12/05/2024              Requested by: Antonetta Rollene BRAVO, MD [address], [address] [place],  [place] [address] PCP: Antonetta Rollene BRAVO, MD   Assessment & Plan: Visit Diagnoses:  1. Primary osteoarthritis of left knee     Plan: Impression is severe left knee degenerative joint disease secondary to Osteoarthritis.  Patient has attempted conservative treatment for at least 6 consecutive weeks within the past 12 weeks, including but not limited to physical therapy, home exercise program, NSAIDs, activity modification, and/or corticosteroid injections. Despite these efforts, symptoms have not improved or have worsened. Conservative measures have been deemed unsuccessful at this time. After a detailed discussion covering diagnosis and treatment options--including the risks, benefits, alternatives, and potential complications of surgical and nonsurgical management--the patient elected to proceed with surgery  Anticoagulants: No antithrombotic Postop anticoagulation: Eliquis Diabetic: No  Nickel allergy: No Prior DVT/PE: No Tobacco use: No Clearances needed for surgery: has been obtained from PCP Anticipated discharge dispo: Home   Follow-Up Instructions: No follow-ups on file.   Orders:  No orders of the defined types were placed in this encounter.  No orders of the defined types were placed in this encounter.     Procedures: No procedures performed   Clinical Data: No additional findings.   Subjective: Chief Complaint  Patient presents with   Left Hip - Pain    HPI Patient returns today for follow-up evaluation of left knee severe osteoarthritis.    Review of Systems  Constitutional: Negative.   HENT: Negative.    Eyes: Negative.   Respiratory: Negative.    Cardiovascular: Negative.   Endocrine: Negative.   Musculoskeletal: Negative.    Neurological: Negative.   Hematological: Negative.   Psychiatric/Behavioral: Negative.    All other systems reviewed and are negative.    Objective: Vital Signs: Ht 5' 5.75 (1.67 m)   Wt 233 lb (105.7 kg)   BMI 37.89 kg/m   Physical Exam Vitals and nursing note reviewed.  Constitutional:      Appearance: She is well-developed.  HENT:     Head: Atraumatic.     Nose: Nose normal.  Eyes:     Extraocular Movements: Extraocular movements intact.  Cardiovascular:     Pulses: Normal pulses.  Pulmonary:     Effort: Pulmonary effort is normal.  Abdominal:     Palpations: Abdomen is soft.  Musculoskeletal:     Cervical back: Neck supple.  Skin:    General: Skin is warm.     Capillary Refill: Capillary refill takes less than 2 seconds.  Neurological:     Mental Status: She is alert. Mental status is at baseline.  Psychiatric:        Behavior: Behavior normal.        Thought Content: Thought content normal.        Judgment: Judgment normal.     Ortho Exam Examination of the left knee shows varus deformity.  Severe pain with range of motion.  Collaterals and cruciates are stable.  Baseline range of motion.  Medial joint line tenderness. Specialty Comments:  No specialty comments available.  Imaging: No results found.   PMFS History: Patient Active Problem List   Diagnosis Date Noted   Influenza vaccination  administered at current visit 10/05/2024   Nodule of buttock 10/04/2024   Primary osteoarthritis of right knee 10/03/2024   PCR positive for herpes simplex virus type 1 (HSV-1) DNA 06/17/2024   Immunization due 06/17/2024   Rash of mouth present on examination 06/17/2024   Irregular menses 04/05/2024   Routine cervical smear 11/10/2023   S/P gastric bypass 09/05/2023   Pre-op examination 07/29/2023   Mass of arm, right 10/22/2022   Vaginal lesion 10/22/2022   Depression, major, single episode, severe (HCC) 08/11/2022   PTSD (post-traumatic stress disorder)  08/11/2022   Binge eating disorder in remission 08/11/2022   Irritable bowel syndrome with both constipation and diarrhea 07/21/2022   OSA (obstructive sleep apnea) 07/05/2022   Trigger finger, right middle finger 07/02/2022   Colovesical fistula 12/08/2021    Class: Question of   Primary osteoarthritis of left knee 12/03/2021   Generalized arthritis 09/09/2021   Snoring 07/06/2021   Financial difficulties 05/09/2021   Reduced vision 04/03/2021   Hematochezia 09/09/2020   Right wrist pain 07/21/2020   Unsteady gait 07/14/2020   Poor urinary stream 05/27/2020   Environmental allergies 06/18/2019   Back pain 11/28/2017   Essential hypertension 03/22/2016   Diverticulosis 11/23/2015   Hemorrhoids 03/26/2015   Insomnia due to other mental disorder 07/10/2014   Generalized anxiety disorder with panic attacks 07/10/2014   FHx: cancer of digestive organ 02/16/2014   Piles (hemorrhoids) 02/14/2014   Allergic rhinitis 02/12/2013   Prediabetes 03/26/2012   Dyspepsia 03/22/2012   Pulmonary tuberculosis 11/02/2010   Alopecia (capitis) totalis 11/02/2010   Chronic fatigue 07/23/2009   Morbid obesity (HCC) 11/24/2007   Past Medical History:  Diagnosis Date   Abnormal vaginal Pap smear    Acid reflux    Allergy    Alopecia    Anxiety    Arthritis    Cough 09/13/2022   Depression    Depression, major, single episode, severe (HCC) 06/18/2019   pHQ 9 score of 18 in 05/2019, not suicidal or homicidal  PHQ 9 score of 13 in 03/2021  Score of 21 in 11/2021, score of 7 in 04/2022, score of 20 in 06/2022   Diverticulosis    Family history of diabetes mellitus    GERD (gastroesophageal reflux disease)    Hemorrhoids    History of recurrent UTIs    Hypertension    Obesity    Pre-diabetes    PTSD (post-traumatic stress disorder)    Sleep apnea    Mild OSA,  No CPAP   Tuberculosis 2005   treated, still tests positive    Family History  Adopted: Yes  Problem Relation Age of Onset    Hypertension Mother    Diabetes Mother    Other Mother        cholangiocarcinoma, age 62, deceased   Cancer Mother    Obesity Mother    Varicose Veins Mother    Breast cancer Maternal Aunt    Cancer Maternal Aunt    Diabetes Maternal Aunt    Stroke Maternal Uncle    Colon cancer Neg Hx    Tuberous sclerosis Neg Hx    Alpha-1 antitrypsin deficiency Neg Hx     Past Surgical History:  Procedure Laterality Date   APPENDECTOMY     BREAST BIOPSY Left 01/24/2023   BENIGN BREAST TISSUE WITH FAT NECROSIS, ADENOSIS AND CALCIFICATIONS//MM LT BREAST BX W LOC DEV 1ST LESION IMAGE BX SPEC STEREO GUIDE 01/24/2023 [facility]   COLONOSCOPY N/A 03/13/2014   Dr. Shaaron-  grade 3 hemorrhoids, colonic diverticulosis bx= benign lymphoid polyp   COLONOSCOPY WITH PROPOFOL  N/A 01/26/2021   Procedure: COLONOSCOPY WITH PROPOFOL ;  Surgeon: Shaaron Lamar HERO, MD;  [facility];  Service: Endoscopy;  Laterality: N/A;  AM   GASTRIC ROUX-EN-Y N/A 09/05/2023   Procedure: LAPAROSCOPIC ROUX-EN-Y GASTRIC BYPASS WITH UPPER ENDOSCOPY;  Surgeon: Tanda Locus, MD;  Location: [facility];  Service: General;  Laterality: N/A;   KNEE ARTHROSCOPY     right   POLYPECTOMY  01/26/2021   Procedure: POLYPECTOMY;  Surgeon: Shaaron Lamar HERO, MD;  [facility];  Service: Endoscopy;;   SMALL INTESTINE SURGERY  2024   Social History   Occupational History   Occupation: UNEMPLOYWED SINCE 10/2010  Tobacco Use   Smoking status: Never   Smokeless tobacco: Never  Vaping Use   Vaping status: Never Used  Substance and Sexual Activity   Alcohol use: No   Drug use: Never   Sexual activity: Yes    Birth control/protection: Pill        "

## 2024-12-05 NOTE — Care Plan (Signed)
 Ortho Bundle Case Management Note  Patient Details  Name: Jennifer [name] MRN: [id number] Date of Birth: 08/04/1976                  [facility] met with patient in office today to review her upcoming Left total knee arthroplasty with Dr. Jerri on 12/24/24 at [facility]. She was in office for a weight check and has met criteria <40 BMI to have surgery done. She is agreeable to case management. Her plan is to return home after surgery with family and friends assisting with care. She will need a RW. CPM has been ordered with [employer]. This will be delivered to her home. Anticipate HHPT will be needed after a short hospital stay. Referral made to [facility] after choice provided. Reviewed all post op care instructions and will continue to follow for needs.   DME Arranged:  CPM, Walker rolling DME Agency:  [facility], [employer]  HH Arranged:  PT HH Agency:  [facility]  Additional Comments: Please contact me with any questions of if this plan should need to change.  Tylene Ned, RN, BSN, [employer]  [phone] 12/05/2024, 1:34 PM

## 2024-12-12 NOTE — Progress Notes (Signed)
" ° °  12/12/2024  Patient ID: Jennifer [name], female   DOB: 11-Nov-1976, 49 y.o.   MRN: [id number]  Pharmacy Quality Measure Review  This patient is appearing on the insurance-providing list for being at risk of failing the adherence measure for Statin Use in Persons with Diabetes (SUPD) medications this calendar year.  Per review of chart and payor information, patient has filled a medication indicated for diabetes this calendar year but is not currently filling a statin prescription.   Type 2 DM w/ obesity noted in previous diagnosis. Low risk ASCVD - could consider CAC scoring to help guide decision, as previously stated by Catie CPP.   Lang Sieve, PharmD, BCGP Clinical Pharmacist  [phone] "

## 2024-12-18 ENCOUNTER — Other Ambulatory Visit: Payer: Self-pay | Admitting: Physician Assistant

## 2024-12-18 MED ORDER — METHOCARBAMOL 750 MG PO TABS: 750.0000 mg | ORAL_TABLET | Freq: Three times a day (TID) | ORAL | 2 refills | Status: AC | PRN

## 2024-12-18 MED ORDER — ONDANSETRON HCL 4 MG PO TABS: 4.0000 mg | ORAL_TABLET | Freq: Three times a day (TID) | ORAL | 0 refills | Status: AC | PRN

## 2024-12-18 MED ORDER — OXYCODONE-ACETAMINOPHEN 5-325 MG PO TABS: 1.0000 | ORAL_TABLET | Freq: Four times a day (QID) | ORAL | 0 refills | Status: AC | PRN

## 2024-12-18 MED ORDER — APIXABAN 2.5 MG PO TABS: ORAL_TABLET | ORAL | 0 refills | Status: AC

## 2024-12-18 MED ORDER — DOCUSATE SODIUM 100 MG PO CAPS: 100.0000 mg | ORAL_CAPSULE | Freq: Every day | ORAL | 2 refills | Status: AC | PRN

## 2024-12-19 NOTE — Pre-Procedure Instructions (Signed)
 Surgical Instructions   Your procedure is scheduled on December 24, 2024. Report to [facility] Main Entrance A at 7:45 A.M., then check in with the Admitting office. Any questions or running late day of surgery: call [phone]  Questions prior to your surgery date: call [phone], Monday-Friday, 8am-4pm. If you experience any cold or flu symptoms such as cough, fever, chills, shortness of breath, etc. between now and your scheduled surgery, please notify us  at the above number.     Remember:  Do not eat after midnight the night before your surgery  You may drink clear liquids until 7:15 AM the morning of your surgery.   Clear liquids allowed are: Water , Non-Citrus Juices (without pulp), Carbonated Beverages, Clear Tea (no milk, honey, etc.), Black Coffee Only (NO MILK, CREAM OR POWDERED CREAMER of any kind), and Gatorade.  Patient Instructions  The night before surgery:  No food after midnight. ONLY clear liquids after midnight   The day of surgery (if you have diabetes): Drink ONE (1) 12 oz G2 given to you in your pre admission testing appointment by 7:15 AM the morning of surgery. Drink in one sitting. Do not sip.  This drink was given to you during your hospital  pre-op appointment visit.  Nothing else to drink after completing the  12 oz bottle of G2.         If you have questions, please contact your surgeons office.    Take these medicines the morning of surgery with A SIP OF WATER : amLODipine  (NORVASC )  FLUoxetine  (PROZAC )  norethindrone  (MICRONOR )    May take these medicines IF NEEDED: fluticasone  (FLONASE ) nasal spray  pantoprazole  (PROTONIX )    One week prior to surgery, STOP taking any Aspirin (unless otherwise instructed by your surgeon) Aleve, Naproxen, Ibuprofen , Motrin , Advil , Goody's, BC's, all herbal medications, fish oil, and non-prescription vitamins.   WHAT DO I DO ABOUT MY DIABETES MEDICATION?   STOP taking your tirzepatide  (MOUNJARO ) one  week prior to surgery. Do not take any doses after February 1st.   HOW TO MANAGE YOUR DIABETES BEFORE AND AFTER SURGERY  Why is it important to control my blood sugar before and after surgery? Improving blood sugar levels before and after surgery helps healing and can limit problems. A way of improving blood sugar control is eating a healthy diet by:  Eating less sugar and carbohydrates  Increasing activity/exercise  Talking with your doctor about reaching your blood sugar goals High blood sugars (greater than 180 mg/dL) can raise your risk of infections and slow your recovery, so you will need to focus on controlling your diabetes during the weeks before surgery. Make sure that the doctor who takes care of your diabetes knows about your planned surgery including the date and location.  How do I manage my blood sugar before surgery? Check your blood sugar at least 4 times a day, starting 2 days before surgery, to make sure that the level is not too high or low.  Check your blood sugar the morning of your surgery when you wake up and every 2 hours until you get to the Short Stay unit.  If your blood sugar is less than 70 mg/dL, you will need to treat for low blood sugar: Do not take insulin . Treat a low blood sugar (less than 70 mg/dL) with  cup of clear juice (cranberry or apple), 4 glucose tablets, OR glucose gel. Recheck blood sugar in 15 minutes after treatment (to make sure it is greater than 70 mg/dL).  If your blood sugar is not greater than 70 mg/dL on recheck, call [phone] for further instructions. Report your blood sugar to the short stay nurse when you get to Short Stay.  If you are admitted to the hospital after surgery: Your blood sugar will be checked by the staff and you will probably be given insulin  after surgery (instead of oral diabetes medicines) to make sure you have good blood sugar levels. The goal for blood sugar control after surgery is 80-180 mg/dL.                       Do NOT Smoke (Tobacco/Vaping) for 24 hours prior to your procedure.  If you use a CPAP at night, you may bring your mask/headgear for your overnight stay.   You will be asked to remove any contacts, glasses, piercing's, hearing aid's, dentures/partials prior to surgery. Please bring cases for these items if needed.    Your surgeon will determine if you are to be admitted or discharged the same day.  Patients discharged the day of surgery will not be allowed to drive home, and someone needs to stay with them for 24 hours.  SURGICAL WAITING ROOM VISITATION Patients may have no more than 2 support people in the waiting area - these visitors may rotate.   Pre-op nurse will coordinate an appropriate time for 2 ADULT support persons, who may not rotate, to accompany patient in pre-op.  Children under the age of 80 must have an adult with them who is not the patient and must remain in the main waiting area with an adult.  If the patient needs to stay at the hospital during part of their recovery, the visitor guidelines for inpatient rooms apply.  Please refer to the [facility] website for the visitor guidelines for any additional information.   If you received a COVID test during your pre-op visit  it is requested that you wear a mask when out in public, stay away from anyone that may not be feeling well and notify your surgeon if you develop symptoms. If you have been in contact with anyone that has tested positive in the last 10 days please notify you surgeon.      Pre-operative 4 CHG Bathing Instructions   You can play a key role in reducing the risk of infection after surgery. Your skin needs to be as free of germs as possible. You can reduce the number of germs on your skin by washing with CHG (chlorhexidine  gluconate) soap before surgery. CHG is an antiseptic soap that kills germs and continues to kill germs even after washing.   DO NOT use if you have an allergy to  chlorhexidine /CHG or antibacterial soaps. If your skin becomes reddened or irritated, stop using the CHG and notify one of our RNs at [phone].   Please shower with the CHG soap starting 4 days before surgery using the following schedule:     Please keep in mind the following:  DO NOT shave, including legs and underarms, starting the day of your first shower.   You may shave your face at any point before/day of surgery.  Place clean sheets on your bed the day you start using CHG soap. Use a clean washcloth (not used since being washed) for each shower. DO NOT sleep with pets once you start using the CHG.   CHG Shower Instructions:  Wash your face and private area with normal soap. If you choose to wash your  hair, wash first with your normal shampoo.  After you use shampoo/soap, rinse your hair and body thoroughly to remove shampoo/soap residue.  Turn the water  OFF and apply  bottle of CHG soap to a CLEAN washcloth.  Apply CHG soap ONLY FROM YOUR NECK DOWN TO YOUR TOES (washing for 3-5 minutes)  DO NOT use CHG soap on face, private areas, open wounds, or sores.  Pay special attention to the area where your surgery is being performed.  If you are having back surgery, having someone wash your back for you may be helpful. Wait 2 minutes after CHG soap is applied, then you may rinse off the CHG soap.  Pat dry with a clean towel  Put on clean clothes/pajamas   If you choose to wear lotion, please use ONLY the CHG-compatible lotions that are listed below.  Additional instructions for the day of surgery:  If you choose, you may shower the morning of surgery with an antibacterial soap.  DO NOT APPLY any lotions, deodorants, cologne, or perfumes.   Do not bring valuables to the hospital. [facility] is not responsible for any belongings/valuables. Do not wear nail polish, gel polish, artificial nails, or any other type of covering on natural nails (fingers and toes) Do not wear jewelry or  makeup Put on clean/comfortable clothes.  Please brush your teeth.  Ask your nurse before applying any prescription medications to the skin.     CHG Compatible Lotions   Aveeno Moisturizing lotion  Cetaphil Moisturizing Cream  Cetaphil Moisturizing Lotion  Clairol Herbal Essence Moisturizing Lotion, Dry Skin  Clairol Herbal Essence Moisturizing Lotion, Extra Dry Skin  Clairol Herbal Essence Moisturizing Lotion, Normal Skin  Curel Age Defying Therapeutic Moisturizing Lotion with Alpha Hydroxy  Curel Extreme Care Body Lotion  Curel Soothing Hands Moisturizing Hand Lotion  Curel Therapeutic Moisturizing Cream, Fragrance-Free  Curel Therapeutic Moisturizing Lotion, Fragrance-Free  Curel Therapeutic Moisturizing Lotion, Original Formula  Eucerin Daily Replenishing Lotion  Eucerin Dry Skin Therapy Plus Alpha Hydroxy Crme  Eucerin Dry Skin Therapy Plus Alpha Hydroxy Lotion  Eucerin Original Crme  Eucerin Original Lotion  Eucerin Plus Crme Eucerin Plus Lotion  Eucerin TriLipid Replenishing Lotion  Keri Anti-Bacterial Hand Lotion  Keri Deep Conditioning Original Lotion Dry Skin Formula Softly Scented  Keri Deep Conditioning Original Lotion, Fragrance Free Sensitive Skin Formula  Keri Lotion Fast Absorbing Fragrance Free Sensitive Skin Formula  Keri Lotion Fast Absorbing Softly Scented Dry Skin Formula  Keri Original Lotion  Keri Skin Renewal Lotion Keri Silky Smooth Lotion  Keri Silky Smooth Sensitive Skin Lotion  Nivea Body Creamy Conditioning Oil  Nivea Body Extra Enriched Lotion  Nivea Body Original Lotion  Nivea Body Sheer Moisturizing Lotion Nivea Crme  Nivea Skin Firming Lotion  NutraDerm 30 Skin Lotion  NutraDerm Skin Lotion  NutraDerm Therapeutic Skin Cream  NutraDerm Therapeutic Skin Lotion  ProShield Protective Hand Cream  Provon moisturizing lotion  Please read over the following fact sheets that you were given.

## 2024-12-20 ENCOUNTER — Inpatient Hospital Stay (HOSPITAL_COMMUNITY)
Admission: RE | Admit: 2024-12-20 | Discharge: 2024-12-20 | Attending: Orthopaedic Surgery | Admitting: Orthopaedic Surgery

## 2024-12-20 ENCOUNTER — Other Ambulatory Visit: Payer: Self-pay

## 2024-12-20 ENCOUNTER — Encounter (HOSPITAL_COMMUNITY): Payer: Self-pay

## 2024-12-20 VITALS — BP 120/80 | HR 83 | Temp 97.7°F | Resp 18 | Ht 68.0 in | Wt 233.9 lb

## 2024-12-20 DIAGNOSIS — M1711 Unilateral primary osteoarthritis, right knee: Secondary | ICD-10-CM

## 2024-12-20 DIAGNOSIS — R7303 Prediabetes: Secondary | ICD-10-CM

## 2024-12-20 DIAGNOSIS — Z01818 Encounter for other preprocedural examination: Secondary | ICD-10-CM

## 2024-12-20 LAB — CBC
HCT: 41.7 % (ref 36.0–46.0)
Hemoglobin: 14.3 g/dL (ref 12.0–15.0)
MCH: 31.3 pg (ref 26.0–34.0)
MCHC: 34.3 g/dL (ref 30.0–36.0)
MCV: 91.2 fL (ref 80.0–100.0)
Platelets: 331 10*3/uL (ref 150–400)
RBC: 4.57 MIL/uL (ref 3.87–5.11)
RDW: 13.4 % (ref 11.5–15.5)
WBC: 6.4 10*3/uL (ref 4.0–10.5)
nRBC: 0 % (ref 0.0–0.2)

## 2024-12-20 LAB — BASIC METABOLIC PANEL WITH GFR
Anion gap: 13 (ref 5–15)
BUN: 7 mg/dL (ref 6–20)
CO2: 23 mmol/L (ref 22–32)
Calcium: 9.1 mg/dL (ref 8.9–10.3)
Chloride: 102 mmol/L (ref 98–111)
Creatinine, Ser: 0.74 mg/dL (ref 0.44–1.00)
GFR, Estimated: >60 mL/min
Glucose, Bld: 76 mg/dL (ref 70–99)
Potassium: 4.2 mmol/L (ref 3.5–5.1)
Sodium: 138 mmol/L (ref 135–145)

## 2024-12-20 LAB — HEMOGLOBIN A1C
Hgb A1c MFr Bld: 5.2 % (ref 4.8–5.6)
Mean Plasma Glucose: 102.54 mg/dL

## 2024-12-20 LAB — SURGICAL PCR SCREEN
MRSA, PCR: NEGATIVE
Staphylococcus aureus: POSITIVE — AB

## 2024-12-20 NOTE — Progress Notes (Signed)
 PCP - Dr. Rollene Pesa Cardiologist - Dr. Ronal Ross, LOV 2023.   PPM/ICD - denies Device Orders - na Rep Notified - na  Chest x-ray - 10/04/2024 EKG - 10/04/2024 Stress Test -  ECHO -  Cardiac Cath -   Sleep Study - mild sleep apnes CPAP - denies  Pre-diabetic, A1C drawn at PAT appointment Fasting Blood Sugar : 90-100 Checks Blood Sugar: daily  Last dose of GLP1 agonist- Mounjaro  GLP1 instructions: hold 7 days, last dose 12/13/2024  Blood Thinner Instructions: denies Aspirin Instructions:denies  ERAS Protcol - G2 until 0645  Anesthesia review: No  Patient denies shortness of breath, fever, cough and chest pain at PAT appointment   All instructions explained to the patient, with a verbal understanding of the material. Patient agrees to go over the instructions while at home for a better understanding. Patient also instructed to self quarantine after being tested for COVID-19. The opportunity to ask questions was provided.

## 2024-12-21 MED ORDER — TRANEXAMIC ACID 1000 MG/10ML IV SOLN
2000.0000 mg | INTRAVENOUS | Status: AC
  Filled 2024-12-21: qty 20

## 2024-12-24 ENCOUNTER — Encounter (HOSPITAL_COMMUNITY): Admission: RE | Payer: Self-pay | Source: Home / Self Care

## 2024-12-24 ENCOUNTER — Ambulatory Visit (HOSPITAL_COMMUNITY): Admission: RE | Admit: 2024-12-24 | Source: Home / Self Care | Admitting: Orthopaedic Surgery

## 2024-12-24 DIAGNOSIS — M1711 Unilateral primary osteoarthritis, right knee: Secondary | ICD-10-CM

## 2025-01-08 ENCOUNTER — Encounter: Admitting: Physician Assistant

## 2025-02-01 ENCOUNTER — Ambulatory Visit: Admitting: Family Medicine
